# Patient Record
Sex: Male | Born: 1945 | Race: White | Hispanic: No | Marital: Married | Smoking: Former smoker
Health system: Southern US, Community
[De-identification: ages and names within clinical notes are randomized; demographics above are authoritative.]

## PROBLEM LIST (undated history)

## (undated) ENCOUNTER — Emergency Department (HOSPITAL_COMMUNITY): Discharge status: Against Medical Advice | Payer: Self-pay

## (undated) DIAGNOSIS — I2510 Atherosclerotic heart disease of native coronary artery without angina pectoris: Secondary | ICD-10-CM

## (undated) DIAGNOSIS — Z8719 Personal history of other diseases of the digestive system: Secondary | ICD-10-CM

## (undated) DIAGNOSIS — F0390 Unspecified dementia without behavioral disturbance: Secondary | ICD-10-CM

## (undated) DIAGNOSIS — D649 Anemia, unspecified: Secondary | ICD-10-CM

## (undated) DIAGNOSIS — Z8711 Personal history of peptic ulcer disease: Secondary | ICD-10-CM

## (undated) DIAGNOSIS — I255 Ischemic cardiomyopathy: Secondary | ICD-10-CM

## (undated) DIAGNOSIS — K219 Gastro-esophageal reflux disease without esophagitis: Secondary | ICD-10-CM

## (undated) HISTORY — PX: FRACTURE SURGERY: SHX138

## (undated) SURGERY — Surgical Case
Anesthesia: *Unknown

---

## 1983-03-21 HISTORY — PX: FOREARM FRACTURE SURGERY: SHX649

## 2006-07-03 ENCOUNTER — Ambulatory Visit: Payer: Self-pay | Admitting: Gastroenterology

## 2008-02-12 ENCOUNTER — Ambulatory Visit: Payer: Self-pay | Admitting: Internal Medicine

## 2008-02-15 ENCOUNTER — Ambulatory Visit: Payer: Self-pay | Admitting: Internal Medicine

## 2010-03-16 ENCOUNTER — Ambulatory Visit: Payer: Self-pay | Admitting: Internal Medicine

## 2010-09-13 ENCOUNTER — Ambulatory Visit: Payer: Self-pay | Admitting: Internal Medicine

## 2014-09-24 ENCOUNTER — Other Ambulatory Visit: Payer: Self-pay | Admitting: Internal Medicine

## 2014-09-24 DIAGNOSIS — R413 Other amnesia: Secondary | ICD-10-CM

## 2014-10-01 ENCOUNTER — Ambulatory Visit
Admission: RE | Admit: 2014-10-01 | Discharge: 2014-10-01 | Disposition: A | Discharge status: Home / Self Care | Payer: Commercial Managed Care - HMO | Source: Ambulatory Visit | Attending: Internal Medicine | Admitting: Internal Medicine

## 2014-10-01 DIAGNOSIS — G939 Disorder of brain, unspecified: Secondary | ICD-10-CM | POA: Insufficient documentation

## 2014-10-01 DIAGNOSIS — R413 Other amnesia: Secondary | ICD-10-CM | POA: Diagnosis present

## 2014-10-01 DIAGNOSIS — I6501 Occlusion and stenosis of right vertebral artery: Secondary | ICD-10-CM | POA: Insufficient documentation

## 2014-10-01 DIAGNOSIS — I739 Peripheral vascular disease, unspecified: Secondary | ICD-10-CM | POA: Diagnosis not present

## 2014-10-01 MED ORDER — GADOBENATE DIMEGLUMINE 529 MG/ML IV SOLN
20.0000 mL | Freq: Once | INTRAVENOUS | Status: AC | PRN
  Administered 2014-10-01: 18 mL via INTRAVENOUS

## 2014-10-16 ENCOUNTER — Ambulatory Visit (INDEPENDENT_AMBULATORY_CARE_PROVIDER_SITE_OTHER): Payer: Commercial Managed Care - HMO | Admitting: Urology

## 2014-10-16 VITALS — BP 160/89 | HR 77 | Ht 71.0 in | Wt 200.1 lb

## 2014-10-16 DIAGNOSIS — R312 Other microscopic hematuria: Secondary | ICD-10-CM

## 2014-10-16 DIAGNOSIS — R3129 Other microscopic hematuria: Secondary | ICD-10-CM

## 2014-10-16 DIAGNOSIS — R8290 Unspecified abnormal findings in urine: Secondary | ICD-10-CM

## 2014-10-16 NOTE — Progress Notes (Addendum)
H&P  Chief Complaint: Blood in urine   History of Present Illness: 69 year old referred for possible microscopic hematuria. He underwent a urinalysis with his primary care physician which showed positive blood on the tip but 0-3 red cells per high-powered field confirming no microscopic hematuria. He had a few bacteria in the urine but was not having any tissue area. He's never had gross hematuria. Exposure risk of remote smoking. Patient has no l bothersome ower urinary tract, weight loss or fatigue.  He denies prior urologic history. No history of kidney stones. No flank pain.  Prostate cancer screening-patient's father had prostate cancer. Patient recent PSA was 0.6.  History reviewed. No pertinent past medical history. Past Surgical History  Procedure Laterality Date  . Arm injury repair Right 1985    Home Medications:   (Not in a hospital admission) Allergies: No Known Allergies  Family History  Problem Relation Age of Onset  . Prostate cancer Father   . Bladder Cancer Neg Hx   . Kidney cancer Neg Hx    Social History:  reports that he has quit smoking. He does not have any smokeless tobacco history on file. He reports that he does not drink alcohol or use illicit drugs.  ROS: A complete review of systems was performed.  All systems are negative except for pertinent findings as noted. ROS  Positive for easy bruising, occasional back pain   Physical Exam:  Vital signs in last 24 hours: @ General:  Alert and oriented, No acute distress HEENT: Normocephalic, atraumatic Neck: No JVD or lymphadenopathy Cardiovascular: Regular rate and rhythm Lungs: Regular rate and effort Abdomen: Soft, nontender, nondistended, no abdominal masses Back: No CVA tenderness Extremities: No edema Neurologic: Grossly intact GU: Penis was normal without mass or lesion, the testicles were palpably normal. On digital rectal exam the prostate was smooth without hard area or nodules. He  had mild BPH.  Laboratory Data:  No results found for this or any previous visit (from the past 24 hour(s)). No results found for this or any previous visit (from the past 240 hour(s)). Creatinine: No results for input(s): CREATININE in the last 168 hours.  Impression/Assessment:  Abnormal urinalysis-false positive on dipstick. No microscopic hematuria. Repeat urinalysis today also showed 0-2 red blood cells per high-powered field which is normal.  Prostate cancer screening-normal PSA. DRE normal.  Plan:  -Patient may follow-up as needed.  Garett Tetzloff 10/16/2014, 3:39 PM

## 2014-10-17 LAB — URINALYSIS, COMPLETE
Bilirubin, UA: NEGATIVE
Glucose, UA: NEGATIVE
Ketones, UA: NEGATIVE
Leukocytes, UA: NEGATIVE
Nitrite, UA: NEGATIVE
Protein, UA: NEGATIVE
SPEC GRAV UA: 1.01 (ref 1.005–1.030)
UUROB: 0.2 mg/dL (ref 0.2–1.0)
pH, UA: 7 (ref 5.0–7.5)

## 2014-10-17 LAB — MICROSCOPIC EXAMINATION
Bacteria, UA: NONE SEEN
EPITHELIAL CELLS (NON RENAL): NONE SEEN /HPF (ref 0–10)
Renal Epithel, UA: NONE SEEN /hpf

## 2015-03-30 DIAGNOSIS — E538 Deficiency of other specified B group vitamins: Secondary | ICD-10-CM | POA: Diagnosis not present

## 2015-03-30 DIAGNOSIS — K625 Hemorrhage of anus and rectum: Secondary | ICD-10-CM | POA: Diagnosis not present

## 2015-03-30 DIAGNOSIS — R413 Other amnesia: Secondary | ICD-10-CM | POA: Diagnosis not present

## 2015-04-07 DIAGNOSIS — K625 Hemorrhage of anus and rectum: Secondary | ICD-10-CM | POA: Diagnosis not present

## 2015-04-07 DIAGNOSIS — K219 Gastro-esophageal reflux disease without esophagitis: Secondary | ICD-10-CM | POA: Diagnosis not present

## 2015-04-07 DIAGNOSIS — R1310 Dysphagia, unspecified: Secondary | ICD-10-CM | POA: Diagnosis not present

## 2015-05-05 ENCOUNTER — Encounter: Payer: Self-pay | Admitting: *Deleted

## 2015-05-06 ENCOUNTER — Ambulatory Visit: Payer: PPO | Admitting: Anesthesiology

## 2015-05-06 ENCOUNTER — Encounter
Admission: RE | Disposition: A | Discharge status: Home / Self Care | Payer: Self-pay | Source: Ambulatory Visit | Attending: Gastroenterology

## 2015-05-06 ENCOUNTER — Encounter: Payer: Self-pay | Admitting: Anesthesiology

## 2015-05-06 ENCOUNTER — Ambulatory Visit
Admission: RE | Admit: 2015-05-06 | Discharge: 2015-05-06 | Disposition: A | Discharge status: Home / Self Care | Payer: PPO | Source: Ambulatory Visit | Attending: Gastroenterology | Admitting: Gastroenterology

## 2015-05-06 DIAGNOSIS — Z87891 Personal history of nicotine dependence: Secondary | ICD-10-CM | POA: Insufficient documentation

## 2015-05-06 DIAGNOSIS — K625 Hemorrhage of anus and rectum: Secondary | ICD-10-CM | POA: Diagnosis not present

## 2015-05-06 DIAGNOSIS — M1990 Unspecified osteoarthritis, unspecified site: Secondary | ICD-10-CM | POA: Insufficient documentation

## 2015-05-06 DIAGNOSIS — R1310 Dysphagia, unspecified: Secondary | ICD-10-CM | POA: Diagnosis not present

## 2015-05-06 DIAGNOSIS — K210 Gastro-esophageal reflux disease with esophagitis: Secondary | ICD-10-CM | POA: Diagnosis not present

## 2015-05-06 DIAGNOSIS — K529 Noninfective gastroenteritis and colitis, unspecified: Secondary | ICD-10-CM | POA: Diagnosis not present

## 2015-05-06 DIAGNOSIS — K222 Esophageal obstruction: Secondary | ICD-10-CM | POA: Diagnosis not present

## 2015-05-06 DIAGNOSIS — K6389 Other specified diseases of intestine: Secondary | ICD-10-CM | POA: Insufficient documentation

## 2015-05-06 DIAGNOSIS — K5190 Ulcerative colitis, unspecified, without complications: Secondary | ICD-10-CM | POA: Diagnosis not present

## 2015-05-06 DIAGNOSIS — J45909 Unspecified asthma, uncomplicated: Secondary | ICD-10-CM | POA: Insufficient documentation

## 2015-05-06 DIAGNOSIS — K5289 Other specified noninfective gastroenteritis and colitis: Secondary | ICD-10-CM | POA: Diagnosis not present

## 2015-05-06 DIAGNOSIS — Z79899 Other long term (current) drug therapy: Secondary | ICD-10-CM | POA: Diagnosis not present

## 2015-05-06 DIAGNOSIS — K209 Esophagitis, unspecified: Secondary | ICD-10-CM | POA: Diagnosis not present

## 2015-05-06 DIAGNOSIS — K208 Other esophagitis: Secondary | ICD-10-CM | POA: Diagnosis not present

## 2015-05-06 HISTORY — PX: COLONOSCOPY WITH PROPOFOL: SHX5780

## 2015-05-06 HISTORY — DX: Gastro-esophageal reflux disease without esophagitis: K21.9

## 2015-05-06 HISTORY — PX: ESOPHAGOGASTRODUODENOSCOPY (EGD) WITH PROPOFOL: SHX5813

## 2015-05-06 SURGERY — COLONOSCOPY WITH PROPOFOL
Anesthesia: General

## 2015-05-06 MED ORDER — LIDOCAINE HCL (CARDIAC) 20 MG/ML IV SOLN
INTRAVENOUS | Status: DC | PRN
Start: 2015-05-06 — End: 2015-05-06
  Administered 2015-05-06: 30 mg via INTRAVENOUS

## 2015-05-06 MED ORDER — GLYCOPYRROLATE 0.2 MG/ML IJ SOLN
INTRAMUSCULAR | Status: DC | PRN
  Administered 2015-05-06: 0.1 mg via INTRAVENOUS

## 2015-05-06 MED ORDER — SODIUM CHLORIDE 0.9 % IV SOLN
INTRAVENOUS | Status: DC
  Administered 2015-05-06: 1000 mL via INTRAVENOUS

## 2015-05-06 MED ORDER — LIDOCAINE HCL (PF) 1 % IJ SOLN
2.0000 mL | Freq: Once | INTRAMUSCULAR | Status: AC
  Administered 2015-05-06: 0.03 mL via INTRADERMAL

## 2015-05-06 MED ORDER — SODIUM CHLORIDE 0.9 % IV SOLN
INTRAVENOUS | Status: DC
  Administered 2015-05-06: 08:00:00 via INTRAVENOUS

## 2015-05-06 MED ORDER — PROPOFOL 500 MG/50ML IV EMUL
INTRAVENOUS | Status: DC | PRN
  Administered 2015-05-06: 140 ug/kg/min via INTRAVENOUS

## 2015-05-06 MED ORDER — FENTANYL CITRATE (PF) 100 MCG/2ML IJ SOLN
INTRAMUSCULAR | Status: DC | PRN
  Administered 2015-05-06: 50 ug via INTRAVENOUS

## 2015-05-06 MED ORDER — LIDOCAINE HCL (PF) 1 % IJ SOLN
INTRAMUSCULAR | Status: AC
  Administered 2015-05-06: 0.03 mL via INTRADERMAL
  Filled 2015-05-06: qty 2

## 2015-05-06 MED ORDER — MIDAZOLAM HCL 2 MG/2ML IJ SOLN
INTRAMUSCULAR | Status: DC | PRN
  Administered 2015-05-06: 1 mg via INTRAVENOUS

## 2015-05-06 NOTE — Op Note (Signed)
[facility] Gastroenterology Patient Name: Jeff Key Procedure Date: 05/06/2015 9:30 AM MRN: [id number] Account #: [id number] Date of Birth: November 25, 1945 Admit Type: Outpatient Age: 70 Room: [facility] ENDO ROOM 4 Gender: Male Note Status: Finalized Procedure:            Colonoscopy Indications:          Rectal bleeding Providers:            Ezzard Standing. Bluford Kaufmann, MD Referring MD:         [name], MD (Referring MD) Medicines:            Monitored Anesthesia Care Complications:        No immediate complications. Procedure:            Pre-Anesthesia Assessment:                       - Prior to the procedure, a History and Physical was                        performed, and patient medications, allergies and                        sensitivities were reviewed. The patient's tolerance of                        previous anesthesia was reviewed.                       - The risks and benefits of the procedure and the                        sedation options and risks were discussed with the                        patient. All questions were answered and informed                        consent was obtained.                       - After reviewing the risks and benefits, the patient                        was deemed in satisfactory condition to undergo the                        procedure.                       After obtaining informed consent, the colonoscope was                        passed under direct vision. Throughout the procedure,                        the patient's blood pressure, pulse, and oxygen                        saturations were monitored continuously. The Olympus                        CF-H180AL colonoscope (  S#: [id number] ) was introduced                        through the anus and advanced to the the cecum,                        identified by appendiceal orifice and ileocecal valve.                        The colonoscopy was performed with difficulty due to                         significant looping. Successful completion of the                        procedure was aided by changing the patient to a supine                        position. The patient tolerated the procedure well. The                        quality of the bowel preparation was good. Findings:      A diffuse area of moderately congested, erythematous, friable (with       contact bleeding), inflamed and vascular-pattern-decreased mucosa was       found from rectum to sigmoid colon. Inflammation extends to 40cm. More       noticeable in sigmoid area compared to rectum.      The exam was otherwise without abnormality.      Multiple biopsies taken from inflammed area. Impression:           - Congested, erythematous, friable (with contact                        bleeding), inflamed and vascular-pattern-decreased                        mucosa from rectum to sigmoid colon.                       - The examination was otherwise normal.                       - No specimens collected. Recommendation:       - Discharge patient to home.                       - Await pathology results.                       - Continue present medications.                       - The findings and recommendations were discussed with                        the patient.                       - Start asacol 3 bid. F/U in GI office Procedure Code(s):    --- Professional ---  91478, Colonoscopy, flexible; diagnostic, including                        collection of specimen(s) by brushing or washing, when                        performed (separate procedure) Diagnosis Code(s):    --- Professional ---                       K92.2, Gastrointestinal hemorrhage, unspecified                       K52.9, Noninfective gastroenteritis and colitis,                        unspecified                       K63.89, Other specified diseases of intestine                       K62.5, Hemorrhage of anus and  rectum CPT copyright 2016 American Medical Association. All rights reserved. The codes documented in this report are preliminary and upon coder review may  be revised to meet current compliance requirements. Wallace Cullens, MD 05/06/2015 9:55:16 AM This report has been signed electronically. Number of Addenda: 0 Note Initiated On: 05/06/2015 9:30 AM Scope Withdrawal Time: 0 hours 2 minutes 56 seconds  Total Procedure Duration: 0 hours 9 minutes 47 seconds       [facility]

## 2015-05-06 NOTE — H&P (Signed)
    Primary Care Physician:  [name], MD Primary Gastroenterologist:  Dr. Bluford Kaufmann  Pre-Procedure History & Physical: HPI:  Jeff Key is a 70 y.o. male is here for an EGD/colonoscopy.  Past Medical History  Diagnosis Date  . Asthma   . GERD (gastroesophageal reflux disease)   . Arthritis     Past Surgical History  Procedure Laterality Date  . Arm injury repair Right 1985  . Fracture surgery      Prior to Admission medications   Medication Sig Start Date End Date Taking? Authorizing Provider  donepezil (ARICEPT) 5 MG tablet Take 5 mg by mouth at bedtime.   Yes Historical Provider, MD  hydrocortisone (ANUSOL-HC) 25 MG suppository Place 25 mg rectally 2 (two) times daily.   Yes Historical Provider, MD  omeprazole (PRILOSEC) 20 MG capsule Take 20 mg by mouth daily.   Yes Historical Provider, MD  donepezil (ARICEPT) 5 MG tablet Take by mouth. 09/24/14 09/24/15  Historical Provider, MD    Allergies as of 04/28/2015  . (No Known Allergies)    Family History  Problem Relation Age of Onset  . Prostate cancer Father   . Bladder Cancer Neg Hx   . Kidney cancer Neg Hx     Social History   Social History  . Marital Status: Married    Spouse Name: N/A  . Number of Children: N/A  . Years of Education: N/A   Occupational History  . Not on file.   Social History Main Topics  . Smoking status: Former Games developer  . Smokeless tobacco: Not on file  . Alcohol Use: No  . Drug Use: No  . Sexual Activity: Not on file   Other Topics Concern  . Not on file   Social History Narrative    Review of Systems: See HPI, otherwise negative ROS  Physical Exam: There were no vitals taken for this visit. General:   Alert,  pleasant and cooperative in NAD Head:  Normocephalic and atraumatic. Neck:  Supple; no masses or thyromegaly. Lungs:  Clear throughout to auscultation.    Heart:  Regular rate and rhythm. Abdomen:  Soft, nontender and nondistended. Normal bowel sounds, without  guarding, and without rebound.   Neurologic:  Alert and  oriented x4;  grossly normal neurologically.  Impression/Plan: MAOR MECKEL is here for an EGD/colonoscopy to be performed for rectal bleeding, GERD, and dysphagia.  Risks, benefits, limitations, and alternatives regarding  EGD/colonosocpy have been reviewed with the patient.  Questions have been answered.  All parties agreeable.   Rayanna Matusik, Ezzard Standing, MD  05/06/2015, 8:47 AM

## 2015-05-06 NOTE — Transfer of Care (Signed)
Immediate Anesthesia Transfer of Care Note  Patient: Jeff Key  Procedure(s) Performed: Procedure(s): COLONOSCOPY WITH PROPOFOL (N/A) ESOPHAGOGASTRODUODENOSCOPY (EGD) WITH PROPOFOL (N/A)  Patient Location: PACU  Anesthesia Type:General  Level of Consciousness: awake and sedated  Airway & Oxygen Therapy: Patient Spontanous Breathing and Patient connected to nasal cannula oxygen  Post-op Assessment: Report given to RN and Post -op Vital signs reviewed and stable  Post vital signs: Reviewed and stable  Last Vitals:  Filed Vitals:   05/06/15 0851  BP: 154/88  Pulse: 95  Temp: 36.6 C  Resp: 16    Complications: No apparent anesthesia complications

## 2015-05-06 NOTE — Anesthesia Postprocedure Evaluation (Signed)
Anesthesia Post Note  Patient: RAMI BUDHU  Procedure(s) Performed: Procedure(s) (LRB): COLONOSCOPY WITH PROPOFOL (N/A) ESOPHAGOGASTRODUODENOSCOPY (EGD) WITH PROPOFOL (N/A)  Patient location during evaluation: Endoscopy Anesthesia Type: General Level of consciousness: awake and alert Pain management: pain level controlled Vital Signs Assessment: post-procedure vital signs reviewed and stable Respiratory status: spontaneous breathing, nonlabored ventilation, respiratory function stable and patient connected to nasal cannula oxygen Cardiovascular status: blood pressure returned to baseline and stable Postop Assessment: no signs of nausea or vomiting Anesthetic complications: no    Last Vitals:  Filed Vitals:   05/06/15 1020 05/06/15 1030  BP: 135/93 135/93  Pulse: 69 73  Temp:    Resp: 14 17    Last Pain: There were no vitals filed for this visit.               Janes Colegrove S

## 2015-05-06 NOTE — Op Note (Signed)
[facility] Gastroenterology Patient Name: Jeff Key Procedure Date: 05/06/2015 9:31 AM MRN: [id number] Account #: [id number] Date of Birth: [birth year] Admit Type: Outpatient Age: 70 Room: [facility] ENDO ROOM 4 Gender: Male Note Status: Finalized Procedure:            Upper GI endoscopy Indications:          Dysphagia, Suspected esophageal reflux Providers:            Ezzard Standing. Bluford Kaufmann, MD Referring MD:         [name], MD (Referring MD) Medicines:            Monitored Anesthesia Care Complications:        No immediate complications. Procedure:            Pre-Anesthesia Assessment:                       - Prior to the procedure, a History and Physical was                        performed, and patient medications, allergies and                        sensitivities were reviewed. The patient's tolerance of                        previous anesthesia was reviewed.                       - The risks and benefits of the procedure and the                        sedation options and risks were discussed with the                        patient. All questions were answered and informed                        consent was obtained.                       - After reviewing the risks and benefits, the patient                        was deemed in satisfactory condition to undergo the                        procedure.                       After obtaining informed consent, the endoscope was                        passed under direct vision. Throughout the procedure,                        the patient's blood pressure, pulse, and oxygen                        saturations were monitored continuously. The Olympus  GIF-160 endoscope (S#. [id number]) was introduced through                        the mouth, and advanced to the second part of duodenum.                        The upper GI endoscopy was accomplished without                        difficulty. The  patient tolerated the procedure well. Findings:      LA Grade A (one or more mucosal breaks less than 5 mm, not extending       between tops of 2 mucosal folds) esophagitis was found at the       gastroesophageal junction. Biopsies were taken with a cold forceps for       histology.      One mild benign-appearing, intrinsic stenosis was found at the       gastroesophageal junction. And was traversed. The scope was withdrawn.       Dilation was performed with a Maloney dilator with mild resistance at 54       Fr.      The exam was otherwise without abnormality.      The entire examined stomach was normal.      The examined duodenum was normal. Impression:           - LA Grade A reflux esophagitis. Biopsied.                       - Benign-appearing esophageal stenosis. Dilated.                       - The examination was otherwise normal.                       - Normal stomach.                       - Normal examined duodenum. Recommendation:       - Discharge patient to home.                       - Observe patient's clinical course.                       - Continue present medications.                       - Await pathology results.                       - The findings and recommendations were discussed with                        the patient. Procedure Code(s):    --- Professional ---                       [phone], Esophagogastroduodenoscopy, flexible, transoral;                        with biopsy, single or multiple  43450, Dilation of esophagus, by unguided sound or                        bougie, single or multiple passes Diagnosis Code(s):    --- Professional ---                       K21.0, Gastro-esophageal reflux disease with esophagitis                       K22.2, Esophageal obstruction                       R13.10, Dysphagia, unspecified CPT copyright 2016 American Medical Association. All rights reserved. The codes documented in this report are  preliminary and upon coder review may  be revised to meet current compliance requirements. Wallace Cullens, MD 05/06/2015 9:40:08 AM This report has been signed electronically. Number of Addenda: 0 Note Initiated On: 05/06/2015 9:31 AM      [facility]

## 2015-05-06 NOTE — Anesthesia Procedure Notes (Signed)
Performed by: Ginger Carne Pre-anesthesia Checklist: Patient identified, Emergency Drugs available, Suction available, Patient being monitored and Timeout performed Patient Re-evaluated:Patient Re-evaluated prior to inductionOxygen Delivery Method: Nasal cannula Preoxygenation: Pre-oxygenation with 100% oxygen Intubation Type: IV induction Airway Equipment and Method: Bite block Placement Confirmation: positive ETCO2 and CO2 detector

## 2015-05-06 NOTE — Anesthesia Preprocedure Evaluation (Signed)
Anesthesia Evaluation  Patient identified by MRN, date of birth, ID band Patient awake    Reviewed: Allergy & Precautions, NPO status , Patient's Chart, lab work & pertinent test results, reviewed documented beta blocker date and time   Airway Mallampati: II  TM Distance: >3 FB     Dental  (+) Chipped   Pulmonary asthma , former smoker,           Cardiovascular      Neuro/Psych    GI/Hepatic GERD  ,  Endo/Other    Renal/GU      Musculoskeletal  (+) Arthritis ,   Abdominal   Peds  Hematology   Anesthesia Other Findings   Reproductive/Obstetrics                             Anesthesia Physical Anesthesia Plan  ASA: III  Anesthesia Plan: General   Post-op Pain Management:    Induction: Intravenous  Airway Management Planned: Nasal Cannula  Additional Equipment:   Intra-op Plan:   Post-operative Plan:   Informed Consent: I have reviewed the patients History and Physical, chart, labs and discussed the procedure including the risks, benefits and alternatives for the proposed anesthesia with the patient or authorized representative who has indicated his/her understanding and acceptance.     Plan Discussed with: CRNA  Anesthesia Plan Comments:         Anesthesia Quick Evaluation

## 2015-05-07 LAB — SURGICAL PATHOLOGY

## 2015-05-10 ENCOUNTER — Encounter: Payer: Self-pay | Admitting: Gastroenterology

## 2015-05-25 DIAGNOSIS — K51911 Ulcerative colitis, unspecified with rectal bleeding: Secondary | ICD-10-CM | POA: Diagnosis not present

## 2015-07-27 DIAGNOSIS — E538 Deficiency of other specified B group vitamins: Secondary | ICD-10-CM | POA: Diagnosis not present

## 2015-07-27 DIAGNOSIS — R413 Other amnesia: Secondary | ICD-10-CM | POA: Diagnosis not present

## 2015-07-27 DIAGNOSIS — J309 Allergic rhinitis, unspecified: Secondary | ICD-10-CM | POA: Diagnosis not present

## 2015-07-27 DIAGNOSIS — K51311 Ulcerative (chronic) rectosigmoiditis with rectal bleeding: Secondary | ICD-10-CM | POA: Diagnosis not present

## 2015-11-25 DIAGNOSIS — K219 Gastro-esophageal reflux disease without esophagitis: Secondary | ICD-10-CM | POA: Diagnosis not present

## 2015-11-25 DIAGNOSIS — K51911 Ulcerative colitis, unspecified with rectal bleeding: Secondary | ICD-10-CM | POA: Diagnosis not present

## 2015-11-25 DIAGNOSIS — K625 Hemorrhage of anus and rectum: Secondary | ICD-10-CM | POA: Diagnosis not present

## 2015-11-28 ENCOUNTER — Emergency Department: Payer: PPO

## 2015-11-28 ENCOUNTER — Encounter: Payer: Self-pay | Admitting: *Deleted

## 2015-11-28 ENCOUNTER — Inpatient Hospital Stay
Admission: EM | Admit: 2015-11-28 | Discharge: 2015-11-30 | DRG: 871 | Disposition: A | Discharge status: Home / Self Care | Payer: PPO | Attending: Internal Medicine | Admitting: Internal Medicine

## 2015-11-28 DIAGNOSIS — K219 Gastro-esophageal reflux disease without esophagitis: Secondary | ICD-10-CM | POA: Diagnosis present

## 2015-11-28 DIAGNOSIS — J189 Pneumonia, unspecified organism: Secondary | ICD-10-CM | POA: Diagnosis present

## 2015-11-28 DIAGNOSIS — Z79899 Other long term (current) drug therapy: Secondary | ICD-10-CM

## 2015-11-28 DIAGNOSIS — Z7982 Long term (current) use of aspirin: Secondary | ICD-10-CM

## 2015-11-28 DIAGNOSIS — G9340 Encephalopathy, unspecified: Secondary | ICD-10-CM | POA: Diagnosis not present

## 2015-11-28 DIAGNOSIS — Z8042 Family history of malignant neoplasm of prostate: Secondary | ICD-10-CM

## 2015-11-28 DIAGNOSIS — E871 Hypo-osmolality and hyponatremia: Secondary | ICD-10-CM | POA: Diagnosis not present

## 2015-11-28 DIAGNOSIS — R410 Disorientation, unspecified: Secondary | ICD-10-CM

## 2015-11-28 DIAGNOSIS — F0390 Unspecified dementia without behavioral disturbance: Secondary | ICD-10-CM | POA: Diagnosis present

## 2015-11-28 DIAGNOSIS — R0682 Tachypnea, not elsewhere classified: Secondary | ICD-10-CM | POA: Diagnosis not present

## 2015-11-28 DIAGNOSIS — R509 Fever, unspecified: Secondary | ICD-10-CM | POA: Diagnosis not present

## 2015-11-28 DIAGNOSIS — E860 Dehydration: Secondary | ICD-10-CM | POA: Diagnosis present

## 2015-11-28 DIAGNOSIS — R000 Tachycardia, unspecified: Secondary | ICD-10-CM | POA: Diagnosis present

## 2015-11-28 DIAGNOSIS — A419 Sepsis, unspecified organism: Principal | ICD-10-CM | POA: Diagnosis present

## 2015-11-28 DIAGNOSIS — J45909 Unspecified asthma, uncomplicated: Secondary | ICD-10-CM | POA: Diagnosis not present

## 2015-11-28 DIAGNOSIS — Z87891 Personal history of nicotine dependence: Secondary | ICD-10-CM

## 2015-11-28 DIAGNOSIS — M1990 Unspecified osteoarthritis, unspecified site: Secondary | ICD-10-CM | POA: Diagnosis not present

## 2015-11-28 LAB — CBC WITH DIFFERENTIAL/PLATELET
BASOS PCT: 1 %
Basophils Absolute: 0.1 10*3/uL (ref 0–0.1)
EOS PCT: 9 %
Eosinophils Absolute: 1.1 10*3/uL — ABNORMAL HIGH (ref 0–0.7)
HEMATOCRIT: 36.5 % — AB (ref 40.0–52.0)
Hemoglobin: 12.5 g/dL — ABNORMAL LOW (ref 13.0–18.0)
Lymphocytes Relative: 12 %
Lymphs Abs: 1.4 10*3/uL (ref 1.0–3.6)
MCH: 27.7 pg (ref 26.0–34.0)
MCHC: 34.1 g/dL (ref 32.0–36.0)
MCV: 81.3 fL (ref 80.0–100.0)
MONO ABS: 1.9 10*3/uL — AB (ref 0.2–1.0)
MONOS PCT: 16 %
NEUTROS ABS: 7.2 10*3/uL — AB (ref 1.4–6.5)
Neutrophils Relative %: 62 %
PLATELETS: 172 10*3/uL (ref 150–440)
RBC: 4.49 MIL/uL (ref 4.40–5.90)
RDW: 15.2 % — AB (ref 11.5–14.5)
WBC: 11.8 10*3/uL — ABNORMAL HIGH (ref 3.8–10.6)

## 2015-11-28 LAB — COMPREHENSIVE METABOLIC PANEL
ALBUMIN: 3.7 g/dL (ref 3.5–5.0)
ALK PHOS: 79 U/L (ref 38–126)
ALT: 14 U/L — AB (ref 17–63)
ANION GAP: 8 (ref 5–15)
AST: 16 U/L (ref 15–41)
BILIRUBIN TOTAL: 0.5 mg/dL (ref 0.3–1.2)
BUN: 12 mg/dL (ref 6–20)
CALCIUM: 8.6 mg/dL — AB (ref 8.9–10.3)
CO2: 26 mmol/L (ref 22–32)
Chloride: 92 mmol/L — ABNORMAL LOW (ref 101–111)
Creatinine, Ser: 0.97 mg/dL (ref 0.61–1.24)
GFR calc Af Amer: >60 mL/min (ref >60)
GFR calc non Af Amer: >60 mL/min (ref >60)
GLUCOSE: 107 mg/dL — AB (ref 65–99)
Potassium: 3.6 mmol/L (ref 3.5–5.1)
SODIUM: 126 mmol/L — AB (ref 135–145)
Total Protein: 8.1 g/dL (ref 6.5–8.1)

## 2015-11-28 LAB — LACTIC ACID, PLASMA: Lactic Acid, Venous: 0.9 mmol/L (ref 0.5–1.9)

## 2015-11-28 LAB — TROPONIN I

## 2015-11-28 MED ORDER — ACETAMINOPHEN 325 MG PO TABS
650.0000 mg | ORAL_TABLET | Freq: Once | ORAL | Status: AC
  Administered 2015-11-28: 650 mg via ORAL
  Filled 2015-11-28: qty 2

## 2015-11-28 MED ORDER — SODIUM CHLORIDE 0.9 % IV BOLUS (SEPSIS)
1000.0000 mL | Freq: Once | INTRAVENOUS | Status: AC
  Administered 2015-11-28: 1000 mL via INTRAVENOUS

## 2015-11-28 NOTE — ED Triage Notes (Signed)
Wife states pt is more confused for 2-3 days.  Pt also has unsteady gait for 2-3 days.  Pt denies h/a.  Nausea yesterday.

## 2015-11-28 NOTE — ED Provider Notes (Signed)
[facility] Emergency Department Provider Note   ____________________________________________   First MD Initiated Contact with Patient 11/28/15 2308     (approximate)  I have reviewed the triage vital signs and the nursing notes.   HISTORY  Chief Complaint Altered Mental Status and Code Sepsis    HPI Jeff Key is a 70 y.o. male who comes into the hospital today with a fever, mild confusion and being off balance. The patient's wife reports that he came home from church around 7:30 because he was acting a bit strange and he looked flushed. She reports that they took his temperature and it was 102 and then 101. He did not receive any medicine at home but came right to the hospital. His wife reports that he had some mild confusion that started on Friday. She thought it was due to his medication so she took him off of it. He started on some sulfasalazine for colitis which she had 6 months ago. The patient is also on Aricept for some forgetfulness but has no specific diagnosis of dementia. He's had a mild cough with no runny nose and some nausea yesterday morning. The patient has had no vomiting no diarrhea and no abdominal pain. He's been urinating well with no headache. The patient's wife reports that he has been slow to respond which is not normal for him. She was concerned so they brought him into the hospital for evaluation.   Past Medical History:  Diagnosis Date  . Arthritis   . Asthma   . GERD (gastroesophageal reflux disease)     Patient Active Problem List   Diagnosis Date Noted  . Abnormal urinalysis 10/16/2014    Past Surgical History:  Procedure Laterality Date  . ARM Injury Repair Right 1985  . COLONOSCOPY WITH PROPOFOL N/A 05/06/2015   Procedure: COLONOSCOPY WITH PROPOFOL;  Surgeon: Wallace Cullens, MD;  Location: [facility] ENDOSCOPY;  Service: Gastroenterology;  Laterality: N/A;  . ESOPHAGOGASTRODUODENOSCOPY (EGD) WITH PROPOFOL N/A 05/06/2015   Procedure: ESOPHAGOGASTRODUODENOSCOPY (EGD) WITH PROPOFOL;  Surgeon: Wallace Cullens, MD;  Location: [facility] ENDOSCOPY;  Service: Gastroenterology;  Laterality: N/A;  . FRACTURE SURGERY      Prior to Admission medications   Medication Sig Start Date End Date Taking? Authorizing Provider  aspirin EC 81 MG tablet Take 81 mg by mouth daily.   Yes Historical Provider, MD  donepezil (ARICEPT) 5 MG tablet Take 5 mg by mouth at bedtime.   Yes Historical Provider, MD  omeprazole (PRILOSEC) 20 MG capsule Take 20 mg by mouth daily.   Yes Historical Provider, MD  sulfaSALAzine (AZULFIDINE) 500 MG EC tablet Take 1,000 mg by mouth 2 (two) times daily.   Yes Historical Provider, MD  vitamin B-12 (CYANOCOBALAMIN) 1000 MCG tablet Take 1,000 mcg by mouth daily.   Yes Historical Provider, MD    Allergies Review of patient's allergies indicates no known allergies.  Family History  Problem Relation Age of Onset  . Prostate cancer Father   . Bladder Cancer Neg Hx   . Kidney cancer Neg Hx     Social History Social History  Substance Use Topics  . Smoking status: Former Games developer  . Smokeless tobacco: Never Used  . Alcohol use No    Review of Systems Constitutional:  fever/chills Eyes: No visual changes. ENT: No sore throat. Cardiovascular: Denies chest pain. Respiratory: mild cough Gastrointestinal: No abdominal pain.  No nausea, no vomiting.  No diarrhea.  No constipation. Genitourinary: Negative for dysuria. Musculoskeletal: Negative for  back pain. Skin: Negative for rash. Neurological: Negative for headaches, focal weakness or numbness.  10-point ROS otherwise negative.  ____________________________________________   PHYSICAL EXAM:  VITAL SIGNS: ED Triage Vitals  Enc Vitals Group     BP 11/28/15 2247 (!) 154/81     Pulse Rate 11/28/15 2247 83     Resp 11/28/15 2247 (!) 22     Temp 11/28/15 2247 (!) 102.2 F (39 C)     Temp Source 11/28/15 2247 Oral     SpO2 11/28/15 2247 97 %     Weight  11/28/15 2249 198 lb (89.8 kg)     Height 11/28/15 2249 5\' 11"  (1.803 m)     Head Circumference --      Peak Flow --      Pain Score --      Pain Loc --      Pain Edu? --      Excl. in GC? --     Constitutional: Alert and oriented. Well appearing and in mild distress. Eyes: Conjunctivae are normal. PERRL. EOMI. Head: Atraumatic. Nose: No congestion/rhinnorhea. Mouth/Throat: Mucous membranes are moist.  Oropharynx non-erythematous. Cardiovascular: Normal rate, regular rhythm. Grossly normal heart sounds.  Good peripheral circulation. Respiratory: Normal respiratory effort.  No retractions. crackles in upper lobes bilaterally Gastrointestinal: Soft and nontender. No distention. Positive bowel sounds Musculoskeletal: No lower extremity tenderness nor edema.   Neurologic:  Normal speech and language. Cranial nerves II through XII grossly intact with no focal motor or neuro deficits Skin:  Skin is warm, dry and intact.  Psychiatric: Mood and affect are normal.   ____________________________________________   LABS (all labs ordered are listed, but only abnormal results are displayed)  Labs Reviewed  COMPREHENSIVE METABOLIC PANEL - Abnormal; Notable for the following:       Result Value   Sodium 126 (*)    Chloride 92 (*)    Glucose, Bld 107 (*)    Calcium 8.6 (*)    ALT 14 (*)    All other components within normal limits  CBC WITH DIFFERENTIAL/PLATELET - Abnormal; Notable for the following:    WBC 11.8 (*)    Hemoglobin 12.5 (*)    HCT 36.5 (*)    RDW 15.2 (*)    Neutro Abs 7.2 (*)    Monocytes Absolute 1.9 (*)    Eosinophils Absolute 1.1 (*)    All other components within normal limits  CULTURE, BLOOD (ROUTINE X 2)  CULTURE, BLOOD (ROUTINE X 2)  URINE CULTURE  LACTIC ACID, PLASMA  TROPONIN I  LACTIC ACID, PLASMA  URINALYSIS COMPLETEWITH MICROSCOPIC ([facility] ONLY)   ____________________________________________  EKG  ED ECG REPORT I, Rebecka ApleyWebster,  Mylia Pondexter P, the  attending physician, personally viewed and interpreted this ECG.   Date: 11/28/2015  EKG Time: 2302  Rate: 83  Rhythm: normal sinus rhythm  Axis: normal  Intervals:none  ST&T Change: none  ____________________________________________  RADIOLOGY  CXR CT head ____________________________________________   PROCEDURES  Procedure(s) performed: None  Procedures  Critical Care performed: No  ____________________________________________   INITIAL IMPRESSION / ASSESSMENT AND PLAN / ED COURSE  Pertinent labs & imaging results that were available during my care of the patient were reviewed by me and considered in my medical decision making (see chart for details).  This is a 70 year old male who comes into the hospital today with a fever and some altered mental status. The patient was seen and had a code sepsis called on him on his arrival. The patient is  not tachycardic and his blood pressure is unremarkable so he is not in septic shock. We will check some blood work and give the patient a liter of normal saline and 650 of Tylenol. The patient has no focality at this point as to the cause of his fever I will continue to assess the patient.  Clinical Course  Value Comment By Time  DG Chest Port 1 View Questionable ill-defined vague upper lobe opacity. This may be pneumonia versus artifact. PA and lateral views recommended when patient is able.   Rebecka Apley, MD 09/11 [phone]  CT Head Wo Contrast No acute intracranial findings. There is moderate generalized atrophy and chronic appearing white matter hypodensities which likely represent small vessel ischemic disease.   Rebecka Apley, MD 09/11 [phone]   ED Sepsis - Repeat Assessment   Performed at:    11/29/15 0110  Last Vitals:    Blood pressure 135/68, pulse 80, temperature 98.6 F (37 C), temperature source Oral, resp. rate 12, height 5\' 11"  (1.803 m), weight 198 lb (89.8 kg), SpO2 94 %.  Heart:                 RRR no  m/r/g  Lungs:     Crackles in bilateral upper lobes  Capillary Refill:              Less than 2 seconds  Peripheral Pulse (include location): Radial 2+   Skin (include color):   Pink, warm  This is a 70 year old male who comes into the hospital today with some confusion and decreased balance. The patient also has some fever. It looks that the patient may have some pneumonia based on his chest x-ray. I will give him some ceftriaxone and azithromycin. I also gave him a liter of normal saline and I will admit him to the hospitalist service. ____________________________________________   FINAL CLINICAL IMPRESSION(S) / ED DIAGNOSES  Final diagnoses:  Disorientation  Fever, unspecified fever cause  Hyponatremia  Sepsis, due to unspecified organism [facility])      NEW MEDICATIONS STARTED DURING THIS VISIT:  New Prescriptions   No medications on file     Note:  This document was prepared using Dragon voice recognition software and may include unintentional dictation errors.    Rebecka Apley, MD 11/29/15 [phone]

## 2015-11-28 NOTE — ED Notes (Signed)
Patient transported to CT 

## 2015-11-29 DIAGNOSIS — E860 Dehydration: Secondary | ICD-10-CM | POA: Diagnosis not present

## 2015-11-29 DIAGNOSIS — Z8042 Family history of malignant neoplasm of prostate: Secondary | ICD-10-CM | POA: Diagnosis not present

## 2015-11-29 DIAGNOSIS — R410 Disorientation, unspecified: Secondary | ICD-10-CM | POA: Diagnosis not present

## 2015-11-29 DIAGNOSIS — Z7982 Long term (current) use of aspirin: Secondary | ICD-10-CM | POA: Diagnosis not present

## 2015-11-29 DIAGNOSIS — K219 Gastro-esophageal reflux disease without esophagitis: Secondary | ICD-10-CM | POA: Diagnosis not present

## 2015-11-29 DIAGNOSIS — R000 Tachycardia, unspecified: Secondary | ICD-10-CM | POA: Diagnosis not present

## 2015-11-29 DIAGNOSIS — M1990 Unspecified osteoarthritis, unspecified site: Secondary | ICD-10-CM | POA: Diagnosis not present

## 2015-11-29 DIAGNOSIS — Z87891 Personal history of nicotine dependence: Secondary | ICD-10-CM | POA: Diagnosis not present

## 2015-11-29 DIAGNOSIS — A419 Sepsis, unspecified organism: Secondary | ICD-10-CM | POA: Diagnosis present

## 2015-11-29 DIAGNOSIS — F0390 Unspecified dementia without behavioral disturbance: Secondary | ICD-10-CM | POA: Diagnosis not present

## 2015-11-29 DIAGNOSIS — R0682 Tachypnea, not elsewhere classified: Secondary | ICD-10-CM | POA: Diagnosis not present

## 2015-11-29 DIAGNOSIS — Z79899 Other long term (current) drug therapy: Secondary | ICD-10-CM | POA: Diagnosis not present

## 2015-11-29 DIAGNOSIS — E871 Hypo-osmolality and hyponatremia: Secondary | ICD-10-CM | POA: Diagnosis not present

## 2015-11-29 DIAGNOSIS — J189 Pneumonia, unspecified organism: Secondary | ICD-10-CM | POA: Diagnosis not present

## 2015-11-29 DIAGNOSIS — R509 Fever, unspecified: Secondary | ICD-10-CM | POA: Diagnosis not present

## 2015-11-29 DIAGNOSIS — G9340 Encephalopathy, unspecified: Secondary | ICD-10-CM | POA: Diagnosis not present

## 2015-11-29 DIAGNOSIS — J45909 Unspecified asthma, uncomplicated: Secondary | ICD-10-CM | POA: Diagnosis not present

## 2015-11-29 LAB — LACTIC ACID, PLASMA: Lactic Acid, Venous: 0.7 mmol/L (ref 0.5–1.9)

## 2015-11-29 LAB — URINALYSIS COMPLETE WITH MICROSCOPIC (ARMC ONLY)
BACTERIA UA: NONE SEEN
Bilirubin Urine: NEGATIVE
GLUCOSE, UA: NEGATIVE mg/dL
Ketones, ur: NEGATIVE mg/dL
Leukocytes, UA: NEGATIVE
Nitrite: NEGATIVE
PROTEIN: NEGATIVE mg/dL
SPECIFIC GRAVITY, URINE: 1.004 — AB (ref 1.005–1.030)
Squamous Epithelial / LPF: NONE SEEN
pH: 6 (ref 5.0–8.0)

## 2015-11-29 LAB — TSH: TSH: 1.361 u[IU]/mL (ref 0.350–4.500)

## 2015-11-29 LAB — HEMOGLOBIN A1C: Hgb A1c MFr Bld: 6.3 % — ABNORMAL HIGH (ref 4.0–6.0)

## 2015-11-29 MED ORDER — SULFASALAZINE 500 MG PO TBEC
1000.0000 mg | DELAYED_RELEASE_TABLET | Freq: Two times a day (BID) | ORAL | Status: DC
  Filled 2015-11-29 (×2): qty 2

## 2015-11-29 MED ORDER — DONEPEZIL HCL 5 MG PO TABS
5.0000 mg | ORAL_TABLET | Freq: Every day | ORAL | Status: DC
  Administered 2015-11-29: 5 mg via ORAL
  Filled 2015-11-29: qty 1

## 2015-11-29 MED ORDER — DOCUSATE SODIUM 100 MG PO CAPS
100.0000 mg | ORAL_CAPSULE | Freq: Two times a day (BID) | ORAL | Status: DC
  Administered 2015-11-29 – 2015-11-30 (×4): 100 mg via ORAL
  Filled 2015-11-29 (×4): qty 1

## 2015-11-29 MED ORDER — SODIUM CHLORIDE 0.9% FLUSH
3.0000 mL | Freq: Two times a day (BID) | INTRAVENOUS | Status: DC
  Administered 2015-11-29 (×2): 3 mL via INTRAVENOUS

## 2015-11-29 MED ORDER — CEFTRIAXONE SODIUM 1 G IJ SOLR
1.0000 g | INTRAMUSCULAR | Status: DC
  Administered 2015-11-29: 1 g via INTRAVENOUS
  Filled 2015-11-29 (×2): qty 10

## 2015-11-29 MED ORDER — DEXTROSE 5 % IV SOLN
1.0000 g | INTRAVENOUS | Status: DC
  Filled 2015-11-29: qty 10

## 2015-11-29 MED ORDER — ONDANSETRON HCL 4 MG/2ML IJ SOLN: 4.0000 mg | Freq: Four times a day (QID) | INTRAMUSCULAR | Status: DC | PRN

## 2015-11-29 MED ORDER — PANTOPRAZOLE SODIUM 40 MG PO TBEC
40.0000 mg | DELAYED_RELEASE_TABLET | Freq: Every day | ORAL | Status: DC
  Administered 2015-11-29 – 2015-11-30 (×2): 40 mg via ORAL
  Filled 2015-11-29 (×2): qty 1

## 2015-11-29 MED ORDER — DEXTROSE 5 % IV SOLN
1.0000 g | Freq: Once | INTRAVENOUS | Status: AC
  Administered 2015-11-29: 1 g via INTRAVENOUS
  Filled 2015-11-29: qty 10

## 2015-11-29 MED ORDER — ENOXAPARIN SODIUM 40 MG/0.4ML ~~LOC~~ SOLN
40.0000 mg | SUBCUTANEOUS | Status: DC
  Administered 2015-11-29: 40 mg via SUBCUTANEOUS
  Filled 2015-11-29: qty 0.4

## 2015-11-29 MED ORDER — ACETAMINOPHEN 325 MG PO TABS: 650.0000 mg | ORAL_TABLET | Freq: Four times a day (QID) | ORAL | Status: DC | PRN

## 2015-11-29 MED ORDER — OXYCODONE HCL 5 MG PO TABS: 5.0000 mg | ORAL_TABLET | ORAL | Status: DC | PRN

## 2015-11-29 MED ORDER — AZITHROMYCIN 250 MG PO TABS
250.0000 mg | ORAL_TABLET | Freq: Every day | ORAL | Status: DC
  Administered 2015-11-29 – 2015-11-30 (×2): 250 mg via ORAL
  Filled 2015-11-29 (×2): qty 1

## 2015-11-29 MED ORDER — ONDANSETRON HCL 4 MG PO TABS: 4.0000 mg | ORAL_TABLET | Freq: Four times a day (QID) | ORAL | Status: DC | PRN

## 2015-11-29 MED ORDER — SODIUM CHLORIDE 0.9 % IV SOLN
INTRAVENOUS | Status: DC
  Administered 2015-11-29 (×2): via INTRAVENOUS

## 2015-11-29 MED ORDER — VITAMIN B-12 1000 MCG PO TABS
1000.0000 ug | ORAL_TABLET | Freq: Every day | ORAL | Status: DC
  Administered 2015-11-29 – 2015-11-30 (×2): 1000 ug via ORAL
  Filled 2015-11-29 (×2): qty 1

## 2015-11-29 MED ORDER — AZITHROMYCIN 500 MG IV SOLR
500.0000 mg | Freq: Once | INTRAVENOUS | Status: AC
  Administered 2015-11-29: 500 mg via INTRAVENOUS
  Filled 2015-11-29: qty 500

## 2015-11-29 MED ORDER — ASPIRIN EC 81 MG PO TBEC
81.0000 mg | DELAYED_RELEASE_TABLET | Freq: Every day | ORAL | Status: DC
  Administered 2015-11-29 – 2015-11-30 (×2): 81 mg via ORAL
  Filled 2015-11-29 (×2): qty 1

## 2015-11-29 MED ORDER — ACETAMINOPHEN 650 MG RE SUPP: 650.0000 mg | Freq: Four times a day (QID) | RECTAL | Status: DC | PRN

## 2015-11-29 NOTE — Progress Notes (Signed)
[facility] [place] [facility]                                                                                                                                                                                            Patient Demographics   Jeff Key, is a 70 y.o. male, DOB - February 01, 1946, WUJ:[id number]  Admit date - 11/28/2015   Admitting Physician Arnaldo Natal, MD  Outpatient Primary MD for the patient is [name], MD   LOS - 0  Subjective:Called by nurse due to family requesting to speak to a physician Patient admitted earlier with fever and confusion His mental status is improved fevers also subsided     Review of Systems:   CONSTITUTIONAL: No documented fever. No fatigue, weakness. No weight gain, no weight loss.  EYES: No blurry or double vision.  ENT: No tinnitus. No postnasal drip. No redness of the oropharynx.  RESPIRATORY: No cough, no wheeze, no hemoptysis. No dyspnea.  CARDIOVASCULAR: No chest pain. No orthopnea. No palpitations. No syncope.  GASTROINTESTINAL: No nausea, no vomiting or diarrhea. No abdominal pain. No melena or hematochezia.  GENITOURINARY: No dysuria or hematuria.  ENDOCRINE: No polyuria or nocturia. No heat or cold intolerance.  HEMATOLOGY: No anemia. No bruising. No bleeding.  INTEGUMENTARY: No rashes. No lesions.  MUSCULOSKELETAL: No arthritis. No swelling. No gout.  NEUROLOGIC: No numbness, tingling, or ataxia. No seizure-type activity.  PSYCHIATRIC: No anxiety. No insomnia. No ADD.    Vitals:   Vitals:   11/29/15 0148 11/29/15 0254 11/29/15 0342 11/29/15 1321  BP: (!) 152/88 (!) 149/89 (!) 146/71 (!) 141/73  Pulse: 81 79 75 79  Resp: (!) 22 20 18 18   Temp:   98.1 F (36.7 C) 98.3 F (36.8 C)  TempSrc:   Oral Oral  SpO2: 97% 97% 98% 99%  Weight:   195 lb 12.8 oz (88.8 kg)   Height:   5\' 11"  (1.803 m)     Wt Readings from Last 3 Encounters:  11/29/15 195 lb 12.8 oz (88.8 kg)  05/06/15  180 lb (81.6 kg)  10/16/14 200 lb 1.6 oz (90.8 kg)     Intake/Output Summary (Last 24 hours) at 11/29/15 1408 Last data filed at 11/29/15 1200  Gross per 24 hour  Intake          1372.19 ml  Output             2800 ml  Net         -1427.81 ml    Physical Exam:   GENERAL: Pleasant-appearing in no apparent distress.  HEAD, EYES, EARS, NOSE AND  THROAT: Atraumatic, normocephalic. Extraocular muscles are intact. Pupils equal and reactive to light. Sclerae anicteric. No conjunctival injection. No oro-pharyngeal erythema.  NECK: Supple. There is no jugular venous distention. No bruits, no lymphadenopathy, no thyromegaly.  HEART: Regular rate and rhythm,. No murmurs, no rubs, no clicks.  LUNGS: Clear to auscultation bilaterally. No rales or rhonchi. No wheezes.  ABDOMEN: Soft, flat, nontender, nondistended. Has good bowel sounds. No hepatosplenomegaly appreciated.  EXTREMITIES: No evidence of any cyanosis, clubbing, or peripheral edema.  +2 pedal and radial pulses bilaterally.  NEUROLOGIC: The patient is alert, awake, and oriented x3 with no focal motor or sensory deficits appreciated bilaterally.  SKIN: Moist and warm with no rashes appreciated.  Psych: Not anxious, depressed LN: No inguinal LN enlargement    Antibiotics   Anti-infectives    Start     Dose/Rate Route Frequency Ordered Stop   11/29/15 2200  cefTRIAXone (ROCEPHIN) 1 g in dextrose 5 % 50 mL IVPB     1 g 100 mL/hr over 30 Minutes Intravenous Every 24 hours 11/29/15 1009     11/29/15 1200  cefTRIAXone (ROCEPHIN) 1 g in dextrose 5 % 50 mL IVPB  Status:  Discontinued     1 g 100 mL/hr over 30 Minutes Intravenous Every 24 hours 11/29/15 0337 11/29/15 1009   11/29/15 1000  azithromycin (ZITHROMAX) tablet 250 mg     250 mg Oral Daily 11/29/15 0337     11/29/15 0045  cefTRIAXone (ROCEPHIN) 1 g in dextrose 5 % 50 mL IVPB     1 g 100 mL/hr over 30 Minutes Intravenous  Once 11/29/15 0036 11/29/15 0150   11/29/15 0045   azithromycin (ZITHROMAX) 500 mg in dextrose 5 % 250 mL IVPB     500 mg 250 mL/hr over 60 Minutes Intravenous  Once 11/29/15 0036 11/29/15 0232      Medications   Scheduled Meds: . aspirin EC  81 mg Oral Daily  . azithromycin  250 mg Oral Daily  . cefTRIAXone (ROCEPHIN)  IV  1 g Intravenous Q24H  . docusate sodium  100 mg Oral BID  . donepezil  5 mg Oral QHS  . enoxaparin (LOVENOX) injection  40 mg Subcutaneous Q24H  . pantoprazole  40 mg Oral Daily  . sodium chloride flush  3 mL Intravenous Q12H  . vitamin B-12  1,000 mcg Oral Daily   Continuous Infusions: . sodium chloride 75 mL/hr at 11/29/15 1135   PRN Meds:.acetaminophen **OR** acetaminophen, ondansetron **OR** ondansetron (ZOFRAN) IV, oxyCODONE   Data Review:   Micro Results Recent Results (from the past 240 hour(s))  Blood Culture (routine x 2)     Status: None (Preliminary result)   Collection Time: 11/28/15 11:08 PM  Result Value Ref Range Status   Specimen Description BLOOD RIGHT ASSIST CONTROL  Final   Special Requests   Final    BOTTLES DRAWN AEROBIC AND ANAEROBIC 12CCAERO,8CCANA   Culture NO GROWTH < 12 HOURS  Final   Report Status PENDING  Incomplete  Blood Culture (routine x 2)     Status: None (Preliminary result)   Collection Time: 11/29/15 12:40 AM  Result Value Ref Range Status   Specimen Description BLOOD RIGHT WRIST  Final   Special Requests   Final    BOTTLES DRAWN AEROBIC AND ANAEROBIC 15CCAERO,12CCANA   Culture NO GROWTH < 12 HOURS  Final   Report Status PENDING  Incomplete    Radiology Reports Ct Head Wo Contrast  Result Date: 11/29/2015 CLINICAL DATA:  Unsteady  gait.  Confusion and fever for 3 days. EXAM: CT HEAD WITHOUT CONTRAST TECHNIQUE: Contiguous axial images were obtained from the base of the skull through the vertex without intravenous contrast. COMPARISON:  10/01/2014 FINDINGS: There is no intracranial hemorrhage, mass or evidence of acute infarction. There is moderate generalized  atrophy. There is moderate chronic microvascular ischemic change. There is no significant extra-axial fluid collection. No acute intracranial findings are evident. The calvarium and skullbase are intact. The visible paranasal sinuses and orbits are unremarkable. IMPRESSION: No acute intracranial findings. There is moderate generalized atrophy and chronic appearing white matter hypodensities which likely represent small vessel ischemic disease. Electronically Signed   By: Ellery Plunk M.D.   On: 11/29/2015 00:37   Dg Chest Port 1 View  Result Date: 11/28/2015 CLINICAL DATA:  Sepsis.  Confusion for 2 days. EXAM: PORTABLE CHEST 1 VIEW COMPARISON:  None. FINDINGS: Questionable ill-defined patchy left upper lobe/ apical opacity. Linear subsegmental atelectasis at the left lung base. Right lung is clear. Heart at the upper limits normal in size. There is mild tortuosity of the thoracic aorta. Mediastinal contours are otherwise normal. No evidence of pleural effusion or pneumothorax. Osseous structures are grossly intact. IMPRESSION: Questionable ill-defined vague upper lobe opacity. This may be pneumonia versus artifact. PA and lateral views recommended when patient is able. Electronically Signed   By: Rubye Oaks M.D.   On: 11/28/2015 23:52     CBC  Recent Labs Lab 11/28/15 2307  WBC 11.8*  HGB 12.5*  HCT 36.5*  PLT 172  MCV 81.3  MCH 27.7  MCHC 34.1  RDW 15.2*  LYMPHSABS 1.4  MONOABS 1.9*  EOSABS 1.1*  BASOSABS 0.1    Chemistries   Recent Labs Lab 11/28/15 2307  NA 126*  K 3.6  CL 92*  CO2 26  GLUCOSE 107*  BUN 12  CREATININE 0.97  CALCIUM 8.6*  AST 16  ALT 14*  ALKPHOS 79  BILITOT 0.5   ------------------------------------------------------------------------------------------------------------------ estimated creatinine clearance is 74.4 mL/min (by C-G formula based on SCr of 0.97  mg/dL). ------------------------------------------------------------------------------------------------------------------ No results for input(s): HGBA1C in the last 72 hours. ------------------------------------------------------------------------------------------------------------------ No results for input(s): CHOL, HDL, LDLCALC, TRIG, CHOLHDL, LDLDIRECT in the last 72 hours. ------------------------------------------------------------------------------------------------------------------  Recent Labs  11/28/15 2307  TSH 1.361   ------------------------------------------------------------------------------------------------------------------ No results for input(s): VITAMINB12, FOLATE, FERRITIN, TIBC, IRON, RETICCTPCT in the last 72 hours.  Coagulation profile No results for input(s): INR, PROTIME in the last 168 hours.  No results for input(s): DDIMER in the last 72 hours.  Cardiac Enzymes  Recent Labs Lab 11/28/15 2307  TROPONINI <0.03   ------------------------------------------------------------------------------------------------------------------ Invalid input(s): POCBNP    Assessment & Plan   Assessment/Plan This is a 70 year old male admitted for sepsis secondary to pneumonia. 1. Hyponatremia suspect due to dehydration we'll give him IV fluids monitor sodium 2. Pneumonia: Community-acquired. Continue antibiotics as above 3. Acute encephalopathy due to combination of fever and hyponatremia not improved  4. GERD: Continue pantoprazole per home regimen. The patient is also on sulfasalazine. For colitis hold for now 5. DVT prophylaxis: Lovenox 6. GI prophylaxis: As above     Code Status Orders        Start     Ordered   11/29/15 0338  Full code  Continuous     11/29/15 0337    Code Status History    Date Active Date Inactive Code Status Order ID Comments User Context   This patient has a current code status but no historical code  status.            Consults  none   DVT Prophylaxis  Lovenox    Lab Results  Component Value Date   PLT 172 11/28/2015     Time Spent in minutes    Greater than 50% of time spent in care coordination and counseling patient regarding the condition and plan of care.   Auburn Bilberry M.D on 11/29/2015 at 2:08 PM  Between 7am to 6pm - Pager - [phone]  After 6pm go to [email or web address] - password [email or web address] [facility]  [facility] [place] [facility]   Office  [phone]

## 2015-11-29 NOTE — Care Management Important Message (Signed)
Important Message  Patient Details  Name: Jeff Key MRN: [id number] Date of Birth: 03/25/1945   Medicare Important Message Given:  Yes    Daltyn Degroat A, RN 11/29/2015, 6:55 AM

## 2015-11-29 NOTE — Progress Notes (Signed)
Pharmacy Antibiotic Note  Jeff Key is a 70 y.o. male admitted on 11/28/2015 with pneumonia.  Pharmacy has been consulted for ceftriaxone and azithromycin dosing.  Plan: Ceftriaxone 1 gram daily and azithromycin 500 mg x1 and then 250 mg daily ordered by MD. Will continue current orders.  Height: 5\' 11"  (180.3 cm) Weight: 198 lb (89.8 kg) IBW/kg (Calculated) : 75.3  Temp (24hrs), Avg:100.4 F (38 C), Min:98.6 F (37 C), Max:102.2 F (39 C)   Recent Labs Lab 11/28/15 2307 11/28/15 2308  WBC 11.8*  --   CREATININE 0.97  --   LATICACIDVEN  --  0.9    Estimated Creatinine Clearance: 74.4 mL/min (by C-G formula based on SCr of 0.97 mg/dL).    No Known Allergies  Antimicrobials this admission: azithromycin  >>  ceftriaxone  >>   Dose adjustments this admission:   Microbiology results: 9/11 BCx: pending 9/11 UCx: pending    9/11 UA: (-)  Thank you for allowing pharmacy to be a part of this patient's care.  Natalyah Cummiskey S 11/29/2015 3:41 AM

## 2015-11-29 NOTE — H&P (Signed)
Jeff Key is an 70 y.o. male.   Chief Complaint: Confusion HPI: The patient with past medical history of asthma presents emergency department complaining of altered mental status. The patient became confused after church this morning. He was found to have a fever MAXIMUM TEMPERATURE 102F. In the emergency department the patient was found to be tachycardic and tachypneic. Chest x-ray showed a vague opacity of the right upper lobe and leukocytosis was present on CBC. The patient received ceftriaxone and Tylenol which dramatically improved his mental status prior to the emergency department staff calling the hospitalist group for admission.  Past Medical History:  Diagnosis Date  . Arthritis   . Asthma   . GERD (gastroesophageal reflux disease)     Past Surgical History:  Procedure Laterality Date  . ARM Injury Repair Right 1985  . COLONOSCOPY WITH PROPOFOL N/A 05/06/2015   Procedure: COLONOSCOPY WITH PROPOFOL;  Surgeon: Hulen Luster, MD;  Location: [facility] ENDOSCOPY;  Service: Gastroenterology;  Laterality: N/A;  . ESOPHAGOGASTRODUODENOSCOPY (EGD) WITH PROPOFOL N/A 05/06/2015   Procedure: ESOPHAGOGASTRODUODENOSCOPY (EGD) WITH PROPOFOL;  Surgeon: Hulen Luster, MD;  Location: [facility] ENDOSCOPY;  Service: Gastroenterology;  Laterality: N/A;  . FRACTURE SURGERY      Family History  Problem Relation Age of Onset  . Prostate cancer Father   . Bladder Cancer Neg Hx   . Kidney cancer Neg Hx    Social History:  reports that he has quit smoking. He has never used smokeless tobacco. He reports that he does not drink alcohol or use drugs.  Allergies: No Known Allergies  Medications Prior to Admission  Medication Sig Dispense Refill  . aspirin EC 81 MG tablet Take 81 mg by mouth daily.    Marland Kitchen donepezil (ARICEPT) 5 MG tablet Take 5 mg by mouth at bedtime.    Marland Kitchen omeprazole (PRILOSEC) 20 MG capsule Take 20 mg by mouth daily.    Marland Kitchen sulfaSALAzine (AZULFIDINE) 500 MG EC tablet Take 1,000 mg by mouth 2 (two)  times daily.    . vitamin B-12 (CYANOCOBALAMIN) 1000 MCG tablet Take 1,000 mcg by mouth daily.      Results for orders placed or performed during the hospital encounter of 11/28/15 (from the past 48 hour(s))  Comprehensive metabolic panel     Status: Abnormal   Collection Time: 11/28/15 11:07 PM  Result Value Ref Range   Sodium 126 (L) 135 - 145 mmol/L   Potassium 3.6 3.5 - 5.1 mmol/L   Chloride 92 (L) 101 - 111 mmol/L   CO2 26 22 - 32 mmol/L   Glucose, Bld 107 (H) 65 - 99 mg/dL   BUN 12 6 - 20 mg/dL   Creatinine, Ser 0.97 0.61 - 1.24 mg/dL   Calcium 8.6 (L) 8.9 - 10.3 mg/dL   Total Protein 8.1 6.5 - 8.1 g/dL   Albumin 3.7 3.5 - 5.0 g/dL   AST 16 15 - 41 U/L   ALT 14 (L) 17 - 63 U/L   Alkaline Phosphatase 79 38 - 126 U/L   Total Bilirubin 0.5 0.3 - 1.2 mg/dL   GFR calc non Af Amer >60 >60 mL/min   GFR calc Af Amer >60 >60 mL/min    Comment: (NOTE) The eGFR has been calculated using the CKD EPI equation. This calculation has not been validated in all clinical situations. eGFR's persistently <60 mL/min signify possible Chronic Kidney Disease.    Anion gap 8 5 - 15  CBC WITH DIFFERENTIAL     Status: Abnormal  Collection Time: 11/28/15 11:07 PM  Result Value Ref Range   WBC 11.8 (H) 3.8 - 10.6 K/uL   RBC 4.49 4.40 - 5.90 MIL/uL   Hemoglobin 12.5 (L) 13.0 - 18.0 g/dL   HCT 36.5 (L) 40.0 - 52.0 %   MCV 81.3 80.0 - 100.0 fL   MCH 27.7 26.0 - 34.0 pg   MCHC 34.1 32.0 - 36.0 g/dL   RDW 15.2 (H) 11.5 - 14.5 %   Platelets 172 150 - 440 K/uL   Neutrophils Relative % 62 %   Neutro Abs 7.2 (H) 1.4 - 6.5 K/uL   Lymphocytes Relative 12 %   Lymphs Abs 1.4 1.0 - 3.6 K/uL   Monocytes Relative 16 %   Monocytes Absolute 1.9 (H) 0.2 - 1.0 K/uL   Eosinophils Relative 9 %   Eosinophils Absolute 1.1 (H) 0 - 0.7 K/uL   Basophils Relative 1 %   Basophils Absolute 0.1 0 - 0.1 K/uL  Troponin I     Status: None   Collection Time: 11/28/15 11:07 PM  Result Value Ref Range   Troponin I <0.03  <0.03 ng/mL  TSH     Status: None   Collection Time: 11/28/15 11:07 PM  Result Value Ref Range   TSH 1.361 0.350 - 4.500 uIU/mL  Lactic acid, plasma     Status: None   Collection Time: 11/28/15 11:08 PM  Result Value Ref Range   Lactic Acid, Venous 0.9 0.5 - 1.9 mmol/L  Urinalysis complete, with microscopic ([facility] only)     Status: Abnormal   Collection Time: 11/29/15  1:47 AM  Result Value Ref Range   Color, Urine YELLOW (A) YELLOW   APPearance CLEAR (A) CLEAR   Glucose, UA NEGATIVE NEGATIVE mg/dL   Bilirubin Urine NEGATIVE NEGATIVE   Ketones, ur NEGATIVE NEGATIVE mg/dL   Specific Gravity, Urine 1.004 (L) 1.005 - 1.030   Hgb urine dipstick 2+ (A) NEGATIVE   pH 6.0 5.0 - 8.0   Protein, ur NEGATIVE NEGATIVE mg/dL   Nitrite NEGATIVE NEGATIVE   Leukocytes, UA NEGATIVE NEGATIVE   RBC / HPF 0-5 0 - 5 RBC/hpf   WBC, UA 0-5 0 - 5 WBC/hpf   Bacteria, UA NONE SEEN NONE SEEN   Squamous Epithelial / LPF NONE SEEN NONE SEEN  Lactic acid, plasma     Status: None   Collection Time: 11/29/15  4:18 AM  Result Value Ref Range   Lactic Acid, Venous 0.7 0.5 - 1.9 mmol/L   Ct Head Wo Contrast  Result Date: 11/29/2015 CLINICAL DATA:  Unsteady gait.  Confusion and fever for 3 days. EXAM: CT HEAD WITHOUT CONTRAST TECHNIQUE: Contiguous axial images were obtained from the base of the skull through the vertex without intravenous contrast. COMPARISON:  10/01/2014 FINDINGS: There is no intracranial hemorrhage, mass or evidence of acute infarction. There is moderate generalized atrophy. There is moderate chronic microvascular ischemic change. There is no significant extra-axial fluid collection. No acute intracranial findings are evident. The calvarium and skullbase are intact. The visible paranasal sinuses and orbits are unremarkable. IMPRESSION: No acute intracranial findings. There is moderate generalized atrophy and chronic appearing white matter hypodensities which likely represent small vessel ischemic  disease. Electronically Signed   By: Andreas Newport M.D.   On: 11/29/2015 00:37   Dg Chest Port 1 View  Result Date: 11/28/2015 CLINICAL DATA:  Sepsis.  Confusion for 2 days. EXAM: PORTABLE CHEST 1 VIEW COMPARISON:  None. FINDINGS: Questionable ill-defined patchy left upper lobe/ apical opacity.  Linear subsegmental atelectasis at the left lung base. Right lung is clear. Heart at the upper limits normal in size. There is mild tortuosity of the thoracic aorta. Mediastinal contours are otherwise normal. No evidence of pleural effusion or pneumothorax. Osseous structures are grossly intact. IMPRESSION: Questionable ill-defined vague upper lobe opacity. This may be pneumonia versus artifact. PA and lateral views recommended when patient is able. Electronically Signed   By: Jeb Levering M.D.   On: 11/28/2015 23:52    Review of Systems  Constitutional: Positive for fever. Negative for chills.  HENT: Negative for sore throat and tinnitus.   Eyes: Negative for blurred vision and redness.  Respiratory: Positive for cough. Negative for sputum production and shortness of breath.   Cardiovascular: Negative for chest pain, palpitations, orthopnea and PND.  Gastrointestinal: Negative for abdominal pain, diarrhea, nausea and vomiting.  Genitourinary: Negative for dysuria, frequency and urgency.  Musculoskeletal: Negative for joint pain and myalgias.  Skin: Negative for rash.       No lesions  Neurological: Negative for speech change, focal weakness and weakness.  Endo/Heme/Allergies: Does not bruise/bleed easily.       No temperature intolerance  Psychiatric/Behavioral: Negative for depression and suicidal ideas.    Blood pressure (!) 146/71, pulse 75, temperature 98.1 F (36.7 C), temperature source Oral, resp. rate 18, height 5' 11"  (1.803 m), weight 88.8 kg (195 lb 12.8 oz), SpO2 98 %. Physical Exam  Constitutional: He is oriented to person, place, and time. He appears well-developed and  well-nourished. No distress.  HENT:  Head: Normocephalic and atraumatic.  Mouth/Throat: Oropharynx is clear and moist.  Eyes: Conjunctivae are normal. Pupils are equal, round, and reactive to light. No scleral icterus.  Neck: Normal range of motion. Neck supple. No JVD present. No tracheal deviation present. No thyromegaly present.  Cardiovascular: Normal rate, regular rhythm and normal heart sounds.  Exam reveals no gallop and no friction rub.   No murmur heard. Respiratory: Effort normal and breath sounds normal. No respiratory distress.  GI: Soft. Bowel sounds are normal. He exhibits no distension. There is no tenderness.  Genitourinary:  Genitourinary Comments: Deferred  Musculoskeletal: Normal range of motion. He exhibits no edema.  Lymphadenopathy:    He has no cervical adenopathy.  Neurological: He is alert and oriented to person, place, and time. No cranial nerve deficit.  Skin: Skin is warm and dry. No erythema.  Psychiatric: He has a normal mood and affect. His behavior is normal. Judgment and thought content normal.     Assessment/Plan This is a 70 year old male admitted for sepsis secondary to pneumonia. 1. Sepsis: The patient criteria via tachycardia, tachypnea and leukocytosis. He is hemodynamically stable. Continue ceftriaxone and azithromycin for community-acquired pneumonia. Follow blood cultures for growth and sensitivities. 2. Pneumonia: Community-acquired. Continue antibiotics as above. The patient has normal oxygen saturations on room air. 3. Confusion: Improving. Underlying dementia or mild cognitive impairment. Continue Aricept. 4. GERD: Continue pantoprazole per home regimen. The patient is also on sulfasalazine. 5. DVT prophylaxis: Lovenox 6. GI prophylaxis: As above The patient is a full code. Time spent on admission was inpatient care possibly 45 minutes  Harrie Foreman, MD 11/29/2015, 6:09 AM

## 2015-11-29 NOTE — Progress Notes (Addendum)
Pharmacy Antibiotic Note  Jeff Key is a 70 y.o. male admitted on 11/28/2015 with pneumonia.  Pharmacy has been consulted for ceftriaxone and azithromycin dosing.  Plan: Ceftriaxone 1g IV x1 and azithromycin 500mg  IV x1 given in the ED. Continue Ceftriaxone 1 gram daily and azithromycin 250 mg daily.  Follow cultures.   Height: 5\' 11"  (180.3 cm) Weight: 195 lb 12.8 oz (88.8 kg) IBW/kg (Calculated) : 75.3  Temp (24hrs), Avg:99.6 F (37.6 C), Min:98.1 F (36.7 C), Max:102.2 F (39 C)   Recent Labs Lab 11/28/15 2307 11/28/15 2308 11/29/15 0418  WBC 11.8*  --   --   CREATININE 0.97  --   --   LATICACIDVEN  --  0.9 0.7    Estimated Creatinine Clearance: 74.4 mL/min (by C-G formula based on SCr of 0.97 mg/dL).    No Known Allergies  Antimicrobials this admission: azithromycin  9/10>>  ceftriaxone 9/10 >>   Dose adjustments this admission:   Microbiology results: 9/11 BCx: pending 9/11 UCx: pending    9/11 UA: (-)  Thank you for allowing pharmacy to be a part of this patient's care.  [name], PharmD Clinical Pharmacist 11/29/2015 10:12 AM

## 2015-11-30 LAB — BASIC METABOLIC PANEL
ANION GAP: 8 (ref 5–15)
BUN: 8 mg/dL (ref 6–20)
CHLORIDE: 99 mmol/L — AB (ref 101–111)
CO2: 23 mmol/L (ref 22–32)
Calcium: 8.3 mg/dL — ABNORMAL LOW (ref 8.9–10.3)
Creatinine, Ser: 0.83 mg/dL (ref 0.61–1.24)
GFR calc Af Amer: >60 mL/min (ref >60)
Glucose, Bld: 126 mg/dL — ABNORMAL HIGH (ref 65–99)
POTASSIUM: 3.7 mmol/L (ref 3.5–5.1)
SODIUM: 130 mmol/L — AB (ref 135–145)

## 2015-11-30 LAB — URINE CULTURE: CULTURE: NO GROWTH

## 2015-11-30 MED ORDER — LEVOFLOXACIN 500 MG PO TABS: 500.0000 mg | ORAL_TABLET | Freq: Every day | ORAL | 0 refills | Status: AC

## 2015-11-30 NOTE — Progress Notes (Signed)
IV's were removed. Discharge instructions, follow-up appointments, and prescriptions were provided to the pt and wife at bedside. All questions were answered. The pt was taken downstairs via wheelchair.

## 2015-11-30 NOTE — Progress Notes (Signed)
Pharmacy Antibiotic Note  Jeff Key is a 70 y.o. male admitted on 11/28/2015 with pneumonia.  Pharmacy has been consulted for ceftriaxone and azithromycin dosing.  Plan: Ceftriaxone 1g IV x1 and azithromycin 500mg  IV x1 given in the ED. Continue Ceftriaxone 1 gram daily and azithromycin 250 mg daily.  Follow cultures.   Height: 5\' 11"  (180.3 cm) Weight: 194 lb (88 kg) IBW/kg (Calculated) : 75.3  Temp (24hrs), Avg:98.2 F (36.8 C), Min:98 F (36.7 C), Max:98.3 F (36.8 C)   Recent Labs Lab 11/28/15 2307 11/28/15 2308 11/29/15 0418  WBC 11.8*  --   --   CREATININE 0.97  --   --   LATICACIDVEN  --  0.9 0.7    Estimated Creatinine Clearance: 74.4 mL/min (by C-G formula based on SCr of 0.97 mg/dL).    No Known Allergies  Antimicrobials this admission: azithromycin  9/10>>  ceftriaxone 9/10 >>   Dose adjustments this admission:   Microbiology results: 9/11 BCx: NGTD 9/11 UCx: pending    9/11 UA: (-)  Thank you for allowing pharmacy to be a part of this patient's care.  [name], PharmD Clinical Pharmacist 11/30/2015 10:20 AM

## 2015-11-30 NOTE — Discharge Summary (Signed)
Jeff Key, 70 y.o., DOB [birth year], MRN [id number]. Admission date: 11/28/2015 Discharge Date 11/30/2015 Primary MD [name], MD Admitting Physician Arnaldo Natal, MD  Admission Diagnosis  Hyponatremia [E87.1] Disorientation [R41.0] Sepsis, due to unspecified organism [facility]) [A41.9] Fever, unspecified fever cause [R50.9]  Discharge Diagnosis   Active Problems:  Acute encephalopathy due to pneumonia Sepsis present on admission due to pneumonia Hyponatremia due to dehydration Likely underlying dementia and needs outpatient neurology evaluation Osteoarthritis History asthma GERD       Hospital Course  The patient with past medical history of asthma presents emergency department complaining of altered mental status. The patient became confused after church this morning. He was found to have a fever MAXIMUM TEMPERATURE 102F. In the emergency department the patient was found to be tachycardic and tachypneic. Chest x-ray showed a vague opacity of the right upper lobe and leukocytosis was present on CBC. He was admitted for pneumonia. Patient also was noticed to have hyponatremia. Patient's mental status normalized after his temperature normalized. He is doing much better and switch to oral antibiotics. Patient also was noted to be dehydrated and given IV fluids. His sodium is much improved now he'll need to continue to drink plenty of fluids follow up BMP as an outpatient            Consults  None  Significant Tests:  See full reports for all details     Ct Head Wo Contrast  Result Date: 11/29/2015 CLINICAL DATA:  Unsteady gait.  Confusion and fever for 3 days. EXAM: CT HEAD WITHOUT CONTRAST TECHNIQUE: Contiguous axial images were obtained from the base of the skull through the vertex without intravenous contrast. COMPARISON:  10/01/2014 FINDINGS: There is no intracranial hemorrhage, mass or evidence of acute infarction. There is moderate generalized atrophy. There  is moderate chronic microvascular ischemic change. There is no significant extra-axial fluid collection. No acute intracranial findings are evident. The calvarium and skullbase are intact. The visible paranasal sinuses and orbits are unremarkable. IMPRESSION: No acute intracranial findings. There is moderate generalized atrophy and chronic appearing white matter hypodensities which likely represent small vessel ischemic disease. Electronically Signed   By: Ellery Plunk M.D.   On: 11/29/2015 00:37   Dg Chest Port 1 View  Result Date: 11/28/2015 CLINICAL DATA:  Sepsis.  Confusion for 2 days. EXAM: PORTABLE CHEST 1 VIEW COMPARISON:  None. FINDINGS: Questionable ill-defined patchy left upper lobe/ apical opacity. Linear subsegmental atelectasis at the left lung base. Right lung is clear. Heart at the upper limits normal in size. There is mild tortuosity of the thoracic aorta. Mediastinal contours are otherwise normal. No evidence of pleural effusion or pneumothorax. Osseous structures are grossly intact. IMPRESSION: Questionable ill-defined vague upper lobe opacity. This may be pneumonia versus artifact. PA and lateral views recommended when patient is able. Electronically Signed   By: Rubye Oaks M.D.   On: 11/28/2015 23:52       Today   Subjective:   Jeff Key Feeling well denies any complaints    Objective:   Blood pressure 139/80, pulse 74, temperature 98 F (36.7 C), temperature source Oral, resp. rate 20, height 5\' 11"  (1.803 m), weight 194 lb (88 kg), SpO2 96 %.  .  Intake/Output Summary (Last 24 hours) at 11/30/15 1245 Last data filed at 11/30/15 0945  Gross per 24 hour  Intake          2004.11 ml  Output  0 ml  Net          2004.11 ml    Exam VITAL SIGNS: Blood pressure 139/80, pulse 74, temperature 98 F (36.7 C), temperature source Oral, resp. rate 20, height 5\' 11"  (1.803 m), weight 194 lb (88 kg), SpO2 96 %.  GENERAL:  70 y.o.-year-old patient  lying in the bed with no acute distress.  EYES: Pupils equal, round, reactive to light and accommodation. No scleral icterus. Extraocular muscles intact.  HEENT: Head atraumatic, normocephalic. Oropharynx and nasopharynx clear.  NECK:  Supple, no jugular venous distention. No thyroid enlargement, no tenderness.  LUNGS: Normal breath sounds bilaterally, no wheezing, rales,rhonchi or crepitation. No use of accessory muscles of respiration.  CARDIOVASCULAR: S1, S2 normal. No murmurs, rubs, or gallops.  ABDOMEN: Soft, nontender, nondistended. Bowel sounds present. No organomegaly or mass.  EXTREMITIES: No pedal edema, cyanosis, or clubbing.  NEUROLOGIC: Cranial nerves II through XII are intact. Muscle strength 5/5 in all extremities. Sensation intact. Gait not checked.  PSYCHIATRIC: The patient is alert and oriented x 3.  SKIN: No obvious rash, lesion, or ulcer.   Data Review     CBC w Diff: Lab Results  Component Value Date   WBC 11.8 (H) 11/28/2015   HGB 12.5 (L) 11/28/2015   HCT 36.5 (L) 11/28/2015   PLT 172 11/28/2015   LYMPHOPCT 12 11/28/2015   MONOPCT 16 11/28/2015   EOSPCT 9 11/28/2015   BASOPCT 1 11/28/2015   CMP: Lab Results  Component Value Date   NA 130 (L) 11/30/2015   K 3.7 11/30/2015   CL 99 (L) 11/30/2015   CO2 23 11/30/2015   BUN 8 11/30/2015   CREATININE 0.83 11/30/2015   PROT 8.1 11/28/2015   ALBUMIN 3.7 11/28/2015   BILITOT 0.5 11/28/2015   ALKPHOS 79 11/28/2015   AST 16 11/28/2015   ALT 14 (L) 11/28/2015  .  Micro Results Recent Results (from the past 240 hour(s))  Blood Culture (routine x 2)     Status: None (Preliminary result)   Collection Time: 11/28/15 11:08 PM  Result Value Ref Range Status   Specimen Description BLOOD RIGHT ASSIST CONTROL  Final   Special Requests   Final    BOTTLES DRAWN AEROBIC AND ANAEROBIC 12CCAERO,8CCANA   Culture NO GROWTH 2 DAYS  Final   Report Status PENDING  Incomplete  Blood Culture (routine x 2)     Status: None  (Preliminary result)   Collection Time: 11/29/15 12:40 AM  Result Value Ref Range Status   Specimen Description BLOOD RIGHT WRIST  Final   Special Requests   Final    BOTTLES DRAWN AEROBIC AND ANAEROBIC 15CCAERO,12CCANA   Culture NO GROWTH 1 DAY  Final   Report Status PENDING  Incomplete  Urine culture     Status: None   Collection Time: 11/29/15  1:47 AM  Result Value Ref Range Status   Specimen Description URINE, RANDOM  Final   Special Requests NONE  Final   Culture NO GROWTH Performed at [facility]   Final   Report Status 11/30/2015 FINAL  Final        Code Status Orders        Start     Ordered   11/29/15 0338  Full code  Continuous     11/29/15 0337    Code Status History    Date Active Date Inactive Code Status Order ID Comments User Context   This patient has a current code status but no historical  code status.          Follow-up Information    Ignacia Bayleyobert Tumey, PA-C. Go on 12/07/2015.   Specialty:  Physician Assistant Why:  @9 :45am Contact information: [address] [facility] [place] [id number] [phone]           Discharge Medications     Medication List    TAKE these medications   aspirin EC 81 MG tablet Take 81 mg by mouth daily.   donepezil 5 MG tablet Commonly known as:  ARICEPT Take 5 mg by mouth at bedtime.   levofloxacin 500 MG tablet Commonly known as:  LEVAQUIN Take 1 tablet (500 mg total) by mouth daily.   omeprazole 20 MG capsule Commonly known as:  PRILOSEC Take 20 mg by mouth daily.   sulfaSALAzine 500 MG EC tablet Commonly known as:  AZULFIDINE Take 1,000 mg by mouth 2 (two) times daily.   vitamin B-12 1000 MCG tablet Commonly known as:  CYANOCOBALAMIN Take 1,000 mcg by mouth daily.          Total Time in preparing paper work, data evaluation and todays exam - 35 minutes  Auburn BilberryPATEL, Maudry Zeidan M.D on 11/30/2015 at 12:45 PM  [facility]   Office   [phone]

## 2015-12-03 LAB — CULTURE, BLOOD (ROUTINE X 2): CULTURE: NO GROWTH

## 2015-12-04 LAB — CULTURE, BLOOD (ROUTINE X 2): CULTURE: NO GROWTH

## 2015-12-07 DIAGNOSIS — E538 Deficiency of other specified B group vitamins: Secondary | ICD-10-CM | POA: Diagnosis not present

## 2015-12-07 DIAGNOSIS — Z125 Encounter for screening for malignant neoplasm of prostate: Secondary | ICD-10-CM | POA: Diagnosis not present

## 2015-12-07 DIAGNOSIS — E871 Hypo-osmolality and hyponatremia: Secondary | ICD-10-CM | POA: Diagnosis not present

## 2015-12-07 DIAGNOSIS — D72829 Elevated white blood cell count, unspecified: Secondary | ICD-10-CM | POA: Diagnosis not present

## 2015-12-07 DIAGNOSIS — J189 Pneumonia, unspecified organism: Secondary | ICD-10-CM | POA: Diagnosis not present

## 2015-12-07 DIAGNOSIS — R413 Other amnesia: Secondary | ICD-10-CM | POA: Diagnosis not present

## 2015-12-17 DIAGNOSIS — L570 Actinic keratosis: Secondary | ICD-10-CM | POA: Diagnosis not present

## 2015-12-17 DIAGNOSIS — E8809 Other disorders of plasma-protein metabolism, not elsewhere classified: Secondary | ICD-10-CM | POA: Diagnosis not present

## 2015-12-17 DIAGNOSIS — Z23 Encounter for immunization: Secondary | ICD-10-CM | POA: Diagnosis not present

## 2015-12-17 DIAGNOSIS — D649 Anemia, unspecified: Secondary | ICD-10-CM | POA: Diagnosis not present

## 2015-12-17 DIAGNOSIS — K51311 Ulcerative (chronic) rectosigmoiditis with rectal bleeding: Secondary | ICD-10-CM | POA: Diagnosis not present

## 2015-12-17 DIAGNOSIS — E538 Deficiency of other specified B group vitamins: Secondary | ICD-10-CM | POA: Diagnosis not present

## 2015-12-17 DIAGNOSIS — R413 Other amnesia: Secondary | ICD-10-CM | POA: Diagnosis not present

## 2015-12-17 DIAGNOSIS — D6489 Other specified anemias: Secondary | ICD-10-CM | POA: Diagnosis not present

## 2015-12-17 DIAGNOSIS — Z0000 Encounter for general adult medical examination without abnormal findings: Secondary | ICD-10-CM | POA: Diagnosis not present

## 2015-12-17 DIAGNOSIS — F0150 Vascular dementia without behavioral disturbance: Secondary | ICD-10-CM | POA: Diagnosis not present

## 2015-12-17 DIAGNOSIS — R634 Abnormal weight loss: Secondary | ICD-10-CM | POA: Diagnosis not present

## 2015-12-22 ENCOUNTER — Other Ambulatory Visit: Payer: Self-pay | Admitting: Internal Medicine

## 2015-12-22 DIAGNOSIS — K51311 Ulcerative (chronic) rectosigmoiditis with rectal bleeding: Secondary | ICD-10-CM

## 2015-12-22 DIAGNOSIS — R634 Abnormal weight loss: Secondary | ICD-10-CM

## 2015-12-22 DIAGNOSIS — D649 Anemia, unspecified: Secondary | ICD-10-CM

## 2015-12-24 DIAGNOSIS — F0150 Vascular dementia without behavioral disturbance: Secondary | ICD-10-CM | POA: Diagnosis not present

## 2015-12-24 DIAGNOSIS — R413 Other amnesia: Secondary | ICD-10-CM | POA: Diagnosis not present

## 2015-12-24 DIAGNOSIS — G309 Alzheimer's disease, unspecified: Secondary | ICD-10-CM | POA: Diagnosis not present

## 2015-12-24 DIAGNOSIS — F0280 Dementia in other diseases classified elsewhere without behavioral disturbance: Secondary | ICD-10-CM | POA: Diagnosis not present

## 2016-01-04 ENCOUNTER — Ambulatory Visit
Admission: RE | Admit: 2016-01-04 | Discharge: 2016-01-04 | Disposition: A | Discharge status: Home / Self Care | Payer: PPO | Source: Ambulatory Visit | Attending: Internal Medicine | Admitting: Internal Medicine

## 2016-01-04 DIAGNOSIS — K51311 Ulcerative (chronic) rectosigmoiditis with rectal bleeding: Secondary | ICD-10-CM | POA: Insufficient documentation

## 2016-01-04 DIAGNOSIS — D649 Anemia, unspecified: Secondary | ICD-10-CM | POA: Diagnosis not present

## 2016-01-04 DIAGNOSIS — R634 Abnormal weight loss: Secondary | ICD-10-CM | POA: Insufficient documentation

## 2016-01-04 MED ORDER — IOPAMIDOL (ISOVUE-300) INJECTION 61%
100.0000 mL | Freq: Once | INTRAVENOUS | Status: AC | PRN
  Administered 2016-01-04: 100 mL via INTRAVENOUS

## 2016-01-11 ENCOUNTER — Other Ambulatory Visit: Payer: Self-pay | Admitting: Neurology

## 2016-01-11 DIAGNOSIS — R413 Other amnesia: Secondary | ICD-10-CM

## 2016-01-21 ENCOUNTER — Ambulatory Visit
Admission: RE | Admit: 2016-01-21 | Discharge: 2016-01-21 | Disposition: A | Discharge status: Home / Self Care | Payer: PPO | Source: Ambulatory Visit | Attending: Neurology | Admitting: Neurology

## 2016-01-21 DIAGNOSIS — I6501 Occlusion and stenosis of right vertebral artery: Secondary | ICD-10-CM | POA: Diagnosis not present

## 2016-01-21 DIAGNOSIS — R413 Other amnesia: Secondary | ICD-10-CM | POA: Insufficient documentation

## 2016-01-21 DIAGNOSIS — R410 Disorientation, unspecified: Secondary | ICD-10-CM | POA: Diagnosis not present

## 2016-01-31 DIAGNOSIS — E8809 Other disorders of plasma-protein metabolism, not elsewhere classified: Secondary | ICD-10-CM | POA: Diagnosis not present

## 2016-01-31 DIAGNOSIS — F0280 Dementia in other diseases classified elsewhere without behavioral disturbance: Secondary | ICD-10-CM | POA: Diagnosis not present

## 2016-01-31 DIAGNOSIS — R634 Abnormal weight loss: Secondary | ICD-10-CM | POA: Diagnosis not present

## 2016-01-31 DIAGNOSIS — D6489 Other specified anemias: Secondary | ICD-10-CM | POA: Diagnosis not present

## 2016-01-31 DIAGNOSIS — F0150 Vascular dementia without behavioral disturbance: Secondary | ICD-10-CM | POA: Diagnosis not present

## 2016-01-31 DIAGNOSIS — G309 Alzheimer's disease, unspecified: Secondary | ICD-10-CM | POA: Diagnosis not present

## 2016-02-10 ENCOUNTER — Inpatient Hospital Stay (HOSPITAL_COMMUNITY)
Admission: EM | Admit: 2016-02-10 | Discharge: 2016-02-15 | DRG: 247 | Disposition: A | Discharge status: Home Health | Payer: PPO | Source: Other Acute Inpatient Hospital | Attending: Cardiovascular Disease | Admitting: Cardiovascular Disease

## 2016-02-10 ENCOUNTER — Emergency Department
Admission: EM | Admit: 2016-02-10 | Discharge: 2016-02-10 | Payer: PPO | Attending: Emergency Medicine | Admitting: Emergency Medicine

## 2016-02-10 ENCOUNTER — Encounter: Payer: Self-pay | Admitting: Intensive Care

## 2016-02-10 ENCOUNTER — Encounter (HOSPITAL_COMMUNITY)
Admission: EM | Disposition: A | Discharge status: Home Health | Payer: Self-pay | Source: Other Acute Inpatient Hospital | Attending: Cardiovascular Disease

## 2016-02-10 DIAGNOSIS — I255 Ischemic cardiomyopathy: Secondary | ICD-10-CM | POA: Diagnosis not present

## 2016-02-10 DIAGNOSIS — I493 Ventricular premature depolarization: Secondary | ICD-10-CM | POA: Diagnosis not present

## 2016-02-10 DIAGNOSIS — Z79899 Other long term (current) drug therapy: Secondary | ICD-10-CM | POA: Insufficient documentation

## 2016-02-10 DIAGNOSIS — I2510 Atherosclerotic heart disease of native coronary artery without angina pectoris: Secondary | ICD-10-CM | POA: Diagnosis not present

## 2016-02-10 DIAGNOSIS — I2111 ST elevation (STEMI) myocardial infarction involving right coronary artery: Secondary | ICD-10-CM

## 2016-02-10 DIAGNOSIS — K219 Gastro-esophageal reflux disease without esophagitis: Secondary | ICD-10-CM | POA: Diagnosis present

## 2016-02-10 DIAGNOSIS — J45909 Unspecified asthma, uncomplicated: Secondary | ICD-10-CM | POA: Diagnosis not present

## 2016-02-10 DIAGNOSIS — M1990 Unspecified osteoarthritis, unspecified site: Secondary | ICD-10-CM | POA: Diagnosis present

## 2016-02-10 DIAGNOSIS — Z7982 Long term (current) use of aspirin: Secondary | ICD-10-CM | POA: Diagnosis not present

## 2016-02-10 DIAGNOSIS — G309 Alzheimer's disease, unspecified: Secondary | ICD-10-CM | POA: Diagnosis not present

## 2016-02-10 DIAGNOSIS — I472 Ventricular tachycardia: Secondary | ICD-10-CM | POA: Diagnosis present

## 2016-02-10 DIAGNOSIS — Z955 Presence of coronary angioplasty implant and graft: Secondary | ICD-10-CM

## 2016-02-10 DIAGNOSIS — I213 ST elevation (STEMI) myocardial infarction of unspecified site: Secondary | ICD-10-CM

## 2016-02-10 DIAGNOSIS — F0280 Dementia in other diseases classified elsewhere without behavioral disturbance: Secondary | ICD-10-CM | POA: Diagnosis not present

## 2016-02-10 DIAGNOSIS — R079 Chest pain, unspecified: Secondary | ICD-10-CM | POA: Diagnosis not present

## 2016-02-10 DIAGNOSIS — I2119 ST elevation (STEMI) myocardial infarction involving other coronary artery of inferior wall: Secondary | ICD-10-CM | POA: Diagnosis not present

## 2016-02-10 DIAGNOSIS — R0602 Shortness of breath: Secondary | ICD-10-CM | POA: Diagnosis not present

## 2016-02-10 DIAGNOSIS — I4729 Other ventricular tachycardia: Secondary | ICD-10-CM

## 2016-02-10 DIAGNOSIS — Z87891 Personal history of nicotine dependence: Secondary | ICD-10-CM

## 2016-02-10 DIAGNOSIS — Z8042 Family history of malignant neoplasm of prostate: Secondary | ICD-10-CM | POA: Diagnosis not present

## 2016-02-10 HISTORY — DX: Personal history of peptic ulcer disease: Z87.11

## 2016-02-10 HISTORY — DX: Unspecified dementia, unspecified severity, without behavioral disturbance, psychotic disturbance, mood disturbance, and anxiety: F03.90

## 2016-02-10 HISTORY — DX: Personal history of other diseases of the digestive system: Z87.19

## 2016-02-10 HISTORY — PX: CARDIAC CATHETERIZATION: SHX172

## 2016-02-10 HISTORY — DX: Ischemic cardiomyopathy: I25.5

## 2016-02-10 HISTORY — DX: Anemia, unspecified: D64.9

## 2016-02-10 HISTORY — DX: Atherosclerotic heart disease of native coronary artery without angina pectoris: I25.10

## 2016-02-10 LAB — BASIC METABOLIC PANEL
ANION GAP: 19 — AB (ref 5–15)
Anion gap: 12 (ref 5–15)
BUN: 13 mg/dL (ref 6–20)
BUN: 7 mg/dL (ref 6–20)
CALCIUM: 9.5 mg/dL (ref 8.9–10.3)
CALCIUM: 5.7 mg/dL — AB (ref 8.9–10.3)
CO2: 20 mmol/L — ABNORMAL LOW (ref 22–32)
CO2: 12 mmol/L — ABNORMAL LOW (ref 22–32)
Chloride: 100 mmol/L — ABNORMAL LOW (ref 101–111)
Chloride: 117 mmol/L — ABNORMAL HIGH (ref 101–111)
Creatinine, Ser: 0.97 mg/dL (ref 0.61–1.24)
Creatinine, Ser: 0.46 mg/dL — ABNORMAL LOW (ref 0.61–1.24)
GLUCOSE: 94 mg/dL (ref 65–99)
Glucose, Bld: 161 mg/dL — ABNORMAL HIGH (ref 65–99)
POTASSIUM: 2.3 mmol/L — AB (ref 3.5–5.1)
Potassium: 4.2 mmol/L (ref 3.5–5.1)
SODIUM: 132 mmol/L — AB (ref 135–145)
SODIUM: 148 mmol/L — AB (ref 135–145)

## 2016-02-10 LAB — CBC
HCT: 42.4 % (ref 40.0–52.0)
HEMOGLOBIN: 14.5 g/dL (ref 13.0–18.0)
MCH: 28.8 pg (ref 26.0–34.0)
MCHC: 34.1 g/dL (ref 32.0–36.0)
MCV: 84.4 fL (ref 80.0–100.0)
Platelets: 277 10*3/uL (ref 150–440)
RBC: 5.02 MIL/uL (ref 4.40–5.90)
RDW: 16.6 % — AB (ref 11.5–14.5)
WBC: 9.4 10*3/uL (ref 3.8–10.6)

## 2016-02-10 LAB — POCT ACTIVATED CLOTTING TIME
ACTIVATED CLOTTING TIME: 131 s
Activated Clotting Time: 158 seconds

## 2016-02-10 LAB — TROPONIN I
TROPONIN I: 0.21 ng/mL — AB (ref <0.03)
TROPONIN I: 24.68 ng/mL — AB (ref <0.03)
Troponin I: 22.16 ng/mL (ref <0.03)

## 2016-02-10 LAB — MRSA PCR SCREENING: MRSA BY PCR: NEGATIVE

## 2016-02-10 LAB — MAGNESIUM: MAGNESIUM: 1.2 mg/dL — AB (ref 1.7–2.4)

## 2016-02-10 LAB — PROTIME-INR
INR: 1.07
PROTHROMBIN TIME: 13.9 s (ref 11.4–15.2)

## 2016-02-10 LAB — APTT: aPTT: 40 seconds — ABNORMAL HIGH (ref 24–36)

## 2016-02-10 SURGERY — LEFT HEART CATH AND CORONARY ANGIOGRAPHY
Anesthesia: LOCAL

## 2016-02-10 MED ORDER — MORPHINE SULFATE (PF) 2 MG/ML IV SOLN
2.0000 mg | Freq: Once | INTRAVENOUS | Status: AC
  Administered 2016-02-10: 2 mg via INTRAVENOUS
  Filled 2016-02-10: qty 1

## 2016-02-10 MED ORDER — SODIUM CHLORIDE 0.9 % IV SOLN: 1.7500 mg/kg/h | INTRAVENOUS | Status: AC

## 2016-02-10 MED ORDER — LIDOCAINE HCL (PF) 1 % IJ SOLN
INTRAMUSCULAR | Status: AC
  Filled 2016-02-10: qty 30

## 2016-02-10 MED ORDER — SODIUM CHLORIDE 0.9 % IV SOLN
30.0000 meq | Freq: Once | INTRAVENOUS | Status: AC
  Administered 2016-02-10 – 2016-02-11 (×2): 30 meq via INTRAVENOUS
  Filled 2016-02-10: qty 15

## 2016-02-10 MED ORDER — SODIUM CHLORIDE 0.9% FLUSH: 3.0000 mL | INTRAVENOUS | Status: DC | PRN

## 2016-02-10 MED ORDER — HEPARIN (PORCINE) IN NACL 2-0.9 UNIT/ML-% IJ SOLN
INTRAMUSCULAR | Status: AC
  Filled 2016-02-10: qty 1000

## 2016-02-10 MED ORDER — HEPARIN SODIUM (PORCINE) 5000 UNIT/ML IJ SOLN: 4000.0000 [IU] | Freq: Once | INTRAMUSCULAR | Status: DC

## 2016-02-10 MED ORDER — BIVALIRUDIN 250 MG IV SOLR
INTRAVENOUS | Status: AC
  Filled 2016-02-10: qty 250

## 2016-02-10 MED ORDER — DONEPEZIL HCL 5 MG PO TABS
5.0000 mg | ORAL_TABLET | Freq: Every day | ORAL | Status: DC
  Administered 2016-02-10 – 2016-02-14 (×5): 5 mg via ORAL
  Filled 2016-02-10 (×7): qty 1

## 2016-02-10 MED ORDER — LABETALOL HCL 5 MG/ML IV SOLN
10.0000 mg | INTRAVENOUS | Status: AC | PRN
  Administered 2016-02-10 (×4): 10 mg via INTRAVENOUS
  Filled 2016-02-10 (×2): qty 4

## 2016-02-10 MED ORDER — ATROPINE SULFATE 1 MG/10ML IJ SOSY
PREFILLED_SYRINGE | INTRAMUSCULAR | Status: AC
  Filled 2016-02-10: qty 10

## 2016-02-10 MED ORDER — HYDRALAZINE HCL 20 MG/ML IJ SOLN
5.0000 mg | INTRAMUSCULAR | Status: DC | PRN
  Administered 2016-02-10: 5 mg via INTRAVENOUS
  Filled 2016-02-10: qty 1

## 2016-02-10 MED ORDER — MAGNESIUM SULFATE 4 GM/100ML IV SOLN
4.0000 g | Freq: Once | INTRAVENOUS | Status: AC
  Administered 2016-02-10: 4 g via INTRAVENOUS
  Filled 2016-02-10: qty 100

## 2016-02-10 MED ORDER — NITROGLYCERIN 1 MG/10 ML FOR IR/CATH LAB
INTRA_ARTERIAL | Status: AC
  Filled 2016-02-10: qty 10

## 2016-02-10 MED ORDER — ACETAMINOPHEN 325 MG PO TABS: 650.0000 mg | ORAL_TABLET | ORAL | Status: DC | PRN

## 2016-02-10 MED ORDER — HEPARIN SODIUM (PORCINE) 5000 UNIT/ML IJ SOLN
4000.0000 [IU] | Freq: Once | INTRAMUSCULAR | Status: AC
  Administered 2016-02-10: 4000 [IU] via INTRAVENOUS

## 2016-02-10 MED ORDER — AMIODARONE LOAD VIA INFUSION
150.0000 mg | Freq: Once | INTRAVENOUS | Status: AC
  Administered 2016-02-10: 150 mg via INTRAVENOUS
  Filled 2016-02-10: qty 83.34

## 2016-02-10 MED ORDER — TICAGRELOR 90 MG PO TABS
ORAL_TABLET | ORAL | Status: AC
  Filled 2016-02-10: qty 2

## 2016-02-10 MED ORDER — IOPAMIDOL (ISOVUE-370) INJECTION 76%
INTRAVENOUS | Status: AC
  Filled 2016-02-10: qty 125

## 2016-02-10 MED ORDER — AMIODARONE HCL IN DEXTROSE 360-4.14 MG/200ML-% IV SOLN
60.0000 mg/h | INTRAVENOUS | Status: AC
  Administered 2016-02-10 (×2): 60 mg/h via INTRAVENOUS

## 2016-02-10 MED ORDER — SODIUM CHLORIDE 0.9 % IV SOLN
INTRAVENOUS | Status: DC | PRN
  Administered 2016-02-10 (×2): 1.75 mg/kg/h via INTRAVENOUS

## 2016-02-10 MED ORDER — ONDANSETRON HCL 4 MG/2ML IJ SOLN: 4.0000 mg | Freq: Four times a day (QID) | INTRAMUSCULAR | Status: DC | PRN

## 2016-02-10 MED ORDER — ALPRAZOLAM 0.5 MG PO TABS
1.0000 mg | ORAL_TABLET | Freq: Three times a day (TID) | ORAL | Status: DC | PRN
  Administered 2016-02-11: 1 mg via ORAL
  Filled 2016-02-10: qty 2

## 2016-02-10 MED ORDER — HEPARIN (PORCINE) IN NACL 100-0.45 UNIT/ML-% IJ SOLN
14.0000 [IU]/kg/h | INTRAMUSCULAR | Status: DC
  Administered 2016-02-10: 14 [IU]/kg/h via INTRAVENOUS
  Filled 2016-02-10: qty 250

## 2016-02-10 MED ORDER — TICAGRELOR 90 MG PO TABS
ORAL_TABLET | ORAL | Status: DC | PRN
  Administered 2016-02-10: 180 mg via ORAL

## 2016-02-10 MED ORDER — PANTOPRAZOLE SODIUM 40 MG PO TBEC
40.0000 mg | DELAYED_RELEASE_TABLET | Freq: Every day | ORAL | Status: DC
  Administered 2016-02-11 – 2016-02-15 (×5): 40 mg via ORAL
  Filled 2016-02-10 (×5): qty 1

## 2016-02-10 MED ORDER — HALOPERIDOL LACTATE 5 MG/ML IJ SOLN
1.0000 mg | Freq: Once | INTRAMUSCULAR | Status: AC
  Administered 2016-02-10: 1 mg via INTRAVENOUS
  Filled 2016-02-10: qty 1

## 2016-02-10 MED ORDER — AMIODARONE HCL IN DEXTROSE 360-4.14 MG/200ML-% IV SOLN
INTRAVENOUS | Status: AC
  Administered 2016-02-10: 22:00:00
  Filled 2016-02-10: qty 400

## 2016-02-10 MED ORDER — SODIUM CHLORIDE 0.9 % WEIGHT BASED INFUSION: 1.0000 mL/kg/h | INTRAVENOUS | Status: AC

## 2016-02-10 MED ORDER — SODIUM CHLORIDE 0.9 % IV SOLN
30.0000 meq | Freq: Once | INTRAVENOUS | Status: AC
  Administered 2016-02-10: 30 meq via INTRAVENOUS
  Filled 2016-02-10: qty 15

## 2016-02-10 MED ORDER — BIVALIRUDIN BOLUS VIA INFUSION - CUPID
INTRAVENOUS | Status: DC | PRN
  Administered 2016-02-10: 64.65 mg via INTRAVENOUS

## 2016-02-10 MED ORDER — FENTANYL CITRATE (PF) 100 MCG/2ML IJ SOLN
INTRAMUSCULAR | Status: AC
  Filled 2016-02-10: qty 2

## 2016-02-10 MED ORDER — ASPIRIN EC 81 MG PO TBEC
81.0000 mg | DELAYED_RELEASE_TABLET | Freq: Every day | ORAL | Status: DC
Start: 2016-02-10 — End: 2016-02-10

## 2016-02-10 MED ORDER — ALPRAZOLAM 0.25 MG PO TABS
0.2500 mg | ORAL_TABLET | Freq: Two times a day (BID) | ORAL | Status: DC | PRN
  Administered 2016-02-10: 0.25 mg via ORAL
  Filled 2016-02-10: qty 1

## 2016-02-10 MED ORDER — TICAGRELOR 90 MG PO TABS
90.0000 mg | ORAL_TABLET | Freq: Two times a day (BID) | ORAL | Status: DC
  Administered 2016-02-10 – 2016-02-14 (×8): 90 mg via ORAL
  Filled 2016-02-10 (×8): qty 1

## 2016-02-10 MED ORDER — POTASSIUM CHLORIDE CRYS ER 20 MEQ PO TBCR
80.0000 meq | EXTENDED_RELEASE_TABLET | ORAL | Status: AC
  Administered 2016-02-10: 80 meq via ORAL
  Filled 2016-02-10: qty 4

## 2016-02-10 MED ORDER — SODIUM CHLORIDE 0.9 % IV SOLN
2.0000 g | Freq: Once | INTRAVENOUS | Status: AC
  Administered 2016-02-10: 2 g via INTRAVENOUS
  Filled 2016-02-10: qty 20

## 2016-02-10 MED ORDER — HEPARIN (PORCINE) IN NACL 100-0.45 UNIT/ML-% IJ SOLN: 18.0000 [IU]/kg/h | Freq: Once | INTRAMUSCULAR | Status: DC

## 2016-02-10 MED ORDER — MAGNESIUM SULFATE 2 GM/50ML IV SOLN: 2.0000 g | Freq: Once | INTRAVENOUS | Status: DC

## 2016-02-10 MED ORDER — HEPARIN (PORCINE) IN NACL 2-0.9 UNIT/ML-% IJ SOLN
INTRAMUSCULAR | Status: DC | PRN
  Administered 2016-02-10: 1500 mL

## 2016-02-10 MED ORDER — AMIODARONE HCL IN DEXTROSE 360-4.14 MG/200ML-% IV SOLN
30.0000 mg/h | INTRAVENOUS | Status: DC
  Administered 2016-02-11 – 2016-02-12 (×3): 30 mg/h via INTRAVENOUS
  Filled 2016-02-10 (×3): qty 200

## 2016-02-10 MED ORDER — ASPIRIN 81 MG PO CHEW
324.0000 mg | CHEWABLE_TABLET | Freq: Once | ORAL | Status: AC
  Administered 2016-02-10: 324 mg via ORAL

## 2016-02-10 MED ORDER — SODIUM CHLORIDE 0.9 % IV SOLN
INTRAVENOUS | Status: DC | PRN
  Administered 2016-02-10: 999 mL via INTRAVENOUS

## 2016-02-10 MED ORDER — FENTANYL CITRATE (PF) 100 MCG/2ML IJ SOLN
INTRAMUSCULAR | Status: DC | PRN
  Administered 2016-02-10: 25 ug via INTRAVENOUS

## 2016-02-10 MED ORDER — NITROGLYCERIN 1 MG/10 ML FOR IR/CATH LAB
INTRA_ARTERIAL | Status: DC | PRN
  Administered 2016-02-10: 200 ug via INTRACORONARY

## 2016-02-10 MED ORDER — HYDRALAZINE HCL 20 MG/ML IJ SOLN: 10.0000 mg | INTRAMUSCULAR | Status: DC | PRN

## 2016-02-10 MED ORDER — IOPAMIDOL (ISOVUE-370) INJECTION 76%
INTRAVENOUS | Status: DC | PRN
  Administered 2016-02-10: 185 mL via INTRA_ARTERIAL

## 2016-02-10 MED ORDER — SODIUM CHLORIDE 0.9 % IV SOLN: 250.0000 mL | INTRAVENOUS | Status: DC | PRN

## 2016-02-10 MED ORDER — LIDOCAINE HCL (PF) 1 % IJ SOLN
INTRAMUSCULAR | Status: DC | PRN
  Administered 2016-02-10: 15 mL via INTRADERMAL

## 2016-02-10 MED ORDER — ASPIRIN 81 MG PO CHEW
81.0000 mg | CHEWABLE_TABLET | Freq: Every day | ORAL | Status: DC
  Administered 2016-02-11 – 2016-02-15 (×4): 81 mg via ORAL
  Filled 2016-02-10 (×4): qty 1

## 2016-02-10 MED ORDER — SODIUM CHLORIDE 0.9% FLUSH
3.0000 mL | Freq: Two times a day (BID) | INTRAVENOUS | Status: DC
  Administered 2016-02-11 – 2016-02-13 (×6): 3 mL via INTRAVENOUS

## 2016-02-10 SURGICAL SUPPLY — 18 items
BALLN EUPHORA RX 3.0X15 (BALLOONS) ×2
BALLN MOZEC 2.0X12 (BALLOONS) ×2
BALLOON EUPHORA RX 3.0X15 (BALLOONS) ×1 IMPLANT
BALLOON MOZEC 2.0X12 (BALLOONS) ×1 IMPLANT
CATH INFINITI 5FR MULTPACK ANG (CATHETERS) ×2 IMPLANT
CATH VISTA GUIDE 6FR JR4 (CATHETERS) ×2 IMPLANT
ELECT DEFIB PAD ADLT CADENCE (PAD) ×2 IMPLANT
KIT ENCORE 26 ADVANTAGE (KITS) ×2 IMPLANT
KIT HEART LEFT (KITS) ×2 IMPLANT
PACK CARDIAC CATHETERIZATION (CUSTOM PROCEDURE TRAY) ×2 IMPLANT
SHEATH PINNACLE 6F 10CM (SHEATH) ×2 IMPLANT
STENT SYNERGY DES 3.5X16 (Permanent Stent) ×2 IMPLANT
SYR MEDRAD MARK V 150ML (SYRINGE) ×2 IMPLANT
TRANSDUCER W/STOPCOCK (MISCELLANEOUS) ×2 IMPLANT
TUBING CIL FLEX 10 FLL-RA (TUBING) ×2 IMPLANT
WIRE ASAHI PROWATER 180CM (WIRE) ×2 IMPLANT
WIRE EMERALD 3MM-J .035X150CM (WIRE) ×2 IMPLANT
WIRE HI TORQ VERSACORE-J 145CM (WIRE) ×2 IMPLANT

## 2016-02-10 NOTE — ED Notes (Signed)
Pharmacy called and notified of need of heparin drip.

## 2016-02-10 NOTE — Progress Notes (Signed)
     Called by Herbert Setaheather, RN for hypertension and agitation. He was trying to pull out IVs and take off gown. Disoriented. Ativan 0.25 did not help but IV morphine did help calm him down a little and helped pressure. Will start IV haldol 1 mg once to see if this helps agitation and increase ativan (although using benzos with caution in elderly man with dementia.) Will also give IV hydralazine as needed for BPs.   Cline CrockKathryn Nhu Glasby PA-C  [facility]

## 2016-02-10 NOTE — ED Provider Notes (Signed)
[facility] Emergency Department Provider Note  ____________________________________________  Time seen: Approximately 12:26 PM  I have reviewed the triage vital signs and the nursing notes.   HISTORY  Chief Complaint Extremity Weakness  Level 5 caveat:  Portions of the history and physical were unable to be obtained due to dementia   HPI Jeff Key is a 70 y.o. male with a history of Alzheimer's who presents for evaluation of chest pain. Patient started having chest pain earlier this morning that he describes as a coldness sensation on his chest associated with shortness of breath. His wife then noticed that he became very weak and was unable to walk and that's when patient was brought to the emergency room. Patient continues to report mild chest pressure, located substernally, non radiating, associated with SOB. Patient has no personal or family history of ischemic heart disease, no history of smoking.  Past Medical History:  Diagnosis Date  . Arthritis   . Asthma   . GERD (gastroesophageal reflux disease)     Patient Active Problem List   Diagnosis Date Noted  . Sepsis (HCC) 11/29/2015  . Abnormal urinalysis 10/16/2014    Past Surgical History:  Procedure Laterality Date  . ARM Injury Repair Right 1985  . COLONOSCOPY WITH PROPOFOL N/A 05/06/2015   Procedure: COLONOSCOPY WITH PROPOFOL;  Surgeon: Wallace Cullens, MD;  Location: [facility] ENDOSCOPY;  Service: Gastroenterology;  Laterality: N/A;  . ESOPHAGOGASTRODUODENOSCOPY (EGD) WITH PROPOFOL N/A 05/06/2015   Procedure: ESOPHAGOGASTRODUODENOSCOPY (EGD) WITH PROPOFOL;  Surgeon: Wallace Cullens, MD;  Location: [facility] ENDOSCOPY;  Service: Gastroenterology;  Laterality: N/A;  . FRACTURE SURGERY      Prior to Admission medications   Medication Sig Start Date End Date Taking? Authorizing Provider  aspirin EC 81 MG tablet Take 81 mg by mouth daily.    Historical Provider, MD  donepezil (ARICEPT) 5 MG tablet Take 5  mg by mouth at bedtime.    Historical Provider, MD  omeprazole (PRILOSEC) 20 MG capsule Take 20 mg by mouth daily.    Historical Provider, MD  sulfaSALAzine (AZULFIDINE) 500 MG EC tablet Take 1,000 mg by mouth 2 (two) times daily.    Historical Provider, MD  vitamin B-12 (CYANOCOBALAMIN) 1000 MCG tablet Take 1,000 mcg by mouth daily.    Historical Provider, MD    Allergies Patient has no known allergies.  Family History  Problem Relation Age of Onset  . Prostate cancer Father   . Bladder Cancer Neg Hx   . Kidney cancer Neg Hx     Social History Social History  Substance Use Topics  . Smoking status: Former Games developer  . Smokeless tobacco: Never Used  . Alcohol use No    Review of Systems Constitutional: Negative for fever. Eyes: Negative for visual changes. ENT: Negative for sore throat. Neck: No neck pain  Cardiovascular: + chest pain. Respiratory: + shortness of breath. Gastrointestinal: Negative for abdominal pain, vomiting or diarrhea. Genitourinary: Negative for dysuria. Musculoskeletal: Negative for back pain. Skin: Negative for rash. Neurological: Negative for headaches, weakness or numbness. Psych: No SI or HI  ____________________________________________   PHYSICAL EXAM:  VITAL SIGNS: ED Triage Vitals [02/10/16 1207]  Enc Vitals Group     BP (!) 162/105     Pulse Rate 77     Resp (!) 27     Temp 97.7 F (36.5 C)     Temp Source Oral     SpO2 100 %     Weight 190 lb (86.2  kg)     Height 6\' 1"  (1.854 m)     Head Circumference      Peak Flow      Pain Score      Pain Loc      Pain Edu?      Excl. in GC?     Constitutional: Alert and oriented x 1. Well appearing and in no apparent distress. HEENT:      Head: Normocephalic and atraumatic.         Eyes: Conjunctivae are normal. Sclera is non-icteric. EOMI. PERRL      Mouth/Throat: Mucous membranes are moist.       Neck: Supple with no signs of meningismus. Cardiovascular: Regular rate and rhythm. No  murmurs, gallops, or rubs. 2+ symmetrical distal pulses are present in all extremities. No JVD. Respiratory: Normal respiratory effort. Lungs are clear to auscultation bilaterally. No wheezes, crackles, or rhonchi.  Gastrointestinal: Soft, non tender, and non distended with positive bowel sounds. No rebound or guarding. Musculoskeletal: Nontender with normal range of motion in all extremities. No edema, cyanosis, or erythema of extremities. Neurologic: Normal speech and language. Face is symmetric. Moving all extremities. No gross focal neurologic deficits are appreciated. Skin: Skin is warm, dry and intact. No rash noted. Psychiatric: Mood and affect are normal. Speech and behavior are normal.  ____________________________________________   LABS (all labs ordered are listed, but only abnormal results are displayed)  Labs Reviewed  CBC - Abnormal; Notable for the following:       Result Value   RDW 16.6 (*)    All other components within normal limits  TROPONIN I  BASIC METABOLIC PANEL  PROTIME-INR   ____________________________________________  EKG  ED ECG REPORT I, Nita Sicklearolina Krisandra Bueno, the attending physician, personally viewed and interpreted this ECG.  Normal sinus rhythm, rate of 75, normal intervals, normal axis, ST elevations in the inferior leads with reciprocal changes concerning for inferior STEMI. Changes are new when compared to prior EKG.  ____________________________________________  RADIOLOGY  none  ____________________________________________   PROCEDURES  Procedure(s) performed: None Procedures Critical Care performed: yes  CRITICAL CARE Performed by: Nita Sicklearolina Aseel Uhde  ?  Total critical care time: 30 min  Critical care time was exclusive of separately billable procedures and treating other patients.  Critical care was necessary to treat or prevent imminent or life-threatening deterioration.  Critical care was time spent personally by me on the  following activities: development of treatment plan with patient and/or surrogate as well as nursing, discussions with consultants, evaluation of patient's response to treatment, examination of patient, obtaining history from patient or surrogate, ordering and performing treatments and interventions, ordering and review of laboratory studies, ordering and review of radiographic studies, pulse oximetry and re-evaluation of patient's condition.  ____________________________________________   INITIAL IMPRESSION / ASSESSMENT AND PLAN / ED COURSE   70 y.o. male with a history of Alzheimer's who presents for evaluation of chest pain. Patient found to have a STEMI on EKG upon arrival. Code STEMI was activated. Patient was given a full dose of aspirin and was started on heparin bolus and drip. No nitroglycerin was given due to concern of possible RV involvement on an inferior STEMI. I spoke with Dr[facility] Allyson SabalBerry cardiology [facility] lab. EMS was called immediately for transportation. Patient stable with normal VS.  Clinical Course    _________________________ 12:46 PM on 02/10/2016 -----------------------------------------  [facility] condition.  Pertinent labs & imaging results that were available during my care of the patient were reviewed by me and considered in my medical decision making (see chart for details).    ____________________________________________   FINAL CLINICAL IMPRESSION(S) / ED DIAGNOSES  Final diagnoses:  ST elevation myocardial infarction (STEMI), unspecified artery (HCC)      NEW MEDICATIONS STARTED DURING THIS VISIT:  New Prescriptions   No medications on file     Note:  This document was prepared using Dragon voice recognition software and may include unintentional dictation errors.    Nita Sicklearolina Zuleima Haser, MD 02/10/16 1247

## 2016-02-10 NOTE — ED Notes (Signed)
This RN will transfer with patient due to Heparin drip.

## 2016-02-10 NOTE — H&P (Signed)
History & Physical    Patient ID: Jeff Key MRN: [id number], DOB/AGE: 1946-02-11   Admit date: (Not on file)   Primary Physician: [name], MD Primary Cardiologist: New - Dr. Allyson Sabal   History of Present Illness    Jeff Key is a 69 y.o. male with past medical history of asthma, GERD, and recent diagnosis of Alzheimer's disease who presented to [facility] on 02/10/2016 for evaluation of generalized weakness and chest discomfort.   He was recently admitted from 9/10 - 11/30/2015 for PNA and hyponatremia. At the time of hospital discharge, labs had improved with a NA+ of 130, K+ 3.7, and creatinine 0.83.  Appears he had been doing well until earlier this morning when he awoke with chest discomfort. Reported associated dyspnea and diaphoresis. He was very weak and unable to walk initially.   Upon arrival to [facility] ED, he reported having continual chest discomfort. His initial EKG  Showed ST elevation in there inferior leads. CODE STEMI was activated and he was transferred to [facility] for emergent cardiac catheterization.   The patient reports he is still having chest discomfort at this time. He cannot recall when the pain started. Unable to contribute to history secondary to dementia.   Past Medical History    Past Medical History:  Diagnosis Date  . Arthritis   . Asthma   . GERD (gastroesophageal reflux disease)     Past Surgical History:  Procedure Laterality Date  . ARM Injury Repair Right 1985  . COLONOSCOPY WITH PROPOFOL N/A 05/06/2015   Procedure: COLONOSCOPY WITH PROPOFOL;  Surgeon: Wallace Cullens, MD;  Location: [facility] ENDOSCOPY;  Service: Gastroenterology;  Laterality: N/A;  . ESOPHAGOGASTRODUODENOSCOPY (EGD) WITH PROPOFOL N/A 05/06/2015   Procedure: ESOPHAGOGASTRODUODENOSCOPY (EGD) WITH PROPOFOL;  Surgeon: Wallace Cullens, MD;  Location: [facility] ENDOSCOPY;  Service: Gastroenterology;  Laterality: N/A;  . FRACTURE SURGERY       Allergies  No Known Allergies   Home  Medications    Prior to Admission medications   Medication Sig Start Date End Date Taking? Authorizing Provider  aspirin EC 81 MG tablet Take 81 mg by mouth daily.    Historical Provider, MD  donepezil (ARICEPT) 5 MG tablet Take 5 mg by mouth at bedtime.    Historical Provider, MD  omeprazole (PRILOSEC) 20 MG capsule Take 20 mg by mouth daily.    Historical Provider, MD  sulfaSALAzine (AZULFIDINE) 500 MG EC tablet Take 1,000 mg by mouth 2 (two) times daily.    Historical Provider, MD  vitamin B-12 (CYANOCOBALAMIN) 1000 MCG tablet Take 1,000 mcg by mouth daily.    Historical Provider, MD    Family History    Family History  Problem Relation Age of Onset  . Prostate cancer Father   . Bladder Cancer Neg Hx   . Kidney cancer Neg Hx     Social History    Social History   Social History  . Marital status: Married    Spouse name: N/A  . Number of children: N/A  . Years of education: N/A   Occupational History  . Not on file.   Social History Main Topics  . Smoking status: Former Games developer  . Smokeless tobacco: Never Used  . Alcohol use No  . Drug use: No  . Sexual activity: Not on file   Other Topics Concern  . Not on file   Social History Narrative  . No narrative on file     Review of Systems  General:  No chills, fever, night sweats or weight changes.  Cardiovascular:  No edema, orthopnea, palpitations, paroxysmal nocturnal dyspnea. Positive for chest pain and dyspnea on exertion.  Dermatological: No rash, lesions/masses Respiratory: No cough, dyspnea Urologic: No hematuria, dysuria Abdominal:   No nausea, vomiting, diarrhea, bright red blood per rectum, melena, or hematemesis Neurologic:  No visual changes, wkns, changes in mental status. All other systems reviewed and are otherwise negative except as noted above.  Physical Exam    There were no vitals taken for this visit.  General: Well developed, well nourished,male appearing in no acute distress. Head:  Normocephalic, atraumatic, sclera non-icteric, no xanthomas, nares are without discharge. Dentition:  Neck: No carotid bruits. JVD not elevated.  Lungs: Respirations regular and unlabored, without wheezes or rales.  Heart: Regular rate and rhythm. No S3 or S4.  No murmur, no rubs, or gallops appreciated. Abdomen: Soft, non-tender, non-distended with normoactive bowel sounds. No hepatomegaly. No rebound/guarding. No obvious abdominal masses. Msk:  Strength and tone appear normal for age. No joint deformities or effusions. Extremities: No clubbing or cyanosis. No edema.  Distal pedal pulses are 2+ bilaterally. Neuro: Alert and oriented X 3. Moves all extremities spontaneously. No focal deficits noted. Psych:  Responds to questions appropriately with a normal affect. Skin: No rashes or lesions noted  Labs    Troponin (Point of Care Test) No results for input(s): TROPIPOC in the last 72 hours. No results for input(s): CKTOTAL, CKMB, TROPONINI in the last 72 hours. Lab Results  Component Value Date   WBC 9.4 02/10/2016   HGB 14.5 02/10/2016   HCT 42.4 02/10/2016   MCV 84.4 02/10/2016   PLT 277 02/10/2016   No results for input(s): NA, K, CL, CO2, BUN, CREATININE, CALCIUM, PROT, BILITOT, ALKPHOS, ALT, AST, GLUCOSE in the last 168 hours.  Invalid input(s): LABALBU No results found for: CHOL, HDL, LDLCALC, TRIG No results found for: DDIMER  No results found for: BNP No results found for: PROBNP No results for input(s): INR in the last 72 hours.    Radiology Studies    Mr Brain 36Wo Contrast  Result Date: 01/21/2016 CLINICAL DATA:  70 year old male with memory impairment and confusion. Progressive symptoms for 1 year, worst in the last 2-3 weeks. Initial encounter. EXAM: MRI HEAD WITHOUT CONTRAST TECHNIQUE: Multiplanar, multiecho pulse sequences of the brain and surrounding structures were obtained without intravenous contrast. COMPARISON:  Head CT without contrast 11/28/2015. Brain MRI  10/01/2014. FINDINGS: Brain: No restricted diffusion to suggest acute infarction. No midline shift, mass effect, evidence of mass lesion, ventriculomegaly, extra-axial collection or acute intracranial hemorrhage. Cervicomedullary junction and pituitary are within normal limits. There has been mild additional generalized cerebral volume loss since July 2016. Stable gray and white matter signal throughout the brain. Scattered and patchy bilateral cerebral white matter T2 and FLAIR hyperintensity, which in some areas most resembles chronic white matter lacunar infarcts. Involvement of the deep white matter capsules. No cortical encephalomalacia or chronic cerebral blood products identified. Mild for age T2 heterogeneity in the deep gray matter nuclei which might in part reflect perivascular spaces. Negative brainstem and cerebellum. Vascular: Major intracranial vascular flow voids are stable with loss of the distal right vertebral artery flow void as noted in 2016. Somewhat dominant appearing distal left vertebral artery. Skull and upper cervical spine: Negative. Normal bone marrow signal. Sinuses/Orbits: Normal orbit soft tissues. Mild left maxillary sinus increased mucosal thickening. Other Visualized paranasal sinuses and mastoids are stable and well pneumatized. Other: Visible  internal auditory structures appear normal. Negative scalp soft tissues. IMPRESSION: 1. No acute intracranial abnormality. Mild additional generalized cerebral volume loss since 2016. 2. Otherwise stable noncontrast MRI appearance of the brain including moderate for age signal changes in the cerebral white matter and deep gray matter most commonly due to chronic small vessel disease. 3. 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 Electronically Signed   By: Odessa FlemingH  Hall M.D.   On: 01/21/2016 10:14    EKG & Cardiac Imaging    EKG: NSR with ST elevation in the inferior leads. No reciprocal ST depression noted.    ECHOCARDIOGRAM: None on File  Assessment & Plan    1. Acute Inferior STEMI - patient denies any prior cardiac history. Awoke with chest discomfort this AM and associated dyspnea and diaphoresis.  - [facility] in the inferior leads. CODE STEMI was activated and he was [facility] emergent cardiac catheter[facility].  - further recommendations pending cath results. Check FLP for risk stratification. Will need to initiate high-intensity statin therapy and BB.   2. Dementia - A&Ox3 on exam but unable to contribute to medical history. - continue PTA Aricept.   Signed, Ellsworth LennoxBrittany M Mikinzie Maciejewski, PA-C 02/10/2016, 12:50 PM Pager: [phone]

## 2016-02-10 NOTE — ED Triage Notes (Signed)
Pt presents to ER with c/o lower extremity weakness and chest mucous. Pt recently diagnosed with dementia. Wife states pt is at baseline. Pt had trouble walking this AM and had firefighters help him to get into truck to come to ER. Patient is A&O to person, place. Disoriented to time.

## 2016-02-10 NOTE — Progress Notes (Signed)
Orthopedic Tech Progress Note Patient Details:  Jeff DallasRoger H Key [birth year] [id number]  Ortho Devices Type of Ortho Device: Knee Immobilizer Ortho Device/Splint Location: RLE Ortho Device/Splint Interventions: Ordered, Application   Jennye MoccasinHughes, Aidel Davisson Craig 02/10/2016, 4:12 PM

## 2016-02-10 NOTE — ED Notes (Signed)
Zoll pads placed on patient.  

## 2016-02-10 NOTE — ED Notes (Signed)
Supplemental O2 applied to patient. 2L Jeff Key.

## 2016-02-10 NOTE — Progress Notes (Signed)
Visited with large circle of extended family, providing emotional/spiritual support and praying with them for pt's health and healing. After a visit to cath lab, pt transported to 2S, where he was not yet able to talk when stopped by later.  Chaplain available for follow-up.    02/10/16 1600  Clinical Encounter Type  Visited With Family  Visit Type Initial;Psychological support;Spiritual support;Social support  Referral From Nurse  Spiritual Encounters  Spiritual Needs Prayer;Emotional  Stress Factors  Patient Stress Factors Health changes  Family Stress Factors Family relationships;Health changes  .Ephraim Hamburgerynthia A Fariha Goto, Chaplain

## 2016-02-10 NOTE — ED Notes (Signed)
EMS here to transport patient

## 2016-02-10 NOTE — Progress Notes (Signed)
Right femoral sheath removed per protocol.  Pressure held for 25 minutes beginning at 2245. Site is a level 0 post sheath pull. A pressure dressing was applied. Patient tolerated the procedure well.  Post sheath pull instructions given to the patient and family.  All voice understanding to teaching.     Maryfrances BunnellSantanella, Jaiquan Temme J, RN

## 2016-02-10 NOTE — Progress Notes (Signed)
ANTICOAGULATION CONSULT NOTE - Initial Consult  Pharmacy Consult for Heparin Indication: chest pain/ACS  No Known Allergies  Patient Measurements: Height: 6\' 1"  (185.4 cm) Weight: 190 lb (86.2 kg) IBW/kg (Calculated) : 79.9 Heparin Dosing Weight: 86.2 kg  Vital Signs: Temp: 97.7 F (36.5 C) (11/23 1207) Temp Source: Oral (11/23 1207) BP: 162/105 (11/23 1207) Pulse Rate: 77 (11/23 1207)  Labs:  Recent Labs  02/10/16 1210  HGB 14.5  HCT 42.4  PLT 277  LABPROT 13.9  INR 1.07  CREATININE 0.97  TROPONINI 0.21*    Estimated Creatinine Clearance: 78.9 mL/min (by C-G formula based on SCr of 0.97 mg/dL).   Medical History: Past Medical History:  Diagnosis Date  . Arthritis   . Asthma   . GERD (gastroesophageal reflux disease)     Assessment: 70 y/o M with a h/o Alzheimer's admitted with CP ordered heparin drip for STEMI.   Goal of Therapy:  Heparin level 0.3-0.7 units/ml Monitor platelets by anticoagulation protocol: Yes   Plan:  Give 4000 units bolus x 1 Start heparin infusion at 1200 units/hr Check anti-Xa level in 8 hours and daily while on heparin Continue to monitor H&H and platelets  Luisa HartChristy, Jeff Key D 02/10/2016,1:23 PM

## 2016-02-10 NOTE — ED Notes (Signed)
Called [facility] for Stemi spoke to Dole FoodPhil [phone]

## 2016-02-11 ENCOUNTER — Other Ambulatory Visit (HOSPITAL_COMMUNITY): Payer: PPO

## 2016-02-11 ENCOUNTER — Encounter (HOSPITAL_COMMUNITY): Payer: Self-pay | Admitting: Cardiovascular Disease

## 2016-02-11 DIAGNOSIS — I472 Ventricular tachycardia: Secondary | ICD-10-CM

## 2016-02-11 LAB — CBC
HCT: 37.9 % — ABNORMAL LOW (ref 39.0–52.0)
HEMOGLOBIN: 12.4 g/dL — AB (ref 13.0–17.0)
MCH: 27.7 pg (ref 26.0–34.0)
MCHC: 32.7 g/dL (ref 30.0–36.0)
MCV: 84.6 fL (ref 78.0–100.0)
Platelets: 281 10*3/uL (ref 150–400)
RBC: 4.48 MIL/uL (ref 4.22–5.81)
RDW: 16.1 % — ABNORMAL HIGH (ref 11.5–15.5)
WBC: 13.9 10*3/uL — ABNORMAL HIGH (ref 4.0–10.5)

## 2016-02-11 LAB — TROPONIN I: Troponin I: 40.32 ng/mL (ref <0.03)

## 2016-02-11 LAB — HEPARIN LEVEL (UNFRACTIONATED): HEPARIN UNFRACTIONATED: 0.39 [IU]/mL (ref 0.30–0.70)

## 2016-02-11 LAB — BASIC METABOLIC PANEL
ANION GAP: 8 (ref 5–15)
BUN: 8 mg/dL (ref 6–20)
CALCIUM: 9 mg/dL (ref 8.9–10.3)
CO2: 23 mmol/L (ref 22–32)
Chloride: 103 mmol/L (ref 101–111)
Creatinine, Ser: 0.8 mg/dL (ref 0.61–1.24)
GLUCOSE: 128 mg/dL — AB (ref 65–99)
Potassium: 4.5 mmol/L (ref 3.5–5.1)
Sodium: 134 mmol/L — ABNORMAL LOW (ref 135–145)

## 2016-02-11 LAB — POCT ACTIVATED CLOTTING TIME: ACTIVATED CLOTTING TIME: 362 s

## 2016-02-11 LAB — MAGNESIUM: Magnesium: 2.8 mg/dL — ABNORMAL HIGH (ref 1.7–2.4)

## 2016-02-11 MED ORDER — ATORVASTATIN CALCIUM 80 MG PO TABS
80.0000 mg | ORAL_TABLET | Freq: Every day | ORAL | Status: DC
  Administered 2016-02-11 – 2016-02-13 (×3): 80 mg via ORAL
  Filled 2016-02-11 (×3): qty 1

## 2016-02-11 MED ORDER — HEPARIN (PORCINE) IN NACL 100-0.45 UNIT/ML-% IJ SOLN
1200.0000 [IU]/h | INTRAMUSCULAR | Status: DC
  Administered 2016-02-11 – 2016-02-13 (×4): 1200 [IU]/h via INTRAVENOUS
  Filled 2016-02-11 (×4): qty 250

## 2016-02-11 MED ORDER — METOPROLOL TARTRATE 12.5 MG HALF TABLET
12.5000 mg | ORAL_TABLET | Freq: Two times a day (BID) | ORAL | Status: DC
  Administered 2016-02-11 – 2016-02-12 (×3): 12.5 mg via ORAL
  Filled 2016-02-11 (×3): qty 1

## 2016-02-11 NOTE — Progress Notes (Signed)
ANTICOAGULATION CONSULT NOTE  Pharmacy Consult for Heparin Indication: chest pain/ACS  No Known Allergies  Patient Measurements:   Heparin Dosing Weight: 86.2 kg  Vital Signs: Temp: 97.6 F (36.4 C) (11/24 0400) Temp Source: Oral (11/24 0400) BP: 122/87 (11/24 0700) Pulse Rate: 63 (11/24 0700)  Labs:  Recent Labs  02/10/16 1210 02/10/16 1744 02/10/16 2014 02/11/16 0324  HGB 14.5  --   --  12.4*  HCT 42.4  --   --  37.9*  PLT 277  --   --  281  APTT 40*  --   --   --   LABPROT 13.9  --   --   --   INR 1.07  --   --   --   CREATININE 0.97  --  0.46* 0.80  TROPONINI 0.21* 24.68* 22.16* 40.32*    Estimated Creatinine Clearance: 95.7 mL/min (by C-G formula based on SCr of 0.8 mg/dL).   Medical History: Past Medical History:  Diagnosis Date  . Arthritis   . Asthma   . GERD (gastroesophageal reflux disease)     Assessment: 70 y/o M with a h/o Alzheimer's admitted with CP with STEMI and s/p cath (12/23) with DES to RCA for staged PCI to LAD/LCx on Monday.  -her was on heparin pre-cath at 1200 units/hr (no levels available prior to cath)  Goal of Therapy:  Heparin level 0.3-0.7 units/ml Monitor platelets by anticoagulation protocol: Yes   Plan:  -Begin heparin at 1200 units/hr -Heparin level in 8 hours and daily wth CBC daily  Harland GermanAndrew Nanami Whitelaw, Pharm D 02/11/2016 8:10 AM

## 2016-02-11 NOTE — Progress Notes (Signed)
Subjective:  POD # 1 Inferior STEMI Rx with DES prox Dom RCA. Residual LAD/LCX disease. No further CP/SOB. For staged PCI.   Objective:  Temp:  [96.1 F (35.6 C)-98.7 F (37.1 C)] 97.6 F (36.4 C) (11/24 0400) Pulse Rate:  [0-112] 63 (11/24 0700) Resp:  [0-30] 10 (11/24 0700) BP: (90-162)/(54-117) 122/87 (11/24 0700) SpO2:  [0 %-100 %] 100 % (11/24 0700) Arterial Line BP: (77-180)/(46-95) 77/51 (11/23 2200) Weight:  [190 lb (86.2 kg)] 190 lb (86.2 kg) (11/23 1207) Weight change:   Intake/Output from previous day: 11/23 0701 - 11/24 0700 In: 1966.9 [P.O.:100; I.V.:1116.9; IV Piggyback:750] Out: 2841 [Urine:1725]  Intake/Output from this shift: No intake/output data recorded.  Physical Exam: General appearance: alert and no distress Neck: no adenopathy, no carotid bruit, no JVD, supple, symmetrical, trachea midline and thyroid not enlarged, symmetric, no tenderness/mass/nodules Lungs: clear to auscultation bilaterally Heart: regular rate and rhythm, S1, S2 normal, no murmur, click, rub or gallop Extremities: Right groin OK  Lab Results: Results for orders placed or performed during the hospital encounter of 02/10/16 (from the past 48 hour(s))  Troponin I (q 6hr x 3)     Status: Abnormal   Collection Time: 02/10/16  5:44 PM  Result Value Ref Range   Troponin I 24.68 (HH) <0.03 ng/mL    Comment: CRITICAL RESULT CALLED TO, READ BACK BY AND VERIFIED WITH: H.JOB,RN 02/10/16 @1916  BY V.WILKINS   POCT Activated clotting time     Status: None   Collection Time: 02/10/16  5:56 PM  Result Value Ref Range   Activated Clotting Time 158 seconds  Troponin I (q 6hr x 3)     Status: Abnormal   Collection Time: 02/10/16  8:14 PM  Result Value Ref Range   Troponin I 22.16 (HH) <0.03 ng/mL    Comment: CRITICAL VALUE NOTED.  VALUE IS CONSISTENT WITH PREVIOUSLY REPORTED AND CALLED VALUE.  Basic metabolic panel     Status: Abnormal   Collection Time: 02/10/16  8:14 PM  Result  Value Ref Range   Sodium 148 (H) 135 - 145 mmol/L   Potassium 2.3 (LL) 3.5 - 5.1 mmol/L    Comment: REPEATED TO VERIFY CRITICAL RESULT CALLED TO, READ BACK BY AND VERIFIED WITH: BROWN M,RN 02/10/16 2117 WAYK    Chloride 117 (H) 101 - 111 mmol/L   CO2 12 (L) 22 - 32 mmol/L   Glucose, Bld 94 65 - 99 mg/dL   BUN 7 6 - 20 mg/dL   Creatinine, Ser 0.46 (L) 0.61 - 1.24 mg/dL   Calcium 5.7 (LL) 8.9 - 10.3 mg/dL    Comment: REPEATED TO VERIFY CRITICAL RESULT CALLED TO, READ BACK BY AND VERIFIED WITH: BROWN M,RN 02/10/16 2117 WAYK    GFR calc non Af Amer >60 >60 mL/min   GFR calc Af Amer >60 >60 mL/min    Comment: (NOTE) The eGFR has been calculated using the CKD EPI equation. This calculation has not been validated in all clinical situations. eGFR's persistently <60 mL/min signify possible Chronic Kidney Disease.    Anion gap 19 (H) 5 - 15  Magnesium     Status: Abnormal   Collection Time: 02/10/16  8:14 PM  Result Value Ref Range   Magnesium 1.2 (L) 1.7 - 2.4 mg/dL  MRSA PCR Screening     Status: None   Collection Time: 02/10/16  8:47 PM  Result Value Ref Range   MRSA by PCR NEGATIVE NEGATIVE    Comment:  The GeneXpert MRSA Assay (FDA approved for NASAL specimens only), is one component of a comprehensive MRSA colonization surveillance program. It is not intended to diagnose MRSA infection nor to guide or monitor treatment for MRSA infections.   POCT Activated clotting time     Status: None   Collection Time: 02/10/16  8:53 PM  Result Value Ref Range   Activated Clotting Time 131 seconds  Troponin I (q 6hr x 3)     Status: Abnormal   Collection Time: 02/11/16  3:24 AM  Result Value Ref Range   Troponin I 40.32 (HH) <0.03 ng/mL    Comment: CRITICAL VALUE NOTED.  VALUE IS CONSISTENT WITH PREVIOUSLY REPORTED AND CALLED VALUE.  CBC     Status: Abnormal   Collection Time: 02/11/16  3:24 AM  Result Value Ref Range   WBC 13.9 (H) 4.0 - 10.5 K/uL   RBC 4.48 4.22 - 5.81  MIL/uL   Hemoglobin 12.4 (L) 13.0 - 17.0 g/dL   HCT 37.9 (L) 39.0 - 52.0 %   MCV 84.6 78.0 - 100.0 fL   MCH 27.7 26.0 - 34.0 pg   MCHC 32.7 30.0 - 36.0 g/dL   RDW 16.1 (H) 11.5 - 15.5 %   Platelets 281 150 - 400 K/uL  Basic metabolic panel     Status: Abnormal   Collection Time: 02/11/16  3:24 AM  Result Value Ref Range   Sodium 134 (L) 135 - 145 mmol/L    Comment: DELTA CHECK NOTED   Potassium 4.5 3.5 - 5.1 mmol/L    Comment: DELTA CHECK NOTED   Chloride 103 101 - 111 mmol/L   CO2 23 22 - 32 mmol/L   Glucose, Bld 128 (H) 65 - 99 mg/dL   BUN 8 6 - 20 mg/dL   Creatinine, Ser 0.80 0.61 - 1.24 mg/dL   Calcium 9.0 8.9 - 10.3 mg/dL    Comment: DELTA CHECK NOTED   GFR calc non Af Amer >60 >60 mL/min   GFR calc Af Amer >60 >60 mL/min    Comment: (NOTE) The eGFR has been calculated using the CKD EPI equation. This calculation has not been validated in all clinical situations. eGFR's persistently <60 mL/min signify possible Chronic Kidney Disease.    Anion gap 8 5 - 15  Magnesium     Status: Abnormal   Collection Time: 02/11/16  3:24 AM  Result Value Ref Range   Magnesium 2.8 (H) 1.7 - 2.4 mg/dL    Imaging: Imaging results have been reviewed  Tele- NSR with occasional PVCs   Assessment/Plan:   1. Active Problems: 2.   STEMI (ST elevation myocardial infarction) ([place]) 3.   STEMI involving oth coronary artery of inferior wall ([place]) 4. NSVT\ 5.   Time Spent Directly with Patient:  30 minutes  Length of Stay:  LOS: 1 day   POD # 1 Inf STEMI Rx with DES prox Dom RCA with residual LAD/LCX Dx. Trop rose to 40. Electrolytes repleted and Nl this AM (K & Mg). He was having runs of NSVT and PVCs no longer present. Remains on Amio IV. Pt denies CP/SOB. Exam benign. Groin OK. :Labs OK. Will start low dose BB and ACE-I BP permitting. Keep in ICU one additional day then Tx to Tele. Tentative plan for staged LAD/LCX intervention by myself on Monday. Will restart IV hep.  Quay Burow 02/11/2016, 7:53 AM

## 2016-02-11 NOTE — Progress Notes (Signed)
ANTICOAGULATION CONSULT NOTE - Follow Up Consult  Pharmacy Consult for Heparin Indication: LAD/LCx disease awaiting staged PCI  No Known Allergies  Patient Measurements:  Heparin Dosing Weight: 86.2kg  Vital Signs: Temp: 97.5 F (36.4 C) (11/24 1636) Temp Source: Oral (11/24 1636) BP: 136/74 (11/24 1600) Pulse Rate: 62 (11/24 1600)  Labs:  Recent Labs  02/10/16 1210 02/10/16 1744 02/10/16 2014 02/11/16 0324 02/11/16 1722  HGB 14.5  --   --  12.4*  --   HCT 42.4  --   --  37.9*  --   PLT 277  --   --  281  --   APTT 40*  --   --   --   --   LABPROT 13.9  --   --   --   --   INR 1.07  --   --   --   --   HEPARINUNFRC  --   --   --   --  0.39  CREATININE 0.97  --  0.46* 0.80  --   TROPONINI 0.21* 24.68* 22.16* 40.32*  --     Estimated Creatinine Clearance: 95.7 mL/min (by C-G formula based on SCr of 0.8 mg/dL).   Medications:  Heparin @ 1200 units/hr  Assessment: 70yom with inferior STEMI s/p DES to prox dom RCA was resumed on heparin for residual LAD/LCx disease. Staged PCI planned for Monday. Initial heparin level is therapeutic at 0.39. No bleeding reported.  Goal of Therapy:  Heparin level 0.3-0.7 units/ml Monitor platelets by anticoagulation protocol: Yes   Plan:  1) Continue heparin at 1200 units/hr 2) Daily heparin level and CBC  Fredrik RiggerMarkle, Kirstan Fentress Sue 02/11/2016,6:29 PM

## 2016-02-11 NOTE — Progress Notes (Signed)
CRITICAL VALUE ALERT  Critical value received:  K and Ca  Date of notification:  02/10/16  Time of notification: 2115  Critical value read back: yes  Nurse who received alert:  Arrie AranMacKayla Reanna Scoggin, RN  MD notified (1st page): MD Bensihmon  Time of first page: 2120  MD notified (2nd page):  Time of second page:  Responding MD:  MD Bensihmon  Time MD responded:  [phone]  Orders received and carried out. Will get repeat BMP and Mag after infusion has complete.

## 2016-02-12 ENCOUNTER — Encounter (HOSPITAL_COMMUNITY): Payer: Self-pay | Admitting: *Deleted

## 2016-02-12 ENCOUNTER — Inpatient Hospital Stay (HOSPITAL_COMMUNITY): Payer: PPO

## 2016-02-12 DIAGNOSIS — I2119 ST elevation (STEMI) myocardial infarction involving other coronary artery of inferior wall: Secondary | ICD-10-CM | POA: Diagnosis not present

## 2016-02-12 DIAGNOSIS — I2510 Atherosclerotic heart disease of native coronary artery without angina pectoris: Secondary | ICD-10-CM

## 2016-02-12 DIAGNOSIS — I2111 ST elevation (STEMI) myocardial infarction involving right coronary artery: Secondary | ICD-10-CM | POA: Diagnosis not present

## 2016-02-12 LAB — BASIC METABOLIC PANEL
Anion gap: 8 (ref 5–15)
BUN: 11 mg/dL (ref 6–20)
CALCIUM: 8.8 mg/dL — AB (ref 8.9–10.3)
CO2: 23 mmol/L (ref 22–32)
Chloride: 102 mmol/L (ref 101–111)
Creatinine, Ser: 0.93 mg/dL (ref 0.61–1.24)
GFR calc Af Amer: >60 mL/min (ref >60)
GLUCOSE: 103 mg/dL — AB (ref 65–99)
Potassium: 3.7 mmol/L (ref 3.5–5.1)
Sodium: 133 mmol/L — ABNORMAL LOW (ref 135–145)

## 2016-02-12 LAB — CBC
HCT: 36.8 % — ABNORMAL LOW (ref 39.0–52.0)
Hemoglobin: 11.8 g/dL — ABNORMAL LOW (ref 13.0–17.0)
MCH: 27.3 pg (ref 26.0–34.0)
MCHC: 32.1 g/dL (ref 30.0–36.0)
MCV: 85 fL (ref 78.0–100.0)
PLATELETS: 260 10*3/uL (ref 150–400)
RBC: 4.33 MIL/uL (ref 4.22–5.81)
RDW: 16.6 % — ABNORMAL HIGH (ref 11.5–15.5)
WBC: 10.5 10*3/uL (ref 4.0–10.5)

## 2016-02-12 LAB — ECHOCARDIOGRAM COMPLETE
HEIGHTINCHES: 73 in
WEIGHTICAEL: 3213.42 [oz_av]

## 2016-02-12 LAB — HEPARIN LEVEL (UNFRACTIONATED): Heparin Unfractionated: 0.57 IU/mL (ref 0.30–0.70)

## 2016-02-12 MED ORDER — PERFLUTREN LIPID MICROSPHERE
1.0000 mL | INTRAVENOUS | Status: AC | PRN
  Administered 2016-02-12: 2 mL via INTRAVENOUS
  Filled 2016-02-12: qty 10

## 2016-02-12 MED ORDER — LORAZEPAM 2 MG/ML IJ SOLN
0.5000 mg | INTRAMUSCULAR | Status: DC | PRN
  Administered 2016-02-12 – 2016-02-14 (×3): 0.5 mg via INTRAVENOUS
  Filled 2016-02-12 (×2): qty 1

## 2016-02-12 MED ORDER — LISINOPRIL 10 MG PO TABS
10.0000 mg | ORAL_TABLET | Freq: Every day | ORAL | Status: DC
  Administered 2016-02-12 – 2016-02-15 (×4): 10 mg via ORAL
  Filled 2016-02-12 (×4): qty 1

## 2016-02-12 MED ORDER — AMIODARONE HCL 200 MG PO TABS
400.0000 mg | ORAL_TABLET | Freq: Two times a day (BID) | ORAL | Status: DC
Start: 2016-02-12 — End: 2016-02-15
  Administered 2016-02-12 – 2016-02-15 (×7): 400 mg via ORAL
  Filled 2016-02-12 (×7): qty 2

## 2016-02-12 MED ORDER — AMIODARONE HCL IN DEXTROSE 360-4.14 MG/200ML-% IV SOLN: 30.0000 mg/h | INTRAVENOUS | Status: DC

## 2016-02-12 MED ORDER — METOPROLOL TARTRATE 25 MG PO TABS
25.0000 mg | ORAL_TABLET | Freq: Two times a day (BID) | ORAL | Status: DC
  Administered 2016-02-12 – 2016-02-15 (×5): 25 mg via ORAL
  Filled 2016-02-12 (×5): qty 1

## 2016-02-12 NOTE — Progress Notes (Signed)
Patient arrived from [address] [place], CCMD called and verified on monitor, vital signs obtained will monitor patient. Shandee Jergens, Randall AnKristin Jessup RN

## 2016-02-12 NOTE — Progress Notes (Signed)
Cardiac Rehab RN in to see patient. Wife, daughter and primary RN at bedside. Patient restless and disoriented to time, place, and reason for hospitalization. Will follow directions but only for a short time until he forgets. MI booklet given to wife and she was encouraged to review. Explained that cardiac rehab will see patient post staged PCI on Monday. It is hopeful that patients mental status will clear to baseline once he is moved to tele. Will follow up on Monday.

## 2016-02-12 NOTE — Progress Notes (Signed)
ANTICOAGULATION CONSULT NOTE  Pharmacy Consult for Heparin Indication: chest pain/ACS  No Known Allergies  Patient Measurements: Weight: 200 lb 13.4 oz (91.1 kg) Heparin Dosing Weight: 86.2 kg  Vital Signs: Temp: 97.7 F (36.5 C) (11/25 1100) Temp Source: Oral (11/25 1100) BP: 120/75 (11/25 1322) Pulse Rate: 66 (11/25 1300)  Labs:  Recent Labs  02/10/16 1210 02/10/16 1744 02/10/16 2014 02/11/16 0324 02/11/16 1722 02/12/16 0346  HGB 14.5  --   --  12.4*  --  11.8*  HCT 42.4  --   --  37.9*  --  36.8*  PLT 277  --   --  281  --  260  APTT 40*  --   --   --   --   --   LABPROT 13.9  --   --   --   --   --   INR 1.07  --   --   --   --   --   HEPARINUNFRC  --   --   --   --  0.39 0.57  CREATININE 0.97  --  0.46* 0.80  --  0.93  TROPONINI 0.21* 24.68* 22.16* 40.32*  --   --     Estimated Creatinine Clearance: 82.3 mL/min (by C-G formula based on SCr of 0.93 mg/dL).   Medical History: Past Medical History:  Diagnosis Date  . Arthritis   . Asthma   . GERD (gastroesophageal reflux disease)     Assessment: 70 y/o M with a h/o Alzheimer's admitted with CP with STEMI and s/p cath (12/23) with DES to RCA for staged PCI to LAD/LCx on Monday.  -heparin level is at goal   Goal of Therapy:  Heparin level 0.3-0.7 units/ml Monitor platelets by anticoagulation protocol: Yes   Plan:  -Begin heparin at 1200 units/hr -Heparin level daily wth CBC daily  Harland Germanndrew Tasnim Balentine, Pharm D 02/12/2016 1:59 PM

## 2016-02-12 NOTE — Progress Notes (Signed)
  Echocardiogram 2D Echocardiogram with Definity has been performed.  Nolon RodBrown, Tony 02/12/2016, 12:13 PM

## 2016-02-12 NOTE — Progress Notes (Signed)
Subjective:  POD # 1 Inferior STEMI Rx with DES prox Dom RCA. Residual LAD/LCX disease. No further CP/SOB. For staged PCI. Feeling well without major complaint.  Objective:  Temp:  [97.5 F (36.4 C)-98.5 F (36.9 C)] 97.7 F (36.5 C) (11/25 1100) Pulse Rate:  [60-76] 72 (11/25 1100) Resp:  [16-24] 22 (11/25 1100) BP: (97-138)/(56-100) 99/73 (11/25 1100) SpO2:  [90 %-100 %] 97 % (11/25 1100) Weight:  [200 lb 13.4 oz (91.1 kg)] 200 lb 13.4 oz (91.1 kg) (11/25 0518) Weight change:   Intake/Output from previous day: 11/24 0701 - 11/25 0700 In: 1388.4 [P.O.:480; I.V.:908.4] Out: 1725 [Urine:1725]  Intake/Output from this shift: Total I/O In: 673.5 [P.O.:480; I.V.:193.5] Out: 500 [Urine:500]  Physical Exam: General appearance: alert and no distress Neck: no adenopathy, no carotid bruit, no JVD, supple, symmetrical, trachea midline and thyroid not enlarged, symmetric, no tenderness/mass/nodules Lungs: clear to auscultation bilaterally Heart: regular rate and rhythm, S1, S2 normal, no murmur, click, rub or gallop Extremities: Right groin OK  Lab Results: Results for orders placed or performed during the hospital encounter of 02/10/16 (from the past 48 hour(s))  POCT Activated clotting time     Status: None   Collection Time: 02/10/16  1:36 PM  Result Value Ref Range   Activated Clotting Time 362 seconds  Troponin I (q 6hr x 3)     Status: Abnormal   Collection Time: 02/10/16  5:44 PM  Result Value Ref Range   Troponin I 24.68 (HH) <0.03 ng/mL    Comment: CRITICAL RESULT CALLED TO, READ BACK BY AND VERIFIED WITH: H.JOB,RN 02/10/16 @1916  BY V.WILKINS   POCT Activated clotting time     Status: None   Collection Time: 02/10/16  5:56 PM  Result Value Ref Range   Activated Clotting Time 158 seconds  Troponin I (q 6hr x 3)     Status: Abnormal   Collection Time: 02/10/16  8:14 PM  Result Value Ref Range   Troponin I 22.16 (HH) <0.03 ng/mL    Comment: CRITICAL VALUE  NOTED.  VALUE IS CONSISTENT WITH PREVIOUSLY REPORTED AND CALLED VALUE.  Basic metabolic panel     Status: Abnormal   Collection Time: 02/10/16  8:14 PM  Result Value Ref Range   Sodium 148 (H) 135 - 145 mmol/L   Potassium 2.3 (LL) 3.5 - 5.1 mmol/L    Comment: REPEATED TO VERIFY CRITICAL RESULT CALLED TO, READ BACK BY AND VERIFIED WITH: BROWN M,RN 02/10/16 2117 WAYK    Chloride 117 (H) 101 - 111 mmol/L   CO2 12 (L) 22 - 32 mmol/L   Glucose, Bld 94 65 - 99 mg/dL   BUN 7 6 - 20 mg/dL   Creatinine, Ser 0.46 (L) 0.61 - 1.24 mg/dL   Calcium 5.7 (LL) 8.9 - 10.3 mg/dL    Comment: REPEATED TO VERIFY CRITICAL RESULT CALLED TO, READ BACK BY AND VERIFIED WITH: BROWN M,RN 02/10/16 2117 WAYK    GFR calc non Af Amer >60 >60 mL/min   GFR calc Af Amer >60 >60 mL/min    Comment: (NOTE) The eGFR has been calculated using the CKD EPI equation. This calculation has not been validated in all clinical situations. eGFR's persistently <60 mL/min signify possible Chronic Kidney Disease.    Anion gap 19 (H) 5 - 15  Magnesium     Status: Abnormal   Collection Time: 02/10/16  8:14 PM  Result Value Ref Range   Magnesium 1.2 (L) 1.7 - 2.4 mg/dL  MRSA  PCR Screening     Status: None   Collection Time: 02/10/16  8:47 PM  Result Value Ref Range   MRSA by PCR NEGATIVE NEGATIVE    Comment:        The GeneXpert MRSA Assay (FDA approved for NASAL specimens only), is one component of a comprehensive MRSA colonization surveillance program. It is not intended to diagnose MRSA infection nor to guide or monitor treatment for MRSA infections.   POCT Activated clotting time     Status: None   Collection Time: 02/10/16  8:53 PM  Result Value Ref Range   Activated Clotting Time 131 seconds  Troponin I (q 6hr x 3)     Status: Abnormal   Collection Time: 02/11/16  3:24 AM  Result Value Ref Range   Troponin I 40.32 (HH) <0.03 ng/mL    Comment: CRITICAL VALUE NOTED.  VALUE IS CONSISTENT WITH PREVIOUSLY REPORTED  AND CALLED VALUE.  CBC     Status: Abnormal   Collection Time: 02/11/16  3:24 AM  Result Value Ref Range   WBC 13.9 (H) 4.0 - 10.5 K/uL   RBC 4.48 4.22 - 5.81 MIL/uL   Hemoglobin 12.4 (L) 13.0 - 17.0 g/dL   HCT 37.9 (L) 39.0 - 52.0 %   MCV 84.6 78.0 - 100.0 fL   MCH 27.7 26.0 - 34.0 pg   MCHC 32.7 30.0 - 36.0 g/dL   RDW 16.1 (H) 11.5 - 15.5 %   Platelets 281 150 - 400 K/uL  Basic metabolic panel     Status: Abnormal   Collection Time: 02/11/16  3:24 AM  Result Value Ref Range   Sodium 134 (L) 135 - 145 mmol/L    Comment: DELTA CHECK NOTED   Potassium 4.5 3.5 - 5.1 mmol/L    Comment: DELTA CHECK NOTED   Chloride 103 101 - 111 mmol/L   CO2 23 22 - 32 mmol/L   Glucose, Bld 128 (H) 65 - 99 mg/dL   BUN 8 6 - 20 mg/dL   Creatinine, Ser 0.80 0.61 - 1.24 mg/dL   Calcium 9.0 8.9 - 10.3 mg/dL    Comment: DELTA CHECK NOTED   GFR calc non Af Amer >60 >60 mL/min   GFR calc Af Amer >60 >60 mL/min    Comment: (NOTE) The eGFR has been calculated using the CKD EPI equation. This calculation has not been validated in all clinical situations. eGFR's persistently <60 mL/min signify possible Chronic Kidney Disease.    Anion gap 8 5 - 15  Magnesium     Status: Abnormal   Collection Time: 02/11/16  3:24 AM  Result Value Ref Range   Magnesium 2.8 (H) 1.7 - 2.4 mg/dL  Heparin level (unfractionated)     Status: None   Collection Time: 02/11/16  5:22 PM  Result Value Ref Range   Heparin Unfractionated 0.39 0.30 - 0.70 IU/mL    Comment:        IF HEPARIN RESULTS ARE BELOW EXPECTED VALUES, AND PATIENT DOSAGE HAS BEEN CONFIRMED, SUGGEST FOLLOW UP TESTING OF ANTITHROMBIN III LEVELS.   Basic metabolic panel     Status: Abnormal   Collection Time: 02/12/16  3:46 AM  Result Value Ref Range   Sodium 133 (L) 135 - 145 mmol/L   Potassium 3.7 3.5 - 5.1 mmol/L    Comment: DELTA CHECK NOTED   Chloride 102 101 - 111 mmol/L   CO2 23 22 - 32 mmol/L   Glucose, Bld 103 (H) 65 - 99 mg/dL  BUN 11 6 -  20 mg/dL   Creatinine, Ser 0.93 0.61 - 1.24 mg/dL   Calcium 8.8 (L) 8.9 - 10.3 mg/dL   GFR calc non Af Amer >60 >60 mL/min   GFR calc Af Amer >60 >60 mL/min    Comment: (NOTE) The eGFR has been calculated using the CKD EPI equation. This calculation has not been validated in all clinical situations. eGFR's persistently <60 mL/min signify possible Chronic Kidney Disease.    Anion gap 8 5 - 15  Heparin level (unfractionated)     Status: None   Collection Time: 02/12/16  3:46 AM  Result Value Ref Range   Heparin Unfractionated 0.57 0.30 - 0.70 IU/mL    Comment:        IF HEPARIN RESULTS ARE BELOW EXPECTED VALUES, AND PATIENT DOSAGE HAS BEEN CONFIRMED, SUGGEST FOLLOW UP TESTING OF ANTITHROMBIN III LEVELS.   CBC     Status: Abnormal   Collection Time: 02/12/16  3:46 AM  Result Value Ref Range   WBC 10.5 4.0 - 10.5 K/uL   RBC 4.33 4.22 - 5.81 MIL/uL   Hemoglobin 11.8 (L) 13.0 - 17.0 g/dL   HCT 36.8 (L) 39.0 - 52.0 %   MCV 85.0 78.0 - 100.0 fL   MCH 27.3 26.0 - 34.0 pg   MCHC 32.1 30.0 - 36.0 g/dL   RDW 16.6 (H) 11.5 - 15.5 %   Platelets 260 150 - 400 K/uL    Imaging: Imaging results have been reviewed  Tele- NSR with occasional PVCs   Assessment/Plan:   Active Problems:   STEMI (ST elevation myocardial infarction) ([place])   STEMI involving oth coronary artery of inferior wall ([place]) 1. NSVT\ 2.   Time Spent Directly with Patient:  30 minutes  Length of Stay:  LOS: 2 days   POD # 2 Inf STEMI Rx with DES prox Dom RCA with residual LAD/LCX Dx. Trop rose to 40. Electrolytes repleted and Nl this AM (K). Runs of NSVT and PVCs significantly reduced.  Remains on Amio IV. Kaithlyn Teagle switch to PO amiodarone after current bag is done. Pt denies CP/SOB.  Lawsyn Heiler start low dose BB and ACE-I BP permitting. Transfer to telemetry today. Tentative plan for staged LAD/LCX intervention on Monday. Annette Liotta restart IV hep. Has been confused since being in the ICU.  Possible transfer out Bhakti Labella improve  mental status.  Pecola Haxton Meredith Leeds 02/12/2016, 12:55 PM

## 2016-02-13 DIAGNOSIS — I2119 ST elevation (STEMI) myocardial infarction involving other coronary artery of inferior wall: Secondary | ICD-10-CM | POA: Diagnosis not present

## 2016-02-13 DIAGNOSIS — I2111 ST elevation (STEMI) myocardial infarction involving right coronary artery: Secondary | ICD-10-CM | POA: Diagnosis not present

## 2016-02-13 LAB — CBC
HCT: 34.5 % — ABNORMAL LOW (ref 39.0–52.0)
HEMOGLOBIN: 11.3 g/dL — AB (ref 13.0–17.0)
MCH: 27.6 pg (ref 26.0–34.0)
MCHC: 32.8 g/dL (ref 30.0–36.0)
MCV: 84.4 fL (ref 78.0–100.0)
PLATELETS: 269 10*3/uL (ref 150–400)
RBC: 4.09 MIL/uL — ABNORMAL LOW (ref 4.22–5.81)
RDW: 16.1 % — AB (ref 11.5–15.5)
WBC: 10.4 10*3/uL (ref 4.0–10.5)

## 2016-02-13 LAB — BASIC METABOLIC PANEL
Anion gap: 9 (ref 5–15)
BUN: 12 mg/dL (ref 6–20)
CALCIUM: 8.8 mg/dL — AB (ref 8.9–10.3)
CO2: 23 mmol/L (ref 22–32)
Chloride: 102 mmol/L (ref 101–111)
Creatinine, Ser: 0.99 mg/dL (ref 0.61–1.24)
GFR calc Af Amer: >60 mL/min (ref >60)
Glucose, Bld: 106 mg/dL — ABNORMAL HIGH (ref 65–99)
POTASSIUM: 3.9 mmol/L (ref 3.5–5.1)
SODIUM: 134 mmol/L — AB (ref 135–145)

## 2016-02-13 LAB — HEPARIN LEVEL (UNFRACTIONATED): HEPARIN UNFRACTIONATED: 0.52 [IU]/mL (ref 0.30–0.70)

## 2016-02-13 NOTE — Progress Notes (Signed)
   Subjective:  POD # 1 Inferior STEMI Rx with DES prox Dom RCA. Residual LAD/LCX disease. No further CP/SOB. No questions today about staged PCI.  Objective:  Temp:  [97.7 F (36.5 C)-98.3 F (36.8 C)] 98 F (36.7 C) (11/26 0500) Pulse Rate:  [65-76] 68 (11/26 0500) Resp:  [14-24] 18 (11/26 0500) BP: (97-159)/(56-119) 122/71 (11/26 0500) SpO2:  [92 %-100 %] 97 % (11/26 0500) Weight change:   Intake/Output from previous day: 11/25 0701 - 11/26 0700 In: 1665 [P.O.:1320; I.V.:345] Out: 1325 [Urine:1325]  Intake/Output from this shift: No intake/output data recorded.  Physical Exam: General appearance: alert and no distress Neck: no adenopathy, no carotid bruit, no JVD, supple, symmetrical, trachea midline and thyroid not enlarged, symmetric, no tenderness/mass/nodules Lungs: clear to auscultation bilaterally Heart: regular rate and rhythm, S1, S2 normal, no murmur, click, rub or gallop Extremities: Right groin OK  Lab Results: Results for orders placed or performed during the hospital encounter of 02/10/16 (from the past 48 hour(s))  Heparin level (unfractionated)     Status: None   Collection Time: 02/11/16  5:22 PM  Result Value Ref Range   Heparin Unfractionated 0.39 0.30 - 0.70 IU/mL    Comment:        IF HEPARIN RESULTS ARE BELOW EXPECTED VALUES, AND PATIENT DOSAGE HAS BEEN CONFIRMED, SUGGEST FOLLOW UP TESTING OF ANTITHROMBIN III LEVELS.   Basic metabolic panel     Status: Abnormal   Collection Time: 02/12/16  3:46 AM  Result Value Ref Range   Sodium 133 (L) 135 - 145 mmol/L   Potassium 3.7 3.5 - 5.1 mmol/L    Comment: DELTA CHECK NOTED   Chloride 102 101 - 111 mmol/L   CO2 23 22 - 32 mmol/L   Glucose, Bld 103 (H) 65 - 99 mg/dL   BUN 11 6 - 20 mg/dL   Creatinine, Ser 0.93 0.61 - 1.24 mg/dL   Calcium 8.8 (L) 8.9 - 10.3 mg/dL   GFR calc non Af Amer >60 >60 mL/min   GFR calc Af Amer >60 >60 mL/min    Comment: (NOTE) The eGFR has been calculated using the  CKD EPI equation. This calculation has not been validated in all clinical situations. eGFR's persistently <60 mL/min signify possible Chronic Kidney Disease.    Anion gap 8 5 - 15  Heparin level (unfractionated)     Status: None   Collection Time: 02/12/16  3:46 AM  Result Value Ref Range   Heparin Unfractionated 0.57 0.30 - 0.70 IU/mL    Comment:        IF HEPARIN RESULTS ARE BELOW EXPECTED VALUES, AND PATIENT DOSAGE HAS BEEN CONFIRMED, SUGGEST FOLLOW UP TESTING OF ANTITHROMBIN III LEVELS.   CBC     Status: Abnormal   Collection Time: 02/12/16  3:46 AM  Result Value Ref Range   WBC 10.5 4.0 - 10.5 K/uL   RBC 4.33 4.22 - 5.81 MIL/uL   Hemoglobin 11.8 (L) 13.0 - 17.0 g/dL   HCT 36.8 (L) 39.0 - 52.0 %   MCV 85.0 78.0 - 100.0 fL   MCH 27.3 26.0 - 34.0 pg   MCHC 32.1 30.0 - 36.0 g/dL   RDW 16.6 (H) 11.5 - 15.5 %   Platelets 260 150 - 400 K/uL  Basic metabolic panel     Status: Abnormal   Collection Time: 02/13/16  3:31 AM  Result Value Ref Range   Sodium 134 (L) 135 - 145 mmol/L   Potassium 3.9 3.5 - 5.1   mmol/L   Chloride 102 101 - 111 mmol/L   CO2 23 22 - 32 mmol/L   Glucose, Bld 106 (H) 65 - 99 mg/dL   BUN 12 6 - 20 mg/dL   Creatinine, Ser 0.99 0.61 - 1.24 mg/dL   Calcium 8.8 (L) 8.9 - 10.3 mg/dL   GFR calc non Af Amer >60 >60 mL/min   GFR calc Af Amer >60 >60 mL/min    Comment: (NOTE) The eGFR has been calculated using the CKD EPI equation. This calculation has not been validated in all clinical situations. eGFR's persistently <60 mL/min signify possible Chronic Kidney Disease.    Anion gap 9 5 - 15  Heparin level (unfractionated)     Status: None   Collection Time: 02/13/16  3:31 AM  Result Value Ref Range   Heparin Unfractionated 0.52 0.30 - 0.70 IU/mL    Comment:        IF HEPARIN RESULTS ARE BELOW EXPECTED VALUES, AND PATIENT DOSAGE HAS BEEN CONFIRMED, SUGGEST FOLLOW UP TESTING OF ANTITHROMBIN III LEVELS.   CBC     Status: Abnormal   Collection Time:  02/13/16  3:31 AM  Result Value Ref Range   WBC 10.4 4.0 - 10.5 K/uL   RBC 4.09 (L) 4.22 - 5.81 MIL/uL   Hemoglobin 11.3 (L) 13.0 - 17.0 g/dL   HCT 34.5 (L) 39.0 - 52.0 %   MCV 84.4 78.0 - 100.0 fL   MCH 27.6 26.0 - 34.0 pg   MCHC 32.8 30.0 - 36.0 g/dL   RDW 16.1 (H) 11.5 - 15.5 %   Platelets 269 150 - 400 K/uL    Imaging: Imaging results have been reviewed  Tele- NSR with occasional PVCs   Assessment/Plan:   Active Problems:   STEMI (ST elevation myocardial infarction) ([facility])   STEMI involving oth coronary artery of inferior wall ([facility]) 1. NSVT   Length of Stay:  LOS: 3 days   POD # 2 Inf STEMI Rx with DES prox Dom RCA with residual LAD/LCX Dx. Trop rose to 40. Electrolytes repleted and Nl this AM (K). Runs of NSVT and PVCs significantly reduced.   Had one run of NSVT of 6 beats overnight.  Continue PO amiodarone. Ellora Varnum start low dose BB and ACE-I BP permitting.Tentative plan for staged LAD/LCX intervention on Monday. Destany Severns restart IV hep. Mental status improving after being out of the ICU.  Shanora Christensen Martin Raeshawn Tafolla 02/13/2016, 7:49 AM 

## 2016-02-13 NOTE — Progress Notes (Signed)
ANTICOAGULATION CONSULT NOTE  Pharmacy Consult for Heparin Indication: chest pain/ACS  No Known Allergies  Patient Measurements: Weight: 200 lb 13.4 oz (91.1 kg) Heparin Dosing Weight: 86.2 kg  Vital Signs: Temp: 98 F (36.7 C) (11/26 0500) Temp Source: Oral (11/26 0500) BP: 122/71 (11/26 0500) Pulse Rate: 68 (11/26 0500)  Labs:  Recent Labs  02/10/16 1210 02/10/16 1744 02/10/16 2014 02/11/16 0324 02/11/16 1722 02/12/16 0346 02/13/16 0331  HGB 14.5  --   --  12.4*  --  11.8* 11.3*  HCT 42.4  --   --  37.9*  --  36.8* 34.5*  PLT 277  --   --  281  --  260 269  APTT 40*  --   --   --   --   --   --   LABPROT 13.9  --   --   --   --   --   --   INR 1.07  --   --   --   --   --   --   HEPARINUNFRC  --   --   --   --  0.39 0.57 0.52  CREATININE 0.97  --  0.46* 0.80  --  0.93 0.99  TROPONINI 0.21* 24.68* 22.16* 40.32*  --   --   --     Estimated Creatinine Clearance: 77.3 mL/min (by C-G formula based on SCr of 0.99 mg/dL).   Medical History: Past Medical History:  Diagnosis Date  . Arthritis   . Asthma   . GERD (gastroesophageal reflux disease)     Assessment: 70 y/o M with a h/o Alzheimer's admitted with CP with STEMI and s/p cath (12/23) with DES to RCA for staged PCI to LAD/LCx on Monday.  -heparin level is at goal   Goal of Therapy:  Heparin level 0.3-0.7 units/ml Monitor platelets by anticoagulation protocol: Yes   Plan:  -Continue heparin at 1200 units/hr -Heparin level daily wth CBC daily  Harland GermanAndrew Kooper Chriswell, Pharm D 02/13/2016 11:03 AM

## 2016-02-14 ENCOUNTER — Encounter (HOSPITAL_COMMUNITY): Payer: Self-pay | Admitting: General Practice

## 2016-02-14 ENCOUNTER — Encounter (HOSPITAL_COMMUNITY)
Admission: EM | Disposition: A | Discharge status: Home Health | Payer: Self-pay | Source: Other Acute Inpatient Hospital | Attending: Cardiovascular Disease

## 2016-02-14 DIAGNOSIS — F0280 Dementia in other diseases classified elsewhere without behavioral disturbance: Secondary | ICD-10-CM | POA: Diagnosis not present

## 2016-02-14 DIAGNOSIS — M1990 Unspecified osteoarthritis, unspecified site: Secondary | ICD-10-CM | POA: Diagnosis not present

## 2016-02-14 DIAGNOSIS — Z8042 Family history of malignant neoplasm of prostate: Secondary | ICD-10-CM | POA: Diagnosis not present

## 2016-02-14 DIAGNOSIS — I493 Ventricular premature depolarization: Secondary | ICD-10-CM | POA: Diagnosis not present

## 2016-02-14 DIAGNOSIS — I472 Ventricular tachycardia: Secondary | ICD-10-CM | POA: Diagnosis not present

## 2016-02-14 DIAGNOSIS — Z7982 Long term (current) use of aspirin: Secondary | ICD-10-CM | POA: Diagnosis not present

## 2016-02-14 DIAGNOSIS — K219 Gastro-esophageal reflux disease without esophagitis: Secondary | ICD-10-CM | POA: Diagnosis not present

## 2016-02-14 DIAGNOSIS — I255 Ischemic cardiomyopathy: Secondary | ICD-10-CM | POA: Diagnosis not present

## 2016-02-14 DIAGNOSIS — Z87891 Personal history of nicotine dependence: Secondary | ICD-10-CM | POA: Diagnosis not present

## 2016-02-14 DIAGNOSIS — I2510 Atherosclerotic heart disease of native coronary artery without angina pectoris: Secondary | ICD-10-CM | POA: Diagnosis not present

## 2016-02-14 DIAGNOSIS — I2119 ST elevation (STEMI) myocardial infarction involving other coronary artery of inferior wall: Secondary | ICD-10-CM | POA: Diagnosis not present

## 2016-02-14 DIAGNOSIS — G309 Alzheimer's disease, unspecified: Secondary | ICD-10-CM | POA: Diagnosis not present

## 2016-02-14 HISTORY — PX: CARDIAC CATHETERIZATION: SHX172

## 2016-02-14 HISTORY — PX: CORONARY ANGIOPLASTY WITH STENT PLACEMENT: SHX49

## 2016-02-14 LAB — CBC
HEMATOCRIT: 34.3 % — AB (ref 39.0–52.0)
HEMOGLOBIN: 11.2 g/dL — AB (ref 13.0–17.0)
MCH: 27.4 pg (ref 26.0–34.0)
MCHC: 32.7 g/dL (ref 30.0–36.0)
MCV: 83.9 fL (ref 78.0–100.0)
Platelets: 271 10*3/uL (ref 150–400)
RBC: 4.09 MIL/uL — AB (ref 4.22–5.81)
RDW: 15.8 % — ABNORMAL HIGH (ref 11.5–15.5)
WBC: 11.7 10*3/uL — ABNORMAL HIGH (ref 4.0–10.5)

## 2016-02-14 LAB — POCT ACTIVATED CLOTTING TIME: Activated Clotting Time: 368 seconds

## 2016-02-14 LAB — BASIC METABOLIC PANEL
ANION GAP: 9 (ref 5–15)
BUN: 12 mg/dL (ref 6–20)
CO2: 23 mmol/L (ref 22–32)
Calcium: 8.7 mg/dL — ABNORMAL LOW (ref 8.9–10.3)
Chloride: 102 mmol/L (ref 101–111)
Creatinine, Ser: 0.96 mg/dL (ref 0.61–1.24)
GFR calc Af Amer: >60 mL/min (ref >60)
GFR calc non Af Amer: >60 mL/min (ref >60)
GLUCOSE: 106 mg/dL — AB (ref 65–99)
POTASSIUM: 3.6 mmol/L (ref 3.5–5.1)
Sodium: 134 mmol/L — ABNORMAL LOW (ref 135–145)

## 2016-02-14 LAB — HEPARIN LEVEL (UNFRACTIONATED): Heparin Unfractionated: 0.4 IU/mL (ref 0.30–0.70)

## 2016-02-14 SURGERY — CORONARY STENT INTERVENTION
Anesthesia: LOCAL

## 2016-02-14 MED ORDER — LABETALOL HCL 5 MG/ML IV SOLN: 10.0000 mg | INTRAVENOUS | Status: AC | PRN

## 2016-02-14 MED ORDER — SODIUM CHLORIDE 0.9 % WEIGHT BASED INFUSION
1.0000 mL/kg/h | INTRAVENOUS | Status: DC
  Administered 2016-02-14: 1 mL/kg/h via INTRAVENOUS

## 2016-02-14 MED ORDER — IOPAMIDOL (ISOVUE-370) INJECTION 76%
INTRAVENOUS | Status: AC
  Filled 2016-02-14: qty 50

## 2016-02-14 MED ORDER — SODIUM CHLORIDE 0.9 % WEIGHT BASED INFUSION: 1.0000 mL/kg/h | INTRAVENOUS | Status: AC

## 2016-02-14 MED ORDER — SODIUM CHLORIDE 0.9 % IV SOLN
250.0000 mL | INTRAVENOUS | Status: DC | PRN
Start: 2016-02-14 — End: 2016-02-15

## 2016-02-14 MED ORDER — SODIUM CHLORIDE 0.9 % IV SOLN: 250.0000 mL | INTRAVENOUS | Status: DC | PRN

## 2016-02-14 MED ORDER — ASPIRIN 81 MG PO CHEW
81.0000 mg | CHEWABLE_TABLET | ORAL | Status: AC
  Administered 2016-02-14: 81 mg via ORAL
  Filled 2016-02-14: qty 1

## 2016-02-14 MED ORDER — BIVALIRUDIN BOLUS VIA INFUSION - CUPID
INTRAVENOUS | Status: DC | PRN
  Administered 2016-02-14: 64.5 mg via INTRAVENOUS

## 2016-02-14 MED ORDER — LORAZEPAM 2 MG/ML IJ SOLN: 0.5000 mg | INTRAMUSCULAR | Status: DC | PRN

## 2016-02-14 MED ORDER — ANGIOPLASTY BOOK
Freq: Once | Status: AC
  Administered 2016-02-14
  Filled 2016-02-14: qty 1

## 2016-02-14 MED ORDER — BIVALIRUDIN 250 MG IV SOLR
INTRAVENOUS | Status: AC
  Filled 2016-02-14: qty 250

## 2016-02-14 MED ORDER — HEART ATTACK BOUNCING BOOK
Freq: Once | Status: AC
  Administered 2016-02-14
  Filled 2016-02-14: qty 1

## 2016-02-14 MED ORDER — SODIUM CHLORIDE 0.9% FLUSH: 3.0000 mL | Freq: Two times a day (BID) | INTRAVENOUS | Status: DC

## 2016-02-14 MED ORDER — SODIUM CHLORIDE 0.9 % WEIGHT BASED INFUSION
3.0000 mL/kg/h | INTRAVENOUS | Status: DC
  Administered 2016-02-14: 3 mL/kg/h via INTRAVENOUS

## 2016-02-14 MED ORDER — NITROGLYCERIN 1 MG/10 ML FOR IR/CATH LAB
INTRA_ARTERIAL | Status: AC
  Filled 2016-02-14: qty 10

## 2016-02-14 MED ORDER — IOPAMIDOL (ISOVUE-370) INJECTION 76%
INTRAVENOUS | Status: AC
  Filled 2016-02-14: qty 125

## 2016-02-14 MED ORDER — IOPAMIDOL (ISOVUE-370) INJECTION 76%
INTRAVENOUS | Status: DC | PRN
  Administered 2016-02-14: 115 mL via INTRA_ARTERIAL

## 2016-02-14 MED ORDER — ASPIRIN 81 MG PO CHEW: 81.0000 mg | CHEWABLE_TABLET | Freq: Every day | ORAL | Status: DC

## 2016-02-14 MED ORDER — HEPARIN (PORCINE) IN NACL 2-0.9 UNIT/ML-% IJ SOLN
INTRAMUSCULAR | Status: DC | PRN
  Administered 2016-02-14: 1500 mL

## 2016-02-14 MED ORDER — SODIUM CHLORIDE 0.9% FLUSH: 3.0000 mL | INTRAVENOUS | Status: DC | PRN

## 2016-02-14 MED ORDER — HYDRALAZINE HCL 20 MG/ML IJ SOLN: 5.0000 mg | INTRAMUSCULAR | Status: AC | PRN

## 2016-02-14 MED ORDER — LIDOCAINE HCL (PF) 1 % IJ SOLN
INTRAMUSCULAR | Status: AC
  Filled 2016-02-14: qty 30

## 2016-02-14 MED ORDER — ACETAMINOPHEN 325 MG PO TABS: 650.0000 mg | ORAL_TABLET | ORAL | Status: DC | PRN

## 2016-02-14 MED ORDER — LIDOCAINE HCL (PF) 1 % IJ SOLN
INTRAMUSCULAR | Status: DC | PRN
Start: 2016-02-14 — End: 2016-02-14
  Administered 2016-02-14: 30 mL

## 2016-02-14 MED ORDER — SODIUM CHLORIDE 0.9% FLUSH
3.0000 mL | Freq: Two times a day (BID) | INTRAVENOUS | Status: DC
  Administered 2016-02-14: 3 mL via INTRAVENOUS

## 2016-02-14 MED ORDER — HEPARIN (PORCINE) IN NACL 2-0.9 UNIT/ML-% IJ SOLN
INTRAMUSCULAR | Status: AC
  Filled 2016-02-14: qty 1000

## 2016-02-14 MED ORDER — TICAGRELOR 90 MG PO TABS
90.0000 mg | ORAL_TABLET | Freq: Two times a day (BID) | ORAL | Status: DC
  Administered 2016-02-14 – 2016-02-15 (×2): 90 mg via ORAL
  Filled 2016-02-14 (×2): qty 1

## 2016-02-14 MED ORDER — ONDANSETRON HCL 4 MG/2ML IJ SOLN: 4.0000 mg | Freq: Four times a day (QID) | INTRAMUSCULAR | Status: DC | PRN

## 2016-02-14 MED ORDER — SODIUM CHLORIDE 0.9 % IV SOLN
INTRAVENOUS | Status: DC | PRN
  Administered 2016-02-14: 1.75 mg/kg/h via INTRAVENOUS

## 2016-02-14 SURGICAL SUPPLY — 15 items
BALLN EUPHORA RX 2.0X12 (BALLOONS) ×2
BALLN ~~LOC~~ TREK RX 3.25X12 (BALLOONS) ×2
BALLOON EUPHORA RX 2.0X12 (BALLOONS) ×1 IMPLANT
BALLOON ~~LOC~~ TREK RX 3.25X12 (BALLOONS) ×1 IMPLANT
CATH INFINITI JR4 5F (CATHETERS) ×2 IMPLANT
CATH VISTA GUIDE 6FR XBLAD3.5 (CATHETERS) ×2 IMPLANT
KIT ENCORE 26 ADVANTAGE (KITS) ×2 IMPLANT
KIT HEART LEFT (KITS) ×2 IMPLANT
PACK CARDIAC CATHETERIZATION (CUSTOM PROCEDURE TRAY) ×2 IMPLANT
SHEATH PINNACLE 6F 10CM (SHEATH) ×2 IMPLANT
STENT SYNERGY DES 3X16 (Permanent Stent) ×4 IMPLANT
TRANSDUCER W/STOPCOCK (MISCELLANEOUS) ×2 IMPLANT
TUBING CIL FLEX 10 FLL-RA (TUBING) ×2 IMPLANT
WIRE ASAHI PROWATER 180CM (WIRE) ×2 IMPLANT
WIRE EMERALD 3MM-J .035X150CM (WIRE) ×2 IMPLANT

## 2016-02-14 NOTE — H&P (View-Only) (Signed)
Subjective:  POD # 1 Inferior STEMI Rx with DES prox Dom RCA. Residual LAD/LCX disease. No further CP/SOB. No questions today about staged PCI.  Objective:  Temp:  [97.7 F (36.5 C)-98.3 F (36.8 C)] 98 F (36.7 C) (11/26 0500) Pulse Rate:  [65-76] 68 (11/26 0500) Resp:  [14-24] 18 (11/26 0500) BP: (97-159)/(56-119) 122/71 (11/26 0500) SpO2:  [92 %-100 %] 97 % (11/26 0500) Weight change:   Intake/Output from previous day: 11/25 0701 - 11/26 0700 In: 1665 [P.O.:1320; I.V.:345] Out: 1325 [Urine:1325]  Intake/Output from this shift: No intake/output data recorded.  Physical Exam: General appearance: alert and no distress Neck: no adenopathy, no carotid bruit, no JVD, supple, symmetrical, trachea midline and thyroid not enlarged, symmetric, no tenderness/mass/nodules Lungs: clear to auscultation bilaterally Heart: regular rate and rhythm, S1, S2 normal, no murmur, click, rub or gallop Extremities: Right groin OK  Lab Results: Results for orders placed or performed during the hospital encounter of 02/10/16 (from the past 48 hour(s))  Heparin level (unfractionated)     Status: None   Collection Time: 02/11/16  5:22 PM  Result Value Ref Range   Heparin Unfractionated 0.39 0.30 - 0.70 IU/mL    Comment:        IF HEPARIN RESULTS ARE BELOW EXPECTED VALUES, AND PATIENT DOSAGE HAS BEEN CONFIRMED, SUGGEST FOLLOW UP TESTING OF ANTITHROMBIN III LEVELS.   Basic metabolic panel     Status: Abnormal   Collection Time: 02/12/16  3:46 AM  Result Value Ref Range   Sodium 133 (L) 135 - 145 mmol/L   Potassium 3.7 3.5 - 5.1 mmol/L    Comment: DELTA CHECK NOTED   Chloride 102 101 - 111 mmol/L   CO2 23 22 - 32 mmol/L   Glucose, Bld 103 (H) 65 - 99 mg/dL   BUN 11 6 - 20 mg/dL   Creatinine, Ser 0.93 0.61 - 1.24 mg/dL   Calcium 8.8 (L) 8.9 - 10.3 mg/dL   GFR calc non Af Amer >60 >60 mL/min   GFR calc Af Amer >60 >60 mL/min    Comment: (NOTE) The eGFR has been calculated using the  CKD EPI equation. This calculation has not been validated in all clinical situations. eGFR's persistently <60 mL/min signify possible Chronic Kidney Disease.    Anion gap 8 5 - 15  Heparin level (unfractionated)     Status: None   Collection Time: 02/12/16  3:46 AM  Result Value Ref Range   Heparin Unfractionated 0.57 0.30 - 0.70 IU/mL    Comment:        IF HEPARIN RESULTS ARE BELOW EXPECTED VALUES, AND PATIENT DOSAGE HAS BEEN CONFIRMED, SUGGEST FOLLOW UP TESTING OF ANTITHROMBIN III LEVELS.   CBC     Status: Abnormal   Collection Time: 02/12/16  3:46 AM  Result Value Ref Range   WBC 10.5 4.0 - 10.5 K/uL   RBC 4.33 4.22 - 5.81 MIL/uL   Hemoglobin 11.8 (L) 13.0 - 17.0 g/dL   HCT 36.8 (L) 39.0 - 52.0 %   MCV 85.0 78.0 - 100.0 fL   MCH 27.3 26.0 - 34.0 pg   MCHC 32.1 30.0 - 36.0 g/dL   RDW 16.6 (H) 11.5 - 15.5 %   Platelets 260 150 - 400 K/uL  Basic metabolic panel     Status: Abnormal   Collection Time: 02/13/16  3:31 AM  Result Value Ref Range   Sodium 134 (L) 135 - 145 mmol/L   Potassium 3.9 3.5 - 5.1  mmol/L   Chloride 102 101 - 111 mmol/L   CO2 23 22 - 32 mmol/L   Glucose, Bld 106 (H) 65 - 99 mg/dL   BUN 12 6 - 20 mg/dL   Creatinine, Ser 0.99 0.61 - 1.24 mg/dL   Calcium 8.8 (L) 8.9 - 10.3 mg/dL   GFR calc non Af Amer >60 >60 mL/min   GFR calc Af Amer >60 >60 mL/min    Comment: (NOTE) The eGFR has been calculated using the CKD EPI equation. This calculation has not been validated in all clinical situations. eGFR's persistently <60 mL/min signify possible Chronic Kidney Disease.    Anion gap 9 5 - 15  Heparin level (unfractionated)     Status: None   Collection Time: 02/13/16  3:31 AM  Result Value Ref Range   Heparin Unfractionated 0.52 0.30 - 0.70 IU/mL    Comment:        IF HEPARIN RESULTS ARE BELOW EXPECTED VALUES, AND PATIENT DOSAGE HAS BEEN CONFIRMED, SUGGEST FOLLOW UP TESTING OF ANTITHROMBIN III LEVELS.   CBC     Status: Abnormal   Collection Time:  02/13/16  3:31 AM  Result Value Ref Range   WBC 10.4 4.0 - 10.5 K/uL   RBC 4.09 (L) 4.22 - 5.81 MIL/uL   Hemoglobin 11.3 (L) 13.0 - 17.0 g/dL   HCT 34.5 (L) 39.0 - 52.0 %   MCV 84.4 78.0 - 100.0 fL   MCH 27.6 26.0 - 34.0 pg   MCHC 32.8 30.0 - 36.0 g/dL   RDW 16.1 (H) 11.5 - 15.5 %   Platelets 269 150 - 400 K/uL    Imaging: Imaging results have been reviewed  Tele- NSR with occasional PVCs   Assessment/Plan:   Active Problems:   STEMI (ST elevation myocardial infarction) ([place])   STEMI involving oth coronary artery of inferior wall ([place]) 1. NSVT   Length of Stay:  LOS: 3 days   POD # 2 Inf STEMI Rx with DES prox Dom RCA with residual LAD/LCX Dx. Trop rose to 40. Electrolytes repleted and Nl this AM (K). Runs of NSVT and PVCs significantly reduced.   Had one run of NSVT of 6 beats overnight.  Continue PO amiodarone. Will start low dose BB and ACE-I BP permitting.Tentative plan for staged LAD/LCX intervention on Monday. Will restart IV hep. Mental status improving after being out of the ICU.  Will Meredith Leeds 02/13/2016, 7:49 AM

## 2016-02-14 NOTE — Interval H&P Note (Signed)
Cath Lab Visit (complete for each Cath Lab visit)  Clinical Evaluation Leading to the Procedure:   ACS: No.  Non-ACS:    Anginal Classification: No Symptoms  Anti-ischemic medical therapy: Minimal Therapy (1 class of medications)  Non-Invasive Test Results: No non-invasive testing performed  Prior CABG: No previous CABG      History and Physical Interval Note:  02/14/2016 4:18 PM  Jeff Key  has presented today for surgery, with the diagnosis of STEMI, CAD  The various methods of treatment have been discussed with the patient and family. After consideration of risks, benefits and other options for treatment, the patient has consented to  Procedure(s): Coronary Stent Intervention-LAD and CFX (N/A) as a surgical intervention .  The patient's history has been reviewed, patient examined, no change in status, stable for surgery.  I have reviewed the patient's chart and labs.  Questions were answered to the patient's satisfaction.     Nanetta BattyBerry, Deangelo Berns

## 2016-02-14 NOTE — Progress Notes (Signed)
ANTICOAGULATION CONSULT NOTE  Pharmacy Consult for Heparin Indication: chest pain/ACS  No Known Allergies  Patient Measurements: Weight: 189 lb 9.5 oz (86 kg) Heparin Dosing Weight: 86.2 kg  Vital Signs: Temp: 98.5 F (36.9 C) (11/27 0412) Temp Source: Oral (11/27 0412) BP: 127/74 (11/27 0412) Pulse Rate: 65 (11/27 0412)  Labs:  Recent Labs  02/12/16 0346 02/13/16 0331 02/14/16 0357  HGB 11.8* 11.3* 11.2*  HCT 36.8* 34.5* 34.3*  PLT 260 269 271  HEPARINUNFRC 0.57 0.52 0.40  CREATININE 0.93 0.99 0.96    Estimated Creatinine Clearance: 79.8 mL/min (by C-G formula based on SCr of 0.96 mg/dL).   Medical History: Past Medical History:  Diagnosis Date  . Arthritis   . Asthma   . GERD (gastroesophageal reflux disease)     Assessment: 70 y/o M with a h/o Alzheimer's admitted with CP with STEMI. Nows/p cath (12/23) with DES to RCA. Pt for staged PCI to LAD/LCx on 11/27.   Heparin level remains at goal on 1200 units/h. CBC stable, no bleed documented.  Goal of Therapy:  Heparin level 0.3-0.7 units/ml Monitor platelets by anticoagulation protocol: Yes   Plan:  -Continue heparin at 1200 units/hr -Daily heparin level/CBC -Monitor for s/sx bleeding -Staged PCI to LAD/LCx on 11/27   Babs BertinHaley Azaiah Licciardi, PharmD, BCPS Clinical Pharmacist 02/14/2016 10:23 AM

## 2016-02-14 NOTE — Progress Notes (Signed)
CARDIAC REHAB PHASE I   PRE:  Rate/Rhythm: 63 SR  BP:  Supine:   Sitting: 159/78  Standing:    SaO2: 99%RA  MODE:  Ambulation: 500 ft   POST:  Rate/Rhythm: 84 SR  BP:  Supine:   Sitting: 146/82  Standing:    SaO2: 100%RA 1028-1102 Pt walked 500 ft on RA with gait belt use, rolling walker and asst x 2. He has tendency to cross feet and scissor feet when walking. Picked up walker when turning. Would benefit from PT consult to assess for home needs and gait improvement. To recliner with chair alarm and family in room. Tolerated well.   Luetta Nuttingharlene Nyema Hachey, RN BSN  02/14/2016 10:59 AM

## 2016-02-14 NOTE — Care Management Important Message (Signed)
Important Message  Patient Details  Name: Jeff Key MRN: [id number] Date of Birth: 1945-06-04   Medicare Important Message Given:  Yes    Kyla BalzarineShealy, Huntley Knoop Abena 02/14/2016, 11:31 AM

## 2016-02-14 NOTE — Care Management Note (Signed)
Case Management Note Donn PieriniKristi Ashaun Gaughan RN, BSN Unit 2W-Case Manager [phone]  Patient Details  Name: Jeff Key MRN: [id number] Date of Birth: 04-15-1945  Subjective/Objective:  [facility] with plan for staged PCI                  Action/Plan: PTA pt lived at home with wife- hx Alzheimers, anticipate return home- pt has been started on Brilinta- benefits check submitted on 11/27.   Expected Discharge Date:   (pending)               Expected Discharge Plan:  Home/Self Care  In-House Referral:     Discharge planning Services  CM Consult, Medication Assistance  Post Acute Care Choice:    Choice offered to:     DME Arranged:    DME Agency:     HH Arranged:    HH Agency:     Status of Service:  In process, will continue to follow  If discussed at Long Length of Stay Meetings, dates discussed:    Additional Comments:  Darrold SpanWebster, Teleshia Lemere Hall, RN 02/14/2016, 3:20 PM

## 2016-02-14 NOTE — Progress Notes (Signed)
Site area: right groin  Site Prior to Removal:  Level 1  Pressure Applied For 30 MINUTES    Minutes Beginning at 1900  Manual:   Yes.    Patient Status During Pull:  Agitated, lifting head up, moving around in bed, bearing down pushing against groin area holding. Groin area bleeding with increasing movement even with continuous pressure being held. Patients wife at bedside attempting to help patient calm. Patients agitation increased and patient attempting to get out of bed, bending legs up. Requested medication to calm patient. Patient given ativan 0.5 mg IV and repeated x1. Pressure dressing applied at 0730. Several staff members into assist with keeping patient from moving and preventing increased bleeding. Patient beginning to relax. During this time, continuously explaining procedure to patient but patient unable to remember instructions or reason for holding pressure. Verbalized that he was in his home and "why are you here in my home mashing on me." Patients son in law into help with calming patient and family came in. Patient responding well to family visiting and response to ativan.   Post Pull Groin Site:  Level 1  Post Pull Instructions Given:  Yes.    Post Pull Pulses Present:  Yes.    Dressing Applied:  Yes.    Comments:  Level 1, bruising and 2 cm hematoma distal to site unchanged. Present on admission to unit with no change noted.

## 2016-02-14 NOTE — Progress Notes (Signed)
Insurance check completed for Brilinta Pt copay will be $134.88 - member has reached the donut hole. Prior auth not required

## 2016-02-15 ENCOUNTER — Encounter (HOSPITAL_COMMUNITY): Payer: Self-pay | Admitting: Cardiovascular Disease

## 2016-02-15 DIAGNOSIS — F0280 Dementia in other diseases classified elsewhere without behavioral disturbance: Secondary | ICD-10-CM | POA: Diagnosis not present

## 2016-02-15 DIAGNOSIS — I255 Ischemic cardiomyopathy: Secondary | ICD-10-CM | POA: Diagnosis not present

## 2016-02-15 DIAGNOSIS — Z87891 Personal history of nicotine dependence: Secondary | ICD-10-CM | POA: Diagnosis not present

## 2016-02-15 DIAGNOSIS — I472 Ventricular tachycardia: Secondary | ICD-10-CM

## 2016-02-15 DIAGNOSIS — K219 Gastro-esophageal reflux disease without esophagitis: Secondary | ICD-10-CM | POA: Diagnosis not present

## 2016-02-15 DIAGNOSIS — I2510 Atherosclerotic heart disease of native coronary artery without angina pectoris: Secondary | ICD-10-CM | POA: Diagnosis present

## 2016-02-15 DIAGNOSIS — I4729 Other ventricular tachycardia: Secondary | ICD-10-CM

## 2016-02-15 DIAGNOSIS — R269 Unspecified abnormalities of gait and mobility: Secondary | ICD-10-CM | POA: Diagnosis not present

## 2016-02-15 DIAGNOSIS — I493 Ventricular premature depolarization: Secondary | ICD-10-CM | POA: Diagnosis not present

## 2016-02-15 DIAGNOSIS — G309 Alzheimer's disease, unspecified: Secondary | ICD-10-CM | POA: Diagnosis not present

## 2016-02-15 DIAGNOSIS — I2119 ST elevation (STEMI) myocardial infarction involving other coronary artery of inferior wall: Secondary | ICD-10-CM | POA: Diagnosis not present

## 2016-02-15 DIAGNOSIS — Z8042 Family history of malignant neoplasm of prostate: Secondary | ICD-10-CM | POA: Diagnosis not present

## 2016-02-15 DIAGNOSIS — Z7982 Long term (current) use of aspirin: Secondary | ICD-10-CM | POA: Diagnosis not present

## 2016-02-15 DIAGNOSIS — M1990 Unspecified osteoarthritis, unspecified site: Secondary | ICD-10-CM | POA: Diagnosis not present

## 2016-02-15 LAB — BASIC METABOLIC PANEL
ANION GAP: 8 (ref 5–15)
BUN: 14 mg/dL (ref 6–20)
CHLORIDE: 103 mmol/L (ref 101–111)
CO2: 24 mmol/L (ref 22–32)
Calcium: 8.6 mg/dL — ABNORMAL LOW (ref 8.9–10.3)
Creatinine, Ser: 1.08 mg/dL (ref 0.61–1.24)
Glucose, Bld: 120 mg/dL — ABNORMAL HIGH (ref 65–99)
POTASSIUM: 3.6 mmol/L (ref 3.5–5.1)
SODIUM: 135 mmol/L (ref 135–145)

## 2016-02-15 LAB — CBC
HEMATOCRIT: 31 % — AB (ref 39.0–52.0)
Hemoglobin: 10.2 g/dL — ABNORMAL LOW (ref 13.0–17.0)
MCH: 27.9 pg (ref 26.0–34.0)
MCHC: 32.9 g/dL (ref 30.0–36.0)
MCV: 84.7 fL (ref 78.0–100.0)
Platelets: 262 10*3/uL (ref 150–400)
RBC: 3.66 MIL/uL — AB (ref 4.22–5.81)
RDW: 15.7 % — AB (ref 11.5–15.5)
WBC: 11 10*3/uL — AB (ref 4.0–10.5)

## 2016-02-15 MED ORDER — ATORVASTATIN CALCIUM 80 MG PO TABS: 80.0000 mg | ORAL_TABLET | Freq: Every day | ORAL | 3 refills | Status: DC

## 2016-02-15 MED ORDER — LIVING BETTER WITH HEART FAILURE BOOK
Freq: Once | Status: AC
  Administered 2016-02-15: 06:00:00

## 2016-02-15 MED ORDER — NITROGLYCERIN 0.4 MG SL SUBL: 0.4000 mg | SUBLINGUAL_TABLET | SUBLINGUAL | 12 refills | Status: DC | PRN

## 2016-02-15 MED ORDER — LISINOPRIL 10 MG PO TABS
10.0000 mg | ORAL_TABLET | Freq: Every day | ORAL | 3 refills | Status: DC
Start: 2016-02-15 — End: 2016-06-10

## 2016-02-15 MED ORDER — METOPROLOL TARTRATE 25 MG PO TABS: 25.0000 mg | ORAL_TABLET | Freq: Two times a day (BID) | ORAL | 3 refills | Status: DC

## 2016-02-15 MED ORDER — TICAGRELOR 90 MG PO TABS: 90.0000 mg | ORAL_TABLET | Freq: Two times a day (BID) | ORAL | 3 refills | Status: DC

## 2016-02-15 MED ORDER — AMIODARONE HCL 200 MG PO TABS: 200.0000 mg | ORAL_TABLET | Freq: Two times a day (BID) | ORAL | 1 refills | Status: DC

## 2016-02-15 MED FILL — Nitroglycerin IV Soln 100 MCG/ML in D5W: INTRA_ARTERIAL | Qty: 10 | Status: AC

## 2016-02-15 NOTE — Discharge Summary (Signed)
Discharge Summary    Patient ID: Jeff Key,  MRN: [id number], DOB/AGE: 71-Mar-1947 71 y.o.  Admit date: 02/10/2016 Discharge date: 02/15/2016  Primary Care Provider: [name] Primary Cardiologist: New- Dr. Allyson Sabal    Discharge Diagnoses    Principal Problem:   STEMI involving oth coronary artery of inferior wall [facility]) Active Problems:   CAD (coronary artery disease)   Ischemic cardiomyopathy   NSVT (nonsustained ventricular tachycardia) ([facility])   Allergies No Known Allergies   History of Present Illness     Jeff Key is a 70 y.o. male with past medical history of asthma, GERD, and recent diagnosis of Alzheimer's disease who presented to [facility] on 02/10/2016 for evaluation of generalized weakness and chest discomfort. He was found to have an inferior STEMI and emergently transferred to [facility] for coronary angiography.   He was doing well until the morning of admission when he awoke with chest discomfort. Reported associated dyspnea and diaphoresis. He was very weak and unable to walk initially.   Upon arrival to [facility] ED, he reported having continual chest discomfort. His initial EKG showed ST elevation in the inferior leads. CODE STEMI was activated and he was transferred to [facility] for emergent cardiac catheterization.    Hospital Course     Consultants: care managment  CAD: Peak troponin ~40. Admitted with inferior STEMI on 02/10/16. RCA treated with DES. Staged PCI of LAD and Circumflex 02/14/16.  Will continue ASA, brilinta, statin, beta blocker, Ace-inh. Provided a Rx and 30 day free Brilinta card  Ischemic cardiomyopathy: LVEF=40-45%. Will continue medical management with ACE and BB therapy. Appears euvolemic off diuretic therapy.   NSVT and PVCs: noted after first intervention. Electrolytes (mag, K) were WNL. He was started on IV amiodarone which was later converted to 400mg  BID with good effect. Will decrease this to 200mg  BID now. Hopefully he  may be able to come off this eventually.    Dementia: he had significant agitation and confusion while admitted and required benzodiazepines and IV haldol. He was continued on Aricept while here but isn't actually taking this at home. He is only taking galantamine which he will continue upon discharge. Will discharge him with a rolling walker and HH PT.   The patient has had an uncomplicated hospital course and is recovering well. The femoral catheter site is stable. He has been seen by Dr. Clifton James today and deemed ready for discharge home. All follow-up appointments have been scheduled. Discharge medications are listed below.  _____________  Discharge Vitals Blood pressure 123/71, pulse 64, temperature 97.6 F (36.4 C), temperature source Axillary, resp. rate 16, weight 189 lb 9.5 oz (86 kg), SpO2 92 %.  Filed Weights   02/12/16 0518 02/14/16 0522 02/15/16 0200  Weight: 200 lb 13.4 oz (91.1 kg) 189 lb 9.5 oz (86 kg) 189 lb 9.5 oz (86 kg)    Labs & Radiologic Studies     CBC  Recent Labs  02/14/16 0357 02/15/16 0203  WBC 11.7* 11.0*  HGB 11.2* 10.2*  HCT 34.3* 31.0*  MCV 83.9 84.7  PLT 271 262   Basic Metabolic Panel  Recent Labs  02/14/16 0357 02/15/16 0203  NA 134* 135  K 3.6 3.6  CL 102 103  CO2 23 24  GLUCOSE 106* 120*  BUN 12 14  CREATININE 0.96 1.08  CALCIUM 8.7* 8.6*   Liver Function Tests No results for input(s): AST, ALT, ALKPHOS, BILITOT, PROT, ALBUMIN in the last 72 hours. No results for input(s):  LIPASE, AMYLASE in the last 72 hours. Cardiac Enzymes No results for input(s): CKTOTAL, CKMB, CKMBINDEX, TROPONINI in the last 72 hours. BNP Invalid input(s): POCBNP D-Dimer No results for input(s): DDIMER in the last 72 hours. Hemoglobin A1C No results for input(s): HGBA1C in the last 72 hours. Fasting Lipid Panel No results for input(s): CHOL, HDL, LDLCALC, TRIG, CHOLHDL, LDLDIRECT in the last 72 hours. Thyroid Function Tests No results for input(s):  TSH, T4TOTAL, T3FREE, THYROIDAB in the last 72 hours.  Invalid input(s): FREET3  Mr Brain Wo Contrast  Result Date: 01/21/2016 CLINICAL DATA:  70 year old male with memory impairment and confusion. Progressive symptoms for 1 year, worst in the last 2-3 weeks. Initial encounter. EXAM: MRI HEAD WITHOUT CONTRAST TECHNIQUE: Multiplanar, multiecho pulse sequences of the brain and surrounding structures were obtained without intravenous contrast. COMPARISON:  Head CT without contrast 11/28/2015. Brain MRI 10/01/2014. FINDINGS: Brain: No restricted diffusion to suggest acute infarction. No midline shift, mass effect, evidence of mass lesion, ventriculomegaly, extra-axial collection or acute intracranial hemorrhage. Cervicomedullary junction and pituitary are within normal limits. There has been mild additional generalized cerebral volume loss since July 2016. Stable gray and white matter signal throughout the brain. Scattered and patchy bilateral cerebral white matter T2 and FLAIR hyperintensity, which in some areas most resembles chronic white matter lacunar infarcts. Involvement of the deep white matter capsules. No cortical encephalomalacia or chronic cerebral blood products identified. Mild for age T2 heterogeneity in the deep gray matter nuclei which might in part reflect perivascular spaces. Negative brainstem and cerebellum. Vascular: Major intracranial vascular flow voids are stable with loss of the distal right vertebral artery flow void as noted in 2016. Somewhat dominant appearing distal left vertebral artery. Skull and upper cervical spine: Negative. Normal bone marrow signal. Sinuses/Orbits: Normal orbit soft tissues. Mild left maxillary sinus increased mucosal thickening. Other Visualized paranasal sinuses and mastoids are stable and well pneumatized. Other: Visible internal auditory structures appear normal. Negative scalp soft tissues. IMPRESSION: 1. No acute intracranial abnormality. Mild additional  generalized cerebral volume loss since 2016. 2. Otherwise stable noncontrast MRI appearance of the brain including moderate for age signal changes in the cerebral white matter and deep gray matter most commonly due to chronic small vessel disease. 3. 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 Electronically Signed   By: Odessa Fleming M.D.   On: 01/21/2016 10:14     Diagnostic Studies/Procedures    Cath 02/10/16 Coronary Stent Intervention  Left Heart Cath and Coronary Angiography  Conclusion    Mid Cx lesion, 80 %stenosed.  Prox Cx to Mid Cx lesion, 30 %stenosed.  1st Diag lesion, 95 %stenosed.  Mid LAD lesion, 90 %stenosed.  Prox RCA lesion, 100 %stenosed.  Post intervention, there is a 0% residual stenosis.  A stent was successfully placed.  The left ventricular ejection fraction is 45-50% by visual estimate.  There is mild to moderate left ventricular systolic dysfunction.   IMPRESSION: Successful PCI and drug-eluting stenting of a occluded proximal dominant RCA stenosis with a Synergy drug-eluting stent. The door to balloon time was 30 minutes. The patient has residual proximal LAD and mid to distal circumflex stenosis which will need to be addressed in a staged fashion. I will continue full dose Angiomax for 4 hours and discontinue this. The sheath will be removed and pressure held after that to achieve hemostasis. The patient will be treated with antiplatelet therapy including low-dose aspirin and Brilenta BID.      _____________  2D ECHO: 02/12/2016 LV EF: 40% -   45% Study Conclusions - Left ventricle: The cavity size was normal. Wall thickness was   normal. Systolic function was mildly to moderately reduced. The   estimated ejection fraction was in the range of 40% to 45%.   Hypokinesis of the basal-midinferolateral and inferior   myocardium.  _____________   02/14/16 Coronary Stent Intervention-LAD and CFX  Conclusion     Ost  RCA to Prox RCA lesion, 0 %stenosed.  Mid Cx lesion, 80 %stenosed.  Post intervention, there is a 0% residual stenosis.  A stent was successfully placed.  Mid LAD lesion, 90 %stenosed.  Post intervention, there is a 0% residual stenosis.  A stent was successfully placed.  IMPRESSION: Widely patent RCA from STEMI intervention 4 days ago with successful PCI and stenting of the mid circumflex and LAD using drug-eluting stents. The patient was already on aspirin and Brilenta. The guidewire and catheter were removed and the sheath was secured. Angiomax was discontinued. The sheath will be removed and pressure held. The patient left the lab in stable condition. He will be Gently hydrated and discharged home in the morning.     Disposition   Pt is being discharged home today in good condition.  Follow-up Plans & Appointments    Follow-up Information    Cline CrockKathryn Thompson, PA-C Follow up on 02/22/2016.   Specialties:  Cardiology, Radiology Why:  @ 9:30am. Please arrive 10 minutes early.  Contact information: [address] [place] [id number] [phone]          Discharge Instructions    AMB Referral to Cardiac Rehabilitation - Phase II    Complete by:  As directed    Diagnosis:  STEMI   AMB Referral to Cardiac Rehabilitation - Phase II    Complete by:  As directed    Diagnosis:  Coronary Stents   Amb Referral to Cardiac Rehabilitation    Complete by:  As directed    Diagnosis:   Coronary Stents STEMI PTCA        Discharge Medications     Medication List    TAKE these medications   amiodarone 200 MG tablet Commonly known as:  PACERONE Take 1 tablet (200 mg total) by mouth 2 (two) times daily.   aspirin EC 81 MG tablet Take 81 mg by mouth daily.   atorvastatin 80 MG tablet Commonly known as:  LIPITOR Take 1 tablet (80 mg total) by mouth daily at 6 PM.   ferrous sulfate 325 (65 FE) MG tablet Take 325 mg by mouth daily with breakfast.     galantamine 4 MG tablet Commonly known as:  RAZADYNE Take 4 mg by mouth daily.   lisinopril 10 MG tablet Commonly known as:  PRINIVIL,ZESTRIL Take 1 tablet (10 mg total) by mouth daily.   metoprolol tartrate 25 MG tablet Commonly known as:  LOPRESSOR Take 1 tablet (25 mg total) by mouth 2 (two) times daily.   nitroGLYCERIN 0.4 MG SL tablet Commonly known as:  NITROSTAT Place 1 tablet (0.4 mg total) under the tongue every 5 (five) minutes as needed for chest pain.   omeprazole 20 MG capsule Commonly known as:  PRILOSEC Take 20 mg by mouth daily.   sulfaSALAzine 500 MG EC tablet Commonly known as:  AZULFIDINE Take 1,000 mg by mouth 2 (two) times daily.   ticagrelor 90 MG Tabs tablet Commonly known as:  BRILINTA Take 1 tablet (90 mg total) by mouth 2 (two) times  daily.   vitamin B-12 1000 MCG tablet Commonly known as:  CYANOCOBALAMIN Take 1,000 mcg by mouth daily.       Aspirin prescribed at discharge?  Yes High Intensity Statin Prescribed? (Lipitor 40-80mg  or Crestor 20-40mg ): Yes Beta Blocker Prescribed? Yes For EF 45% or less, Was ACEI/ARB Prescribed? Yes ADP Receptor Inhibitor Prescribed? (i.e. Plavix etc.-Includes Medically Managed Patients): Yes For EF <45%, Aldosterone Inhibitor Prescribed? No, hypotension Was EF assessed during THIS hospitalization? Yes Was Cardiac Rehab II ordered? (Included Medically managed Patients): Yes   Outstanding Labs/Studies   none  Duration of Discharge Encounter   Greater than 30 minutes including physician time.  Signed, Cline CrockKathryn Thompson PA-C 02/15/2016, 10:52 AM   I have personally seen and examined this patient. I agree with the assessment and plan as outlined above.  See my full note this am.   Verne CarrowChristopher Spring San 02/15/2016 11:16 AM

## 2016-02-15 NOTE — Progress Notes (Addendum)
     SUBJECTIVE: No chest pain or dyspnea.   Tele: sinus  BP 123/71 (BP Location: Right Arm)   Pulse 64   Temp 97.6 F (36.4 C) (Axillary)   Resp 16   Wt 189 lb 9.5 oz (86 kg)   SpO2 92%   BMI 25.01 kg/m   Intake/Output Summary (Last 24 hours) at 02/15/16 0837 Last data filed at 02/15/16 0500  Gross per 24 hour  Intake             1340 ml  Output             1600 ml  Net             -260 ml    PHYSICAL EXAM General: Well developed, well nourished, in no acute distress. Alert and oriented x 3.  Psych:  Good affect, responds appropriately Neck: No JVD. No masses noted.  Lungs: Clear bilaterally with no wheezes or rhonci noted.  Heart: RRR with no murmurs noted. Abdomen: Bowel sounds are present. Soft, non-tender.  Extremities: No lower extremity edema.   LABS: Basic Metabolic Panel:  Recent Labs  09/81/1911/27/17 0357 02/15/16 0203  NA 134* 135  K 3.6 3.6  CL 102 103  CO2 23 24  GLUCOSE 106* 120*  BUN 12 14  CREATININE 0.96 1.08  CALCIUM 8.7* 8.6*   CBC:  Recent Labs  02/14/16 0357 02/15/16 0203  WBC 11.7* 11.0*  HGB 11.2* 10.2*  HCT 34.3* 31.0*  MCV 83.9 84.7  PLT 271 262    Current Meds: . amiodarone  400 mg Oral BID  . aspirin  81 mg Oral Daily  . atorvastatin  80 mg Oral q1800  . donepezil  5 mg Oral QHS  . lisinopril  10 mg Oral Daily  . metoprolol tartrate  25 mg Oral BID  . pantoprazole  40 mg Oral Daily  . sodium chloride flush  3 mL Intravenous Q12H  . ticagrelor  90 mg Oral BID    ASSESSMENT AND PLAN: 70 yo male admitted with inferior STEMI on 02/10/16. RCA treated with DES. Staged PCI of LAD and Circumflex 02/15/16.   1. CAD: Admitted with inferior STEMI on 02/10/16. RCA treated with DES. Staged PCI of LAD and Circumflex 02/15/16. Stable this am. No chest pain. Will continue ASA, brilinta, statin, beta blocker, Ace-inh. 30 day free Brilinta card. Discharge home today. Follow up with Dr. Allyson SabalBerry or office APP in 1-2 weeks.    2. Ischemic  cardiomyopathy: LVEF=40-45%. Will continue medical management  D/C home. He will need rolling walker and HH PT for safety eval per requests of cardiac rehab team.      Jeff Key  11/28/20178:37 AM

## 2016-02-15 NOTE — Progress Notes (Signed)
CARDIAC REHAB PHASE I   PRE:  Rate/Rhythm: 74 SR    BP: sitting 121/61    SaO2:   MODE:  Ambulation: 450 ft (100 ft with RW)   POST:  Rate/Rhythm: 90 SR with PVCs    BP: sitting 146/68     SaO2:   Pt able to stand independently, walked with assist x2 and gait belt for safety. Put pts shoes on as he c/o sock feet yesterday. Pt with less scissoring of feet today with his shoes on. Used RW for 100 ft then had pt walk without RW. Pt able to take more purposeful steps with RW, making him steadier. Did not struggle on turns like he did yesterday. Without RW, pt with more ataxic gait, steps hesitated frequently. Suggest RW and HHPT for safety eval at home. Pts wife sts he has fallen x2. She is with him almost always and even has him riding a bike in the driveway for exercise.   Ed completed with pt and family (wife and all three daughters). Understand importance of Brilinta/ASA, ex, and CRPII. Will send referral to  CRPII. Discussed pts elevated A1C and encouraged decreasing sugars where possible.  [id number]   Harriet MassonRandi Kristan Kayveon Lennartz CES, ACSM 02/15/2016 9:12 AM

## 2016-02-15 NOTE — Discharge Instructions (Signed)

## 2016-02-15 NOTE — Care Management Note (Addendum)
Case Management Note  Patient Details  Name: Jeff Key MRN: [id number] Date of Birth: [birth year]  Subjective/Objective:   S/p intervention, lives with wife, pta indep.  NCM gave patient 30 day savings card for brilinta, he will be going [facility] [address] in [place] and they do have in stock.  Patient co pay is  134.88 right now because he is in doughnut hole.  NCM informed patient that his cardiologist also has samples in the office which may be able to hold him over til Jan when he will be out of the doughnut hole.  He is for dc today.  Patient wanted regular rolling walker , did not want the rollator with seat.  [facility], walker was brought up to room.                 Action/Plan:   Expected Discharge Date:   (pending)               Expected Discharge Plan:  Home/Self Care  In-House Referral:     Discharge planning Services  CM Consult, Medication Assistance  Post Acute Care Choice:    Choice offered to:     DME Arranged:   Rolling Walker DME Agency:   [facility]  HH Arranged:    HH Agency:     Status of Service:  Completed, signed off  If discussed at Long Length of Stay Meetings, dates discussed:    Additional Comments:  [name], Zyiah Withington [place], RN 02/15/2016, 9:25 AM

## 2016-02-20 NOTE — Progress Notes (Signed)
Cardiology Office Note    Date:  02/22/2016   ID:  Jeff Key, DOB 19-Nov-1945, MRN [id number]  PCP:  Barbette ReichmannHANDE,VISHWANATH, MD  Cardiologist:  Dr. Allyson SabalBerry  CC: post hospital follow up  History of Present Illness:  Jeff Key is a 70 y.o. male with a history of asthma, GERD, Alzheimer's disease and recently diagnosed CAD who presents to clinic for post hospital follow up.   He was admitted from 11/23-11/28/17. [facility]2016 for evaluation of generalized weakness and chest discomfort. He was found to have an inferior STEMI and emerg[facility]. Peak troponin ~40. His RCA was treated with DES on 11/23 and he underwent staged PCI of LAD and Circumflex on 02/14/16. 2D ECHO showed EF 40-45%. He was placed on ASA and Brilinta. He also had some NSVT and PVCs after his first intervention which resolved on IV amiodarone. This was later converted to 400mg  BID and he was discharged on 200mg  BID.   Today he presents to clinic for follow up. No chest pain or SOB. He has been walking at least 2x a day at least 10 minutes with no chest pain or SOB. No LE edema, orthopnea or PND. No dizziness or syncope. Does have some bruising around cath site. No pain.    Past Medical History:  Diagnosis Date  . Anemia ~ 11/2015  . CAD (coronary artery disease)    a. inf STEMI on 02/10/16. RCA treated with DES followed by staged PCI of LAD and Circumflex 02/14/16.  Marland Kitchen. Dementia dx'd 01/2016  . GERD (gastroesophageal reflux disease)   . History of stomach ulcers 1980s?  . Ischemic cardiomyopathy    a. 01/2016: EF 40-45%.     Past Surgical History:  Procedure Laterality Date  . CARDIAC CATHETERIZATION N/A 02/10/2016   Procedure: Left Heart Cath and Coronary Angiography;  Surgeon: Runell GessJonathan J Berry, MD;  [facility];  Service: Cardiovascular;  Laterality: N/A;  . CARDIAC CATHETERIZATION N/A 02/10/2016   Procedure: Coronary  Stent Intervention;  Surgeon: Runell GessJonathan J Berry, MD;  Location: [facility] INVASIVE CV LAB;  Service: Cardiovascular;  Laterality: N/A;  . CARDIAC CATHETERIZATION N/A 02/14/2016   Procedure: Coronary Stent Intervention-LAD and CFX;  Surgeon: Runell GessJonathan J Berry, MD;  Location: [facility] INVASIVE CV LAB;  Service: Cardiovascular;  Laterality: N/A;  . COLONOSCOPY WITH PROPOFOL N/A 05/06/2015   Procedure: COLONOSCOPY WITH PROPOFOL;  Surgeon: Wallace CullensPaul Y Oh, MD;  [facility];  Service: Gastroenterology;  Laterality: N/A;  . CORONARY ANGIOPLASTY WITH STENT PLACEMENT  02/14/2016   "2 stents"  . ESOPHAGOGASTRODUODENOSCOPY (EGD) WITH PROPOFOL N/A 05/06/2015   Procedure: ESOPHAGOGASTRODUODENOSCOPY (EGD) WITH PROPOFOL;  Surgeon: Wallace CullensPaul Y Oh, MD;  [facility] END[facility]COPY;  Service: Gastroenterology;  Laterality: N/A;  . FOREARM FRACTURE SURGERY Right 1985   "got it tore up in Aireator"  . FRACTURE SURGERY      Current Medications: Outpatient Medications Prior to Visit  Medication Sig Dispense Refill  . aspirin EC 81 MG tablet Take 81 mg by mouth daily.    Marland Kitchen. atorvastatin (LIPITOR) 80 MG tablet Take 1 tablet (80 mg total) by mouth daily at 6 PM. 90 tablet 3  . ferrous sulfate 325 (65 FE) MG tablet Take 325 mg by mouth daily with breakfast.    . galantamine (RAZADYNE) 4 MG tablet Take 4 mg by mouth daily.    Marland Kitchen. lisinopril (PRINIVIL,ZESTRIL) 10 MG tablet Take 1 tablet (10 mg total) by mouth  daily. 30 tablet 3  . metoprolol tartrate (LOPRESSOR) 25 MG tablet Take 1 tablet (25 mg total) by mouth 2 (two) times daily. 60 tablet 3  . nitroGLYCERIN (NITROSTAT) 0.4 MG SL tablet Place 1 tablet (0.4 mg total) under the tongue every 5 (five) minutes as needed for chest pain. 25 tablet 12  . omeprazole (PRILOSEC) 20 MG capsule Take 20 mg by mouth daily.    Marland Kitchen sulfaSALAzine (AZULFIDINE) 500 MG EC tablet Take 1,000 mg by mouth 2 (two) times daily.    . ticagrelor (BRILINTA) 90 MG TABS tablet Take 1 tablet (90 mg total) by mouth 2  (two) times daily. 180 tablet 3  . vitamin B-12 (CYANOCOBALAMIN) 1000 MCG tablet Take 1,000 mcg by mouth daily.    Marland Kitchen amiodarone (PACERONE) 200 MG tablet Take 1 tablet (200 mg total) by mouth 2 (two) times daily. 60 tablet 1   No facility-administered medications prior to visit.      Allergies:   Patient has no known allergies.   Social History   Social History  . Marital status: Married    Spouse name: N/A  . Number of children: N/A  . Years of education: N/A   Social History Main Topics  . Smoking status: Former Smoker    Packs/day: 2.00    Years: 30.00    Types: Cigarettes    Quit date: 19  . Smokeless tobacco: Never Used  . Alcohol use No  . Drug use: No  . Sexual activity: No   Other Topics Concern  . None   Social History Narrative  . None     Family History:  The patient's family history includes Prostate cancer in his father.     ROS:   Please see the history of present illness.    ROS All other systems reviewed and are negative.   PHYSICAL EXAM:   VS:  BP 110/64   Pulse (!) 52   Ht 6\' 1"  (1.854 m)   Wt 195 lb (88.5 kg)   BMI 25.73 kg/m    GEN: Well nourished, well developed, in no acute distress  HEENT: normal  Neck: no JVD, carotid bruits, or masses Cardiac: RRR; no murmurs, rubs, or gallops,no edema  Respiratory:  clear to auscultation bilaterally, normal work of breathing GI: soft, nontender, nondistended, + BS MS: no deformity or atrophy  Skin: warm and dry, no rash, he has some receeding hematoma at groin site with an audible bruit. Neuro:  Alert and Oriented x 3, Strength and sensation are intact Psych: euthymic mood, full affect  Wt Readings from Last 3 Encounters:  02/22/16 195 lb (88.5 kg)  02/15/16 189 lb 9.5 oz (86 kg)  02/10/16 190 lb (86.2 kg)      Studies/Labs Reviewed:   EKG:  EKG is ordered today.  The ekg ordered today demonstrates sinus bradycardia with inferior Q waves and TWI in III and AVF similar to previous.    Recent Labs: 11/28/2015: ALT 14; TSH 1.361 02/11/2016: Magnesium 2.8 02/15/2016: BUN 14; Creatinine, Ser 1.08; Hemoglobin 10.2; Platelets 262; Potassium 3.6; Sodium 135   Lipid Panel No results found for: CHOL, TRIG, HDL, CHOLHDL, VLDL, LDLCALC, LDLDIRECT  Additional studies/ records that were reviewed today include:  Cath 02/10/16 Coronary Stent Intervention  Left Heart Cath and Coronary Angiography  Conclusion    Mid Cx lesion, 80 %stenosed.  Prox Cx to Mid Cx lesion, 30 %stenosed.  1st Diag lesion, 95 %stenosed.  Mid LAD lesion, 90 %stenosed.  Prox RCA  lesion, 100 %stenosed.  Post intervention, there is a 0% residual stenosis.  A stent was successfully placed.  The left ventricular ejection fraction is 45-50% by visual estimate.  There is mild to moderate left ventricular systolic dysfunction.  IMPRESSION:Successful PCI and drug-eluting stenting of a occluded proximal dominant RCA stenosis with a Synergy drug-eluting stent. The door to balloon time was 30 minutes. The patient has residual proximal LAD and mid to distal circumflex stenosis which will need to be addressed in a staged fashion. I will continue full dose Angiomax for 4 hours and discontinue this. The sheath will be removed and pressure held after that to achieve hemostasis. The patient will be treated with antiplatelet therapy including low-dose aspirin and Brilenta BID.      _____________   2D ECHO: 02/12/2016 LV EF: 40% - 45% Study Conclusions - Left ventricle: The cavity size was normal. Wall thickness was normal. Systolic function was mildly to moderately reduced. The estimated ejection fraction was in the range of 40% to 45%. Hypokinesis of the basal-midinferolateral and inferior myocardium.  _____________   02/14/16 Coronary Stent Intervention-LAD and CFX  Conclusion     Ost RCA to Prox RCA lesion, 0 %stenosed.  Mid Cx lesion, 80 %stenosed.  Post  intervention, there is a 0% residual stenosis.  A stent was successfully placed.  Mid LAD lesion, 90 %stenosed.  Post intervention, there is a 0% residual stenosis.  A stent was successfully placed. IMPRESSION:Widely patent RCA from STEMI intervention 4 days ago with successful PCI and stenting of the mid circumflex and LAD using drug-eluting stents. The patient was already on aspirin and Brilenta. The guidewire and catheter were removed and the sheath was secured. Angiomax was discontinued. The sheath will be removed and pressure held. The patient left the lab in stable condition. He will be Gently hydrated and discharged home in the morning.       ASSESSMENT & PLAN:   CAD:  inferior STEMI on 02/10/16 s/p DES to RCA and staged PCI of LAD and LCx on 02/14/16.  Will continue ASA, brilinta, statin, beta blocker, Ace-inh. He does have a bruit at his femoral cath site and hematoma. Will get an ultrasound to rule out pseudoaneurysm.   Ischemic cardiomyopathy: LVEF=40-45%. Will continue medical management with ACE and BB therapy. Appears euvolemic off diuretic therapy.   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 Will further decrease this to 200mg  daily. Hopefully, he will be able to come off this next appointment.   Dementia: continue galantamine    Medication Adjustments/Labs and Tests Ordered: Current medicines are reviewed at length with the patient today.  Concerns regarding medicines are outlined above.  Medication changes, Labs and Tests ordered today are listed in the Patient Instructions below. Patient Instructions  Medication Instructions:  Your physician has recommended you make the following change in your medication:  1.  REDUCE Amiodarone 200 mg taking 1 tablet daily   Labwork: None ordered  Testing/Procedures: Your physician recommends that you have a Vascular Ultrasound of the groin to monitor the hematoma ASAP.    Follow-Up: Your physician  recommends that you schedule a follow-up appointment in: 3 MONTHS WITH DR. Allyson Sabal   Any Other Special Instructions Will Be Listed Below (If Applicable).  \Vascular Ultrasound Introduction An ultrasound, also called sonography or ultrasonography, uses harmless sound waves to take pictures of the inside of your body. The pictures are taken with a device called a transducer that is held up against your body. The  continually changing pictures Key be recorded on videotape or film. A vascular ultrasound is a painless test to see if you have blood flow problems or clots in your blood vessels. It may be done to look at blood vessels almost anywhere in the body. There are several types of ultrasounds that Key be done to look at the blood vessels. They include:  Continuous wave Doppler ultrasound. This type of ultrasound uses the change in pitch of sound waves to provide information about blood flow through a blood vessel. During the test, a health care provider listens to the sounds produced by the transducer.  Duplex ultrasound. This type of ultrasound uses standard ultrasound methods to produce a picture of a blood vessel and surrounding organs. In addition, a computer provides information about the speed and direction of blood flow through the blood vessel. With this type of ultrasound it is possible to see the structures inside the body and to evaluate blood flow within those structures at the same time.  Color Doppler ultrasound. This type of ultrasound uses standard ultrasound methods to produce a picture of a blood vessel. In addition, a computer converts the Doppler sounds into colors that are overlaid on the picture of the blood vessel. These colors represent the speed and direction of blood flow through the vessel.  Power Doppler ultrasound. This type of ultrasound is up to five times more sensitive than color Doppler ultrasound. Power Doppler ultrasound Key also get pictures that are difficult or  impossible to get using standard color Doppler ultrasound. Power Doppler ultrasound is most commonly used to evaluate blood flow through vessels within organs, such as the liver or kidneys.  Transcranial Doppler ultrasound. This type of ultrasound looks at blood flow in blood vessels throughout the brain. It Key reveal the presence of narrow arteries, clots blocking the vessels, or malformed blood vessels. What are the risks? There are no known risks or complications of having an ultrasound. What happens before the procedure?  If the ultrasound scan involves your upper abdomen, you may be directed not to eat, smoke, or chew gum the morning of your exam. Follow your health care provider's instructions.  During the test, a gel will be applied to your skin. Wear clothing that is easily washable in case the gel gets on your clothes. What happens during the procedure?  A gel will be applied to your skin. It may feel cool.  The transducer will be placed on the area to be examined.  Pictures will be taken. They will be displayed on one or more monitors that look like small television screens. What happens after the procedure?  You Key safely drive home and return to regular activities immediately after your exam.  Keep follow-up visits as directed by your health care provider.  Ask when your test results will be ready. It is your responsibility to get your test results. This information is not intended to replace advice given to you by your health care provider. Make sure you discuss any questions you have with your health care provider. Document Released: 03/17/2004 Document Revised: 08/12/2015 Document Reviewed: 05/29/2013  2017 Elsevier    If you need a refill on your cardiac medications before your next appointment, please call your pharmacy.      Signed, Cline CrockKathryn Thompson, PA-C  02/22/2016 10:07 AM    [facility] [address], [place], [place]   [address] Phone: [phone]; Fax: [phone]

## 2016-02-22 ENCOUNTER — Ambulatory Visit (INDEPENDENT_AMBULATORY_CARE_PROVIDER_SITE_OTHER): Payer: PPO | Admitting: Physician Assistant

## 2016-02-22 ENCOUNTER — Ambulatory Visit (HOSPITAL_COMMUNITY)
Admission: RE | Admit: 2016-02-22 | Discharge: 2016-02-22 | Disposition: A | Discharge status: Home / Self Care | Payer: PPO | Source: Ambulatory Visit | Attending: Cardiology | Admitting: Cardiology

## 2016-02-22 ENCOUNTER — Encounter: Payer: Self-pay | Admitting: Physician Assistant

## 2016-02-22 VITALS — BP 110/64 | HR 52 | Ht 73.0 in | Wt 195.0 lb

## 2016-02-22 DIAGNOSIS — T148XXA Other injury of unspecified body region, initial encounter: Secondary | ICD-10-CM

## 2016-02-22 DIAGNOSIS — I2111 ST elevation (STEMI) myocardial infarction involving right coronary artery: Secondary | ICD-10-CM | POA: Diagnosis not present

## 2016-02-22 DIAGNOSIS — I472 Ventricular tachycardia: Secondary | ICD-10-CM

## 2016-02-22 DIAGNOSIS — I4729 Other ventricular tachycardia: Secondary | ICD-10-CM

## 2016-02-22 DIAGNOSIS — I255 Ischemic cardiomyopathy: Secondary | ICD-10-CM

## 2016-02-22 DIAGNOSIS — I2510 Atherosclerotic heart disease of native coronary artery without angina pectoris: Secondary | ICD-10-CM

## 2016-02-22 DIAGNOSIS — I724 Aneurysm of artery of lower extremity: Secondary | ICD-10-CM | POA: Diagnosis not present

## 2016-02-22 DIAGNOSIS — F0280 Dementia in other diseases classified elsewhere without behavioral disturbance: Secondary | ICD-10-CM

## 2016-02-22 DIAGNOSIS — I729 Aneurysm of unspecified site: Secondary | ICD-10-CM | POA: Insufficient documentation

## 2016-02-22 DIAGNOSIS — X58XXXA Exposure to other specified factors, initial encounter: Secondary | ICD-10-CM | POA: Insufficient documentation

## 2016-02-22 DIAGNOSIS — G309 Alzheimer's disease, unspecified: Secondary | ICD-10-CM

## 2016-02-22 MED ORDER — AMIODARONE HCL 200 MG PO TABS: 200.0000 mg | ORAL_TABLET | Freq: Every day | ORAL | 3 refills | Status: DC

## 2016-02-22 NOTE — Patient Instructions (Addendum)
Medication Instructions:  Your physician has recommended you make the following change in your medication:  1.  REDUCE Amiodarone 200 mg taking 1 tablet daily   Labwork: None ordered  Testing/Procedures: Your physician recommends that you have a Vascular Ultrasound of the groin to monitor the hematoma ASAP.    Follow-Up: Your physician recommends that you schedule a follow-up appointment in: 3 MONTHS WITH DR. Allyson SabalBERRY   Any Other Special Instructions Will Be Listed Below (If Applicable).  \Vascular Ultrasound Introduction An ultrasound, also called sonography or ultrasonography, uses harmless sound waves to take pictures of the inside of your body. The pictures are taken with a device called a transducer that is held up against your body. The continually changing pictures can be recorded on videotape or film. A vascular ultrasound is a painless test to see if you have blood flow problems or clots in your blood vessels. It may be done to look at blood vessels almost anywhere in the body. There are several types of ultrasounds that can be done to look at the blood vessels. They include:  Continuous wave Doppler ultrasound. This type of ultrasound uses the change in pitch of sound waves to provide information about blood flow through a blood vessel. During the test, a health care provider listens to the sounds produced by the transducer.  Duplex ultrasound. This type of ultrasound uses standard ultrasound methods to produce a picture of a blood vessel and surrounding organs. In addition, a computer provides information about the speed and direction of blood flow through the blood vessel. With this type of ultrasound it is possible to see the structures inside the body and to evaluate blood flow within those structures at the same time.  Color Doppler ultrasound. This type of ultrasound uses standard ultrasound methods to produce a picture of a blood vessel. In addition, a computer converts the  Doppler sounds into colors that are overlaid on the picture of the blood vessel. These colors represent the speed and direction of blood flow through the vessel.  Power Doppler ultrasound. This type of ultrasound is up to five times more sensitive than color Doppler ultrasound. Power Doppler ultrasound can also get pictures that are difficult or impossible to get using standard color Doppler ultrasound. Power Doppler ultrasound is most commonly used to evaluate blood flow through vessels within organs, such as the liver or kidneys.  Transcranial Doppler ultrasound. This type of ultrasound looks at blood flow in blood vessels throughout the brain. It can reveal the presence of narrow arteries, clots blocking the vessels, or malformed blood vessels. What are the risks? There are no known risks or complications of having an ultrasound. What happens before the procedure?  If the ultrasound scan involves your upper abdomen, you may be directed not to eat, smoke, or chew gum the morning of your exam. Follow your health care provider's instructions.  During the test, a gel will be applied to your skin. Wear clothing that is easily washable in case the gel gets on your clothes. What happens during the procedure?  A gel will be applied to your skin. It may feel cool.  The transducer will be placed on the area to be examined.  Pictures will be taken. They will be displayed on one or more monitors that look like small television screens. What happens after the procedure?  You can safely drive home and return to regular activities immediately after your exam.  Keep follow-up visits as directed by your health care provider.  Ask when your test results will be ready. It is your responsibility to get your test results. This information is not intended to replace advice given to you by your health care provider. Make sure you discuss any questions you have with your health care provider. Document  Released: 03/17/2004 Document Revised: 08/12/2015 Document Reviewed: 05/29/2013  2017 Elsevier    If you need a refill on your cardiac medications before your next appointment, please call your pharmacy.

## 2016-02-23 ENCOUNTER — Ambulatory Visit (INDEPENDENT_AMBULATORY_CARE_PROVIDER_SITE_OTHER): Payer: PPO | Admitting: Cardiovascular Disease

## 2016-02-23 ENCOUNTER — Encounter: Payer: Self-pay | Admitting: Cardiovascular Disease

## 2016-02-23 VITALS — BP 120/64 | HR 53 | Ht 70.0 in | Wt 195.4 lb

## 2016-02-23 DIAGNOSIS — I724 Aneurysm of artery of lower extremity: Secondary | ICD-10-CM

## 2016-02-23 DIAGNOSIS — I2119 ST elevation (STEMI) myocardial infarction involving other coronary artery of inferior wall: Secondary | ICD-10-CM

## 2016-02-23 DIAGNOSIS — I255 Ischemic cardiomyopathy: Secondary | ICD-10-CM

## 2016-02-23 NOTE — Assessment & Plan Note (Signed)
Jeff Key had an inferior STEMI 02/10/16 with PCI and drug-eluting stenting of his proximal dominant RCA. His EF was 40% range. 3 days later he underwent staged LAD and circumflex PCI and drug-eluting stenting of the right femoral approach. He was discharged home on the 28th on dual antiplatelet therapy. He denies chest pain or shortness of breath.

## 2016-02-23 NOTE — Assessment & Plan Note (Signed)
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

## 2016-02-23 NOTE — Progress Notes (Signed)
02/23/2016 Jeff Key   1945-12-18  [id number]  Primary Physician Barbette ReichmannHANDE,VISHWANATH, MD Primary Cardiologist: Runell GessJonathan J Berry MD Roseanne RenoFACP, FACC, FAHA, FSCAI  HPI:  Jeff Key is a 70 year old married Caucasian male father of 3, grandfather and 6 grandchildren who [place] in [place] buildings. He has newly diagnosed Alzheimer's disease and was recently admitted to [facility] in September for pneumonia. He was brought into the ER on Thanksgiving day with nonspecific symptoms however he was complaining of chest pain apparently and an EKG showed inferior ST segment elevation. He was transferred by EMS to come in for angiography and urgent intervention. He had a totally occluded proximal dominant RCA stenosis which I stented using a synergy drug-eluting stent. He had residual mid LAD and circumflex disease. His EF was mildly depressed. Treated with aspirin and Brilenta has remained stable. His troponin peaked at 40. He underwent staged LAD and circumflex PCI and drug-eluting stenting. Right femoral approach on 02/15/16 and this was uncomplicated. He was discharged on the following day. Apparently he was seen in early follow-up in the office and a bruit was noted. Duplex performed yesterday showed a small right common femoral pseudoaneurysm which he is asymptomatic from.  Current Outpatient Prescriptions  Medication Sig Dispense Refill  . amiodarone (PACERONE) 200 MG tablet Take 1 tablet (200 mg total) by mouth daily. 90 tablet 3  . aspirin EC 81 MG tablet Take 81 mg by mouth daily.    Marland Kitchen. atorvastatin (LIPITOR) 80 MG tablet Take 1 tablet (80 mg total) by mouth daily at 6 PM. 90 tablet 3  . ferrous sulfate 325 (65 FE) MG tablet Take 325 mg by mouth daily with breakfast.    . galantamine (RAZADYNE) 4 MG tablet Take 4 mg by mouth daily.    Marland Kitchen. lisinopril (PRINIVIL,ZESTRIL) 10 MG tablet Take 1 tablet (10 mg total) by mouth daily. 30 tablet 3  . metoprolol tartrate (LOPRESSOR) 25 MG tablet Take  1 tablet (25 mg total) by mouth 2 (two) times daily. 60 tablet 3  . nitroGLYCERIN (NITROSTAT) 0.4 MG SL tablet Place 1 tablet (0.4 mg total) under the tongue every 5 (five) minutes as needed for chest pain. 25 tablet 12  . omeprazole (PRILOSEC) 20 MG capsule Take 20 mg by mouth daily.    Marland Kitchen. sulfaSALAzine (AZULFIDINE) 500 MG EC tablet Take 1,000 mg by mouth 2 (two) times daily.    . ticagrelor (BRILINTA) 90 MG TABS tablet Take 1 tablet (90 mg total) by mouth 2 (two) times daily. 180 tablet 3  . vitamin B-12 (CYANOCOBALAMIN) 1000 MCG tablet Take 1,000 mcg by mouth daily.     No current facility-administered medications for this visit.     No Known Allergies  Social History   Social History  . Marital status: Married    Spouse name: N/A  . Number of children: N/A  . Years of education: N/A   Occupational History  . Not on file.   Social History Main Topics  . Smoking status: Former Smoker    Packs/day: 2.00    Years: 30.00    Types: Cigarettes    Quit date: 621990  . Smokeless tobacco: Never Used  . Alcohol use No  . Drug use: No  . Sexual activity: No   Other Topics Concern  . Not on file   Social History Narrative  . No narrative on file     Review of Systems: General: negative for chills, fever, night sweats or weight changes.  Cardiovascular: negative for chest pain, dyspnea on exertion, edema, orthopnea, palpitations, paroxysmal nocturnal dyspnea or shortness of breath Dermatological: negative for rash Respiratory: negative for cough or wheezing Urologic: negative for hematuria Abdominal: negative for nausea, vomiting, diarrhea, bright red blood per rectum, melena, or hematemesis Neurologic: negative for visual changes, syncope, or dizziness All other systems reviewed and are otherwise negative except as noted above.    Blood pressure 120/64, pulse (!) 53, height 5\' 10"  (1.778 m), weight 195 lb 6.4 oz (88.6 kg), SpO2 100 %.  General appearance: alert and no  distress Neck: no adenopathy, no carotid bruit, no JVD, supple, symmetrical, trachea midline and thyroid not enlarged, symmetric, no tenderness/mass/nodules Lungs: clear to auscultation bilaterally Heart: regular rate and rhythm, S1, S2 normal, no murmur, click, rub or gallop Extremities: Resolving hematoma right groin as well as ecchymosis. Soft bruit  EKG not  performed today  ASSESSMENT AND PLAN:   STEMI involving oth coronary artery of inferior wall [facility](HCC) [name] had an inferior STEMI 02/10/16 with PCI and drug-eluting stenting of his proximal dominant RCA. His EF was 40% range. 3 days later he underwent staged LAD and circumflex PCI and drug-eluting stenting of the right femoral approach. He was discharged home on the 28th on dual antiplatelet therapy. He denies chest pain or shortness of breath.  Ischemic cardiomyopathy History of ischemic cardiac myopathy with an EF of 40-45% at the time of his STEMI on appropriate medications. We will recheck a 2-D echo in 3 months prior to his next office visit.  NSVT (nonsustained ventricular tachycardia) (HCC) History of nonsustained VT in the setting of a inferior STEMI on oral amiodarone.  Pseudoaneurysm of right femoral artery (HCC) Jeff Key has a right common femoral pseudoaneurysm by duplex ultrasound yesterday in our office. Does have resolving ecchymosis and a soft bruit. He is however a symptomatic from this and denies pain. He has a relatively wide neck by ultrasound as well. Because he is a symptomatic we will repeat Doppler him in 3 months. I do not see any pressing need to either compress or inject this at this time.      Runell GessJonathan J. Berry MD FACP,FACC,FAHA, [facility] 02/23/2016 10:51 AM

## 2016-02-23 NOTE — Assessment & Plan Note (Signed)
History of ischemic cardiac myopathy with an EF of 40-45% at the time of his STEMI on appropriate medications. We will recheck a 2-D echo in 3 months prior to his next office visit.

## 2016-02-23 NOTE — Patient Instructions (Addendum)
Medication Instructions: Your physician recommends that you continue on your current medications as directed. Please refer to the Current Medication list given to you today.   Testing/Procedures: Your physician has recommended that you have a right groin pseudoaneurysm doppler in 3 months.   Your physician has requested that you have an echocardiogram. Echocardiography is a painless test that uses sound waves to create images of your heart. It provides your doctor with information about the size and shape of your heart and how well your heart's chambers and valves are working. This procedure takes approximately one hour. There are no restrictions for this procedure. This will be performed at [address]  Follow-Up: Your physician recommends that you schedule a follow-up appointment after doppler is completed.  If you need a refill on your cardiac medications before your next appointment, please call your pharmacy.

## 2016-02-23 NOTE — Assessment & Plan Note (Signed)
Jeff Key has a right common femoral pseudoaneurysm by duplex ultrasound yesterday in our office. Does have resolving ecchymosis and a soft bruit. He is however a symptomatic from this and denies pain. He has a relatively wide neck by ultrasound as well. Because he is a symptomatic we will repeat Doppler him in 3 months. I do not see any pressing need to either compress or inject this at this time.

## 2016-03-01 ENCOUNTER — Ambulatory Visit: Payer: PPO | Admitting: Cardiovascular Disease

## 2016-03-10 ENCOUNTER — Encounter: Payer: Self-pay | Admitting: Emergency Medicine

## 2016-03-10 ENCOUNTER — Emergency Department: Payer: PPO

## 2016-03-10 ENCOUNTER — Inpatient Hospital Stay
Admission: EM | Admit: 2016-03-10 | Discharge: 2016-03-13 | DRG: 871 | Disposition: A | Discharge status: Home / Self Care | Payer: PPO | Attending: Internal Medicine | Admitting: Internal Medicine

## 2016-03-10 DIAGNOSIS — K219 Gastro-esophageal reflux disease without esophagitis: Secondary | ICD-10-CM | POA: Diagnosis not present

## 2016-03-10 DIAGNOSIS — Z955 Presence of coronary angioplasty implant and graft: Secondary | ICD-10-CM

## 2016-03-10 DIAGNOSIS — E222 Syndrome of inappropriate secretion of antidiuretic hormone: Secondary | ICD-10-CM | POA: Diagnosis not present

## 2016-03-10 DIAGNOSIS — G9340 Encephalopathy, unspecified: Secondary | ICD-10-CM | POA: Diagnosis not present

## 2016-03-10 DIAGNOSIS — I2510 Atherosclerotic heart disease of native coronary artery without angina pectoris: Secondary | ICD-10-CM | POA: Diagnosis not present

## 2016-03-10 DIAGNOSIS — Z7982 Long term (current) use of aspirin: Secondary | ICD-10-CM

## 2016-03-10 DIAGNOSIS — I255 Ischemic cardiomyopathy: Secondary | ICD-10-CM | POA: Diagnosis not present

## 2016-03-10 DIAGNOSIS — G9341 Metabolic encephalopathy: Secondary | ICD-10-CM | POA: Diagnosis present

## 2016-03-10 DIAGNOSIS — Z8711 Personal history of peptic ulcer disease: Secondary | ICD-10-CM

## 2016-03-10 DIAGNOSIS — I2119 ST elevation (STEMI) myocardial infarction involving other coronary artery of inferior wall: Secondary | ICD-10-CM | POA: Diagnosis present

## 2016-03-10 DIAGNOSIS — A419 Sepsis, unspecified organism: Secondary | ICD-10-CM | POA: Diagnosis not present

## 2016-03-10 DIAGNOSIS — D649 Anemia, unspecified: Secondary | ICD-10-CM | POA: Diagnosis present

## 2016-03-10 DIAGNOSIS — F0280 Dementia in other diseases classified elsewhere without behavioral disturbance: Secondary | ICD-10-CM | POA: Diagnosis present

## 2016-03-10 DIAGNOSIS — N179 Acute kidney failure, unspecified: Secondary | ICD-10-CM | POA: Diagnosis not present

## 2016-03-10 DIAGNOSIS — I959 Hypotension, unspecified: Secondary | ICD-10-CM

## 2016-03-10 DIAGNOSIS — J09X2 Influenza due to identified novel influenza A virus with other respiratory manifestations: Secondary | ICD-10-CM | POA: Diagnosis not present

## 2016-03-10 DIAGNOSIS — G309 Alzheimer's disease, unspecified: Secondary | ICD-10-CM | POA: Diagnosis present

## 2016-03-10 DIAGNOSIS — Z7902 Long term (current) use of antithrombotics/antiplatelets: Secondary | ICD-10-CM | POA: Diagnosis not present

## 2016-03-10 DIAGNOSIS — Z79899 Other long term (current) drug therapy: Secondary | ICD-10-CM

## 2016-03-10 DIAGNOSIS — I10 Essential (primary) hypertension: Secondary | ICD-10-CM | POA: Diagnosis not present

## 2016-03-10 DIAGNOSIS — B952 Enterococcus as the cause of diseases classified elsewhere: Secondary | ICD-10-CM | POA: Diagnosis present

## 2016-03-10 DIAGNOSIS — E871 Hypo-osmolality and hyponatremia: Secondary | ICD-10-CM

## 2016-03-10 DIAGNOSIS — N390 Urinary tract infection, site not specified: Secondary | ICD-10-CM | POA: Diagnosis not present

## 2016-03-10 DIAGNOSIS — Z87891 Personal history of nicotine dependence: Secondary | ICD-10-CM

## 2016-03-10 DIAGNOSIS — R4182 Altered mental status, unspecified: Secondary | ICD-10-CM | POA: Diagnosis not present

## 2016-03-10 LAB — URINALYSIS, COMPLETE (UACMP) WITH MICROSCOPIC
BILIRUBIN URINE: NEGATIVE
GLUCOSE, UA: NEGATIVE mg/dL
Ketones, ur: NEGATIVE mg/dL
NITRITE: NEGATIVE
PH: 7 (ref 5.0–8.0)
Protein, ur: 30 mg/dL — AB
SPECIFIC GRAVITY, URINE: 1.016 (ref 1.005–1.030)
Squamous Epithelial / LPF: NONE SEEN

## 2016-03-10 LAB — CBC WITH DIFFERENTIAL/PLATELET
BASOS ABS: 0.1 10*3/uL (ref 0–0.1)
BASOS PCT: 1 %
EOS ABS: 0.4 10*3/uL (ref 0–0.7)
EOS PCT: 5 %
HCT: 35.8 % — ABNORMAL LOW (ref 40.0–52.0)
Hemoglobin: 12 g/dL — ABNORMAL LOW (ref 13.0–18.0)
LYMPHS PCT: 10 %
Lymphs Abs: 0.8 10*3/uL — ABNORMAL LOW (ref 1.0–3.6)
MCH: 28.5 pg (ref 26.0–34.0)
MCHC: 33.6 g/dL (ref 32.0–36.0)
MCV: 84.9 fL (ref 80.0–100.0)
MONO ABS: 1.1 10*3/uL — AB (ref 0.2–1.0)
Monocytes Relative: 13 %
Neutro Abs: 5.8 10*3/uL (ref 1.4–6.5)
Neutrophils Relative %: 71 %
PLATELETS: 274 10*3/uL (ref 150–440)
RBC: 4.22 MIL/uL — AB (ref 4.40–5.90)
RDW: 15.8 % — AB (ref 11.5–14.5)
WBC: 8.2 10*3/uL (ref 3.8–10.6)

## 2016-03-10 LAB — COMPREHENSIVE METABOLIC PANEL
ALT: 17 U/L (ref 17–63)
AST: 23 U/L (ref 15–41)
Albumin: 4.1 g/dL (ref 3.5–5.0)
Alkaline Phosphatase: 113 U/L (ref 38–126)
Anion gap: 9 (ref 5–15)
BILIRUBIN TOTAL: 0.8 mg/dL (ref 0.3–1.2)
BUN: 18 mg/dL (ref 6–20)
CHLORIDE: 95 mmol/L — AB (ref 101–111)
CO2: 23 mmol/L (ref 22–32)
CREATININE: 1.25 mg/dL — AB (ref 0.61–1.24)
Calcium: 9.2 mg/dL (ref 8.9–10.3)
GFR, EST NON AFRICAN AMERICAN: 56 mL/min — AB (ref >60)
Glucose, Bld: 89 mg/dL (ref 65–99)
Potassium: 4.4 mmol/L (ref 3.5–5.1)
Sodium: 127 mmol/L — ABNORMAL LOW (ref 135–145)
Total Protein: 7.6 g/dL (ref 6.5–8.1)

## 2016-03-10 LAB — MRSA PCR SCREENING: MRSA BY PCR: NEGATIVE

## 2016-03-10 LAB — INFLUENZA PANEL BY PCR (TYPE A & B)
INFLAPCR: POSITIVE — AB
Influenza B By PCR: NEGATIVE

## 2016-03-10 LAB — TROPONIN I
TROPONIN I: 0.03 ng/mL — AB (ref <0.03)
TROPONIN I: 0.03 ng/mL — AB (ref <0.03)
Troponin I: 0.03 ng/mL (ref <0.03)

## 2016-03-10 LAB — TSH: TSH: 1.552 u[IU]/mL (ref 0.350–4.500)

## 2016-03-10 LAB — LACTIC ACID, PLASMA: LACTIC ACID, VENOUS: 1 mmol/L (ref 0.5–1.9)

## 2016-03-10 MED ORDER — VITAMIN B-12 1000 MCG PO TABS
1000.0000 ug | ORAL_TABLET | Freq: Every day | ORAL | Status: DC
  Administered 2016-03-11 – 2016-03-12 (×2): 1000 ug via ORAL
  Filled 2016-03-10 (×2): qty 1

## 2016-03-10 MED ORDER — ONDANSETRON HCL 4 MG PO TABS: 4.0000 mg | ORAL_TABLET | Freq: Four times a day (QID) | ORAL | Status: DC | PRN

## 2016-03-10 MED ORDER — VANCOMYCIN HCL 10 G IV SOLR
1250.0000 mg | INTRAVENOUS | Status: DC
  Administered 2016-03-10: 1250 mg via INTRAVENOUS
  Filled 2016-03-10: qty 1250

## 2016-03-10 MED ORDER — SODIUM CHLORIDE 0.9% FLUSH
3.0000 mL | Freq: Two times a day (BID) | INTRAVENOUS | Status: DC
  Administered 2016-03-10 – 2016-03-12 (×6): 3 mL via INTRAVENOUS

## 2016-03-10 MED ORDER — PANTOPRAZOLE SODIUM 40 MG PO TBEC
40.0000 mg | DELAYED_RELEASE_TABLET | Freq: Every day | ORAL | Status: DC
  Administered 2016-03-11 – 2016-03-12 (×2): 40 mg via ORAL
  Filled 2016-03-10 (×2): qty 1

## 2016-03-10 MED ORDER — ONDANSETRON HCL 4 MG/2ML IJ SOLN: 4.0000 mg | Freq: Four times a day (QID) | INTRAMUSCULAR | Status: DC | PRN

## 2016-03-10 MED ORDER — LISINOPRIL 10 MG PO TABS
10.0000 mg | ORAL_TABLET | Freq: Every day | ORAL | Status: DC
  Administered 2016-03-11 – 2016-03-12 (×2): 10 mg via ORAL
  Filled 2016-03-10 (×3): qty 1

## 2016-03-10 MED ORDER — NITROGLYCERIN 0.4 MG SL SUBL: 0.4000 mg | SUBLINGUAL_TABLET | SUBLINGUAL | Status: DC | PRN

## 2016-03-10 MED ORDER — SODIUM CHLORIDE 0.9 % IV SOLN
INTRAVENOUS | Status: DC
  Administered 2016-03-10 – 2016-03-11 (×2): via INTRAVENOUS

## 2016-03-10 MED ORDER — VANCOMYCIN HCL IN DEXTROSE 1-5 GM/200ML-% IV SOLN
1000.0000 mg | Freq: Once | INTRAVENOUS | Status: AC
  Administered 2016-03-10: 1000 mg via INTRAVENOUS
  Filled 2016-03-10: qty 200

## 2016-03-10 MED ORDER — CEFEPIME-DEXTROSE 2 GM/50ML IV SOLR
2.0000 g | Freq: Once | INTRAVENOUS | Status: AC
  Administered 2016-03-10: 2 g via INTRAVENOUS
  Filled 2016-03-10: qty 50

## 2016-03-10 MED ORDER — ATORVASTATIN CALCIUM 20 MG PO TABS
80.0000 mg | ORAL_TABLET | Freq: Every day | ORAL | Status: DC
  Administered 2016-03-10 – 2016-03-12 (×3): 80 mg via ORAL
  Filled 2016-03-10 (×3): qty 4

## 2016-03-10 MED ORDER — FERROUS SULFATE 325 (65 FE) MG PO TABS
325.0000 mg | ORAL_TABLET | Freq: Every day | ORAL | Status: DC
  Administered 2016-03-11 – 2016-03-13 (×3): 325 mg via ORAL
  Filled 2016-03-10 (×3): qty 1

## 2016-03-10 MED ORDER — SULFASALAZINE 500 MG PO TBEC
1000.0000 mg | DELAYED_RELEASE_TABLET | Freq: Two times a day (BID) | ORAL | Status: DC
  Administered 2016-03-10 – 2016-03-12 (×5): 1000 mg via ORAL
  Filled 2016-03-10 (×7): qty 2

## 2016-03-10 MED ORDER — SODIUM CHLORIDE 0.9 % IV BOLUS (SEPSIS)
500.0000 mL | Freq: Once | INTRAVENOUS | Status: AC
  Administered 2016-03-10: 500 mL via INTRAVENOUS

## 2016-03-10 MED ORDER — METOPROLOL TARTRATE 25 MG PO TABS
25.0000 mg | ORAL_TABLET | Freq: Two times a day (BID) | ORAL | Status: DC
  Administered 2016-03-10 – 2016-03-12 (×3): 25 mg via ORAL
  Filled 2016-03-10 (×5): qty 1

## 2016-03-10 MED ORDER — ASPIRIN EC 81 MG PO TBEC
81.0000 mg | DELAYED_RELEASE_TABLET | Freq: Every day | ORAL | Status: DC
  Administered 2016-03-11 – 2016-03-12 (×2): 81 mg via ORAL
  Filled 2016-03-10 (×2): qty 1

## 2016-03-10 MED ORDER — SODIUM CHLORIDE 0.9 % IV BOLUS (SEPSIS)
1000.0000 mL | Freq: Once | INTRAVENOUS | Status: AC
  Administered 2016-03-10: 1000 mL via INTRAVENOUS

## 2016-03-10 MED ORDER — CEFEPIME-DEXTROSE 2 GM/50ML IV SOLR
2.0000 g | Freq: Two times a day (BID) | INTRAVENOUS | Status: DC
Start: 2016-03-10 — End: 2016-03-11
  Administered 2016-03-11 (×2): 2 g via INTRAVENOUS
  Filled 2016-03-10 (×3): qty 50

## 2016-03-10 MED ORDER — SENNOSIDES-DOCUSATE SODIUM 8.6-50 MG PO TABS: 1.0000 | ORAL_TABLET | Freq: Every evening | ORAL | Status: DC | PRN

## 2016-03-10 MED ORDER — AMIODARONE HCL 200 MG PO TABS
200.0000 mg | ORAL_TABLET | Freq: Every day | ORAL | Status: DC
  Administered 2016-03-11 – 2016-03-12 (×2): 200 mg via ORAL
  Filled 2016-03-10 (×2): qty 1

## 2016-03-10 MED ORDER — ACETAMINOPHEN 325 MG PO TABS
650.0000 mg | ORAL_TABLET | Freq: Four times a day (QID) | ORAL | Status: DC | PRN
  Administered 2016-03-11: 650 mg via ORAL
  Filled 2016-03-10: qty 2

## 2016-03-10 MED ORDER — GALANTAMINE HYDROBROMIDE 4 MG PO TABS
4.0000 mg | ORAL_TABLET | Freq: Two times a day (BID) | ORAL | Status: DC
  Administered 2016-03-10 – 2016-03-13 (×6): 4 mg via ORAL
  Filled 2016-03-10 (×7): qty 1

## 2016-03-10 MED ORDER — ACETAMINOPHEN 650 MG RE SUPP: 650.0000 mg | Freq: Four times a day (QID) | RECTAL | Status: DC | PRN

## 2016-03-10 MED ORDER — ENOXAPARIN SODIUM 40 MG/0.4ML ~~LOC~~ SOLN
40.0000 mg | SUBCUTANEOUS | Status: DC
  Administered 2016-03-10 – 2016-03-12 (×3): 40 mg via SUBCUTANEOUS
  Filled 2016-03-10 (×3): qty 0.4

## 2016-03-10 MED ORDER — TICAGRELOR 90 MG PO TABS
90.0000 mg | ORAL_TABLET | Freq: Two times a day (BID) | ORAL | Status: DC
  Administered 2016-03-10 – 2016-03-12 (×5): 90 mg via ORAL
  Filled 2016-03-10 (×5): qty 1

## 2016-03-10 NOTE — ED Notes (Signed)
Code  Sepsis  Called to  [facility] 

## 2016-03-10 NOTE — ED Notes (Signed)
Dr. Robinson notified of critical trop 

## 2016-03-10 NOTE — ED Provider Notes (Signed)
[facility] Emergency Department Provider Note    First MD Initiated Contact with Patient 03/10/16 1259     (approximate)  I have reviewed the triage vital signs and the nursing notes.   HISTORY  Chief Complaint Altered Mental Status  Level V Caveat: Dementia  HPI Jeff Key is a 70 y.o. male presents with fevers and altered mental status that started last night. Patient has recently been diagnosed with Alzheimer's dementia therefore limiting his history. Patient arrives with family members who state the patient became more confused yesterday evening with temperature measured 201. Patient had persisting decline in his mental status and they brought him to the ER. He is also status post PCI in November. The wife states that he has not been complaining of any chest pain or shortness of breath. Has not been on any antibiotics but obviously had been admitted to the hospital.   Past Medical History:  Diagnosis Date  . Anemia ~ 11/2015  . CAD (coronary artery disease)    a. inf STEMI on 02/10/16. RCA treated with DES followed by staged PCI of LAD and Circumflex 02/14/16.  Marland Kitchen. Dementia dx'd 01/2016  . GERD (gastroesophageal reflux disease)   . History of stomach ulcers 1980s?  . Ischemic cardiomyopathy    a. 01/2016: EF 40-45%.    Family History  Problem Relation Age of Onset  . Prostate cancer Father   . Bladder Cancer Neg Hx   . Kidney cancer Neg Hx    Past Surgical History:  Procedure Laterality Date  . CARDIAC CATHETERIZATION N/A 02/10/2016   Procedure: Left Heart Cath and Coronary Angiography;  Surgeon: Runell GessJonathan J Berry, MD;  [facility];  Service: Cardiovascular;  Laterality: N/A;  . CARDIAC CATHETERIZATION N/A 02/10/2016   Procedure: Coronary Stent Intervention;  Surgeon: Runell GessJonathan J Berry, MD;  Location: [facility] INVASIVE CV LAB;  Service: Cardiovascular;  Laterality: N/A;  . CARDIAC CATHETERIZATION N/A 02/14/2016   Procedure:  Coronary Stent Intervention-LAD and CFX;  Surgeon: Runell GessJonathan J Berry, MD;  Location: [facility] INVASIVE CV LAB;  Service: Cardiovascular;  Laterality: N/A;  . COLONOSCOPY WITH PROPOFOL N/A 05/06/2015   Procedure: COLONOSCOPY WITH PROPOFOL;  Surgeon: Wallace CullensPaul Y Oh, MD;  [facility];  Service: Gastroenterology;  Laterality: N/A;  . CORONARY ANGIOPLASTY WITH STENT PLACEMENT  02/14/2016   "2 stents"  . ESOPHAGOGASTRODUODENOSCOPY (EGD) WITH PROPOFOL N/A 05/06/2015   Procedure: ESOPHAGOGASTRODUODENOSCOPY (EGD) WITH PROPOFOL;  Surgeon: Wallace CullensPaul Y Oh, MD;  [facility];  Service: Gastroenterology;  Laterality: N/A;  . FOREARM FRACTURE SURGERY Right 1985   "got it tore up in [place]"  . FRACTURE SURGERY     Patient Active Problem List   Diagnosis Date Noted  . 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 NSVT (nonsustained ventricular tachycardia) (HCC) 02/15/2016  . CAD (coronary artery disease)   . Ischemic cardiomyopathy   . STEMI involving oth coronary artery of inferior wall (HCC) 02/10/2016  . Abnormal urinalysis 10/16/2014      Prior to Admission medications   Medication Sig Start Date End Date Taking? Authorizing Provider  amiodarone (PACERONE) 200 MG tablet Take 1 tablet (200 mg total) by mouth daily. 02/22/16   Janetta HoraKathryn R Thompson, PA-C  aspirin EC 81 MG tablet Take 81 mg by mouth daily.    Historical Provider, MD  atorvastatin (LIPITOR) 80 MG tablet Take 1 tablet (80 mg total) by mouth daily at 6 PM. 02/15/16   Janetta HoraKathryn R Thompson, PA-C  ferrous  sulfate 325 (65 FE) MG tablet Take 325 mg by mouth daily with breakfast.    Historical Provider, MD  galantamine (RAZADYNE) 4 MG tablet Take 4 mg by mouth daily. 01/31/16 01/30/17  Historical Provider, MD  lisinopril (PRINIVIL,ZESTRIL) 10 MG tablet Take 1 tablet (10 mg total) by mouth daily. 02/15/16   Janetta HoraKathryn R Thompson, PA-C  metoprolol tartrate (LOPRESSOR) 25 MG tablet Take 1 tablet (25 mg total) by mouth 2 (two) times  daily. 02/15/16   Janetta HoraKathryn R Thompson, PA-C  nitroGLYCERIN (NITROSTAT) 0.4 MG SL tablet Place 1 tablet (0.4 mg total) under the tongue every 5 (five) minutes as needed for chest pain. 02/15/16   Janetta HoraKathryn R Thompson, PA-C  omeprazole (PRILOSEC) 20 MG capsule Take 20 mg by mouth daily.    Historical Provider, MD  sulfaSALAzine (AZULFIDINE) 500 MG EC tablet Take 1,000 mg by mouth 2 (two) times daily.    Historical Provider, MD  ticagrelor (BRILINTA) 90 MG TABS tablet Take 1 tablet (90 mg total) by mouth 2 (two) times daily. 02/15/16   Janetta HoraKathryn R Thompson, PA-C  vitamin B-12 (CYANOCOBALAMIN) 1000 MCG tablet Take 1,000 mcg by mouth daily.    Historical Provider, MD    Allergies Patient has no known allergies.    Social History Social History  Substance Use Topics  . Smoking status: Former Smoker    Packs/day: 2.00    Years: 30.00    Types: Cigarettes    Quit date: 221990  . Smokeless tobacco: Never Used  . Alcohol use No    Review of Systems Patient denies headaches, rhinorrhea, blurry vision, numbness, shortness of breath, chest pain, edema, cough, abdominal pain, nausea, vomiting, diarrhea, dysuria, fevers, rashes or hallucinations unless otherwise stated above in HPI. ____________________________________________   PHYSICAL EXAM:  VITAL SIGNS: Vitals:   03/10/16 1254  BP: (!) 81/48  Pulse: 66  Resp: 17  Temp: 98.9 F (37.2 C)    Constitutional: Alert ill appearing but in no acute distress. Eyes: Conjunctivae are normal. PERRL. EOMI. Head: Atraumatic. Nose: No congestion/rhinnorhea. Mouth/Throat: Mucous membranes are dry.  Oropharynx non-erythematous. Neck: No stridor. Painless ROM. No cervical spine tenderness to palpation Hematological/Lymphatic/Immunilogical: No cervical lymphadenopathy. Cardiovascular: Normal rate, regular rhythm. Grossly normal heart sounds.  Good peripheral circulation. Respiratory: Normal respiratory effort.  No retractions. Lungs with coarse bibasilar  breath sounds and right lung field rales Gastrointestinal: Soft and nontender. No distention. No abdominal bruits. No CVA tenderness. Genitourinary:  Musculoskeletal: No lower extremity tenderness nor edema.  No joint effusions. Neurologic:  Normal speech, confused to place and time No gross focal neurologic deficits are appreciated.  Skin:  Skin is warm, dry and intact. No rash noted.   ____________________________________________   LABS (all labs ordered are listed, but only abnormal results are displayed)  Results for orders placed or performed during the hospital encounter of 03/10/16 (from the past 24 hour(s))  Lactic acid, plasma     Status: None   Collection Time: 03/10/16  1:16 PM  Result Value Ref Range   Lactic Acid, Venous 1.0 0.5 - 1.9 mmol/L  Comprehensive metabolic panel     Status: Abnormal   Collection Time: 03/10/16  1:16 PM  Result Value Ref Range   Sodium 127 (L) 135 - 145 mmol/L   Potassium 4.4 3.5 - 5.1 mmol/L   Chloride 95 (L) 101 - 111 mmol/L   CO2 23 22 - 32 mmol/L   Glucose, Bld 89 65 - 99 mg/dL   BUN 18 6 - 20 mg/dL  Creatinine, Ser 1.25 (H) 0.61 - 1.24 mg/dL   Calcium 9.2 8.9 - 16.1 mg/dL   Total Protein 7.6 6.5 - 8.1 g/dL   Albumin 4.1 3.5 - 5.0 g/dL   AST 23 15 - 41 U/L   ALT 17 17 - 63 U/L   Alkaline Phosphatase 113 38 - 126 U/L   Total Bilirubin 0.8 0.3 - 1.2 mg/dL   GFR calc non Af Amer 56 (L) >60 mL/min   GFR calc Af Amer >60 >60 mL/min   Anion gap 9 5 - 15  Troponin I     Status: Abnormal   Collection Time: 03/10/16  1:16 PM  Result Value Ref Range   Troponin I 0.03 (HH) <0.03 ng/mL  CBC WITH DIFFERENTIAL     Status: Abnormal   Collection Time: 03/10/16  1:16 PM  Result Value Ref Range   WBC 8.2 3.8 - 10.6 K/uL   RBC 4.22 (L) 4.40 - 5.90 MIL/uL   Hemoglobin 12.0 (L) 13.0 - 18.0 g/dL   HCT 09.6 (L) 04.5 - 40.9 %   MCV 84.9 80.0 - 100.0 fL   MCH 28.5 26.0 - 34.0 pg   MCHC 33.6 32.0 - 36.0 g/dL   RDW 81.1 (H) 91.4 - 78.2 %    Platelets 274 150 - 440 K/uL   Neutrophils Relative % 71 %   Neutro Abs 5.8 1.4 - 6.5 K/uL   Lymphocytes Relative 10 %   Lymphs Abs 0.8 (L) 1.0 - 3.6 K/uL   Monocytes Relative 13 %   Monocytes Absolute 1.1 (H) 0.2 - 1.0 K/uL   Eosinophils Relative 5 %   Eosinophils Absolute 0.4 0 - 0.7 K/uL   Basophils Relative 1 %   Basophils Absolute 0.1 0 - 0.1 K/uL   ____________________________________________  EKG My review and personal interpretation at Time: 13:02   Indication: ams  Rate: 65  Rhythm: sinus Axis: normal Other: non specific t wave changes, no STEMI, normal intervals ____________________________________________  RADIOLOGY  I personally reviewed all radiographic images ordered to evaluate for the above acute complaints and reviewed radiology reports and findings.  These findings were personally discussed with the patient.  Please see medical record for radiology report. ____________________________________________   PROCEDURES  Procedure(s) performed:  Procedures    Critical Care performed: yes CRITICAL CARE Performed by: Willy Eddy   Total critical care time: 40 minutes  Critical care time was exclusive of separately billable procedures and treating other patients.  Critical care was necessary to treat or prevent imminent or life-threatening deterioration.  Critical care was time spent personally by me on the following activities: development of treatment plan with patient and/or surrogate as well as nursing, discussions with consultants, evaluation of patient's response to treatment, examination of patient, obtaining history from patient or surrogate, ordering and performing treatments and interventions, ordering and review of laboratory studies, ordering and review of radiographic studies, pulse oximetry and re-evaluation of patient's condition.  ____________________________________________   INITIAL IMPRESSION / ASSESSMENT AND PLAN / ED  COURSE  Pertinent labs & imaging results that were available during my care of the patient were reviewed by me and considered in my medical decision making (see chart for details).  DDX: Dehydration, sepsis, pna, uti, hypoglycemia, cva, drug effect, withdrawal, encephalitis   DMONTE MAHER is a 70 y.o. who presents to the ED with altered mental status hypotension and fever at home.  Patient arrives hypotensive but afebrile. Patient is encephalopathic. Does have underlying dementia but per family is  more altered than usual. Based on his recent hospitalization I am concerned for sepsis possible healthcare associated pneumonia. EKG shows no evidence of acute ischemia and the patient's denying any chest pain or shortness of breath at this time therefore feel MIs less likely. Blood pressure is improving with IV bolus started upon arrival to the ER. No report of head trauma. Clinical exam is not consistent with meningitis or encephalitis.  The patient will be placed on continuous pulse oximetry and telemetry for monitoring.  Laboratory evaluation will be sent to evaluate for the above complaints.     Clinical Course as of Mar 10 1429  Fri Mar 10, 2016  1425 Patient without leukocytosis here in ED but with acute hyponatremia.  BP improving with NS.  Family confirms that the patient was febrile to 101 at home x2 there for will treat empirically for sepsis with iv abx.  I spoke with Dr. Juliene Pina who agrees to admit patient for further evaluation and management. Have discussed with the patient and available family all diagnostics and treatments performed thus far and all questions were answered to the best of my ability. The patient demonstrates understanding and agreement with plan.   [PR]    Clinical Course User Index [PR] Willy Eddy, MD     ____________________________________________   FINAL CLINICAL IMPRESSION(S) / ED DIAGNOSES  Final diagnoses:  Sepsis, due to unspecified organism (HCC)   Hyponatremia  Hypotension, unspecified hypotension type  Acute encephalopathy      NEW MEDICATIONS STARTED DURING THIS VISIT:  New Prescriptions   No medications on file     Note:  This document was prepared using Dragon voice recognition software and may include unintentional dictation errors.    Willy Eddy, MD 03/10/16 [phone]

## 2016-03-10 NOTE — ED Triage Notes (Signed)
Patient arrives to ED via POV with c/o altered mental status. Patient unable to say why he is here. Family states patient has been confused. A&O x1, only to self.

## 2016-03-10 NOTE — H&P (Signed)
[facility] [place] [facility]   PATIENT NAME: Jeff HarmanRoger Payson    MR#:  [id number]  DATE OF BIRTH:  07/06/1945  DATE OF ADMISSION:  03/10/2016  PRIMARY CARE PHYSICIAN: Barbette ReichmannHANDE,VISHWANATH, MD   REQUESTING/REFERRING PHYSICIAN: Dr Roxan Hockeyrobinson  CHIEF COMPLAINT:   fever HISTORY OF PRESENT ILLNESS:  Jeff Key  is a 70 y.o. male with a known history of Dementia and recent STEMI status post RCA stent and PCI of LAD and circumflex who presents with above complaint. Family reports that since last night he has had fevers up to 101. He has also had declined and his mental status from his baseline dementia. Patient has had no chills, body aches, nausea, vomiting, abdominal pain or urinary symptoms. He denies shortness of breath. There are no sick contacts.  Chest x-ray shows no infiltrate. UA is pending Patient's systolic blood pressure was 88 on arrival to ED but has responded well to IV fluids.    PAST MEDICAL HISTORY:   Past Medical History:  Diagnosis Date  . Anemia ~ 11/2015  . CAD (coronary artery disease)    a. inf STEMI on 02/10/16. RCA treated with DES followed by staged PCI of LAD and Circumflex 02/14/16.  Marland Kitchen. Dementia dx'd 01/2016  . GERD (gastroesophageal reflux disease)   . History of stomach ulcers 1980s?  . Ischemic cardiomyopathy    a. 01/2016: EF 40-45%.     PAST SURGICAL HISTORY:   Past Surgical History:  Procedure Laterality Date  . CARDIAC CATHETERIZATION N/A 02/10/2016   Procedure: Left Heart Cath and Coronary Angiography;  Surgeon: Runell GessJonathan J Berry, MD;  [facility];  Service: Cardiovascular;  Laterality: N/A;  . CARDIAC CATHETERIZATION N/A 02/10/2016   Procedure: Coronary Stent Intervention;  Surgeon: Runell GessJonathan J Berry, MD;  Location: [facility] INVASIVE CV LAB;  Service: Cardiovascular;  Laterality: N/A;  . CARDIAC CATHETERIZATION N/A 02/14/2016   Procedure: Coronary Stent Intervention-LAD and CFX;  Surgeon: Runell GessJonathan J Berry, MD;   Location: [facility] INVASIVE CV LAB;  Service: Cardiovascular;  Laterality: N/A;  . COLONOSCOPY WITH PROPOFOL N/A 05/06/2015   Procedure: COLONOSCOPY WITH PROPOFOL;  Surgeon: Wallace CullensPaul Y Oh, MD;  [facility];  Service: Gastroenterology;  Laterality: N/A;  . CORONARY ANGIOPLASTY WITH STENT PLACEMENT  02/14/2016   "2 stents"  . ESOPHAGOGASTRODUODENOSCOPY (EGD) WITH PROPOFOL N/A 05/06/2015   Procedure: ESOPHAGOGASTRODUODENOSCOPY (EGD) WITH PROPOFOL;  Surgeon: Wallace CullensPaul Y Oh, MD;  [facility];  Service: Gastroenterology;  Laterality: N/A;  . FOREARM FRACTURE SURGERY Right 1985   "got it tore up in [place]"  . FRACTURE SURGERY      SOCIAL HISTORY:   Social History  Substance Use Topics  . Smoking status: Former Smoker    Packs/day: 2.00    Years: 30.00    Types: Cigarettes    Quit date: 271990  . Smokeless tobacco: Never Used  . Alcohol use No    FAMILY HISTORY:   Family History  Problem Relation Age of Onset  . Prostate cancer Father   . Bladder Cancer Neg Hx   . Kidney cancer Neg Hx     DRUG ALLERGIES:  No Known Allergies  REVIEW OF SYSTEMS:   Review of Systems  Constitutional: Positive for fever and malaise/fatigue. Negative for chills.  HENT: Negative.  Negative for ear discharge, ear pain, hearing loss, nosebleeds and sore throat.   Eyes: Negative.  Negative for blurred vision and pain.  Respiratory: Positive for cough. Negative for hemoptysis, shortness of breath and wheezing.  Cardiovascular: Negative.  Negative for chest pain, palpitations and leg swelling.  Gastrointestinal: Negative.  Negative for abdominal pain, blood in stool, diarrhea, nausea and vomiting.  Genitourinary: Negative.  Negative for dysuria.  Musculoskeletal: Negative.  Negative for back pain.  Skin: Negative.   Neurological: Positive for weakness. Negative for dizziness, tremors, speech change, focal weakness, seizures and headaches.  Endo/Heme/Allergies: Negative.  Does not bruise/bleed  easily.  Psychiatric/Behavioral: Positive for memory loss. Negative for depression, hallucinations and suicidal ideas.    MEDICATIONS AT HOME:   Prior to Admission medications   Medication Sig Start Date End Date Taking? Authorizing Provider  amiodarone (PACERONE) 200 MG tablet Take 1 tablet (200 mg total) by mouth daily. 02/22/16  Yes Janetta Hora, PA-C  aspirin EC 81 MG tablet Take 81 mg by mouth daily.   Yes Historical Provider, MD  atorvastatin (LIPITOR) 80 MG tablet Take 1 tablet (80 mg total) by mouth daily at 6 PM. 02/15/16  Yes Janetta Hora, PA-C  ferrous sulfate 325 (65 FE) MG tablet Take 325 mg by mouth daily with breakfast.   Yes Historical Provider, MD  galantamine (RAZADYNE) 4 MG tablet Take 4 mg by mouth 2 (two) times daily with a meal.  01/31/16 01/30/17 Yes Historical Provider, MD  lisinopril (PRINIVIL,ZESTRIL) 10 MG tablet Take 1 tablet (10 mg total) by mouth daily. 02/15/16  Yes Janetta Hora, PA-C  metoprolol tartrate (LOPRESSOR) 25 MG tablet Take 1 tablet (25 mg total) by mouth 2 (two) times daily. 02/15/16  Yes Janetta Hora, PA-C  omeprazole (PRILOSEC) 20 MG capsule Take 20 mg by mouth daily.   Yes Historical Provider, MD  sulfaSALAzine (AZULFIDINE) 500 MG EC tablet Take 1,000 mg by mouth 2 (two) times daily.   Yes Historical Provider, MD  vitamin B-12 (CYANOCOBALAMIN) 1000 MCG tablet Take 1,000 mcg by mouth daily.   Yes Historical Provider, MD  nitroGLYCERIN (NITROSTAT) 0.4 MG SL tablet Place 1 tablet (0.4 mg total) under the tongue every 5 (five) minutes as needed for chest pain. 02/15/16   Janetta Hora, PA-C  ticagrelor (BRILINTA) 90 MG TABS tablet Take 1 tablet (90 mg total) by mouth 2 (two) times daily. Patient not taking: Reported on 03/10/2016 02/15/16   Janetta Hora, PA-C      VITAL SIGNS:  Blood pressure (!) 81/48, pulse 66, temperature 98.9 F (37.2 C), resp. rate 17, height 6' (1.829 m), weight 81.6 kg (180 lb), SpO2 97  %.  PHYSICAL EXAMINATION:   Physical Exam  Constitutional: He is well-developed, well-nourished, and in no distress. No distress.  HENT:  Head: Normocephalic.  Eyes: No scleral icterus.  Neck: Normal range of motion. Neck supple. No JVD present. No tracheal deviation present.  Cardiovascular: Normal rate, regular rhythm and normal heart sounds.  Exam reveals no gallop and no friction rub.   No murmur heard. Pulmonary/Chest: Effort normal and breath sounds normal. No respiratory distress. He has no wheezes. He has no rales. He exhibits no tenderness.  Abdominal: Soft. Bowel sounds are normal. He exhibits no distension and no mass. There is no tenderness. There is no rebound and no guarding.  Musculoskeletal: Normal range of motion. He exhibits no edema.  Neurological: He is alert.  Oriented to name  Skin: Skin is warm. No rash noted. No erythema.      LABORATORY PANEL:   CBC  Recent Labs Lab 03/10/16 1316  WBC 8.2  HGB 12.0*  HCT 35.8*  PLT 274   ------------------------------------------------------------------------------------------------------------------  Chemistries   Recent Labs Lab 03/10/16 1316  NA 127*  K 4.4  CL 95*  CO2 23  GLUCOSE 89  BUN 18  CREATININE 1.25*  CALCIUM 9.2  AST 23  ALT 17  ALKPHOS 113  BILITOT 0.8   ------------------------------------------------------------------------------------------------------------------  Cardiac Enzymes  Recent Labs Lab 03/10/16 1316  TROPONINI 0.03*   ------------------------------------------------------------------------------------------------------------------  RADIOLOGY:  Dg Chest Port 1 View  Result Date: 03/10/2016 CLINICAL DATA:  Sepsis question pneumonia EXAM: PORTABLE CHEST 1 VIEW COMPARISON:  Portable exam 1329 hours compared to 11/28/2015 FINDINGS: Borderline enlargement of cardiac silhouette. Mediastinal contours and pulmonary vascularity normal. Minimal atherosclerotic  calcification aortic arch. Lungs clear. No pleural effusion or pneumothorax. Bones demineralized with degenerative changes at BILATERAL AC joints. IMPRESSION: No acute abnormalities. Electronically Signed   By: Ulyses SouthwardMark  Boles M.D.   On: 03/10/2016 13:48    EKG:  Sinus rhythm and old Q waves in inferior leads   IMPRESSION AND PLAN:   70 year old male with recent STEMI who presents with sepsis.   1. Sepsis with fever at home and hypotension: Continue broad-spectrum antibiotics Check UA Follow-up on final blood cultures and urine culture Continue low-dose IV fluids  2. Acute encephalopathy, metabolic: Likely due to hyponatremia and sepsis Check TSH Repeat BMP in a.m. after IV fluids and IV antibiotics  3. Acute kidney injury: This is likely due to prerenal azotemia. And tinea IV fluids and repeat BMP in a.m.  4. Recent STEMI status post drug-eluting stent to RCA, LAD and circumflex: Continue aspirin, atorvastatin, lisinopril, metoprolol,Brilanta Follow troponins 5. Ischemic cardio myopathy EF of 40-45%: Continue lisinopril and metoprolol Watch for signs of fluid overload  6. 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 Dementia: Continue galantamine      All the records are reviewed and case discussed with ED provider. Management plans discussed with the patient's family and they are in agreement  CODE STATUS: full  TOTAL TIME TAKING CARE OF THIS PATIENT: 45 minutes.    Shontay Wallner M.D on 03/10/2016 at 2:39 PM  Between 7am to 6pm - Pager - [phone]  After 6pm go to [email or web address] - Social research officer, governmentpassword [email or web address] [email or web address]  [facility]  Office  [phone]  CC: Primary care physician; Barbette ReichmannHANDE,VISHWANATH, MD

## 2016-03-10 NOTE — ED Notes (Signed)
EDP at bedside  

## 2016-03-10 NOTE — Progress Notes (Signed)
Pharmacy Antibiotic Note  Jeff DallasRoger H Zunker is a 70 y.o. male admitted on 03/10/2016 with sepsis.  Pharmacy has been consulted for vancomycin and cefepime dosing.  Plan: Cefepime 2 g IV q12h  Vancomycin 1000 mg dose given in ED. Will order vancomycin 1250 mg IV q18h (6 hour stacked dose) Goal vancomycin trough 15-20 mcg/mL Vancomycin trough 12/25 @ 0230  Kinetics: Using actual body weight = 81 kg Ke: 0.057 Half-life: 12 hrs Vd: 56 L Cmin = 15 mcg/mL  Height: 6' (182.9 cm) Weight: 180 lb (81.6 kg) IBW/kg (Calculated) : 77.6  Temp (24hrs), Avg:98.9 F (37.2 C), Min:98.9 F (37.2 C), Max:98.9 F (37.2 C)   Recent Labs Lab 03/10/16 1316  WBC 8.2  CREATININE 1.25*  LATICACIDVEN 1.0    Estimated Creatinine Clearance: 59.5 mL/min (by C-G formula based on SCr of 1.25 mg/dL (H)).    No Known Allergies  Antimicrobials this admission: cefepime 12/22 >>  vancomycin 12/22 >>   Dose adjustments this admission:  Microbiology results: 12/22 BCx: Sent 12/22 UCx: Sent  12/22 MRSA PCR: Sent  Thank you for allowing pharmacy to be a part of this patient's care.  Jodelle RedMary M [facility] 03/10/2016 3:02 PM

## 2016-03-10 NOTE — Progress Notes (Signed)
Family Meeting Note  Advance Directive:no  Today a meeting took place with the wife and daughter  Patient is unable to participate due WG:[id number]:Lacked capacity dementia   The following clinical team members were present during this meeting:MD  The following were discussed:Patient's diagnosis: hyponatremia Sepsis hypontesion , Patient's progosis: Unable to determine and Goals for treatment: Full Code  Additional follow-up to be provided: family will work on advance directives, living will, durable POA after Christmas  Time spent during discussion:3016minutes  Dr Adrian SaranSital Zeshan Sena

## 2016-03-11 LAB — BASIC METABOLIC PANEL
ANION GAP: 6 (ref 5–15)
ANION GAP: 9 (ref 5–15)
BUN: 12 mg/dL (ref 6–20)
BUN: 11 mg/dL (ref 6–20)
CHLORIDE: 97 mmol/L — AB (ref 101–111)
CO2: 22 mmol/L (ref 22–32)
CO2: 22 mmol/L (ref 22–32)
CREATININE: 0.95 mg/dL (ref 0.61–1.24)
Calcium: 7.8 mg/dL — ABNORMAL LOW (ref 8.9–10.3)
Calcium: 7.9 mg/dL — ABNORMAL LOW (ref 8.9–10.3)
Chloride: 93 mmol/L — ABNORMAL LOW (ref 101–111)
Creatinine, Ser: 0.91 mg/dL (ref 0.61–1.24)
GFR calc non Af Amer: >60 mL/min (ref >60)
Glucose, Bld: 98 mg/dL (ref 65–99)
Glucose, Bld: 100 mg/dL — ABNORMAL HIGH (ref 65–99)
POTASSIUM: 3.6 mmol/L (ref 3.5–5.1)
Potassium: 4 mmol/L (ref 3.5–5.1)
SODIUM: 125 mmol/L — AB (ref 135–145)
SODIUM: 124 mmol/L — AB (ref 135–145)

## 2016-03-11 LAB — TROPONIN I: Troponin I: 0.03 ng/mL (ref <0.03)

## 2016-03-11 LAB — SODIUM
SODIUM: 123 mmol/L — AB (ref 135–145)
SODIUM: 129 mmol/L — AB (ref 135–145)
Sodium: 123 mmol/L — ABNORMAL LOW (ref 135–145)
Sodium: 128 mmol/L — ABNORMAL LOW (ref 135–145)

## 2016-03-11 LAB — URIC ACID: URIC ACID, SERUM: 2.1 mg/dL — AB (ref 4.4–7.6)

## 2016-03-11 LAB — SODIUM, URINE, RANDOM: Sodium, Ur: 151 mmol/L

## 2016-03-11 LAB — OSMOLALITY, URINE: Osmolality, Ur: 539 mOsm/kg (ref 300–900)

## 2016-03-11 LAB — CORTISOL: Cortisol, Plasma: 10.1 ug/dL

## 2016-03-11 LAB — OSMOLALITY: Osmolality: 254 mOsm/kg — ABNORMAL LOW (ref 275–295)

## 2016-03-11 MED ORDER — OSELTAMIVIR PHOSPHATE 75 MG PO CAPS
75.0000 mg | ORAL_CAPSULE | Freq: Two times a day (BID) | ORAL | Status: DC
  Administered 2016-03-11 – 2016-03-12 (×5): 75 mg via ORAL
  Filled 2016-03-11 (×5): qty 1

## 2016-03-11 MED ORDER — TOLVAPTAN 15 MG PO TABS
15.0000 mg | ORAL_TABLET | ORAL | Status: DC
  Administered 2016-03-11: 15 mg via ORAL
  Filled 2016-03-11 (×2): qty 1

## 2016-03-11 MED ORDER — CEFTRIAXONE SODIUM-DEXTROSE 1-3.74 GM-% IV SOLR
1.0000 g | INTRAVENOUS | Status: DC
  Administered 2016-03-11: 1 g via INTRAVENOUS
  Filled 2016-03-11 (×2): qty 50

## 2016-03-11 MED ORDER — PHENOL 1.4 % MT LIQD
2.0000 | Freq: Four times a day (QID) | OROMUCOSAL | Status: DC | PRN
  Administered 2016-03-11: 2 via OROMUCOSAL
  Filled 2016-03-11: qty 177

## 2016-03-11 NOTE — Progress Notes (Signed)
NA 123 Stopped IVF D/w dr Cherylann Ratellateef SIADH workup pending

## 2016-03-11 NOTE — Consult Note (Signed)
[facility] CONSULT NOTE    Date: 03/11/2016                  Patient Name:  Jeff Key  MRN: [id number]  DOB: [birth year]  Age / Sex: 70 y.o., male         PCP: [name], MD                 Service Requesting Consult: Dr. Juliene Pina                 Reason for Consult: Hyponatremia            History of Present Illness: Patient is a 70 y.o. male with a PMHx of Anemia, coronary artery disease status post myocardial infarction 02/10/2016 with drug-eluting stent placed in the RCA followed by staged PCI of the LAD and circumflex, dementia, GERD, history of gastric ulceration, ischemic cardiomyopathy ejection fraction 40-45%, who was admitted to [facility] on 03/10/2016 for evaluation of fever. Patient is confused and cannot provide history.  His wife states that the patient had a fever as high as 101. He was also having increasing confusion. Infection workup thus far has been negative, however he is being treated for presumptive urinary tract infection as he had pyuria. We are asked to see him for evaluation management of hyponatremia. Upon admission serum sodium was 127. Despite IV fluid hydration serum sodium is now down to 123.  TSH is acceptable at 1.5.  Medications: Outpatient medications: Prescriptions Prior to Admission  Medication Sig Dispense Refill Last Dose  . amiodarone (PACERONE) 200 MG tablet Take 1 tablet (200 mg total) by mouth daily. 90 tablet 3 03/09/2016 at 0800  . aspirin EC 81 MG tablet Take 81 mg by mouth daily.   03/09/2016 at 0800  . atorvastatin (LIPITOR) 80 MG tablet Take 1 tablet (80 mg total) by mouth daily at 6 PM. 90 tablet 3 03/09/2016 at 1800  . ferrous sulfate 325 (65 FE) MG tablet Take 325 mg by mouth daily with breakfast.   03/09/2016 at 0800  . galantamine (RAZADYNE) 4 MG tablet Take 4 mg by mouth 2 (two) times daily with a meal.    03/09/2016 at 2000  . lisinopril (PRINIVIL,ZESTRIL) 10 MG tablet Take 1 tablet (10 mg total) by  mouth daily. 30 tablet 3 03/09/2016 at 0800  . metoprolol tartrate (LOPRESSOR) 25 MG tablet Take 1 tablet (25 mg total) by mouth 2 (two) times daily. 60 tablet 3 03/09/2016 at 2000  . omeprazole (PRILOSEC) 20 MG capsule Take 20 mg by mouth daily.   03/09/2016 at 0800  . sulfaSALAzine (AZULFIDINE) 500 MG EC tablet Take 1,000 mg by mouth 2 (two) times daily.   03/09/2016 at 0800  . vitamin B-12 (CYANOCOBALAMIN) 1000 MCG tablet Take 1,000 mcg by mouth daily.   03/09/2016 at 0800  . nitroGLYCERIN (NITROSTAT) 0.4 MG SL tablet Place 1 tablet (0.4 mg total) under the tongue every 5 (five) minutes as needed for chest pain. 25 tablet 12 prn at prn  . ticagrelor (BRILINTA) 90 MG TABS tablet Take 1 tablet (90 mg total) by mouth 2 (two) times daily. (Patient not taking: Reported on 03/10/2016) 180 tablet 3 Not Taking    Current medications: Current Facility-Administered Medications  Medication Dose Route Frequency Provider Last Rate Last Dose  . acetaminophen (TYLENOL) tablet 650 mg  650 mg Oral Q6H PRN Adrian Saran, MD       Or  . acetaminophen (TYLENOL) suppository 650 mg  650 mg Rectal Q6H PRN Adrian SaranSital Mody, MD      . amiodarone (PACERONE) tablet 200 mg  200 mg Oral Daily Adrian SaranSital Mody, MD   200 mg at 03/11/16 0947  . aspirin EC tablet 81 mg  81 mg Oral Daily Adrian SaranSital Mody, MD   81 mg at 03/11/16 0946  . atorvastatin (LIPITOR) tablet 80 mg  80 mg Oral q1800 Adrian SaranSital Mody, MD   80 mg at 03/10/16 1806  . cefTRIAXone (ROCEPHIN) IVPB 1 g  1 g Intravenous Q24H Sital Mody, MD      . enoxaparin (LOVENOX) injection 40 mg  40 mg Subcutaneous Q24H Adrian SaranSital Mody, MD   40 mg at 03/10/16 2251  . ferrous sulfate tablet 325 mg  325 mg Oral Q breakfast Adrian SaranSital Mody, MD   325 mg at 03/11/16 0830  . galantamine (RAZADYNE) tablet 4 mg  4 mg Oral BID WC Adrian SaranSital Mody, MD   4 mg at 03/11/16 0830  . lisinopril (PRINIVIL,ZESTRIL) tablet 10 mg  10 mg Oral Daily Adrian SaranSital Mody, MD   10 mg at 03/11/16 0947  . metoprolol tartrate (LOPRESSOR) tablet 25 mg   25 mg Oral BID Adrian SaranSital Mody, MD   25 mg at 03/11/16 0947  . nitroGLYCERIN (NITROSTAT) SL tablet 0.4 mg  0.4 mg Sublingual Q5 min PRN Adrian SaranSital Mody, MD      . ondansetron (ZOFRAN) tablet 4 mg  4 mg Oral Q6H PRN Adrian SaranSital Mody, MD       Or  . ondansetron (ZOFRAN) injection 4 mg  4 mg Intravenous Q6H PRN Adrian SaranSital Mody, MD      . oseltamivir (TAMIFLU) capsule 75 mg  75 mg Oral BID Alexis Hugelmeyer, DO   75 mg at 03/11/16 0947  . pantoprazole (PROTONIX) EC tablet 40 mg  40 mg Oral Daily Adrian SaranSital Mody, MD   40 mg at 03/11/16 0947  . senna-docusate (Senokot-S) tablet 1 tablet  1 tablet Oral QHS PRN Adrian SaranSital Mody, MD      . sodium chloride flush (NS) 0.9 % injection 3 mL  3 mL Intravenous Q12H Adrian SaranSital Mody, MD   3 mL at 03/11/16 0951  . sulfaSALAzine (AZULFIDINE) EC tablet 1,000 mg  1,000 mg Oral BID Adrian SaranSital Mody, MD   1,000 mg at 03/11/16 0947  . ticagrelor (BRILINTA) tablet 90 mg  90 mg Oral BID Adrian SaranSital Mody, MD   90 mg at 03/11/16 0947  . vitamin B-12 (CYANOCOBALAMIN) tablet 1,000 mcg  1,000 mcg Oral Daily Adrian SaranSital Mody, MD   1,000 mcg at 03/11/16 [id number]      Allergies: No Known Allergies    Past Medical History: Past Medical History:  Diagnosis Date  . Anemia ~ 11/2015  . CAD (coronary artery disease)    a. inf STEMI on 02/10/16. RCA treated with DES followed by staged PCI of LAD and Circumflex 02/14/16.  Marland Kitchen. Dementia dx'd 01/2016  . GERD (gastroesophageal reflux disease)   . History of stomach ulcers 1980s?  . Ischemic cardiomyopathy    a. 01/2016: EF 40-45%.      Past Surgical History: Past Surgical History:  Procedure Laterality Date  . CARDIAC CATHETERIZATION N/A 02/10/2016   Procedure: Left Heart Cath and Coronary Angiography;  Surgeon: Runell GessJonathan J Berry, MD;  Location: [facility] INVASIVE CV LAB;  Service: Cardiovascular;  Laterality: N/A;  . CARDIAC CATHETERIZATION N/A 02/10/2016   Procedure: Coronary Stent Intervention;  Surgeon: Runell GessJonathan J Berry, MD;  Location: [facility] INVASIVE CV LAB;  Service: Cardiovascular;   Laterality: N/A;  . CARDIAC  CATHETERIZATION N/A 02/14/2016   Procedure: Coronary Stent Intervention-LAD and CFX;  Surgeon: Runell Gess, MD;  Location[facility] INV[facility] CV LAB;  Service: Cardiovascular;  Laterality: N/A;  . COLONOSCOPY WITH PROPOFOL N/A 05/06/2015   Procedure: COLONOSCOPY WITH PROPOFOL;  Surgeon: Wallace Cullens, MD;  Location: [facility] ENDOSCOPY;  Service: Gastroenterology;  Laterality: N/A;  . CORONARY ANGIOPLASTY WITH STENT PLACEMENT  02/14/2016   "2 stents"  . ESOPHAGOGASTRODUODENOSCOPY (EGD) WITH PROPOFOL N/A 05/06/2015   Procedure: ESOPHAGOGASTRODUODENOSCOPY (EGD) WITH PROPOFOL;  Surgeon: Wallace Cullens, MD;  Location: [facility] ENDOSCOPY;  Service: Gastroenterology;  Laterality: N/A;  . FOREARM FRACTURE SURGERY Right 1985   "got it tore up in [place]"  . FRACTURE SURGERY       Family History: Family History  Problem Relation Age of Onset  . Prostate cancer Father   . Bladder Cancer Neg Hx   . Kidney cancer Neg Hx      Social History: Social History   Social History  . Marital status: Married    Spouse name: N/A  . Number of children: N/A  . Years of education: N/A   Occupational History  . Not on file.   Social History Main Topics  . Smoking status: Former Smoker    Packs/day: 2.00    Years: 30.00    Types: Cigarettes    Quit date: 47  . Smokeless tobacco: Never Used  . Alcohol use No  . Drug use: No  . Sexual activity: No   Other Topics Concern  . Not on file   Social History Narrative  . No narrative on file     Review of Systems: Patient cannot provide review of systems secondary to confusion and dementia  Vital Signs: Blood pressure 112/63, pulse 60, temperature 99.4 F (37.4 C), temperature source Oral, resp. rate 18, height 6' (1.829 m), weight 81.6 kg (180 lb), SpO2 96 %.  Weight trends: Filed Weights   03/10/16 1255  Weight: 81.6 kg (180 lb)    Physical Exam: General: NAD, sitting up in bed  Head: Normocephalic, atraumatic.  Eyes:  Anicteric, EOMI  Nose: Mucous membranes moist, not inflammed, nonerythematous.  Throat: Oropharynx nonerythematous, no exudate appreciated.   Neck: Supple, trachea midline.  Lungs:  Normal respiratory effort. Clear to auscultation BL without crackles or wheezes.  Heart: RRR. S1 and S2 normal without gallop, murmur, or rubs.  Abdomen:  BS normoactive. Soft, Nondistended, non-tender.  No masses or organomegaly.  Extremities: No pretibial edema.  Neurologic: Awake, alert, oriented only to self, confused, pauses to answer questions  Skin: No visible rashes, scars.    Lab results: Basic Metabolic Panel:  Recent Labs Lab 03/10/16 1316 03/11/16 0216 03/11/16 1036  NA 127* 125* 123*  K 4.4 4.0  --   CL 95* 97*  --   CO2 23 22  --   GLUCOSE 89 98  --   BUN 18 12  --   CREATININE 1.25* 0.95  --   CALCIUM 9.2 7.8*  --     Liver Function Tests:  Recent Labs Lab 03/10/16 1316  AST 23  ALT 17  ALKPHOS 113  BILITOT 0.8  PROT 7.6  ALBUMIN 4.1   No results for input(s): LIPASE, AMYLASE in the last 168 hours. No results for input(s): AMMONIA in the last 168 hours.  CBC:  Recent Labs Lab 03/10/16 1316  WBC 8.2  NEUTROABS 5.8  HGB 12.0*  HCT 35.8*  MCV 84.9  PLT 274    Cardiac Enzymes:  Recent Labs Lab 03/10/16 1316 03/10/16 1719 03/10/16 2305 03/11/16 0216  TROPONINI 0.03* 0.03* 0.03* 0.03*    BNP: Invalid input(s): POCBNP  CBG: No results for input(s): GLUCAP in the last 168 hours.  Microbiology: Results for orders placed or performed during the hospital encounter of 03/10/16  Blood Culture (routine x 2)     Status: None (Preliminary result)   Collection Time: 03/10/16  1:16 PM  Result Value Ref Range Status   Specimen Description BLOOD LEFT HAND  Final   Special Requests BOTTLES DRAWN AEROBIC AND ANAEROBIC  3CC  Final   Culture NO GROWTH < 24 HOURS  Final   Report Status PENDING  Incomplete  Blood Culture (routine x 2)     Status: None (Preliminary  result)   Collection Time: 03/10/16  1:16 PM  Result Value Ref Range Status   Specimen Description BLOOD RIGHT ANTECUBITAL  Final   Special Requests   Final    BOTTLES DRAWN AEROBIC AND ANAEROBIC  AER 5CC ANA 3CC   Culture NO GROWTH < 24 HOURS  Final   Report Status PENDING  Incomplete  MRSA PCR Screening     Status: None   Collection Time: 03/10/16  2:38 PM  Result Value Ref Range Status   MRSA by PCR NEGATIVE NEGATIVE Final    Comment:        The GeneXpert MRSA Assay (FDA approved for NASAL specimens only), is one component of a comprehensive MRSA colonization surveillance program. It is not intended to diagnose MRSA infection nor to guide or monitor treatment for MRSA infections.     Coagulation Studies: No results for input(s): LABPROT, INR in the last 72 hours.  Urinalysis:  Recent Labs  03/10/16 1442  COLORURINE AMBER*  LABSPEC 1.016  PHURINE 7.0  GLUCOSEU NEGATIVE  HGBUR SMALL*  BILIRUBINUR NEGATIVE  KETONESUR NEGATIVE  PROTEINUR 30*  NITRITE NEGATIVE  LEUKOCYTESUR LARGE*      Imaging: Dg Chest Port 1 View  Result Date: 03/10/2016 CLINICAL DATA:  Sepsis question pneumonia EXAM: PORTABLE CHEST 1 VIEW COMPARISON:  Portable exam 1329 hours compared to 11/28/2015 FINDINGS: Borderline enlargement of cardiac silhouette. Mediastinal contours and pulmonary vascularity normal. Minimal atherosclerotic calcification aortic arch. Lungs clear. No pleural effusion or pneumothorax. Bones demineralized with degenerative changes at BILATERAL AC joints. IMPRESSION: No acute abnormalities. Electronically Signed   By: Ulyses Southward M.D.   On: 03/10/2016 13:48      Assessment & Plan: Pt is a 70 y.o. male with a PMHx of Anemia, coronary artery disease status post myocardial infarction 02/10/2016 with drug-eluting stent placed in the RCA followed by staged PCI of the LAD and circumflex, dementia, GERD, history of gastric ulceration, ischemic cardiomyopathy ejection fraction  40-45%, who was admitted to [facility] on 03/10/2016 for evaluation of fever.   1.  Hyponatremia, suspect SIADH. 2.  Urinary tract infection, on ceftriaxone, culture thus far negative. 3.  Altered mental status likely due to #1 and underlying dementia. 4.  Anemia NOS.  Plan:  The patient presented to [facility] with fever. He was found to have pyuria and is presumptively being treated for a urinary tract infection. We are consulted for the evaluation management of hyponatremia. Serum sodium was 127 upon admission but it has drifted down to 123 despite IV fluid hydration. This is highly suggestive of SIADH. We will proceed with serum osmolality, urine osmolality, uric acid, and cortisol. He is quite confused at the moment. Therefore we will attempt a trial with  tolvaptan 15mg  po daily.  Follow serum sodium very closely while he's on this medication.  Further plan as patient progesses.

## 2016-03-11 NOTE — Progress Notes (Addendum)
[facility] [place] [facility]   PATIENT NAME: Jeff HarmanRoger Key    MR#:  [id number]  DATE OF BIRTH:  1946/01/20  SUBJECTIVE:  Doing well this am MS is back to baseline  REVIEW OF SYSTEMS:    Review of Systems  Constitutional: Negative for chills, fever and malaise/fatigue.  HENT: Negative.  Negative for ear discharge, ear pain, hearing loss, nosebleeds and sore throat.   Eyes: Negative.  Negative for blurred vision and pain.  Respiratory: Negative.  Negative for cough, hemoptysis, shortness of breath and wheezing.   Cardiovascular: Negative.  Negative for chest pain, palpitations and leg swelling.  Gastrointestinal: Negative.  Negative for abdominal pain, blood in stool, diarrhea, nausea and vomiting.  Genitourinary: Negative.  Negative for dysuria.  Musculoskeletal: Negative.  Negative for back pain.  Skin: Negative.   Neurological: Positive for weakness. Negative for dizziness, tremors, speech change, focal weakness, seizures and headaches.  Endo/Heme/Allergies: Negative.  Does not bruise/bleed easily.  Psychiatric/Behavioral: Positive for memory loss. Negative for depression, hallucinations and suicidal ideas.    Tolerating Diet:yes      DRUG ALLERGIES:  No Known Allergies  VITALS:  Blood pressure 115/61, pulse 61, temperature 98.9 F (37.2 C), temperature source Oral, resp. rate 18, height 6' (1.829 m), weight 81.6 kg (180 lb), SpO2 95 %.  PHYSICAL EXAMINATION:   Physical Exam  Constitutional: He is well-developed, well-nourished, and in no distress. No distress.  HENT:  Head: Normocephalic.  Eyes: No scleral icterus.  Neck: Normal range of motion. Neck supple. No JVD present. No tracheal deviation present.  Cardiovascular: Normal rate, regular rhythm and normal heart sounds.  Exam reveals no gallop and no friction rub.   No murmur heard. Pulmonary/Chest: Effort normal and breath sounds normal. No respiratory distress. He has no wheezes. He has  no rales. He exhibits no tenderness.  Abdominal: Soft. Bowel sounds are normal. He exhibits no distension and no mass. There is no tenderness. There is no rebound and no guarding.  Musculoskeletal: Normal range of motion. He exhibits no edema.  Neurological: He is alert.  Skin: Skin is warm. No rash noted. No erythema.  Psychiatric: Affect and judgment normal.      LABORATORY PANEL:   CBC  Recent Labs Lab 03/10/16 1316  WBC 8.2  HGB 12.0*  HCT 35.8*  PLT 274   ------------------------------------------------------------------------------------------------------------------  Chemistries   Recent Labs Lab 03/10/16 1316 03/11/16 0216  NA 127* 125*  K 4.4 4.0  CL 95* 97*  CO2 23 22  GLUCOSE 89 98  BUN 18 12  CREATININE 1.25* 0.95  CALCIUM 9.2 7.8*  AST 23  --   ALT 17  --   ALKPHOS 113  --   BILITOT 0.8  --    ------------------------------------------------------------------------------------------------------------------  Cardiac Enzymes  Recent Labs Lab 03/10/16 1719 03/10/16 2305 03/11/16 0216  TROPONINI 0.03* 0.03* 0.03*   ------------------------------------------------------------------------------------------------------------------  RADIOLOGY:  Dg Chest Port 1 View  Result Date: 03/10/2016 CLINICAL DATA:  Sepsis question pneumonia EXAM: PORTABLE CHEST 1 VIEW COMPARISON:  Portable exam 1329 hours compared to 11/28/2015 FINDINGS: Borderline enlargement of cardiac silhouette. Mediastinal contours and pulmonary vascularity normal. Minimal atherosclerotic calcification aortic arch. Lungs clear. No pleural effusion or pneumothorax. Bones demineralized with degenerative changes at BILATERAL AC joints. IMPRESSION: No acute abnormalities. Electronically Signed   By: Ulyses SouthwardMark  Boles M.D.   On: 03/10/2016 13:48     ASSESSMENT AND PLAN:    70 year old male with recent STEMI who presents with sepsis.  1. Sepsis with fever at home and hypotension:This is  from UTI and Influenza. Change cefepime and vancomycin to Rocephin for UTI and continue Tamiflu for influenza  2. Acute encephalopathy, metabolic: Improved 3. Acute kidney injury: This is  due to prerenal azotemia. Continue IV fluids and repeat BMP in a.m.  4. Hyponatremia: I have asked nephrology consultation due to decrease in sodium level this morning. Repeat sodium level every 6 hours  5 Recent STEMI status post drug-eluting stent to RCA, LAD and circumflex: Continue aspirin, atorvastatin, lisinopril, metoprolol,Brilanta Follow troponins 6. Ischemic cardio myopathy EF of 40-45%: Continue lisinopril and metoprolol Watch for signs of fluid overload  7. 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  CODE STATUS: full  TOTAL TIME TAKING CARE OF THIS PATIENT: 30 minutes.     POSSIBLE D/C tomorrow, DEPENDING ON CLINICAL CONDITION.   Bayli Quesinberry M.D on 03/11/2016 at 11:11 AM  Between 7am to 6pm - Pager - [phone] After 6pm go to [email or web address] - password [email or web address]  [facility] [place] [facility]  Office  [phone]  CC: Primary care physician; Barbette ReichmannHANDE,VISHWANATH, MD  Note: This dictation was prepared with Dragon dictation along with smaller phrase technology. Any transcriptional errors that result from this process are unintentional.

## 2016-03-11 NOTE — Progress Notes (Signed)
Pharmacy Antibiotic Note  Jeff Key is a 70 y.o. male admitted on 03/10/2016 with sepsis and UTI.  Pharmacy has been consulted for ceftraixone dosing. Last dose of cefepime at 0948 today.   Plan: Will order ceftriaxone 1 g IV q24h to start at 1800.   Height: 6' (182.9 cm) Weight: 180 lb (81.6 kg) IBW/kg (Calculated) : 77.6  Temp (24hrs), Avg:99.3 F (37.4 C), Min:98.8 F (37.1 C), Max:100.3 F (37.9 C)   Recent Labs Lab 03/10/16 1316 03/11/16 0216  WBC 8.2  --   CREATININE 1.25* 0.95  LATICACIDVEN 1.0  --     Estimated Creatinine Clearance: 78.3 mL/min (by C-G formula based on SCr of 0.95 mg/dL).    No Known Allergies  Antimicrobials this admission: Vanc/cefepime 12/22>>12/23 Ceftriaxone 12/23 >>  Dose adjustments this admission:   Microbiology results: 12/22 BCx: sent 12/22 UCx: sent  12/22 MRSA PCR: neg  Thank you for allowing pharmacy to be a part of this patient's care.  Marty HeckWang, Jabri Blancett L 03/11/2016 10:31 AM

## 2016-03-12 LAB — SODIUM
SODIUM: 131 mmol/L — AB (ref 135–145)
SODIUM: 133 mmol/L — AB (ref 135–145)
SODIUM: 130 mmol/L — AB (ref 135–145)
Sodium: 132 mmol/L — ABNORMAL LOW (ref 135–145)
Sodium: 131 mmol/L — ABNORMAL LOW (ref 135–145)

## 2016-03-12 MED ORDER — SODIUM CHLORIDE 0.9 % IV SOLN
3.0000 g | Freq: Four times a day (QID) | INTRAVENOUS | Status: DC
  Administered 2016-03-12 – 2016-03-13 (×4): 3 g via INTRAVENOUS
  Filled 2016-03-12 (×7): qty 3

## 2016-03-12 NOTE — Progress Notes (Signed)
[facility]  ROUNDING NOTE   Subjective:  Patient seen at bedside. He is much more awake and alert and conversant today. Serum sodium up to 133. Tolvaptan and has been stopped. Diagnosed with the flu.  Objective:  Vital signs in last 24 hours:  Temp:  [97.9 F (36.6 C)-102.3 F (39.1 C)] 97.9 F (36.6 C) (12/24 1128) Pulse Rate:  [68-93] 77 (12/24 1128) Resp:  [18-19] 19 (12/24 1128) BP: (97-130)/(59-83) 112/69 (12/24 1128) SpO2:  [93 %-99 %] 99 % (12/24 1128)  Weight change:  Filed Weights   03/10/16 1255  Weight: 81.6 kg (180 lb)    Intake/Output: I/O last 3 completed shifts: In: 1742.5 [I.V.:1342.5; IV Piggyback:400] Out: 4150 [Urine:4150]   Intake/Output this shift:  No intake/output data recorded.  Physical Exam: General: No acute distress  Head: Normocephalic, atraumatic. Moist oral mucosal membranes  Eyes: Anicteric  Neck: Supple, trachea midline  Lungs:  Clear to auscultation, normal effort  Heart: S1S2 no rubs  Abdomen:  Soft, nontender, bowel sounds present  Extremities: No peripheral edema.  Neurologic: Awake, alert, oriented to time/person/place  Skin: No lesions       Basic Metabolic Panel:  Recent Labs Lab 03/10/16 1316 03/11/16 0216  03/11/16 1330 03/11/16 2108 03/11/16 2249 03/12/16 0506 03/12/16 1016  NA 127* 125*  < > 123*  124* 129* 128* 131* 133*  K 4.4 4.0  --  3.6  --   --   --   --   CL 95* 97*  --  93*  --   --   --   --   CO2 23 22  --  22  --   --   --   --   GLUCOSE 89 98  --  100*  --   --   --   --   BUN 18 12  --  11  --   --   --   --   CREATININE 1.25* 0.95  --  0.91  --   --   --   --   CALCIUM 9.2 7.8*  --  7.9*  --   --   --   --   < > = values in this interval not displayed.  Liver Function Tests:  Recent Labs Lab 03/10/16 1316  AST 23  ALT 17  ALKPHOS 113  BILITOT 0.8  PROT 7.6  ALBUMIN 4.1   No results for input(s): LIPASE, AMYLASE in the last 168 hours. No results for input(s):  AMMONIA in the last 168 hours.  CBC:  Recent Labs Lab 03/10/16 1316  WBC 8.2  NEUTROABS 5.8  HGB 12.0*  HCT 35.8*  MCV 84.9  PLT 274    Cardiac Enzymes:  Recent Labs Lab 03/10/16 1316 03/10/16 1719 03/10/16 2305 03/11/16 0216  TROPONINI 0.03* 0.03* 0.03* 0.03*    BNP: Invalid input(s): POCBNP  CBG: No results for input(s): GLUCAP in the last 168 hours.  Microbiology: Results for orders placed or performed during the hospital encounter of 03/10/16  Blood Culture (routine x 2)     Status: None (Preliminary result)   Collection Time: 03/10/16  1:16 PM  Result Value Ref Range Status   Specimen Description BLOOD LEFT HAND  Final   Special Requests BOTTLES DRAWN AEROBIC AND ANAEROBIC  3CC  Final   Culture NO GROWTH 2 DAYS  Final   Report Status PENDING  Incomplete  Blood Culture (routine x 2)     Status: None (Preliminary result)  Collection Time: 03/10/16  1:16 PM  Result Value Ref Range Status   Specimen Description BLOOD RIGHT ANTECUBITAL  Final   Special Requests   Final    BOTTLES DRAWN AEROBIC AND ANAEROBIC  AER 5CC ANA 3CC   Culture NO GROWTH 2 DAYS  Final   Report Status PENDING  Incomplete  MRSA PCR Screening     Status: None   Collection Time: 03/10/16  2:38 PM  Result Value Ref Range Status   MRSA by PCR NEGATIVE NEGATIVE Final    Comment:        The GeneXpert MRSA Assay (FDA approved for NASAL specimens only), is one component of a comprehensive MRSA colonization surveillance program. It is not intended to diagnose MRSA infection nor to guide or monitor treatment for MRSA infections.   Urine culture     Status: Abnormal (Preliminary result)   Collection Time: 03/10/16  2:42 PM  Result Value Ref Range Status   Specimen Description URINE, RANDOM  Final   Special Requests NONE  Final   Culture (A)  Final    >=100,000 COLONIES/mL ENTEROCOCCUS FAECALIS REPEATING SUSCEPTIBILITIES Performed at [facility]    Report Status PENDING   Incomplete    Coagulation Studies: No results for input(s): LABPROT, INR in the last 72 hours.  Urinalysis:  Recent Labs  03/10/16 1442  COLORURINE AMBER*  LABSPEC 1.016  PHURINE 7.0  GLUCOSEU NEGATIVE  HGBUR SMALL*  BILIRUBINUR NEGATIVE  KETONESUR NEGATIVE  PROTEINUR 30*  NITRITE NEGATIVE  LEUKOCYTESUR LARGE*      Imaging: Dg Chest Port 1 View  Result Date: 03/10/2016 CLINICAL DATA:  Sepsis question pneumonia EXAM: PORTABLE CHEST 1 VIEW COMPARISON:  Portable exam 1329 hours compared to 11/28/2015 FINDINGS: Borderline enlargement of cardiac silhouette. Mediastinal contours and pulmonary vascularity normal. Minimal atherosclerotic calcification aortic arch. Lungs clear. No pleural effusion or pneumothorax. Bones demineralized with degenerative changes at BILATERAL AC joints. IMPRESSION: No acute abnormalities. Electronically Signed   By: Ulyses SouthwardMark  Boles M.D.   On: 03/10/2016 13:48     Medications:    . amiodarone  200 mg Oral Daily  . ampicillin-sulbactam (UNASYN) IV  3 g Intravenous Q6H  . aspirin EC  81 mg Oral Daily  . atorvastatin  80 mg Oral q1800  . enoxaparin (LOVENOX) injection  40 mg Subcutaneous Q24H  . ferrous sulfate  325 mg Oral Q breakfast  . galantamine  4 mg Oral BID WC  . lisinopril  10 mg Oral Daily  . metoprolol tartrate  25 mg Oral BID  . oseltamivir  75 mg Oral BID  . pantoprazole  40 mg Oral Daily  . sodium chloride flush  3 mL Intravenous Q12H  . sulfaSALAzine  1,000 mg Oral BID  . ticagrelor  90 mg Oral BID  . vitamin B-12  1,000 mcg Oral Daily   acetaminophen **OR** acetaminophen, nitroGLYCERIN, ondansetron **OR** ondansetron (ZOFRAN) IV, phenol, senna-docusate  Assessment/ Plan:  70 y.o. male with a PMHx of Anemia, coronary artery disease status post myocardial infarction 02/10/2016 with drug-eluting stent placed in the RCA followed by staged PCI of the LAD and circumflex, dementia, GERD, history of gastric ulceration, ischemic cardiomyopathy  ejection fraction 40-45%, who was [facility]10[facility]2016 for evaluation of fever.   1.  Hyponatremia, suspect SIADH. 2.  Urinary tract infection, on ceftriaxone, culture thus far negative. 3.  Altered mental status likely due to #1 and underlying dementia. 4.  Anemia NOS.  Plan:  Hyponatremia has improved significantly. Sodium  up to 133. We have discontinue tolvaptan. The patient's mental status also appears to have improved. Patient also diagnosed with the influenza virus. He has been started on appropriate therapy. We will continue to monitor his progress over the course of the hospitalization.   LOS: 2 Deseree Zemaitis 12/24/201712:22 PM

## 2016-03-12 NOTE — Progress Notes (Signed)
[employer] - [place] [facility]   PATIENT NAME: Jeff HarmanRoger Macdonell    MR#:  [id number]  DATE OF BIRTH:  Jan 02, 1946  SUBJECTIVE:  No acute issues overnight still with some confusion but wife does not notice big difference   REVIEW OF SYSTEMS:    Review of Systems  Constitutional: Positive for fever. Negative for chills and malaise/fatigue.  HENT: Negative.  Negative for ear discharge, ear pain, hearing loss, nosebleeds and sore throat.   Eyes: Negative.  Negative for blurred vision and pain.  Respiratory: Negative.  Negative for cough, hemoptysis, shortness of breath and wheezing.   Cardiovascular: Negative.  Negative for chest pain, palpitations and leg swelling.  Gastrointestinal: Negative.  Negative for abdominal pain, blood in stool, diarrhea, nausea and vomiting.  Genitourinary: Negative.  Negative for dysuria.  Musculoskeletal: Negative.  Negative for back pain.  Skin: Negative.   Neurological: Negative for dizziness, tremors, speech change, focal weakness, seizures, weakness and headaches.  Endo/Heme/Allergies: Negative.  Does not bruise/bleed easily.  Psychiatric/Behavioral: Positive for memory loss. Negative for depression, hallucinations and suicidal ideas.    Tolerating Diet:yes      DRUG ALLERGIES:  No Known Allergies  VITALS:  Blood pressure 130/83, pulse 68, temperature 98 F (36.7 C), temperature source Oral, resp. rate 18, height 6' (1.829 m), weight 81.6 kg (180 lb), SpO2 95 %.  PHYSICAL EXAMINATION:   Physical Exam  Constitutional: He is well-developed, well-nourished, and in no distress. No distress.  HENT:  Head: Normocephalic.  Eyes: No scleral icterus.  Neck: Normal range of motion. Neck supple. No JVD present. No tracheal deviation present.  Cardiovascular: Normal rate, regular rhythm and normal heart sounds.  Exam reveals no gallop and no friction rub.   No murmur heard. Pulmonary/Chest: Effort normal and breath sounds normal.  No respiratory distress. He has no wheezes. He has no rales. He exhibits no tenderness.  Abdominal: Soft. Bowel sounds are normal. He exhibits no distension and no mass. There is no tenderness. There is no rebound and no guarding.  Musculoskeletal: Normal range of motion. He exhibits no edema.  Neurological: He is alert.  Skin: Skin is warm. No rash noted. No erythema.  Psychiatric: Affect and judgment normal.      LABORATORY PANEL:   CBC  Recent Labs Lab 03/10/16 1316  WBC 8.2  HGB 12.0*  HCT 35.8*  PLT 274   ------------------------------------------------------------------------------------------------------------------  Chemistries   Recent Labs Lab 03/10/16 1316  03/11/16 1330  03/12/16 0506  NA 127*  < > 123*  124*  < > 131*  K 4.4  < > 3.6  --   --   CL 95*  < > 93*  --   --   CO2 23  < > 22  --   --   GLUCOSE 89  < > 100*  --   --   BUN 18  < > 11  --   --   CREATININE 1.25*  < > 0.91  --   --   CALCIUM 9.2  < > 7.9*  --   --   AST 23  --   --   --   --   ALT 17  --   --   --   --   ALKPHOS 113  --   --   --   --   BILITOT 0.8  --   --   --   --   < > = values in this interval not  displayed. ------------------------------------------------------------------------------------------------------------------  Cardiac Enzymes  Recent Labs Lab 03/10/16 1719 03/10/16 2305 03/11/16 0216  TROPONINI 0.03* 0.03* 0.03*   ------------------------------------------------------------------------------------------------------------------  RADIOLOGY:  Dg Chest Port 1 View  Result Date: 03/10/2016 CLINICAL DATA:  Sepsis question pneumonia EXAM: PORTABLE CHEST 1 VIEW COMPARISON:  Portable exam 1329 hours compared to 11/28/2015 FINDINGS: Borderline enlargement of cardiac silhouette. Mediastinal contours and pulmonary vascularity normal. Minimal atherosclerotic calcification aortic arch. Lungs clear. No pleural effusion or pneumothorax. Bones demineralized with  degenerative changes at BILATERAL AC joints. IMPRESSION: No acute abnormalities. Electronically Signed   By: Ulyses SouthwardMark  Boles M.D.   On: 03/10/2016 13:48     ASSESSMENT AND PLAN:    70 year old male with recent STEMI who presents with sepsis.   1. Sepsis with fever at home and hypotension:This is from Enterococcus UTI and Influenza. Will need to discontinue Rocephin and called lab for sensitivities which will be back tomorrow called pharmacy to assist with antibiotics.  Change Rocephin due to enterococcus Enterococcus UTI and continue Tamiflu for influenza.Follow-up on final urine culture. Blood cultures are negative.  2. Acute encephalopathy, metabolic: Improved 3. Acute kidney injury: This is  due to prerenal azotemia. Continue IV fluids and repeat BMP in a.m.  4. 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 Sodium has improved with TOLVAPTAN. Discontinue this and follow sodium level   5 Recent STEMI status post drug-eluting stent to RCA, LAD and circumflex: Continue aspirin, atorvastatin, lisinopril, metoprolol,Brilanta  6. Ischemic cardio myopathy EF of 40-45%: Continue lisinopril and metoprolol Watch for signs of fluid overload  7. 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  CODE STATUS: full  TOTAL TIME TAKING CARE OF THIS PATIENT: 30 minutes.     POSSIBLE D/C tomorrow, DEPENDING ON CLINICAL CONDITION.   Zilda No M.D on 03/12/2016 at 8:45 AM  Between 7am to 6pm - Pager - [phone] After 6pm go to [email or web address] - password [email or web address]  [facility]  Office  [phone]  CC: Primary care physician; Barbette ReichmannHANDE,VISHWANATH, MD  Note: This dictation was prepared with Dragon dictation along with smaller phrase technology. Any transcriptional errors that  result from this process are unintentional.

## 2016-03-13 LAB — URINE CULTURE: Culture: >=100000 — AB

## 2016-03-13 LAB — SODIUM: SODIUM: 133 mmol/L — AB (ref 135–145)

## 2016-03-13 MED ORDER — AMOXICILLIN-POT CLAVULANATE 875-125 MG PO TABS: 1.0000 | ORAL_TABLET | Freq: Two times a day (BID) | ORAL | 0 refills | Status: DC

## 2016-03-13 MED ORDER — LEVOFLOXACIN 500 MG PO TABS: 500.0000 mg | ORAL_TABLET | Freq: Every day | ORAL | 0 refills | Status: AC

## 2016-03-13 MED ORDER — OSELTAMIVIR PHOSPHATE 75 MG PO CAPS: 75.0000 mg | ORAL_CAPSULE | Freq: Two times a day (BID) | ORAL | 0 refills | Status: AC

## 2016-03-13 NOTE — Progress Notes (Signed)
[facility]  ROUNDING NOTE   Subjective:  Late entry. Serum sodium has improved to 133. Patient was alert and oriented to time, person, and place. Wife at bedside.   Objective:  Vital signs in last 24 hours:  Temp:  [97.6 F (36.4 C)-98.1 F (36.7 C)] 97.6 F (36.4 C) (12/25 0519) Pulse Rate:  [60-77] 62 (12/25 0519) Resp:  [15-19] 15 (12/25 0519) BP: (93-112)/(48-69) 112/48 (12/25 0519) SpO2:  [98 %-99 %] 98 % (12/25 0519)  Weight change:  Filed Weights   03/10/16 1255  Weight: 81.6 kg (180 lb)    Intake/Output: I/O last 3 completed shifts: In: 350 [IV Piggyback:350] Out: 2500 [Urine:2500]   Intake/Output this shift:  No intake/output data recorded.  Physical Exam: General: No acute distress  Head: Normocephalic, atraumatic. Moist oral mucosal membranes  Eyes: Anicteric  Neck: Supple, trachea midline  Lungs:  Clear to auscultation, normal effort  Heart: S1S2 no rubs  Abdomen:  Soft, nontender, bowel sounds present  Extremities: No peripheral edema.  Neurologic: Awake, alert, oriented to time/person/place  Skin: No lesions       Basic Metabolic Panel:  Recent Labs Lab 03/10/16 1316 03/11/16 0216  03/11/16 1330  03/12/16 0515 03/12/16 1016 03/12/16 1632 03/12/16 2225 03/13/16 0458  NA 127* 125*  < > 123*  124*  < > 132* 133* 130* 131* 133*  K 4.4 4.0  --  3.6  --   --   --   --   --   --   CL 95* 97*  --  93*  --   --   --   --   --   --   CO2 23 22  --  22  --   --   --   --   --   --   GLUCOSE 89 98  --  100*  --   --   --   --   --   --   BUN 18 12  --  11  --   --   --   --   --   --   CREATININE 1.25* 0.95  --  0.91  --   --   --   --   --   --   CALCIUM 9.2 7.8*  --  7.9*  --   --   --   --   --   --   < > = values in this interval not displayed.  Liver Function Tests:  Recent Labs Lab 03/10/16 1316  AST 23  ALT 17  ALKPHOS 113  BILITOT 0.8  PROT 7.6  ALBUMIN 4.1   No results for input(s): LIPASE, AMYLASE in the  last 168 hours. No results for input(s): AMMONIA in the last 168 hours.  CBC:  Recent Labs Lab 03/10/16 1316  WBC 8.2  NEUTROABS 5.8  HGB 12.0*  HCT 35.8*  MCV 84.9  PLT 274    Cardiac Enzymes:  Recent Labs Lab 03/10/16 1316 03/10/16 1719 03/10/16 2305 03/11/16 0216  TROPONINI 0.03* 0.03* 0.03* 0.03*    BNP: Invalid input(s): POCBNP  CBG: No results for input(s): GLUCAP in the last 168 hours.  Microbiology: Results for orders placed or performed during the hospital encounter of 03/10/16  Blood Culture (routine x 2)     Status: None (Preliminary result)   Collection Time: 03/10/16  1:16 PM  Result Value Ref Range Status   Specimen Description BLOOD LEFT HAND  Final  Special Requests BOTTLES DRAWN AEROBIC AND ANAEROBIC  3CC  Final   Culture NO GROWTH 3 DAYS  Final   Report Status PENDING  Incomplete  Blood Culture (routine x 2)     Status: None (Preliminary result)   Collection Time: 03/10/16  1:16 PM  Result Value Ref Range Status   Specimen Description BLOOD RIGHT ANTECUBITAL  Final   Special Requests   Final    BOTTLES DRAWN AEROBIC AND ANAEROBIC  AER 5CC ANA 3CC   Culture NO GROWTH 3 DAYS  Final   Report Status PENDING  Incomplete  MRSA PCR Screening     Status: None   Collection Time: 03/10/16  2:38 PM  Result Value Ref Range Status   MRSA by PCR NEGATIVE NEGATIVE Final    Comment:        The GeneXpert MRSA Assay (FDA approved for NASAL specimens only), is one component of a comprehensive MRSA colonization surveillance program. It is not intended to diagnose MRSA infection nor to guide or monitor treatment for MRSA infections.   Urine culture     Status: Abnormal   Collection Time: 03/10/16  2:42 PM  Result Value Ref Range Status   Specimen Description URINE, RANDOM  Final   Special Requests NONE  Final   Culture >=100,000 COLONIES/mL ENTEROCOCCUS FAECALIS (A)  Final   Report Status 03/13/2016 FINAL  Final   Organism ID, Bacteria  ENTEROCOCCUS FAECALIS (A)  Final      Susceptibility   Enterococcus faecalis - MIC*    AMPICILLIN <=2 SENSITIVE Sensitive     LEVOFLOXACIN 1 SENSITIVE Sensitive     NITROFURANTOIN <=16 SENSITIVE Sensitive     VANCOMYCIN 2 SENSITIVE Sensitive     * >=100,000 COLONIES/mL ENTEROCOCCUS FAECALIS    Coagulation Studies: No results for input(s): LABPROT, INR in the last 72 hours.  Urinalysis:  Recent Labs  03/10/16 1442  COLORURINE AMBER*  LABSPEC 1.016  PHURINE 7.0  GLUCOSEU NEGATIVE  HGBUR SMALL*  BILIRUBINUR NEGATIVE  KETONESUR NEGATIVE  PROTEINUR 30*  NITRITE NEGATIVE  LEUKOCYTESUR LARGE*      Imaging: No results found.   Medications:    . amiodarone  200 mg Oral Daily  . ampicillin-sulbactam (UNASYN) IV  3 g Intravenous Q6H  . aspirin EC  81 mg Oral Daily  . atorvastatin  80 mg Oral q1800  . enoxaparin (LOVENOX) injection  40 mg Subcutaneous Q24H  . ferrous sulfate  325 mg Oral Q breakfast  . galantamine  4 mg Oral BID WC  . lisinopril  10 mg Oral Daily  . metoprolol tartrate  25 mg Oral BID  . oseltamivir  75 mg Oral BID  . pantoprazole  40 mg Oral Daily  . sodium chloride flush  3 mL Intravenous Q12H  . sulfaSALAzine  1,000 mg Oral BID  . ticagrelor  90 mg Oral BID  . vitamin B-12  1,000 mcg Oral Daily   acetaminophen **OR** acetaminophen, nitroGLYCERIN, ondansetron **OR** ondansetron (ZOFRAN) IV, phenol, senna-docusate  Assessment/ Plan:  70 y.o. male with a PMHx of Anemia, coronary artery disease status post myocardial infarction 02/10/2016 with drug-eluting stent placed in the RCA followed by staged PCI of the LAD and circumflex, dementia, GERD, history of gastric ulceration, ischemic cardiomyopathy ejection fraction 40-45%, who was admitted to [facility] on 03/10/2016 for evaluation of fever.   1.  Hyponatremia, suspect SIADH. 2.  Urinary tract infection, on Unasayn, E. Faecalis 3.  Altered mental status likely due to #1 and underlying dementia. 4.  Anemia  NOS.  Plan:  The patient has improved significantly. Serum sodium up to 133. Enterococcus faecalis noted in the urine and the patient was treated with Unasyn. Patient was treated with tolvaptan this admission. His SIADH should resolve. Recommend monitoring of his serum sodium by his primary care physician upon discharge. Thanks for allowing us to participate.    LOS: 3 Rosaisela Jamroz 12/25/201710:59 AM

## 2016-03-13 NOTE — Discharge Summary (Addendum)
[employer] [facility] - [place] at [facility]   PATIENT NAME: Jeff Key    MR#:  [id number]  DATE OF BIRTH:  1945/04/25  DATE OF ADMISSION:  03/10/2016 ADMITTING PHYSICIAN: Adrian Saran, MD  DATE OF DISCHARGE: 03/13/2016  PRIMARY CARE PHYSICIAN: [name], MD    ADMISSION DIAGNOSIS:  Hyponatremia [E87.1] Acute encephalopathy [G93.40] Sepsis, due to unspecified organism (HCC) [A41.9] Hypotension, unspecified hypotension type [I95.9]  DISCHARGE DIAGNOSIS:  Active Problems:   Sepsis (HCC)   SECONDARY DIAGNOSIS:   Past Medical History:  Diagnosis Date  . Anemia ~ 11/2015  . CAD (coronary artery disease)    a. inf STEMI on 02/10/16. RCA treated with DES followed by staged PCI of LAD and Circumflex 02/14/16.  Marland Kitchen Dementia dx'd 01/2016  . GERD (gastroesophageal reflux disease)   . History of stomach ulcers 1980s?  . Ischemic cardiomyopathy    a. 01/2016: EF 40-45%.     HOSPITAL COURSE:    70 year old male with recent STEMI who presents with sepsis.   1. Sepsis with fever at home and hypotension:This is from Enterococcus UTI and Influenza. Blood pressure has showed improvement as well as mental status. He will be discharged to complete course of treatment for UTI and Influenza.  Blood cultures are negative.  2. Acute encephalopathy, metabolic: Improved 3. Acute kidney injury: This was  due to prerenal azotemia.  4. 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 Sodium has improved. He was initiated on TOLVAPTAN. Once Na level showed improvement this was discontinued and sodium remains improved.   5 Recent STEMI status post drug-eluting stent to RCA, LAD and circumflex: Continue aspirin, atorvastatin, lisinopril, metoprolol,Brilanta  6. Ischemic cardio myopathy EF of 40-45%: Continue lisinopril and metoprolol Watch for signs of fluid overload  7. 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 Qty: 4 tablet, Refills: 0    oseltamivir (TAMIFLU) 75 MG capsule Take 1 capsule (75 mg total) by mouth 2 (two) times daily. Qty: 4 capsule, Refills: 0      CONTINUE these medications which have NOT CHANGED   Details  amiodarone (PACERONE) 200 MG tablet Take 1 tablet (200 mg total) by mouth daily. Qty: 90 tablet, Refills: 3    aspirin EC 81 MG tablet Take 81 mg by mouth daily.    atorvastatin (LIPITOR) 80 MG tablet Take 1 tablet (80 mg total) by mouth daily at 6 PM. Qty: 90 tablet, Refills: 3    ferrous sulfate 325 (65 FE) MG tablet Take 325 mg by mouth daily with breakfast.    galantamine (RAZADYNE) 4 MG tablet Take 4 mg by mouth 2 (two) times daily with a meal.     lisinopril (PRINIVIL,ZESTRIL) 10 MG tablet Take 1 tablet (10 mg total) by mouth daily. Qty: 30 tablet, Refills: 3    metoprolol tartrate (LOPRESSOR) 25 MG tablet Take 1 tablet (25 mg total) by mouth 2 (two) times daily. Qty: 60 tablet, Refills: 3    omeprazole (PRILOSEC) 20 MG capsule Take 20 mg by mouth daily.    sulfaSALAzine (AZULFIDINE) 500 MG EC tablet Take  1,000 mg by mouth 2 (two) times daily.    vitamin B-12 (CYANOCOBALAMIN) 1000 MCG tablet Take 1,000 mcg by mouth daily.    nitroGLYCERIN (NITROSTAT) 0.4 MG SL tablet Place 1 tablet (0.4 mg total) under the tongue every 5 (five) minutes as needed for chest pain. Qty: 25 tablet, Refills: 12    ticagrelor (BRILINTA) 90 MG TABS tablet Take 1 tablet (90 mg total) by mouth 2 (two) times daily. Qty: 180 tablet, Refills: 3              Today    CHIEF COMPLAINT:  Doing well no issues overnight   VITAL SIGNS:  Blood pressure (!) 112/48, pulse 62, temperature 97.6 F (36.4 C), temperature source Oral, resp. rate 15, height 6' (1.829 m), weight 81.6 kg (180 lb), SpO2 98 %.   REVIEW OF SYSTEMS:  Review of Systems  Constitutional: Negative.  Negative for chills, fever and malaise/fatigue.  HENT: Negative.  Negative for ear discharge, ear pain, hearing loss, nosebleeds and sore throat.   Eyes: Negative.  Negative for blurred vision and pain.  Respiratory: Negative.  Negative for cough, hemoptysis, shortness of breath and wheezing.   Cardiovascular: Negative.  Negative for chest pain, palpitations and leg swelling.  Gastrointestinal: Negative.  Negative for abdominal pain, blood in stool, diarrhea, nausea and vomiting.  Genitourinary: Negative.  Negative for dysuria.  Musculoskeletal: Negative.  Negative for back pain.  Skin: Negative.   Neurological: Negative for dizziness, tremors, speech change, focal weakness, seizures and headaches.  Endo/Heme/Allergies: Negative.  Does not bruise/bleed easily.  Psychiatric/Behavioral: Positive for memory loss. Negative for depression, hallucinations and suicidal ideas.     PHYSICAL EXAMINATION:  GENERAL:  70 y.o.-year-old patient lying in the bed with no acute distress.  NECK:  Supple, no jugular venous distention. No thyroid enlargement, no tenderness.  LUNGS: Normal breath sounds bilaterally, no wheezing, rales,rhonchi  No use of accessory muscles of respiration.  CARDIOVASCULAR: S1, S2 normal. No murmurs, rubs, or gallops.  ABDOMEN: Soft, non-tender, non-distended. Bowel sounds present. No organomegaly or mass.  EXTREMITIES: No pedal edema, cyanosis, or clubbing.  PSYCHIATRIC: The patient is alert and oriented x 3.  SKIN: No obvious rash, lesion, or ulcer.   DATA REVIEW:   CBC  Recent Labs Lab 03/10/16 1316  WBC 8.2  HGB 12.0*  HCT 35.8*  PLT 274    Chemistries    Recent Labs Lab 03/10/16 1316  03/11/16 1330  03/13/16 0458  NA 127*  < > 123*  124*  < > 133*  K 4.4  < > 3.6  --   --   CL 95*  < > 93*  --   --   CO2 23  < > 22  --   --   GLUCOSE 89  < > 100*  --   --   BUN 18  < > 11  --   --   CREATININE 1.25*  < > 0.91  --   --   CALCIUM 9.2  < > 7.9*  --   --   AST 23  --   --   --   --   ALT 17  --   --   --   --   ALKPHOS 113  --   --   --   --   BILITOT 0.8  --   --   --   --   < > = values in this interval not displayed.  Cardiac Enzymes  Recent  Labs Lab 03/10/16 1719 03/10/16 2305 03/11/16 0216  TROPONINI 0.03* 0.03* 0.03*    Microbiology Results  @MICRORSLT48 @  RADIOLOGY:  No results found.    Management plans discussed with the patient and wife and they are in agreement. Stable for discharge home  Patient should follow up with pcp  CODE STATUS:     Code Status Orders        Start     Ordered   03/10/16 1438  Full code  Continuous     03/10/16 1439    Code Status History    Date Active Date Inactive Code Status Order ID Comments User Context   02/10/2016  2:33 PM 02/15/2016  3:22 PM Full Code [id number]  Runell GessJonathan J Berry, MD Inpatient   11/29/2015  3:37 AM 11/30/2015  4:17 PM Full Code [id number]  Arnaldo NatalMichael S Diamond, MD Inpatient      TOTAL TIME TAKING CARE OF THIS PATIENT: 36 minutes.    Note: This dictation was prepared with Dragon dictation along with smaller phrase technology. Any transcriptional errors that result from this process are unintentional.  Tekesha Almgren M.D on 03/13/2016 at 7:05 AM  Between 7am to 6pm - Pager - [phone] After 6pm go to [email or web address] - Social research officer, governmentpassword [email or web address] [email or web address]  [facility]  Office  [phone]  CC: Primary care physician; Barbette ReichmannHANDE,VISHWANATH, MD

## 2016-03-13 NOTE — Progress Notes (Signed)
Patient prefers to take all 10A medications when back home. Jeff [name]

## 2016-03-13 NOTE — Discharge Instructions (Signed)
Confusion Introduction Confusion is the inability to think with your usual speed or clarity. Confusion may come on quickly or slowly over time. How quickly the confusion comes on depends on the cause. Confusion can be due to any number of causes. What are the causes?  Concussion, head injury, or head trauma.  Seizures.  Stroke.  Fever.  Brain tumor.  Age related decreased brain function (dementia).  Heightened emotional states like rage or terror.  Mental illness in which the person loses the ability to determine what is real and what is not (hallucinations).  Infections such as a urinary tract infection (UTI).  Toxic effects from alcohol, drugs, or prescription medicines.  Dehydration and an imbalance of salts in the body (electrolytes).  Lack of sleep.  Low blood sugar (diabetes).  Low levels of oxygen from conditions such as chronic lung disorders.  Drug interactions or other medicine side effects.  Nutritional deficiencies, especially niacin, thiamine, vitamin C, or vitamin B.  Sudden drop in body temperature (hypothermia).  Change in routine, such as when traveling or hospitalized. What are the signs or symptoms? People often describe their thinking as cloudy or unclear when they are confused. Confusion can also include feeling disoriented. That means you are unaware of where or who you are. You may also not know what the date or time is. If confused, you may also have difficulty paying attention, remembering, and making decisions. Some people also act aggressively when they are confused. How is this diagnosed? The medical evaluation of confusion may include:  Blood and urine tests.  X-rays.  Brain and nervous system tests.  Analyzing your brain waves (electroencephalogram or EEG).  Magnetic resonance imaging (MRI) of your head.  Computed tomography (CT) scan of your head.  Mental status tests in which your health care provider may ask many questions.  Some of these questions may seem silly or strange, but they are a very important test to help diagnose and treat confusion. How is this treated? An admission to the hospital may not be needed, but a person with confusion should not be left alone. Stay with a family member or friend until the confusion clears. Avoid alcohol, pain relievers, or sedative drugs until you have fully recovered. Do not drive until directed by your health care provider. Follow these instructions at home: What family and friends can do:  To find out if someone is confused, ask the person to state his or her name, age, and the date. If the person is unsure or answers incorrectly, he or she is confused.  Always introduce yourself, no matter how well the person knows you.  Often remind the person of his or her location.  Place a calendar and clock near the confused person.  Help the person with his or her medicines. You may want to use a pill box, an alarm as a reminder, or give the person each dose as prescribed.  Talk about current events and plans for the day.  Try to keep the environment calm, quiet, and peaceful.  Make sure the person keeps follow-up visits with his or her health care provider. How is this prevented? Ways to prevent confusion:  Avoid alcohol.  Eat a balanced diet.  Get enough sleep.  Take medicine only as directed by your health care provider.  Do not become isolated. Spend time with other people and make plans for your days.  Keep careful watch on your blood sugar levels if you are diabetic. Get help right away if:    You develop severe headaches, repeated vomiting, seizures, blackouts, or slurred speech.  There is increasing confusion, weakness, numbness, restlessness, or personality changes.  You develop a loss of balance, have marked dizziness, feel uncoordinated, or fall.  You have delusions, hallucinations, or develop severe anxiety.  Your family members think you need to be  rechecked. This information is not intended to replace advice given to you by your health care provider. Make sure you discuss any questions you have with your health care provider. Document Released: 04/13/2004 Document Revised: 09/24/2015 Document Reviewed: 04/11/2013  2017 Elsevier  

## 2016-03-15 LAB — CULTURE, BLOOD (ROUTINE X 2)
Culture: NO GROWTH
Culture: NO GROWTH

## 2016-03-16 ENCOUNTER — Other Ambulatory Visit: Payer: Self-pay

## 2016-03-16 NOTE — Patient Outreach (Signed)
[facility] Care Management  03/16/2016  Jeff Key [birth year] [id number]   Telephone call to patient regarding transition of care follow up.  Unable to reach patient. HIPAA compliant voice message left with call back phone number .  PLAN; RNCM will attempt 2nd telephone call to patient within 1 week.   George InaDavina Kassidy Dockendorf RN,BSN,CCM [facility] Telephonic  [phone]

## 2016-03-21 ENCOUNTER — Other Ambulatory Visit: Payer: Self-pay

## 2016-03-21 DIAGNOSIS — J101 Influenza due to other identified influenza virus with other respiratory manifestations: Secondary | ICD-10-CM | POA: Diagnosis not present

## 2016-03-21 DIAGNOSIS — I2510 Atherosclerotic heart disease of native coronary artery without angina pectoris: Secondary | ICD-10-CM | POA: Diagnosis not present

## 2016-03-21 DIAGNOSIS — E871 Hypo-osmolality and hyponatremia: Secondary | ICD-10-CM | POA: Diagnosis not present

## 2016-03-21 DIAGNOSIS — Z8619 Personal history of other infectious and parasitic diseases: Secondary | ICD-10-CM | POA: Diagnosis not present

## 2016-03-21 NOTE — Patient Outreach (Signed)
[facility] Care Management  03/21/2016  Jeff Key July 15, 1945 [id number]   REFERRAL DATE; 03/15/17 REFERRAL SOURCE; Hospital Discharge from [facility] REFERRAL REASON:  Transition of care follow up CONSENT: HIPAA verified with patient. Patient gave verbal consent to speak with his wife, Jeff Key regarding all of his personal health information.   PROVIDERS: Dr. Barbette ReichmannVishwanath Hande  SOCIAL: Patient lives with spouse. Patient is independent in self care. Wife states patient requires assist/dependence with IADL'S  SUBJECTIVE; Telephone call to patient regarding transition of care follow up. Discussed transition of care follow up with patients wife, Jeff Key. Wife states patient is doing better since he has been out of the hospital. Wife states patient has gotten his strength back. States he is taking his last antibiotic pill today. Wife states patient saw his primary MD today. Denies any changes with patients current treatment plan. Wife states patient has all of  His medications and is able to afford them. Wife states she has to prepare patients pill box due to patient having some dementia. Wife states patient was diagnosed with dementia approximately 3 months ago.   Wife states patient has to have lab work done for his primary MD on 04/10/16 and will have his physical on 04/17/16. Wife states she provides patients transportation to his appointments.  Wife states patient continues on a heart healthy diet.  Denies patient having any new onset of symptoms.  Wife denies need for further Transition of care follow up calls. Wife states patient is doing ok.   ASSESSMENT: Status post hospital admission at [facility]. Discharged on 03/13/16.  Per medical record patient admitted for sepsis.   PLAN; RNCM will refer patient to care management assistant for closure due to refusal of additional transition of care calls. RNCM will notify patients  primary md  Of refusal of services.  RNCM will send patient/ spouse advance directive as discussed.   Jeff InaDavina Makiah Clauson RN,BSN,CCM [facility] Telephonic  [phone]    Jeff InaDavina Fajr Fife RN,BSN,CCM [facility] Telephonic  [phone]

## 2016-03-31 ENCOUNTER — Telehealth: Payer: Self-pay | Admitting: Cardiovascular Disease

## 2016-03-31 DIAGNOSIS — R310 Gross hematuria: Secondary | ICD-10-CM | POA: Diagnosis not present

## 2016-03-31 NOTE — Telephone Encounter (Signed)
Left message to call back  

## 2016-03-31 NOTE — Telephone Encounter (Signed)
Please call,question about his Brilinta.

## 2016-04-10 DIAGNOSIS — E8809 Other disorders of plasma-protein metabolism, not elsewhere classified: Secondary | ICD-10-CM | POA: Diagnosis not present

## 2016-04-10 DIAGNOSIS — L570 Actinic keratosis: Secondary | ICD-10-CM | POA: Diagnosis not present

## 2016-04-10 DIAGNOSIS — F0150 Vascular dementia without behavioral disturbance: Secondary | ICD-10-CM | POA: Diagnosis not present

## 2016-04-10 DIAGNOSIS — R634 Abnormal weight loss: Secondary | ICD-10-CM | POA: Diagnosis not present

## 2016-04-10 DIAGNOSIS — D6489 Other specified anemias: Secondary | ICD-10-CM | POA: Diagnosis not present

## 2016-04-10 DIAGNOSIS — K51311 Ulcerative (chronic) rectosigmoiditis with rectal bleeding: Secondary | ICD-10-CM | POA: Diagnosis not present

## 2016-04-10 DIAGNOSIS — E538 Deficiency of other specified B group vitamins: Secondary | ICD-10-CM | POA: Diagnosis not present

## 2016-04-10 DIAGNOSIS — Z23 Encounter for immunization: Secondary | ICD-10-CM | POA: Diagnosis not present

## 2016-04-10 DIAGNOSIS — Z0000 Encounter for general adult medical examination without abnormal findings: Secondary | ICD-10-CM | POA: Diagnosis not present

## 2016-04-10 DIAGNOSIS — R413 Other amnesia: Secondary | ICD-10-CM | POA: Diagnosis not present

## 2016-04-12 NOTE — Telephone Encounter (Signed)
msg left for patient to call. 

## 2016-04-17 DIAGNOSIS — F0280 Dementia in other diseases classified elsewhere without behavioral disturbance: Secondary | ICD-10-CM | POA: Diagnosis not present

## 2016-04-17 DIAGNOSIS — F0150 Vascular dementia without behavioral disturbance: Secondary | ICD-10-CM | POA: Diagnosis not present

## 2016-04-17 DIAGNOSIS — Z87448 Personal history of other diseases of urinary system: Secondary | ICD-10-CM | POA: Diagnosis not present

## 2016-04-17 DIAGNOSIS — I2510 Atherosclerotic heart disease of native coronary artery without angina pectoris: Secondary | ICD-10-CM | POA: Diagnosis not present

## 2016-04-17 DIAGNOSIS — E871 Hypo-osmolality and hyponatremia: Secondary | ICD-10-CM | POA: Diagnosis not present

## 2016-04-17 DIAGNOSIS — D649 Anemia, unspecified: Secondary | ICD-10-CM | POA: Diagnosis not present

## 2016-04-17 DIAGNOSIS — G309 Alzheimer's disease, unspecified: Secondary | ICD-10-CM | POA: Diagnosis not present

## 2016-04-17 DIAGNOSIS — K51311 Ulcerative (chronic) rectosigmoiditis with rectal bleeding: Secondary | ICD-10-CM | POA: Diagnosis not present

## 2016-05-16 ENCOUNTER — Ambulatory Visit: Admit: 2016-05-16 | Payer: PPO | Admitting: Gastroenterology

## 2016-05-16 SURGERY — COLONOSCOPY WITH PROPOFOL
Anesthesia: General

## 2016-05-18 ENCOUNTER — Other Ambulatory Visit: Payer: Self-pay

## 2016-05-18 ENCOUNTER — Ambulatory Visit (HOSPITAL_COMMUNITY): Payer: PPO | Attending: Cardiology

## 2016-05-18 ENCOUNTER — Ambulatory Visit (HOSPITAL_COMMUNITY)
Admission: RE | Admit: 2016-05-18 | Discharge: 2016-05-18 | Disposition: A | Discharge status: Home / Self Care | Payer: PPO | Source: Ambulatory Visit | Attending: Cardiology | Admitting: Cardiology

## 2016-05-18 DIAGNOSIS — I255 Ischemic cardiomyopathy: Secondary | ICD-10-CM

## 2016-05-18 DIAGNOSIS — I2510 Atherosclerotic heart disease of native coronary artery without angina pectoris: Secondary | ICD-10-CM | POA: Diagnosis not present

## 2016-05-18 DIAGNOSIS — I724 Aneurysm of artery of lower extremity: Secondary | ICD-10-CM | POA: Insufficient documentation

## 2016-05-18 DIAGNOSIS — Z87891 Personal history of nicotine dependence: Secondary | ICD-10-CM | POA: Insufficient documentation

## 2016-05-23 ENCOUNTER — Encounter: Payer: Self-pay | Admitting: Cardiovascular Disease

## 2016-05-23 ENCOUNTER — Ambulatory Visit (INDEPENDENT_AMBULATORY_CARE_PROVIDER_SITE_OTHER): Payer: PPO | Admitting: Cardiovascular Disease

## 2016-05-23 VITALS — BP 138/66 | HR 58 | Ht 70.0 in | Wt 204.0 lb

## 2016-05-23 DIAGNOSIS — I255 Ischemic cardiomyopathy: Secondary | ICD-10-CM

## 2016-05-23 DIAGNOSIS — Z79899 Other long term (current) drug therapy: Secondary | ICD-10-CM

## 2016-05-23 DIAGNOSIS — I472 Ventricular tachycardia: Secondary | ICD-10-CM

## 2016-05-23 DIAGNOSIS — E785 Hyperlipidemia, unspecified: Secondary | ICD-10-CM | POA: Diagnosis not present

## 2016-05-23 DIAGNOSIS — I2119 ST elevation (STEMI) myocardial infarction involving other coronary artery of inferior wall: Secondary | ICD-10-CM | POA: Diagnosis not present

## 2016-05-23 DIAGNOSIS — I4729 Other ventricular tachycardia: Secondary | ICD-10-CM

## 2016-05-23 NOTE — Progress Notes (Signed)
05/23/2016 Jeff Key   09-12-1945  [id number]  Primary Physician [name], MD Primary Cardiologist: Runell Gess MD Roseanne Reno  HPI:   Jeff Key is a 71 year old married Caucasian male father of 3, grandfather and 6 grandchildren who lives in [place] I last saw him in the office 02/23/16. He has newly diagnosed Alzheimer's disease and was recently admitted to [facility] in September for pneumonia. He was brought into the ER on Thanksgiving day with nonspecific symptoms however he was complaining of chest pain apparently and an EKG showed inferior ST segment elevation. He was transferred by EMS to come in for angiography and urgent intervention. He had a totally occluded proximal dominant RCA stenosis which I stented using a synergy drug-eluting stent. He had residual mid LAD and circumflex disease. His EF was mildly depressed. Treated with aspirin and Brilenta has remained stable. His troponin peaked at 40. He underwent staged LAD and circumflex PCI and drug-eluting stenting. Right femoral approach on 02/15/16 and this was uncomplicated. He was discharged on the following day. Apparently he was seen in early follow-up in the office and a bruit was noted. He had a small pseudoaneurysm by duplex ultrasound performed 02/22/16. Follow-up ultrasound recently performed showed that this had spontaneously occluded. His EF likewise has somewhat improved from the 40-45% range back in November up to 45-50% recently by 2-D echocardiogram.   Current Outpatient Prescriptions  Medication Sig Dispense Refill  . amoxicillin-clavulanate (AUGMENTIN) 875-125 MG tablet Take 1 tablet by mouth 2 (two) times daily. Spouse states patient takes last pill on today 03/21/16    . aspirin EC 81 MG tablet Take 81 mg by mouth daily.    Marland Kitchen atorvastatin (LIPITOR) 80 MG tablet Take 1 tablet (80 mg total) by mouth daily at 6 PM. 90 tablet 3  . ferrous sulfate 325 (65 FE) MG tablet Take 325 mg by  mouth daily with breakfast.    . galantamine (RAZADYNE) 4 MG tablet Take 4 mg by mouth 2 (two) times daily with a meal.     . lisinopril (PRINIVIL,ZESTRIL) 10 MG tablet Take 1 tablet (10 mg total) by mouth daily. 30 tablet 3  . metoprolol tartrate (LOPRESSOR) 25 MG tablet Take 1 tablet (25 mg total) by mouth 2 (two) times daily. 60 tablet 3  . nitroGLYCERIN (NITROSTAT) 0.4 MG SL tablet Place 1 tablet (0.4 mg total) under the tongue every 5 (five) minutes as needed for chest pain. 25 tablet 12  . omeprazole (PRILOSEC) 20 MG capsule Take 20 mg by mouth daily.    Marland Kitchen sulfaSALAzine (AZULFIDINE) 500 MG EC tablet Take 1,000 mg by mouth 2 (two) times daily.    . ticagrelor (BRILINTA) 90 MG TABS tablet Take 1 tablet (90 mg total) by mouth 2 (two) times daily. 180 tablet 3  . vitamin B-12 (CYANOCOBALAMIN) 1000 MCG tablet Take 1,000 mcg by mouth daily.     No current facility-administered medications for this visit.     No Known Allergies  Social History   Social History  . Marital status: Married    Spouse name: N/A  . Number of children: N/A  . Years of education: N/A   Occupational History  . Not on file.   Social History Main Topics  . Smoking status: Former Smoker    Packs/day: 2.00    Years: 30.00    Types: Cigarettes    Quit date: 78  . Smokeless tobacco: Never Used  . Alcohol use No  .  Drug use: No  . Sexual activity: No   Other Topics Concern  . Not on file   Social History Narrative  . No narrative on file     Review of Systems: General: negative for chills, fever, night sweats or weight changes.  Cardiovascular: negative for chest pain, dyspnea on exertion, edema, orthopnea, palpitations, paroxysmal nocturnal dyspnea or shortness of breath Dermatological: negative for rash Respiratory: negative for cough or wheezing Urologic: negative for hematuria Abdominal: negative for nausea, vomiting, diarrhea, bright red blood per rectum, melena, or hematemesis Neurologic:  negative for visual changes, syncope, or dizziness All other systems reviewed and are otherwise negative except as noted above.    Blood pressure 138/66, pulse (!) 58, height 5\' 10"  (1.778 m), weight 204 lb (92.5 kg), SpO2 95 %.  General appearance: alert and no distress Neck: no adenopathy, no carotid bruit, no JVD, supple, symmetrical, trachea midline and thyroid not enlarged, symmetric, no tenderness/mass/nodules Lungs: clear to auscultation bilaterally Heart: regular rate and rhythm, S1, S2 normal, no murmur, click, rub or gallop Extremities: extremities normal, atraumatic, no cyanosis or edema  EKG not performed today  ASSESSMENT AND PLAN:   STEMI involving oth coronary artery of inferior wall (HCC) History of inferior STEMI on 02/10/16 with PCI and drug-eluting stenting of his occluded RCA with a Synergy drug-eluting stent. He did have residual LAD and circumflex disease which were intervened on in a staged fashion for days later. The right femoral approach. He has a small pseudoaneurysm after this which has since been shown to have close by duplex ultrasound this month. He remains on dual antiplatelet therapy including aspirin and Proventil. He is asymptomatic.  Ischemic cardiomyopathy History of ischemic cardiomyopathy with an EF in the 40-45% range by 2-D echo in November at the time of his STEMI. Recent 2-D echo showed his EF has improved up to 45-50%.  NSVT (nonsustained ventricular tachycardia) (HCC) History of nonsustained ventricular tachycardia at the time of his appears to me. He is on amiodarone 2 mg a day. Since it's been 3 months since his index event and his LV function has improved I'm going to stop his amiodarone.      Runell GessJonathan J. Laverle Pillard MD FACP,FACC,FAHA, [facility] 05/23/2016 10:50 AM

## 2016-05-23 NOTE — Addendum Note (Signed)
Addended by: Evans LanceSTOVER, Lilymae Swiech W on: 05/23/2016 10:53 AM   Modules accepted: Orders

## 2016-05-23 NOTE — Patient Instructions (Signed)
Medication Instructions: STOP Amiodarone   Labwork: Your physician recommends that you return for a FASTING lipid profile and hepatic function panel.   Follow-Up: We request that you follow-up in: 6 months with an extender and in 12 months with Dr San MorelleBerry  You will receive a reminder letter in the mail two months in advance. If you don't receive a letter, please call our office to schedule the follow-up appointment.  If you need a refill on your cardiac medications before your next appointment, please call your pharmacy.

## 2016-05-23 NOTE — Assessment & Plan Note (Signed)
History of inferior STEMI on 02/10/16 with PCI and drug-eluting stenting of his occluded RCA with a Synergy drug-eluting stent. He did have residual LAD and circumflex disease which were intervened on in a staged fashion for days later. The right femoral approach. He has a small pseudoaneurysm after this which has since been shown to have close by duplex ultrasound this month. He remains on dual antiplatelet therapy including aspirin and Proventil. He is asymptomatic.

## 2016-05-23 NOTE — Assessment & Plan Note (Signed)
History of nonsustained ventricular tachycardia at the time of his appears to me. He is on amiodarone 2 mg a day. Since it's been 3 months since his index event and his LV function has improved I'm going to stop his amiodarone.

## 2016-05-23 NOTE — Assessment & Plan Note (Signed)
History of ischemic cardiomyopathy with an EF in the 40-45% range by 2-D echo in November at the time of his STEMI. Recent 2-D echo showed his EF has improved up to 45-50%.

## 2016-05-26 DIAGNOSIS — Z79899 Other long term (current) drug therapy: Secondary | ICD-10-CM | POA: Diagnosis not present

## 2016-05-26 DIAGNOSIS — E785 Hyperlipidemia, unspecified: Secondary | ICD-10-CM | POA: Diagnosis not present

## 2016-05-27 LAB — HEPATIC FUNCTION PANEL
ALK PHOS: 219 IU/L — AB (ref 39–117)
ALT: 36 IU/L (ref 0–44)
AST: 25 IU/L (ref 0–40)
Albumin: 4.4 g/dL (ref 3.5–4.8)
Bilirubin Total: 0.3 mg/dL (ref 0.0–1.2)
Bilirubin, Direct: 0.14 mg/dL (ref 0.00–0.40)
Total Protein: 6.9 g/dL (ref 6.0–8.5)

## 2016-05-27 LAB — LIPID PANEL
CHOLESTEROL TOTAL: 125 mg/dL (ref 100–199)
Chol/HDL Ratio: 2.5 ratio units (ref 0.0–5.0)
HDL: 50 mg/dL (ref >39)
LDL Calculated: 63 mg/dL (ref 0–99)
Triglycerides: 60 mg/dL (ref 0–149)
VLDL CHOLESTEROL CAL: 12 mg/dL (ref 5–40)

## 2016-05-29 ENCOUNTER — Other Ambulatory Visit: Payer: Self-pay | Admitting: Cardiovascular Disease

## 2016-05-29 ENCOUNTER — Encounter: Payer: Self-pay | Admitting: Cardiovascular Disease

## 2016-05-29 ENCOUNTER — Telehealth: Payer: Self-pay | Admitting: Cardiovascular Disease

## 2016-05-29 DIAGNOSIS — Z5181 Encounter for therapeutic drug level monitoring: Secondary | ICD-10-CM

## 2016-05-29 DIAGNOSIS — E785 Hyperlipidemia, unspecified: Secondary | ICD-10-CM

## 2016-05-29 DIAGNOSIS — Z79899 Other long term (current) drug therapy: Principal | ICD-10-CM

## 2016-05-29 NOTE — Telephone Encounter (Signed)
Lipid Profile  Order: [id number]  Status:  Final result Visible to patient:  No (Not Released) Dx:  On statin therapy; Hyperlipidemia, un...  Notes Recorded by  W Stover on 05/29/2016 at 9:07 AM EDT Letter with results mailed to pt. Repeat labs ordered and slips mailed to pt attached to letter. ------  Notes Recorded by Jonathan J Berry, MD on 05/28/2016 at 4:06 PM EDT Excellent FLP and LFTs except for elevated alk phos. Repeat LFTs 3 months    

## 2016-05-30 ENCOUNTER — Telehealth: Payer: Self-pay | Admitting: Cardiovascular Disease

## 2016-05-30 NOTE — Telephone Encounter (Signed)
Patient wife is calling for Cholesterol results, please call to discuss. Thanks.

## 2016-05-30 NOTE — Telephone Encounter (Signed)
Returned call-made aware of results and recommendations-advised these were mailed out to them as well as repeat lab slips.  Verbalized understanding.

## 2016-05-31 ENCOUNTER — Telehealth: Payer: Self-pay | Admitting: Cardiovascular Disease

## 2016-05-31 NOTE — Telephone Encounter (Signed)
New message      Pt c/o medication issue:  1. Name of Medication:  lipitor  2. How are you currently taking this medication (dosage and times per day)? 80mg  1x day  3. Are you having a reaction (difficulty breathing--STAT)? no 4. What is your medication issue?  Pt wife wants to talk to rn about the generic for Lipitor she believes its causing more than normal issues for his memory

## 2016-05-31 NOTE — Telephone Encounter (Signed)
Returned call to patient's wife. She states he has gotten "so much worse" since going on Lipitor. His memory is worse and confusion is worse. Patient was driving prior to starting Lipitor but now cannot even drive d/t memory & confusion  Patient was seen on 05/23/16 - wife states she mentioned this to MD but MD told her it was likely unrelated.   Explained to wife that patient would need to be on statin therapy given h/o MI  Unsure if a different statin would have less of a side effect profile - will inquire of clinical pharmacy staff for recommendations.

## 2016-05-31 NOTE — Telephone Encounter (Signed)
Advised wife, verbalized understanding.  

## 2016-05-31 NOTE — Telephone Encounter (Signed)
Hold atorvastatin for 2-3 weeks, see if improvements in memory.  If so, please call back and we will consider other options for lowering cholesterol.

## 2016-06-08 ENCOUNTER — Encounter: Payer: Self-pay | Admitting: Cardiovascular Disease

## 2016-06-10 ENCOUNTER — Other Ambulatory Visit: Payer: Self-pay | Admitting: Physician Assistant

## 2016-06-12 NOTE — Telephone Encounter (Signed)
Rx(s) sent to pharmacy electronically.  

## 2016-06-15 ENCOUNTER — Other Ambulatory Visit: Payer: Self-pay | Admitting: Physician Assistant

## 2016-06-29 ENCOUNTER — Telehealth: Payer: Self-pay | Admitting: Cardiovascular Disease

## 2016-06-29 DIAGNOSIS — E785 Hyperlipidemia, unspecified: Secondary | ICD-10-CM

## 2016-06-29 NOTE — Telephone Encounter (Signed)
Pt says she is calling back to give an update on how pt did when he stopped his Atorvastatin.

## 2016-06-29 NOTE — Telephone Encounter (Signed)
Returned phone call. No answer.

## 2016-07-03 NOTE — Telephone Encounter (Signed)
Spoke with pt wife, she reports there was no change in the patients symptoms since it was stopped. She is going to restart the lipitor. She is also requesting the lab orders be changed to [facility] so it can be drawn in [place]. Lab orders mailed to the pt

## 2016-07-03 NOTE — Telephone Encounter (Signed)
Returning a call from [place] from last week,concerning pt's medicine.

## 2016-07-06 DIAGNOSIS — G309 Alzheimer's disease, unspecified: Secondary | ICD-10-CM | POA: Diagnosis not present

## 2016-07-06 DIAGNOSIS — F0150 Vascular dementia without behavioral disturbance: Secondary | ICD-10-CM | POA: Diagnosis not present

## 2016-07-06 DIAGNOSIS — F0280 Dementia in other diseases classified elsewhere without behavioral disturbance: Secondary | ICD-10-CM | POA: Diagnosis not present

## 2016-08-10 DIAGNOSIS — E538 Deficiency of other specified B group vitamins: Secondary | ICD-10-CM | POA: Diagnosis not present

## 2016-08-10 DIAGNOSIS — E871 Hypo-osmolality and hyponatremia: Secondary | ICD-10-CM | POA: Diagnosis not present

## 2016-08-10 DIAGNOSIS — D649 Anemia, unspecified: Secondary | ICD-10-CM | POA: Diagnosis not present

## 2016-08-10 DIAGNOSIS — K51311 Ulcerative (chronic) rectosigmoiditis with rectal bleeding: Secondary | ICD-10-CM | POA: Diagnosis not present

## 2016-08-10 DIAGNOSIS — I2510 Atherosclerotic heart disease of native coronary artery without angina pectoris: Secondary | ICD-10-CM | POA: Diagnosis not present

## 2016-08-10 DIAGNOSIS — Z87448 Personal history of other diseases of urinary system: Secondary | ICD-10-CM | POA: Diagnosis not present

## 2016-08-10 DIAGNOSIS — F0280 Dementia in other diseases classified elsewhere without behavioral disturbance: Secondary | ICD-10-CM | POA: Diagnosis not present

## 2016-08-10 DIAGNOSIS — G309 Alzheimer's disease, unspecified: Secondary | ICD-10-CM | POA: Diagnosis not present

## 2016-08-10 DIAGNOSIS — F0150 Vascular dementia without behavioral disturbance: Secondary | ICD-10-CM | POA: Diagnosis not present

## 2016-08-15 DIAGNOSIS — Z0000 Encounter for general adult medical examination without abnormal findings: Secondary | ICD-10-CM | POA: Diagnosis not present

## 2016-08-15 DIAGNOSIS — I2510 Atherosclerotic heart disease of native coronary artery without angina pectoris: Secondary | ICD-10-CM | POA: Diagnosis not present

## 2016-08-15 DIAGNOSIS — F0150 Vascular dementia without behavioral disturbance: Secondary | ICD-10-CM | POA: Diagnosis not present

## 2016-08-15 DIAGNOSIS — F0280 Dementia in other diseases classified elsewhere without behavioral disturbance: Secondary | ICD-10-CM | POA: Diagnosis not present

## 2016-08-15 DIAGNOSIS — D649 Anemia, unspecified: Secondary | ICD-10-CM | POA: Diagnosis not present

## 2016-08-15 DIAGNOSIS — K51311 Ulcerative (chronic) rectosigmoiditis with rectal bleeding: Secondary | ICD-10-CM | POA: Diagnosis not present

## 2016-08-15 DIAGNOSIS — G309 Alzheimer's disease, unspecified: Secondary | ICD-10-CM | POA: Diagnosis not present

## 2016-11-02 DIAGNOSIS — H2513 Age-related nuclear cataract, bilateral: Secondary | ICD-10-CM | POA: Diagnosis not present

## 2016-11-03 DIAGNOSIS — F0150 Vascular dementia without behavioral disturbance: Secondary | ICD-10-CM | POA: Diagnosis not present

## 2016-11-03 DIAGNOSIS — G309 Alzheimer's disease, unspecified: Secondary | ICD-10-CM | POA: Diagnosis not present

## 2016-11-03 DIAGNOSIS — F0280 Dementia in other diseases classified elsewhere without behavioral disturbance: Secondary | ICD-10-CM | POA: Diagnosis not present

## 2016-12-11 ENCOUNTER — Encounter: Payer: Self-pay | Admitting: Cardiology

## 2016-12-11 ENCOUNTER — Ambulatory Visit (INDEPENDENT_AMBULATORY_CARE_PROVIDER_SITE_OTHER): Payer: PPO | Admitting: Cardiology

## 2016-12-11 VITALS — BP 126/73 | HR 54 | Ht 72.0 in | Wt 214.8 lb

## 2016-12-11 DIAGNOSIS — I255 Ischemic cardiomyopathy: Secondary | ICD-10-CM | POA: Diagnosis not present

## 2016-12-11 DIAGNOSIS — G300 Alzheimer's disease with early onset: Secondary | ICD-10-CM

## 2016-12-11 DIAGNOSIS — I724 Aneurysm of artery of lower extremity: Secondary | ICD-10-CM | POA: Diagnosis not present

## 2016-12-11 DIAGNOSIS — F0280 Dementia in other diseases classified elsewhere without behavioral disturbance: Secondary | ICD-10-CM | POA: Insufficient documentation

## 2016-12-11 DIAGNOSIS — I2510 Atherosclerotic heart disease of native coronary artery without angina pectoris: Secondary | ICD-10-CM | POA: Diagnosis not present

## 2016-12-11 DIAGNOSIS — I2119 ST elevation (STEMI) myocardial infarction involving other coronary artery of inferior wall: Secondary | ICD-10-CM

## 2016-12-11 DIAGNOSIS — G309 Alzheimer's disease, unspecified: Secondary | ICD-10-CM

## 2016-12-11 MED ORDER — METOPROLOL TARTRATE 25 MG PO TABS: 12.5000 mg | ORAL_TABLET | Freq: Two times a day (BID) | ORAL | 6 refills | Status: DC

## 2016-12-11 NOTE — Patient Instructions (Signed)
Medication Instructions: Your physician recommends that you continue on your current medications as directed. Please refer to the Current Medication list given to you today.  Decrease your Metoprolol to 12.5 mg twice daily.   Follow-Up: Your physician wants you to follow-up in: 6 months with Dr. Allyson Sabal. You will receive a reminder letter in the mail two months in advance. If you don't receive a letter, please call our office to schedule the follow-up appointment.  If you need a refill on your cardiac medications before your next appointment, please call your pharmacy.

## 2016-12-11 NOTE — Assessment & Plan Note (Signed)
Right common femoral pseudoaneurysm-resolved March 2018 [place]

## 2016-12-11 NOTE — Assessment & Plan Note (Signed)
a. 01/2016: EF 40-45%. -f/u 45-50% March 2018

## 2016-12-11 NOTE — Progress Notes (Signed)
12/11/2016 Jeff Key   March 26, 1945  [id number]  Primary Physician [name], MD Primary Cardiologist: Dr Allyson Sabal  HPI:  71 y/o male with s history of Alzheimer's, lives at home with family, admitted with a STEMI 02/09/17 as a transfer from [facility]. He had an occluded RCA as well as LAD and CFX disease. He had an urgent RCA intervention followed by CFX and LAD staged intervention 02/13/17. His EF was 40% but this had improved to 45-50% March 2018.   He is in the office today for 6 month follow up. He is pleasant, groomed, and well dressed. He has no recollection of his MI or hospitalization-"I never go to the doctor". Hi wife says she has noticed some DOE and early fatigue. His HR is in the low 50's. She has not noticed any chest pain. His PCP cut the pt's Lipitor back to 10 mg (? secondary to possible worsening memory deficit).    Current Outpatient Prescriptions  Medication Sig Dispense Refill  . aspirin EC 81 MG tablet Take 81 mg by mouth daily.    Marland Kitchen atorvastatin (LIPITOR) 10 MG tablet Take 10 mg by mouth daily.    . ferrous sulfate 325 (65 FE) MG tablet Take 325 mg by mouth daily with breakfast.    . galantamine (RAZADYNE) 4 MG tablet Take 4 mg by mouth 2 (two) times daily with a meal.     . lisinopril (PRINIVIL,ZESTRIL) 10 MG tablet Take 1 tablet (10 mg total) by mouth daily. 90 tablet 3  . metoprolol tartrate (LOPRESSOR) 25 MG tablet Take 0.5 tablets (12.5 mg total) by mouth 2 (two) times daily. 30 tablet 6  . nitroGLYCERIN (NITROSTAT) 0.4 MG SL tablet Place 1 tablet (0.4 mg total) under the tongue every 5 (five) minutes as needed for chest pain. 25 tablet 12  . omeprazole (PRILOSEC) 20 MG capsule Take 20 mg by mouth daily.    Marland Kitchen sulfaSALAzine (AZULFIDINE) 500 MG EC tablet Take 1,000 mg by mouth 2 (two) times daily.    . ticagrelor (BRILINTA) 90 MG TABS tablet Take 1 tablet (90 mg total) by mouth 2 (two) times daily. 180 tablet 3  . vitamin B-12 (CYANOCOBALAMIN) 1000 MCG  tablet Take 1,000 mcg by mouth daily.     No current facility-administered medications for this visit.     No Known Allergies  Past Medical History:  Diagnosis Date  . Anemia ~ 11/2015  . CAD (coronary artery disease)    a. inf STEMI on 02/10/16. RCA treated with DES followed by staged PCI of LAD and Circumflex 02/14/16.  Marland Kitchen Dementia dx'd 01/2016  . GERD (gastroesophageal reflux disease)   . History of stomach ulcers 1980s?  . Ischemic cardiomyopathy    a. 01/2016: EF 40-45%.     Social History   Social History  . Marital status: Married    Spouse name: N/A  . Number of children: N/A  . Years of education: N/A   Occupational History  . Not on file.   Social History Main Topics  . Smoking status: Former Smoker    Packs/day: 2.00    Years: 30.00    Types: Cigarettes    Quit date: 24  . Smokeless tobacco: Never Used  . Alcohol use No  . Drug use: No  . Sexual activity: No   Other Topics Concern  . Not on file   Social History Narrative  . No narrative on file     Family History  Problem Relation Age  of Onset  . Prostate cancer Father   . Bladder Cancer Neg Hx   . Kidney cancer Neg Hx      Review of Systems: General: negative for chills, fever, night sweats or weight changes.  Cardiovascular: negative for chest pain, dyspnea on exertion, edema, orthopnea, palpitations, paroxysmal nocturnal dyspnea or shortness of breath Dermatological: negative for rash Respiratory: negative for cough or wheezing Urologic: negative for hematuria Abdominal: negative for nausea, vomiting, diarrhea, bright red blood per rectum, melena, or hematemesis Neurologic: negative for visual changes, syncope, or dizziness All other systems reviewed and are otherwise negative except as noted above.    Blood pressure 126/73, pulse (!) 54, height 6' (1.829 m), weight 214 lb 12.8 oz (97.4 kg).  General appearance: alert, cooperative, no distress and mildly obese Neck: no carotid bruit  and no JVD Lungs: clear to auscultation bilaterally Heart: regular rate and rhythm Extremities: extremities normal, atraumatic, no cyanosis or edema Skin: Skin color, texture, turgor normal. No rashes or lesions Neurologic: Grossly normal  EKG NSR, SB, TWI ,3 F  ASSESSMENT AND PLAN:   CAD (coronary artery disease) a. inf STEMI on 02/10/16. RCA treated with DES followed by staged PCI of LAD and Circumflex 02/14/16.  Ischemic cardiomyopathy a. 01/2016: EF 40-45%. -f/u 45-50% March 2018  Pseudoaneurysm of right femoral artery [facility]) Right common femoral pseudoaneurysm-resolved March 2018 [place]  Alzheimer's dementia without behavioral disturbance .   PLAN  F/U with Dr Allyson Sabal in 6 months. I did suggest he cut his Metoprolol back to 12.5 mg BID to see if this would help his exertional fatigue.   Corine Shelter PA-C 12/11/2016 11:08 AM

## 2016-12-11 NOTE — Assessment & Plan Note (Signed)
a. inf STEMI on 02/10/16. RCA treated with DES followed by staged PCI of LAD and Circumflex 02/14/16.

## 2017-02-05 DIAGNOSIS — K51311 Ulcerative (chronic) rectosigmoiditis with rectal bleeding: Secondary | ICD-10-CM | POA: Diagnosis not present

## 2017-02-05 DIAGNOSIS — G309 Alzheimer's disease, unspecified: Secondary | ICD-10-CM | POA: Diagnosis not present

## 2017-02-05 DIAGNOSIS — D649 Anemia, unspecified: Secondary | ICD-10-CM | POA: Diagnosis not present

## 2017-02-05 DIAGNOSIS — Z125 Encounter for screening for malignant neoplasm of prostate: Secondary | ICD-10-CM | POA: Diagnosis not present

## 2017-02-05 DIAGNOSIS — F0280 Dementia in other diseases classified elsewhere without behavioral disturbance: Secondary | ICD-10-CM | POA: Diagnosis not present

## 2017-02-05 DIAGNOSIS — F0150 Vascular dementia without behavioral disturbance: Secondary | ICD-10-CM | POA: Diagnosis not present

## 2017-02-05 DIAGNOSIS — I2510 Atherosclerotic heart disease of native coronary artery without angina pectoris: Secondary | ICD-10-CM | POA: Diagnosis not present

## 2017-03-02 DIAGNOSIS — G309 Alzheimer's disease, unspecified: Secondary | ICD-10-CM | POA: Diagnosis not present

## 2017-03-02 DIAGNOSIS — K51311 Ulcerative (chronic) rectosigmoiditis with rectal bleeding: Secondary | ICD-10-CM | POA: Diagnosis not present

## 2017-03-02 DIAGNOSIS — Z0000 Encounter for general adult medical examination without abnormal findings: Secondary | ICD-10-CM | POA: Diagnosis not present

## 2017-03-02 DIAGNOSIS — F0150 Vascular dementia without behavioral disturbance: Secondary | ICD-10-CM | POA: Diagnosis not present

## 2017-03-02 DIAGNOSIS — F0280 Dementia in other diseases classified elsewhere without behavioral disturbance: Secondary | ICD-10-CM | POA: Diagnosis not present

## 2017-03-02 DIAGNOSIS — R413 Other amnesia: Secondary | ICD-10-CM | POA: Diagnosis not present

## 2017-03-02 DIAGNOSIS — I2510 Atherosclerotic heart disease of native coronary artery without angina pectoris: Secondary | ICD-10-CM | POA: Diagnosis not present

## 2017-03-12 ENCOUNTER — Other Ambulatory Visit: Payer: Self-pay | Admitting: Physician Assistant

## 2017-04-25 DIAGNOSIS — M545 Low back pain: Secondary | ICD-10-CM | POA: Diagnosis not present

## 2017-04-25 DIAGNOSIS — R8290 Unspecified abnormal findings in urine: Secondary | ICD-10-CM | POA: Diagnosis not present

## 2017-05-07 DIAGNOSIS — F0150 Vascular dementia without behavioral disturbance: Secondary | ICD-10-CM | POA: Diagnosis not present

## 2017-05-07 DIAGNOSIS — F0280 Dementia in other diseases classified elsewhere without behavioral disturbance: Secondary | ICD-10-CM | POA: Diagnosis not present

## 2017-05-07 DIAGNOSIS — G309 Alzheimer's disease, unspecified: Secondary | ICD-10-CM | POA: Diagnosis not present

## 2017-05-22 ENCOUNTER — Ambulatory Visit: Payer: PPO | Admitting: Cardiovascular Disease

## 2017-06-11 ENCOUNTER — Other Ambulatory Visit: Payer: Self-pay | Admitting: Cardiovascular Disease

## 2017-06-15 ENCOUNTER — Other Ambulatory Visit: Payer: Self-pay

## 2017-06-15 ENCOUNTER — Ambulatory Visit (INDEPENDENT_AMBULATORY_CARE_PROVIDER_SITE_OTHER): Payer: PPO | Admitting: Cardiovascular Disease

## 2017-06-15 ENCOUNTER — Encounter: Payer: Self-pay | Admitting: Cardiovascular Disease

## 2017-06-15 VITALS — BP 135/80 | HR 77 | Ht 70.0 in | Wt 222.2 lb

## 2017-06-15 DIAGNOSIS — Z7902 Long term (current) use of antithrombotics/antiplatelets: Secondary | ICD-10-CM

## 2017-06-15 DIAGNOSIS — I255 Ischemic cardiomyopathy: Secondary | ICD-10-CM | POA: Diagnosis not present

## 2017-06-15 DIAGNOSIS — E785 Hyperlipidemia, unspecified: Secondary | ICD-10-CM | POA: Insufficient documentation

## 2017-06-15 DIAGNOSIS — Z5181 Encounter for therapeutic drug level monitoring: Secondary | ICD-10-CM | POA: Diagnosis not present

## 2017-06-15 DIAGNOSIS — I2119 ST elevation (STEMI) myocardial infarction involving other coronary artery of inferior wall: Secondary | ICD-10-CM | POA: Diagnosis not present

## 2017-06-15 MED ORDER — METOPROLOL TARTRATE 25 MG PO TABS: 12.5000 mg | ORAL_TABLET | Freq: Two times a day (BID) | ORAL | 6 refills | Status: DC

## 2017-06-15 MED ORDER — CLOPIDOGREL BISULFATE 75 MG PO TABS: 75.0000 mg | ORAL_TABLET | Freq: Every day | ORAL | 3 refills | Status: DC

## 2017-06-15 NOTE — Patient Instructions (Signed)
Medication Instructions:  Finish your remaining Brilinta   Then START Plavix 75 mg.  Labwork: Your physician recommends that you return for lab work in: 2-3 weeks after starting Plavix at [facility].   Follow-Up: Your physician wants you to follow-up in: 1 year with Dr. Allyson SabalBerry. You will receive a reminder letter in the mail two months in advance. If you don't receive a letter, please call our office to schedule the follow-up appointment.  If you need a refill on your cardiac medications before your next appointment, please call your pharmacy.

## 2017-06-15 NOTE — Progress Notes (Signed)
06/15/2017 Jeff DallasRoger H Markwell   1945-07-13  [id number]  Primary Physician Barbette ReichmannHande, Vishwanath, MD Primary Cardiologist: Runell GessJonathan J Berry MD Milagros LollFACP, FACC, [place], [place]  HPI:  Jeff Key is a 72 y.o.  Caucasian male father of 463, grandfather and 6 grandchildren who [place] in [place] I last saw him in the office 05/23/16. He has newly diagnosed Alzheimer's disease and was recently admitted to [facility] in September for pneumonia. He was brought into the ER on Thanksgiving day with nonspecific symptoms however he was complaining of chest pain apparently and an EKG showed inferior ST segment elevation. He was transferred by EMS to come in for angiography and urgent intervention. He had a totally occluded proximal dominant RCA stenosis which I stented using a synergy drug-eluting stent. He had residual mid LAD and circumflex disease. His EF was mildly depressed. Treated with aspirin and Brilenta has remained stable. His troponin peaked at 40. He underwent staged LAD and circumflex PCI and drug-eluting stenting. Right femoral approach on 02/15/16 and this was uncomplicated. He was discharged on the following day. Apparently he was seen in early follow-up in the office and a bruit was noted. He had a small pseudoaneurysm by duplex ultrasound performed 02/22/16. Follow-up ultrasound recently performed showed that this had spontaneously occluded. His EF likewise has somewhat improved from the 40-45% range back in November up to 45-50% recently by 2-D echocardiogram.  Since I saw me or go his remain asymptomatic. He denies chest pain or shortness of breath. He remains on dual antiplatelet therapy with Brilenta.   Current Meds  Medication Sig  . aspirin EC 81 MG tablet Take 81 mg by mouth daily.  Marland Kitchen. atorvastatin (LIPITOR) 10 MG tablet Take 10 mg by mouth daily.  Marland Kitchen. BRILINTA 90 MG TABS tablet TAKE ONE TABLET BY MOUTH TWICE DAILY  . ferrous sulfate 325 (65 FE) MG tablet Take 325 mg by mouth daily with breakfast.   . lisinopril (PRINIVIL,ZESTRIL) 10 MG tablet TAKE 1 TABLET BY MOUTH ONCE DAILY  . memantine (NAMENDA) 5 MG tablet Take 1 tablet by mouth 2 (two) times daily.  . metoprolol tartrate (LOPRESSOR) 25 MG tablet Take 0.5 tablets (12.5 mg total) by mouth 2 (two) times daily.  . nitroGLYCERIN (NITROSTAT) 0.4 MG SL tablet Place 1 tablet (0.4 mg total) under the tongue every 5 (five) minutes as needed for chest pain.  Marland Kitchen. omeprazole (PRILOSEC) 20 MG capsule Take 20 mg by mouth daily.  . vitamin B-12 (CYANOCOBALAMIN) 1000 MCG tablet Take 1,000 mcg by mouth daily.     No Known Allergies  Social History   Socioeconomic History  . Marital status: Married    Spouse name: Not on file  . Number of children: Not on file  . Years of education: Not on file  . Highest education level: Not on file  Occupational History  . Not on file  Social Needs  . Financial resource strain: Not on file  . Food insecurity:    Worry: Not on file    Inability: Not on file  . Transportation needs:    Medical: Not on file    Non-medical: Not on file  Tobacco Use  . Smoking status: Former Smoker    Packs/day: 2.00    Years: 30.00    Pack years: 60.00    Types: Cigarettes    Last attempt to quit: 1990    Years since quitting: 29.2  . Smokeless tobacco: Never Used  Substance and Sexual Activity  .  Alcohol use: No    Alcohol/week: 0.0 oz  . Drug use: No  . Sexual activity: Never  Lifestyle  . Physical activity:    Days per week: Not on file    Minutes per session: Not on file  . Stress: Not on file  Relationships  . Social connections:    Talks on phone: Not on file    Gets together: Not on file    Attends religious service: Not on file    Active member of club or organization: Not on file    Attends meetings of clubs or organizations: Not on file    Relationship status: Not on file  . Intimate partner violence:    Fear of current or ex partner: Not on file    Emotionally abused: Not on file     Physically abused: Not on file    Forced sexual activity: Not on file  Other Topics Concern  . Not on file  Social History Narrative  . Not on file     Review of Systems: General: negative for chills, fever, night sweats or weight changes.  Cardiovascular: negative for chest pain, dyspnea on exertion, edema, orthopnea, palpitations, paroxysmal nocturnal dyspnea or shortness of breath Dermatological: negative for rash Respiratory: negative for cough or wheezing Urologic: negative for hematuria Abdominal: negative for nausea, vomiting, diarrhea, bright red blood per rectum, melena, or hematemesis Neurologic: negative for visual changes, syncope, or dizziness All other systems reviewed and are otherwise negative except as noted above.    Blood pressure 135/80, pulse 77, height 5\' 10"  (1.778 m), weight 222 lb 3.2 oz (100.8 kg).  General appearance: alert and no distress Neck: no adenopathy, no carotid bruit, no JVD, supple, symmetrical, trachea midline and thyroid not enlarged, symmetric, no tenderness/mass/nodules Lungs: clear to auscultation bilaterally Heart: regular rate and rhythm, S1, S2 normal, no murmur, click, rub or gallop Extremities: extremities normal, atraumatic, no cyanosis or edema Pulses: 2+ and symmetric Skin: Skin color, texture, turgor normal. No rashes or lesions Neurologic: Alert and oriented X 3, normal strength and tone. Normal symmetric reflexes. Normal coordination and gait  EKG sinus rhythm at 77 with small inferior Q waves. I personally reviewed this EKG.  ASSESSMENT AND PLAN:   STEMI involving oth coronary artery of inferior wall (HCC) History of inferior STEMI on Thanksgiving day 2017 (02/10/16 with an occluded dominant RCA which I stented using a 3.5 mm x 16 mL long synergy drug-eluting stent. His ejection fraction at that time was in the 45-50% range with inferobasal hypokinesia. He did have residual circumflex and LAD disease which I fixed the staged  fashion several days later. His EF ultimately improved slightly by 2-D echo. He remains on dual antiplatelet therapy with Esperanza Heir and is totally asymptomatic. I'm going to transition him to Plavix and obtain a verify now test 2 weeks later.  Ischemic cardiomyopathy History of ischemic cardiomyopathy with EF in the 45% range on appropriate medications and asymptomatic.  Hyperlipidemia History of hyperlipidemia on statin therapy followed by his PCP.      Runell Gess MD FACP,FACC,FAHA, [facility] 06/15/2017 10:18 AM

## 2017-06-15 NOTE — Assessment & Plan Note (Signed)
History of hyperlipidemia on statin therapy followed by his PCP 

## 2017-06-15 NOTE — Assessment & Plan Note (Signed)
History of inferior STEMI on Thanksgiving day 2017 (02/10/16 with an occluded dominant RCA which I stented using a 3.5 mm x 16 mL long synergy drug-eluting stent. His ejection fraction at that time was in the 45-50% range with inferobasal hypokinesia. He did have residual circumflex and LAD disease which I fixed the staged fashion several days later. His EF ultimately improved slightly by 2-D echo. He remains on dual antiplatelet therapy with Esperanza HeirBrilenta and is totally asymptomatic. I'm going to transition him to Plavix and obtain a verify now test 2 weeks later.

## 2017-06-15 NOTE — Assessment & Plan Note (Signed)
History of ischemic cardiomyopathy with EF in the 45% range on appropriate medications and asymptomatic.

## 2017-06-25 DIAGNOSIS — F0150 Vascular dementia without behavioral disturbance: Secondary | ICD-10-CM | POA: Diagnosis not present

## 2017-06-25 DIAGNOSIS — G309 Alzheimer's disease, unspecified: Secondary | ICD-10-CM | POA: Diagnosis not present

## 2017-06-25 DIAGNOSIS — F0280 Dementia in other diseases classified elsewhere without behavioral disturbance: Secondary | ICD-10-CM | POA: Diagnosis not present

## 2017-08-20 ENCOUNTER — Telehealth: Payer: Self-pay | Admitting: Cardiovascular Disease

## 2017-08-20 NOTE — Telephone Encounter (Signed)
Will ask Dr Allyson SabalBerry about stopping Plavix for dental extraction- scheduled for 08/23/17. Then contact the patient with recommendations.    Corine ShelterLUKE Eulene Pekar PA-C 08/20/2017 2:58 PM

## 2017-08-20 NOTE — Telephone Encounter (Signed)
New message   Call after 230pm 08/20/17  What dental office are you calling from?  [facility] What is your office phone number?  [phone]  1. What is your fax number [phone]   2. What type of procedure is the patient having performed?  Extraction for abcess   3. What date is procedure scheduled or is the patient there now? 08/23/17 ? Tentative   4. What is your question (ex. Antibiotics prior to procedure, holding medication-we need to know how long dentist wants pt to hold med)?  plavix - how long to told , patient has had stent placed in 2017 **Note patient is currently on Amoxicillin due to infection

## 2017-08-21 NOTE — Telephone Encounter (Signed)
   Primary Cardiologist: Nanetta BattyJonathan Berry, MD  Chart reviewed as part of pre-operative protocol coverage. Patient was contacted 08/21/2017 in reference to pre-operative risk assessment for pending surgery as outlined below.  Jeff Key was last seen 05/2017 by Dr. Allyson SabalBerry. He has history of CAD with inferior STEMI 2017 s/p PCI, ICM, HLD, mixed dementia, GERD. I clarified with dental office that procedure is anticipated to be a single extraction only to be done in office setting. Print production plannerffice manager is actually patient's daughter who confirms he is doing well from a cardiac standpoint without new concerns (patient has dementia and is not typically a reliable historian per notes). Per ACC/AHA guidelines, dental extractions are considered low risk procedures not requiring cardiac clearance therefore the patient would be at acceptable risk for the planned procedure without further cardiovascular testing. It is also generally accepted per literature that single dental extractions in patients receiving acetylsalicylic acid or clopidogrel can be safely performed without discontinuation of the therapy with provided appropriate local haemostasis. That being said, Dr. Allyson SabalBerry did state the patient could interrupt Plavix for dental procedure - this is if the dentist feels this is necessary.  I will route this recommendation to the requesting party via Epic fax function and remove from pre-op pool.  Please call with questions.  Laurann [place] N Dunn, PA-C 08/21/2017, 3:16 PM

## 2017-08-21 NOTE — Telephone Encounter (Signed)
OK to interrupt Plavix for dental procedure

## 2017-08-24 DIAGNOSIS — F0280 Dementia in other diseases classified elsewhere without behavioral disturbance: Secondary | ICD-10-CM | POA: Diagnosis not present

## 2017-08-24 DIAGNOSIS — I2510 Atherosclerotic heart disease of native coronary artery without angina pectoris: Secondary | ICD-10-CM | POA: Diagnosis not present

## 2017-08-24 DIAGNOSIS — R413 Other amnesia: Secondary | ICD-10-CM | POA: Diagnosis not present

## 2017-08-24 DIAGNOSIS — Z125 Encounter for screening for malignant neoplasm of prostate: Secondary | ICD-10-CM | POA: Diagnosis not present

## 2017-08-24 DIAGNOSIS — K51311 Ulcerative (chronic) rectosigmoiditis with rectal bleeding: Secondary | ICD-10-CM | POA: Diagnosis not present

## 2017-08-24 DIAGNOSIS — G309 Alzheimer's disease, unspecified: Secondary | ICD-10-CM | POA: Diagnosis not present

## 2017-08-24 DIAGNOSIS — F0150 Vascular dementia without behavioral disturbance: Secondary | ICD-10-CM | POA: Diagnosis not present

## 2017-08-24 DIAGNOSIS — Z0000 Encounter for general adult medical examination without abnormal findings: Secondary | ICD-10-CM | POA: Diagnosis not present

## 2017-08-31 DIAGNOSIS — R42 Dizziness and giddiness: Secondary | ICD-10-CM | POA: Diagnosis not present

## 2017-08-31 DIAGNOSIS — F0150 Vascular dementia without behavioral disturbance: Secondary | ICD-10-CM | POA: Diagnosis not present

## 2017-08-31 DIAGNOSIS — Z0000 Encounter for general adult medical examination without abnormal findings: Secondary | ICD-10-CM | POA: Diagnosis not present

## 2017-08-31 DIAGNOSIS — R413 Other amnesia: Secondary | ICD-10-CM | POA: Diagnosis not present

## 2017-08-31 DIAGNOSIS — G309 Alzheimer's disease, unspecified: Secondary | ICD-10-CM | POA: Diagnosis not present

## 2017-08-31 DIAGNOSIS — I2510 Atherosclerotic heart disease of native coronary artery without angina pectoris: Secondary | ICD-10-CM | POA: Diagnosis not present

## 2017-08-31 DIAGNOSIS — F0280 Dementia in other diseases classified elsewhere without behavioral disturbance: Secondary | ICD-10-CM | POA: Diagnosis not present

## 2017-12-08 ENCOUNTER — Other Ambulatory Visit: Payer: Self-pay | Admitting: Cardiovascular Disease

## 2017-12-25 DIAGNOSIS — F0280 Dementia in other diseases classified elsewhere without behavioral disturbance: Secondary | ICD-10-CM | POA: Diagnosis not present

## 2017-12-25 DIAGNOSIS — R42 Dizziness and giddiness: Secondary | ICD-10-CM | POA: Diagnosis not present

## 2017-12-25 DIAGNOSIS — R413 Other amnesia: Secondary | ICD-10-CM | POA: Diagnosis not present

## 2017-12-25 DIAGNOSIS — F0150 Vascular dementia without behavioral disturbance: Secondary | ICD-10-CM | POA: Diagnosis not present

## 2017-12-25 DIAGNOSIS — I2510 Atherosclerotic heart disease of native coronary artery without angina pectoris: Secondary | ICD-10-CM | POA: Diagnosis not present

## 2017-12-25 DIAGNOSIS — G309 Alzheimer's disease, unspecified: Secondary | ICD-10-CM | POA: Diagnosis not present

## 2018-01-01 DIAGNOSIS — J309 Allergic rhinitis, unspecified: Secondary | ICD-10-CM | POA: Diagnosis not present

## 2018-01-01 DIAGNOSIS — D649 Anemia, unspecified: Secondary | ICD-10-CM | POA: Diagnosis not present

## 2018-01-01 DIAGNOSIS — G309 Alzheimer's disease, unspecified: Secondary | ICD-10-CM | POA: Diagnosis not present

## 2018-01-01 DIAGNOSIS — F0150 Vascular dementia without behavioral disturbance: Secondary | ICD-10-CM | POA: Diagnosis not present

## 2018-01-01 DIAGNOSIS — R195 Other fecal abnormalities: Secondary | ICD-10-CM | POA: Diagnosis not present

## 2018-01-01 DIAGNOSIS — I2510 Atherosclerotic heart disease of native coronary artery without angina pectoris: Secondary | ICD-10-CM | POA: Diagnosis not present

## 2018-01-01 DIAGNOSIS — K51311 Ulcerative (chronic) rectosigmoiditis with rectal bleeding: Secondary | ICD-10-CM | POA: Diagnosis not present

## 2018-01-01 DIAGNOSIS — F0280 Dementia in other diseases classified elsewhere without behavioral disturbance: Secondary | ICD-10-CM | POA: Diagnosis not present

## 2018-01-01 DIAGNOSIS — Z23 Encounter for immunization: Secondary | ICD-10-CM | POA: Diagnosis not present

## 2018-01-10 DIAGNOSIS — R195 Other fecal abnormalities: Secondary | ICD-10-CM | POA: Diagnosis not present

## 2018-01-11 DIAGNOSIS — F0150 Vascular dementia without behavioral disturbance: Secondary | ICD-10-CM | POA: Diagnosis not present

## 2018-01-11 DIAGNOSIS — G309 Alzheimer's disease, unspecified: Secondary | ICD-10-CM | POA: Diagnosis not present

## 2018-01-11 DIAGNOSIS — F0280 Dementia in other diseases classified elsewhere without behavioral disturbance: Secondary | ICD-10-CM | POA: Diagnosis not present

## 2018-02-27 ENCOUNTER — Emergency Department: Payer: PPO

## 2018-02-27 ENCOUNTER — Emergency Department
Admission: EM | Admit: 2018-02-27 | Discharge: 2018-02-27 | Disposition: A | Discharge status: Home / Self Care | Payer: PPO | Attending: Emergency Medicine | Admitting: Emergency Medicine

## 2018-02-27 ENCOUNTER — Other Ambulatory Visit: Payer: Self-pay

## 2018-02-27 ENCOUNTER — Encounter: Payer: Self-pay | Admitting: Emergency Medicine

## 2018-02-27 DIAGNOSIS — I259 Chronic ischemic heart disease, unspecified: Secondary | ICD-10-CM | POA: Insufficient documentation

## 2018-02-27 DIAGNOSIS — R079 Chest pain, unspecified: Secondary | ICD-10-CM | POA: Diagnosis not present

## 2018-02-27 DIAGNOSIS — Z79899 Other long term (current) drug therapy: Secondary | ICD-10-CM | POA: Insufficient documentation

## 2018-02-27 DIAGNOSIS — K30 Functional dyspepsia: Secondary | ICD-10-CM | POA: Insufficient documentation

## 2018-02-27 DIAGNOSIS — F0280 Dementia in other diseases classified elsewhere without behavioral disturbance: Secondary | ICD-10-CM | POA: Diagnosis not present

## 2018-02-27 DIAGNOSIS — G309 Alzheimer's disease, unspecified: Secondary | ICD-10-CM | POA: Diagnosis not present

## 2018-02-27 DIAGNOSIS — Z7902 Long term (current) use of antithrombotics/antiplatelets: Secondary | ICD-10-CM | POA: Diagnosis not present

## 2018-02-27 DIAGNOSIS — Z87891 Personal history of nicotine dependence: Secondary | ICD-10-CM | POA: Insufficient documentation

## 2018-02-27 DIAGNOSIS — Z7982 Long term (current) use of aspirin: Secondary | ICD-10-CM | POA: Insufficient documentation

## 2018-02-27 LAB — BASIC METABOLIC PANEL
ANION GAP: 9 (ref 5–15)
BUN: 13 mg/dL (ref 8–23)
CO2: 24 mmol/L (ref 22–32)
Calcium: 8.9 mg/dL (ref 8.9–10.3)
Chloride: 102 mmol/L (ref 98–111)
Creatinine, Ser: 1.12 mg/dL (ref 0.61–1.24)
GFR calc non Af Amer: >60 mL/min (ref >60)
GLUCOSE: 103 mg/dL — AB (ref 70–99)
Potassium: 4.2 mmol/L (ref 3.5–5.1)
SODIUM: 135 mmol/L (ref 135–145)

## 2018-02-27 LAB — CBC
HCT: 41 % (ref 39.0–52.0)
HEMOGLOBIN: 13.2 g/dL (ref 13.0–17.0)
MCH: 29.1 pg (ref 26.0–34.0)
MCHC: 32.2 g/dL (ref 30.0–36.0)
MCV: 90.3 fL (ref 80.0–100.0)
NRBC: 0 % (ref 0.0–0.2)
PLATELETS: 287 10*3/uL (ref 150–400)
RBC: 4.54 MIL/uL (ref 4.22–5.81)
RDW: 12.9 % (ref 11.5–15.5)
WBC: 9.3 10*3/uL (ref 4.0–10.5)

## 2018-02-27 LAB — TROPONIN I: Troponin I: <0.03 ng/mL (ref <0.03)

## 2018-02-27 NOTE — ED Notes (Signed)
Pt signed topaz then it froze up.

## 2018-02-27 NOTE — Discharge Instructions (Addendum)

## 2018-02-27 NOTE — ED Notes (Signed)
Pt dangling on edge of bed talking with family.

## 2018-02-27 NOTE — ED Triage Notes (Signed)
Pt presents to ED via POV with his wife. Per wife patient had MI several years ago about this time. Pt c/o substernal chest pain that was relieved with a burp. Pt denies pain at this time, pt states "I feel at rest".

## 2018-02-27 NOTE — ED Provider Notes (Signed)
[facility] Emergency Department Provider Note  ____________________________________________  Time seen: Approximately 7:19 PM  I have reviewed the triage vital signs and the nursing notes.   HISTORY  Chief Complaint Chest Pain   HPI Jeff Key is a 72 y.o. male with a history of CAD status post STEMI in 2017 on Plavix, anemia, GERD, peptic ulcer disease, ischemic cardiomyopathy, Alzheimer's who presents for evaluation of CP. Pain started after patient ate a hamburger.  He was complaining of pain located in his epigastric region that he described as pressure.  Patient has advanced Alzheimer's and is very confused which according to the family is baseline.  The family reports the patient kept complaining for over an hour of pain which prompted a visit to the emergency room.  He had no shortness of breath, no nausea or vomiting.  In route to the emergency room patient let out a loud burp and the pain then completely resolved.  Patient denies any pain at this time.  Past Medical History:  Diagnosis Date  . Anemia ~ 11/2015  . CAD (coronary artery disease)    a. inf STEMI on 02/10/16. RCA treated with DES followed by staged PCI of LAD and Circumflex 02/14/16.  Marland Kitchen. Dementia (HCC) dx'd 01/2016  . GERD (gastroesophageal reflux disease)   . History of stomach ulcers 1980s?  . Ischemic cardiomyopathy    a. 01/2016: EF 40-45%.     Patient Active Problem List   Diagnosis Date Noted  . Hyperlipidemia 06/15/2017  . Alzheimer's dementia without behavioral disturbance (HCC) 12/11/2016  . Sepsis (HCC) 03/10/2016  . 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 NSVT (nonsustained ventricular tachycardia) (HCC) 02/15/2016  . CAD (coronary artery disease)   . Ischemic cardiomyopathy   . STEMI involving oth coronary artery of inferior wall (HCC) 02/10/2016  . Abnormal urinalysis 10/16/2014    Past Surgical History:  Procedure Laterality Date  .  CARDIAC CATHETERIZATION N/A 02/10/2016   Procedure: Left Heart Cath and Coronary Angiography;  Surgeon: Runell GessJonathan J Berry, MD;  [facility];  Service: Cardiovascular;  Laterality: N/A;  . CARDIAC CATHETERIZATION N/A 02/10/2016   Procedure: Coronary Stent Intervention;  Surgeon: Runell GessJonathan J Berry, MD;  Location: [facility] INVASIVE CV LAB;  Service: Cardiovascular;  Laterality: N/A;  . CARDIAC CATHETERIZATION N/A 02/14/2016   Procedure: Coronary Stent Intervention-LAD and CFX;  Surgeon: Runell GessJonathan J Berry, MD;  Location: [facility] INVASIVE CV LAB;  Service: Cardiovascular;  Laterality: N/A;  . COLONOSCOPY WITH PROPOFOL N/A 05/06/2015   Procedure: COLONOSCOPY WITH PROPOFOL;  Surgeon: Wallace CullensPaul Y Oh, MD;  [facility];  Service: Gastroenterology;  Laterality: N/A;  . CORONARY ANGIOPLASTY WITH STENT PLACEMENT  02/14/2016   "2 stents"  . ESOPHAGOGASTRODUODENOSCOPY (EGD) WITH PROPOFOL N/A 05/06/2015   Procedure: ESOPHAGOGASTRODUODENOSCOPY (EGD) WITH PROPOFOL;  Surgeon: Wallace CullensPaul Y Oh, MD;  [facility];  Service: Gastroenterology;  Laterality: N/A;  . FOREARM FRACTURE SURGERY Right 1985   "got it tore up in [place]"  . FRACTURE SURGERY      Prior to Admission medications   Medication Sig Start Date End Date Taking? Authorizing Provider  aspirin EC 81 MG tablet Take 81 mg by mouth daily.    [provider]  atorvastatin (LIPITOR) 10 MG tablet Take 10 mg by mouth daily. 11/10/16   [provider]  BRILINTA 90 MG TABS tablet TAKE ONE TABLET BY MOUTH TWICE DAILY 03/14/17   Runell GessBerry, Jonathan J, MD  clopidogrel (PLAVIX) 75 MG tablet  Take 1 tablet (75 mg total) by mouth daily. 06/15/17   Runell Gess, MD  ferrous sulfate 325 (65 FE) MG tablet Take 325 mg by mouth daily with breakfast.    [provider]  galantamine (RAZADYNE) 4 MG tablet Take 4 mg by mouth 2 (two) times daily with a meal.  01/31/16 01/30/17  [provider]  lisinopril (PRINIVIL,ZESTRIL)  10 MG tablet TAKE 1 TABLET BY MOUTH ONCE DAILY 06/11/17   Runell Gess, MD  memantine (NAMENDA) 5 MG tablet Take 1 tablet by mouth 2 (two) times daily. 05/07/17 05/07/18  [provider]  metoprolol tartrate (LOPRESSOR) 25 MG tablet TAKE 1/2 (ONE-HALF) TABLET BY MOUTH TWICE DAILY 12/10/17   Runell Gess, MD  nitroGLYCERIN (NITROSTAT) 0.4 MG SL tablet Place 1 tablet (0.4 mg total) under the tongue every 5 (five) minutes as needed for chest pain. 02/15/16   Janetta Hora, PA-C  omeprazole (PRILOSEC) 20 MG capsule Take 20 mg by mouth daily.    [provider]  vitamin B-12 (CYANOCOBALAMIN) 1000 MCG tablet Take 1,000 mcg by mouth daily.    [provider]    Allergies Patient has no known allergies.  Family History  Problem Relation Age of Onset  . Prostate cancer Father   . Bladder Cancer Neg Hx   . Kidney cancer Neg Hx     Social History Social History   Tobacco Use  . Smoking status: Former Smoker    Packs/day: 2.00    Years: 30.00    Pack years: 60.00    Types: Cigarettes    Last attempt to quit: 1990    Years since quitting: 29.9  . Smokeless tobacco: Never Used  Substance Use Topics  . Alcohol use: No    Alcohol/week: 0.0 standard drinks  . Drug use: No    Review of Systems  Constitutional: Negative for fever. Eyes: Negative for visual changes. ENT: Negative for sore throat. Neck: No neck pain  Cardiovascular: + chest pain. Respiratory: Negative for shortness of breath. Gastrointestinal: Negative for abdominal pain, vomiting or diarrhea. Genitourinary: Negative for dysuria. Musculoskeletal: Negative for back pain. Skin: Negative for rash. Neurological: Negative for headaches, weakness or numbness. Psych: No SI or HI  ____________________________________________   PHYSICAL EXAM:  VITAL SIGNS: ED Triage Vitals  Enc Vitals Group     BP 02/27/18 1625 117/75     Pulse Rate 02/27/18 1625 76     Resp 02/27/18 1747 15      Temp 02/27/18 1625 98 F (36.7 C)     Temp Source 02/27/18 1625 Oral     SpO2 02/27/18 1625 94 %     Weight 02/27/18 1618 220 lb (99.8 kg)     Height 02/27/18 1618 5\' 11"  (1.803 m)     Head Circumference --      Peak Flow --      Pain Score 02/27/18 1618 0     Pain Loc --      Pain Edu? --      Excl. in GC? --     Constitutional: Alert and oriented x 1. Well appearing and in no apparent distress. HEENT:      Head: Normocephalic and atraumatic.         Eyes: Conjunctivae are normal. Sclera is non-icteric.       Mouth/Throat: Mucous membranes are moist.       Neck: Supple with no signs of meningismus. Cardiovascular: Regular rate and rhythm. No murmurs, gallops, or  rubs. 2+ symmetrical distal pulses are present in all extremities. No JVD. Respiratory: Normal respiratory effort. Lungs are clear to auscultation bilaterally. No wheezes, crackles, or rhonchi.  Gastrointestinal: Soft, non tender, and non distended with positive bowel sounds. No rebound or guarding. Musculoskeletal: Nontender with normal range of motion in all extremities. No edema, cyanosis, or erythema of extremities. Neurologic: Normal speech and language. Face is symmetric. Moving all extremities. No gross focal neurologic deficits are appreciated. Skin: Skin is warm, dry and intact. No rash noted. Psychiatric: Mood and affect are normal. Speech and behavior are normal.  ____________________________________________   LABS (all labs ordered are listed, but only abnormal results are displayed)  Labs Reviewed  BASIC METABOLIC PANEL - Abnormal; Notable for the following components:      Result Value   Glucose, Bld 103 (*)    All other components within normal limits  CBC  TROPONIN I  TROPONIN I   ____________________________________________  EKG  ED ECG REPORT I, Nita Sickle, the attending physician, personally viewed and interpreted this ECG.  Normal sinus rhythm, rate of 76, normal intervals, normal  axis, no ST elevations or depressions, Q waves in inferior leads.  Unchanged from prior. ____________________________________________  RADIOLOGY  I have personally reviewed the images performed during this visit and I agree with the Radiologist's read.   Interpretation by Radiologist:  Dg Chest 2 View  Result Date: 02/27/2018 CLINICAL DATA:  Chest pain EXAM: CHEST - 2 VIEW COMPARISON:  03/10/2016 FINDINGS: Bibasilar atelectasis. Heart is normal size. No effusions. No acute bony abnormality. IMPRESSION: Bibasilar atelectasis. Electronically Signed   By: Charlett Nose M.D.   On: 02/27/2018 16:51     ____________________________________________   PROCEDURES  Procedure(s) performed: None Procedures Critical Care performed:  None ____________________________________________   INITIAL IMPRESSION / ASSESSMENT AND PLAN / ED COURSE   72 y.o. male with a history of CAD status post STEMI in 2017 on Plavix, anemia, GERD, peptic ulcer disease, ischemic cardiomyopathy, Alzheimer's who presents for evaluation of CP which resolved before arrival to the ED after patient burped.  Patient is extremely well-appearing and in no distress, has normal vital signs, EKG shows no acute ischemic changes.  Troponin x2 is negative.  He remains asymptomatic in the emergency room.  Presentation most likely concerning for GERD/GI etiology.  At this time will discharge home with close follow-up with primary care doctor.  Discussed standard return precautions with patient's family.      As part of my medical decision making, I reviewed the following data within the electronic MEDICAL RECORD NUMBER Nursing notes reviewed and incorporated, Labs reviewed , EKG interpreted , Old EKG reviewed, Old chart reviewed, Radiograph reviewed , Notes from prior ED visits and [place] Controlled Substance Database    Pertinent labs & imaging results that were available during my care of the patient were reviewed by me and considered in my  medical decision making (see chart for details).    ____________________________________________   FINAL CLINICAL IMPRESSION(S) / ED DIAGNOSES  Final diagnoses:  Chest pain, unspecified type  Indigestion      NEW MEDICATIONS STARTED DURING THIS VISIT:  ED Discharge Orders    None       Note:  This document was prepared using Dragon voice recognition software and may include unintentional dictation errors.    Nita Sickle, MD 02/27/18 [phone]

## 2018-03-05 DIAGNOSIS — F0150 Vascular dementia without behavioral disturbance: Secondary | ICD-10-CM | POA: Diagnosis not present

## 2018-03-05 DIAGNOSIS — I2510 Atherosclerotic heart disease of native coronary artery without angina pectoris: Secondary | ICD-10-CM | POA: Diagnosis not present

## 2018-03-05 DIAGNOSIS — G309 Alzheimer's disease, unspecified: Secondary | ICD-10-CM | POA: Diagnosis not present

## 2018-03-05 DIAGNOSIS — F0280 Dementia in other diseases classified elsewhere without behavioral disturbance: Secondary | ICD-10-CM | POA: Diagnosis not present

## 2018-03-05 DIAGNOSIS — K219 Gastro-esophageal reflux disease without esophagitis: Secondary | ICD-10-CM | POA: Diagnosis not present

## 2018-03-14 ENCOUNTER — Other Ambulatory Visit: Payer: Self-pay | Admitting: Cardiovascular Disease

## 2018-03-22 ENCOUNTER — Ambulatory Visit (INDEPENDENT_AMBULATORY_CARE_PROVIDER_SITE_OTHER): Payer: PPO | Admitting: Cardiovascular Disease

## 2018-03-22 ENCOUNTER — Encounter: Payer: Self-pay | Admitting: Cardiovascular Disease

## 2018-03-22 DIAGNOSIS — I255 Ischemic cardiomyopathy: Secondary | ICD-10-CM

## 2018-03-22 DIAGNOSIS — E782 Mixed hyperlipidemia: Secondary | ICD-10-CM | POA: Diagnosis not present

## 2018-03-22 DIAGNOSIS — I2119 ST elevation (STEMI) myocardial infarction involving other coronary artery of inferior wall: Secondary | ICD-10-CM | POA: Diagnosis not present

## 2018-03-22 NOTE — Assessment & Plan Note (Signed)
History of hyperlipidemia on statin therapy followed by his PCP 

## 2018-03-22 NOTE — Assessment & Plan Note (Signed)
History of CAD status post inferior STEMI on Thanksgiving 2017.  He had a synergy placed in his RCA.  Several days later he underwent staged LAD and circumflex intervention via the femoral approach which was complicated by a small pseudoaneurysm which ultimately closed spontaneously.  He remains on aspirin and Plavix.  He had 1 recent episode of reflux type chest pain for which she was seen in ER on 02/27/2018 and ruled out.  He said no recurrent episodes.

## 2018-03-22 NOTE — Patient Instructions (Signed)
Medication Instructions:  No change If you need a refill on your cardiac medications before your next appointment, please call your pharmacy.   Lab work: None If you have labs (blood work) drawn today and your tests are completely normal, you will receive your results only by: Marland Kitchen MyChart Message (if you have MyChart) OR . A paper copy in the mail If you have any lab test that is abnormal or we need to change your treatment, we will call you to review the results.  Testing/Procedures: None  Follow-Up: At [facility], you and your health needs are our priority.  As part of our continuing mission to provide you with exceptional heart care, we have created designated Provider Care Teams.  These Care Teams include your primary Cardiologist (physician) and Advanced Practice Providers (APPs -  Physician Assistants and Nurse Practitioners) who all work together to provide you with the care you need, when you need it. You will need a follow up appointment in 12 months.  Please call our office 2 months in advance to schedule this appointment.  You may see Nanetta Batty, MD or one of the following Advanced Practice Providers on your designated Care Team:   Corine Shelter, PA-C Judy Pimple, [place] . Marjie Skiff, PA-C  Any Other Special Instructions Will Be Listed Below (If Applicable).

## 2018-03-22 NOTE — Assessment & Plan Note (Signed)
History of ischemic cardiomyopathy with an EF in the 45 to 50% range by 2D echo 05/18/2016 without symptoms.

## 2018-03-22 NOTE — Progress Notes (Signed)
03/22/2018 Jeff Key   1945/07/26  [id number]  Primary Physician [name], MD Primary Cardiologist: Runell Gess MD Milagros Loll, [name], [place]  HPI:  Jeff Key is a 73 y.o.  Caucasian male father of 11, grandfather and 6 grandchildren who lives in [place] I last saw him in the office  06/15/2017.He has newly diagnosed Alzheimer's disease and was recently admitted to [facility] in September for pneumonia. He was brought into the ER on Thanksgiving day with nonspecific symptoms however he was complaining of chest pain apparently and an EKG showed inferior ST segment elevation. He was transferred by EMS to come in for angiography and urgent intervention. He had a totally occluded proximal dominant RCA stenosis which I stented using a synergy drug-eluting stent. He had residual mid LAD and circumflex disease. His EF was mildly depressed. Treated with aspirin and Brilenta has remained stable. His troponin peaked at 40. He underwent staged LAD and circumflex PCI and drug-eluting stenting. Right femoral approach on 02/15/16 and this was uncomplicated. He was discharged on the following day. Apparently he was seen in early follow-up in the office and a bruit was noted.He had a small pseudoaneurysm by duplex ultrasound performed 02/22/16. Follow-up ultrasound recently performed showed that this had spontaneously occluded. His EF likewise has somewhat improved from the 40-45% range back in November up to 45-50% recently by 2-D echocardiogram.  Since I saw him back in March of last year he did have an episode of atypical chest pain which sounded more like reflux.  He went to the ER on 02/27/2018 and ruled out for myocardial infarction.  Said no recurrent episodes.  His Alzheimer's disease appears to have progressed as well.  He remains on dual antiplatelet therapy including low-dose aspirin and Plavix.    Current Meds  Medication Sig  . aspirin EC 81 MG tablet Take 81 mg by mouth  daily.  Marland Kitchen atorvastatin (LIPITOR) 10 MG tablet Take 10 mg by mouth daily.  Marland Kitchen BRILINTA 90 MG TABS tablet TAKE ONE TABLET BY MOUTH TWICE DAILY  . clopidogrel (PLAVIX) 75 MG tablet Take 1 tablet (75 mg total) by mouth daily.  . ferrous sulfate 325 (65 FE) MG tablet Take 325 mg by mouth daily with breakfast.  . lisinopril (PRINIVIL,ZESTRIL) 10 MG tablet TAKE 1 TABLET BY MOUTH ONCE DAILY  . memantine (NAMENDA) 5 MG tablet Take 1 tablet by mouth 2 (two) times daily.  . metoprolol tartrate (LOPRESSOR) 25 MG tablet TAKE 1/2 (ONE-HALF) TABLET BY MOUTH TWICE DAILY  . nitroGLYCERIN (NITROSTAT) 0.4 MG SL tablet Place 1 tablet (0.4 mg total) under the tongue every 5 (five) minutes as needed for chest pain.  Marland Kitchen omeprazole (PRILOSEC) 20 MG capsule Take 20 mg by mouth daily.  . vitamin B-12 (CYANOCOBALAMIN) 1000 MCG tablet Take 1,000 mcg by mouth daily.     No Known Allergies  Social History   Socioeconomic History  . Marital status: Married    Spouse name: Not on file  . Number of children: Not on file  . Years of education: Not on file  . Highest education level: Not on file  Occupational History  . Not on file  Social Needs  . Financial resource strain: Not on file  . Food insecurity:    Worry: Not on file    Inability: Not on file  . Transportation needs:    Medical: Not on file    Non-medical: Not on file  Tobacco Use  . Smoking  status: Former Smoker    Packs/day: 2.00    Years: 30.00    Pack years: 60.00    Types: Cigarettes    Last attempt to quit: 1990    Years since quitting: 30.0  . Smokeless tobacco: Never Used  Substance and Sexual Activity  . Alcohol use: No    Alcohol/week: 0.0 standard drinks  . Drug use: No  . Sexual activity: Never  Lifestyle  . Physical activity:    Days per week: Not on file    Minutes per session: Not on file  . Stress: Not on file  Relationships  . Social connections:    Talks on phone: Not on file    Gets together: Not on file    Attends  religious service: Not on file    Active member of club or organization: Not on file    Attends meetings of clubs or organizations: Not on file    Relationship status: Not on file  . Intimate partner violence:    Fear of current or ex partner: Not on file    Emotionally abused: Not on file    Physically abused: Not on file    Forced sexual activity: Not on file  Other Topics Concern  . Not on file  Social History Narrative  . Not on file     Review of Systems: General: negative for chills, fever, night sweats or weight changes.  Cardiovascular: negative for chest pain, dyspnea on exertion, edema, orthopnea, palpitations, paroxysmal nocturnal dyspnea or shortness of breath Dermatological: negative for rash Respiratory: negative for cough or wheezing Urologic: negative for hematuria Abdominal: negative for nausea, vomiting, diarrhea, bright red blood per rectum, melena, or hematemesis Neurologic: negative for visual changes, syncope, or dizziness All other systems reviewed and are otherwise negative except as noted above.    Blood pressure 112/66, pulse 62, height 5\' 10"  (1.778 m), weight 226 lb 3.2 oz (102.6 kg).  General appearance: alert and no distress Neck: no adenopathy, no carotid bruit, no JVD, supple, symmetrical, trachea midline and thyroid not enlarged, symmetric, no tenderness/mass/nodules Lungs: clear to auscultation bilaterally Heart: regular rate and rhythm, S1, S2 normal, no murmur, click, rub or gallop Extremities: extremities normal, atraumatic, no cyanosis or edema Pulses: 2+ and symmetric Skin: Skin color, texture, turgor normal. No rashes or lesions Neurologic: Alert and oriented X 3, normal strength and tone. Normal symmetric reflexes. Normal coordination and gait  EKG not performed today.  ASSESSMENT AND PLAN:   STEMI involving oth coronary artery of inferior wall (HCC) History of CAD status post inferior STEMI on Thanksgiving 2017.  He had a synergy  placed in his RCA.  Several days later he underwent staged LAD and circumflex intervention via the femoral approach which was complicated by a small pseudoaneurysm which ultimately closed spontaneously.  He remains on aspirin and Plavix.  He had 1 recent episode of reflux type chest pain for which she was seen in ER on 02/27/2018 and ruled out.  He said no recurrent episodes.  Ischemic cardiomyopathy History of ischemic cardiomyopathy with an EF in the 45 to 50% range by 2D echo 05/18/2016 without symptoms.  Hyperlipidemia History of hyperlipidemia on statin therapy followed by his PCP.      Runell Gess MD FACP,FACC,FAHA, [facility] 03/22/2018 11:10 AM

## 2018-04-08 DIAGNOSIS — K219 Gastro-esophageal reflux disease without esophagitis: Secondary | ICD-10-CM | POA: Diagnosis not present

## 2018-04-08 DIAGNOSIS — K51919 Ulcerative colitis, unspecified with unspecified complications: Secondary | ICD-10-CM | POA: Diagnosis not present

## 2018-06-08 ENCOUNTER — Other Ambulatory Visit: Payer: Self-pay | Admitting: Cardiovascular Disease

## 2018-06-26 DIAGNOSIS — J3089 Other allergic rhinitis: Secondary | ICD-10-CM | POA: Diagnosis not present

## 2018-06-26 DIAGNOSIS — G309 Alzheimer's disease, unspecified: Secondary | ICD-10-CM | POA: Diagnosis not present

## 2018-06-26 DIAGNOSIS — F0150 Vascular dementia without behavioral disturbance: Secondary | ICD-10-CM | POA: Diagnosis not present

## 2018-06-26 DIAGNOSIS — Z125 Encounter for screening for malignant neoplasm of prostate: Secondary | ICD-10-CM | POA: Diagnosis not present

## 2018-06-26 DIAGNOSIS — D649 Anemia, unspecified: Secondary | ICD-10-CM | POA: Diagnosis not present

## 2018-06-26 DIAGNOSIS — K51311 Ulcerative (chronic) rectosigmoiditis with rectal bleeding: Secondary | ICD-10-CM | POA: Diagnosis not present

## 2018-06-26 DIAGNOSIS — T7840XA Allergy, unspecified, initial encounter: Secondary | ICD-10-CM | POA: Diagnosis not present

## 2018-06-26 DIAGNOSIS — R195 Other fecal abnormalities: Secondary | ICD-10-CM | POA: Diagnosis not present

## 2018-06-26 DIAGNOSIS — I2510 Atherosclerotic heart disease of native coronary artery without angina pectoris: Secondary | ICD-10-CM | POA: Diagnosis not present

## 2018-06-26 DIAGNOSIS — F0280 Dementia in other diseases classified elsewhere without behavioral disturbance: Secondary | ICD-10-CM | POA: Diagnosis not present

## 2018-07-03 DIAGNOSIS — F0150 Vascular dementia without behavioral disturbance: Secondary | ICD-10-CM | POA: Diagnosis not present

## 2018-07-03 DIAGNOSIS — Z23 Encounter for immunization: Secondary | ICD-10-CM | POA: Diagnosis not present

## 2018-07-03 DIAGNOSIS — Z0000 Encounter for general adult medical examination without abnormal findings: Secondary | ICD-10-CM | POA: Diagnosis not present

## 2018-07-03 DIAGNOSIS — K51311 Ulcerative (chronic) rectosigmoiditis with rectal bleeding: Secondary | ICD-10-CM | POA: Diagnosis not present

## 2018-07-03 DIAGNOSIS — G309 Alzheimer's disease, unspecified: Secondary | ICD-10-CM | POA: Diagnosis not present

## 2018-07-03 DIAGNOSIS — F0280 Dementia in other diseases classified elsewhere without behavioral disturbance: Secondary | ICD-10-CM | POA: Diagnosis not present

## 2018-07-03 DIAGNOSIS — I2510 Atherosclerotic heart disease of native coronary artery without angina pectoris: Secondary | ICD-10-CM | POA: Diagnosis not present

## 2018-07-03 DIAGNOSIS — J3089 Other allergic rhinitis: Secondary | ICD-10-CM | POA: Diagnosis not present

## 2018-09-09 ENCOUNTER — Other Ambulatory Visit: Payer: Self-pay | Admitting: Cardiovascular Disease

## 2018-09-25 DIAGNOSIS — F0150 Vascular dementia without behavioral disturbance: Secondary | ICD-10-CM | POA: Diagnosis not present

## 2018-09-25 DIAGNOSIS — G309 Alzheimer's disease, unspecified: Secondary | ICD-10-CM | POA: Diagnosis not present

## 2018-09-25 DIAGNOSIS — F0280 Dementia in other diseases classified elsewhere without behavioral disturbance: Secondary | ICD-10-CM | POA: Diagnosis not present

## 2018-09-26 DIAGNOSIS — K625 Hemorrhage of anus and rectum: Secondary | ICD-10-CM | POA: Diagnosis not present

## 2018-09-26 DIAGNOSIS — K51919 Ulcerative colitis, unspecified with unspecified complications: Secondary | ICD-10-CM | POA: Diagnosis not present

## 2018-12-26 DIAGNOSIS — G309 Alzheimer's disease, unspecified: Secondary | ICD-10-CM | POA: Diagnosis not present

## 2018-12-26 DIAGNOSIS — Z0000 Encounter for general adult medical examination without abnormal findings: Secondary | ICD-10-CM | POA: Diagnosis not present

## 2018-12-26 DIAGNOSIS — F0280 Dementia in other diseases classified elsewhere without behavioral disturbance: Secondary | ICD-10-CM | POA: Diagnosis not present

## 2018-12-26 DIAGNOSIS — T7840XA Allergy, unspecified, initial encounter: Secondary | ICD-10-CM | POA: Diagnosis not present

## 2018-12-26 DIAGNOSIS — F0150 Vascular dementia without behavioral disturbance: Secondary | ICD-10-CM | POA: Diagnosis not present

## 2018-12-26 DIAGNOSIS — I2510 Atherosclerotic heart disease of native coronary artery without angina pectoris: Secondary | ICD-10-CM | POA: Diagnosis not present

## 2018-12-26 DIAGNOSIS — K51311 Ulcerative (chronic) rectosigmoiditis with rectal bleeding: Secondary | ICD-10-CM | POA: Diagnosis not present

## 2019-01-02 DIAGNOSIS — G309 Alzheimer's disease, unspecified: Secondary | ICD-10-CM | POA: Diagnosis not present

## 2019-01-02 DIAGNOSIS — Z23 Encounter for immunization: Secondary | ICD-10-CM | POA: Diagnosis not present

## 2019-01-02 DIAGNOSIS — R413 Other amnesia: Secondary | ICD-10-CM | POA: Diagnosis not present

## 2019-01-02 DIAGNOSIS — F0280 Dementia in other diseases classified elsewhere without behavioral disturbance: Secondary | ICD-10-CM | POA: Diagnosis not present

## 2019-01-02 DIAGNOSIS — I2510 Atherosclerotic heart disease of native coronary artery without angina pectoris: Secondary | ICD-10-CM | POA: Diagnosis not present

## 2019-01-02 DIAGNOSIS — F0150 Vascular dementia without behavioral disturbance: Secondary | ICD-10-CM | POA: Diagnosis not present

## 2019-01-02 DIAGNOSIS — R739 Hyperglycemia, unspecified: Secondary | ICD-10-CM | POA: Diagnosis not present

## 2019-02-12 DIAGNOSIS — R197 Diarrhea, unspecified: Secondary | ICD-10-CM | POA: Diagnosis not present

## 2019-02-12 DIAGNOSIS — Z20828 Contact with and (suspected) exposure to other viral communicable diseases: Secondary | ICD-10-CM | POA: Diagnosis not present

## 2019-02-12 DIAGNOSIS — R519 Headache, unspecified: Secondary | ICD-10-CM | POA: Diagnosis not present

## 2019-02-21 ENCOUNTER — Other Ambulatory Visit: Payer: Self-pay

## 2019-02-21 ENCOUNTER — Emergency Department
Admission: EM | Admit: 2019-02-21 | Discharge: 2019-02-21 | Disposition: A | Discharge status: Home / Self Care | Payer: PPO | Attending: Emergency Medicine | Admitting: Emergency Medicine

## 2019-02-21 DIAGNOSIS — Z79899 Other long term (current) drug therapy: Secondary | ICD-10-CM | POA: Insufficient documentation

## 2019-02-21 DIAGNOSIS — F0390 Unspecified dementia without behavioral disturbance: Secondary | ICD-10-CM | POA: Diagnosis not present

## 2019-02-21 DIAGNOSIS — Z87891 Personal history of nicotine dependence: Secondary | ICD-10-CM | POA: Insufficient documentation

## 2019-02-21 DIAGNOSIS — I2510 Atherosclerotic heart disease of native coronary artery without angina pectoris: Secondary | ICD-10-CM | POA: Insufficient documentation

## 2019-02-21 DIAGNOSIS — I252 Old myocardial infarction: Secondary | ICD-10-CM | POA: Diagnosis not present

## 2019-02-21 DIAGNOSIS — E860 Dehydration: Secondary | ICD-10-CM | POA: Insufficient documentation

## 2019-02-21 DIAGNOSIS — A09 Infectious gastroenteritis and colitis, unspecified: Secondary | ICD-10-CM | POA: Insufficient documentation

## 2019-02-21 DIAGNOSIS — U071 COVID-19: Secondary | ICD-10-CM | POA: Diagnosis not present

## 2019-02-21 DIAGNOSIS — R197 Diarrhea, unspecified: Secondary | ICD-10-CM | POA: Diagnosis present

## 2019-02-21 DIAGNOSIS — Z7982 Long term (current) use of aspirin: Secondary | ICD-10-CM | POA: Diagnosis not present

## 2019-02-21 DIAGNOSIS — R531 Weakness: Secondary | ICD-10-CM | POA: Diagnosis not present

## 2019-02-21 LAB — CBC
HCT: 39.7 % (ref 39.0–52.0)
Hemoglobin: 13 g/dL (ref 13.0–17.0)
MCH: 28.1 pg (ref 26.0–34.0)
MCHC: 32.7 g/dL (ref 30.0–36.0)
MCV: 85.9 fL (ref 80.0–100.0)
Platelets: 202 10*3/uL (ref 150–400)
RBC: 4.62 MIL/uL (ref 4.22–5.81)
RDW: 12.9 % (ref 11.5–15.5)
WBC: 7.6 10*3/uL (ref 4.0–10.5)
nRBC: 0 % (ref 0.0–0.2)

## 2019-02-21 LAB — BASIC METABOLIC PANEL
Anion gap: 12 (ref 5–15)
BUN: 11 mg/dL (ref 8–23)
CO2: 22 mmol/L (ref 22–32)
Calcium: 8.4 mg/dL — ABNORMAL LOW (ref 8.9–10.3)
Chloride: 100 mmol/L (ref 98–111)
Creatinine, Ser: 1.04 mg/dL (ref 0.61–1.24)
GFR calc Af Amer: >60 mL/min (ref >60)
GFR calc non Af Amer: >60 mL/min (ref >60)
Glucose, Bld: 119 mg/dL — ABNORMAL HIGH (ref 70–99)
Potassium: 3.6 mmol/L (ref 3.5–5.1)
Sodium: 134 mmol/L — ABNORMAL LOW (ref 135–145)

## 2019-02-21 MED ORDER — SODIUM CHLORIDE 0.9 % IV BOLUS: 500.0000 mL | Freq: Once | INTRAVENOUS | Status: DC

## 2019-02-21 MED ORDER — SODIUM CHLORIDE 0.9% FLUSH: 3.0000 mL | Freq: Once | INTRAVENOUS | Status: DC

## 2019-02-21 NOTE — ED Triage Notes (Signed)
Pt is here with his daughter, pt has alzheimers, states he was tested Covid + on 11/25, but had diarrhea since the week before and continues to have the diarrhea and is more confused then normal and they are concerned for dehydration.

## 2019-02-21 NOTE — ED Notes (Signed)
Pt refused d/c VS. PT in NAD, pt to wheelchair. PT discharged with daughter . Unable to sign due to computer malfnx

## 2019-02-21 NOTE — ED Notes (Signed)
PT does not follow commands at baseline due to dementia. Pt refuses to leave on monitor. This RN attmpted x2 for IV access with no success. MD Jari Pigg aware

## 2019-02-21 NOTE — ED Provider Notes (Signed)
[facility] Emergency Department Provider Note  ____________________________________________   First MD Initiated Contact with Patient 02/21/19 1224     (approximate)  I have reviewed the triage vital signs and the nursing notes.   HISTORY  Chief Complaint Weakness and Diarrhea    HPI Jeff Key is a 73 y.o. male with Alzheimer's who was positive for coronavirus on 11/25 but has continued to have diarrhea so is coming in to be evaluated for dehydration.  Patient has severe dementia at baseline.  Patient lives at home with his wife who helps take care of him.  Patient never really had any symptoms of coronavirus but his wife was positive and so got tested due to having some diarrhea.  Patient was also positive.  Patient's diarrhea had improved but then started again today and so they were concerned that he might be dehydrated so they brought him into the ER for evaluation.  His diarrhea has been intermittent, onset a week ago, multiple episodes today, nonbloody, better on its own.  Denies prior history of C. difficile or any other recent antibiotics.  Does not seem to be in any pain although difficult to assess given patient is not able to really communicate anything due to his Alzheimer's.  They also noted that he seems a little bit confused more than baseline.  Denies any recent falls where he is hit his head.  Has been progressively getting worse with his dementia prior to the coronavirus.          Past Medical History:  Diagnosis Date   Anemia ~ 11/2015   CAD (coronary artery disease)    a. inf STEMI on 02/10/16. RCA treated with DES followed by staged PCI of LAD and Circumflex 02/14/16.   Dementia (HCC) dx'd 01/2016   GERD (gastroesophageal reflux disease)    History of stomach ulcers 1980s?   Ischemic cardiomyopathy    a. 01/2016: EF 40-45%.     Patient Active Problem List   Diagnosis Date Noted   Hyperlipidemia 06/15/2017    Alzheimer's dementia without behavioral disturbance (HCC) 12/11/2016   Sepsis (HCC) 03/10/2016   Pseudoaneurysm of right femoral artery (HCC) 02/23/2016   NSVT (nonsustained ventricular tachycardia) (HCC) 02/15/2016   CAD (coronary artery disease)    Ischemic cardiomyopathy    STEMI involving oth coronary artery of inferior wall (HCC) 02/10/2016   Abnormal urinalysis 10/16/2014    Past Surgical History:  Procedure Laterality Date   CARDIAC CATHETERIZATION N/A 02/10/2016   Procedure: Left Heart Cath and Coronary Angiography;  Surgeon: Runell Gess, MD;  Location: [facility] INVASIVE CV LAB;  Service: Cardiovascular;  Laterality: N/A;   CARDIAC CATHETERIZATION N/A 02/10/2016   Procedure: Coronary Stent Intervention;  Surgeon: Runell Gess, MD;  Location: [facility] INVASIVE CV LAB;  Service: Cardiovascular;  Laterality: N/A;   CARDIAC CATHETERIZATION N/A 02/14/2016   Procedure: Coronary Stent Intervention-LAD and CFX;  Surgeon: Runell Gess, MD;  Location: [facility] INVASIVE CV LAB;  Service: Cardiovascular;  Laterality: N/A;   COLONOSCOPY WITH PROPOFOL N/A 05/06/2015   Procedure: COLONOSCOPY WITH PROPOFOL;  Surgeon: Wallace Cullens, MD;  Location: [facility] ENDOSCOPY;  Service: Gastroenterology;  Laterality: N/A;   CORONARY ANGIOPLASTY WITH STENT PLACEMENT  02/14/2016   "2 stents"   ESOPHAGOGASTRODUODENOSCOPY (EGD) WITH PROPOFOL N/A 05/06/2015   Procedure: ESOPHAGOGASTRODUODENOSCOPY (EGD) WITH PROPOFOL;  Surgeon: Wallace Cullens, MD;  Location: [facility] ENDOSCOPY;  Service: Gastroenterology;  Laterality: N/A;   FOREARM FRACTURE SURGERY Right 1985   "got it  tore up in [place]"   FRACTURE SURGERY      Prior to Admission medications   Medication Sig Start Date End Date Taking? Authorizing Provider  aspirin EC 81 MG tablet Take 81 mg by mouth daily.    [provider]  atorvastatin (LIPITOR) 10 MG tablet Take 10 mg by mouth daily. 11/10/16   [provider]  BRILINTA 90 MG TABS tablet TAKE  ONE TABLET BY MOUTH TWICE DAILY 03/14/17   Lorretta Harp, MD  clopidogrel (PLAVIX) 75 MG tablet Take 1 tablet (75 mg total) by mouth daily. 06/15/17   Lorretta Harp, MD  ferrous sulfate 325 (65 FE) MG tablet Take 325 mg by mouth daily with breakfast.    [provider]  galantamine (RAZADYNE) 4 MG tablet Take 4 mg by mouth 2 (two) times daily with a meal.  01/31/16 01/30/17  [provider]  lisinopril (PRINIVIL,ZESTRIL) 10 MG tablet Take 1 tablet by mouth once daily 06/10/18   Lorretta Harp, MD  memantine (NAMENDA) 5 MG tablet Take 1 tablet by mouth 2 (two) times daily. 05/07/17 05/07/18  [provider]  metoprolol tartrate (LOPRESSOR) 25 MG tablet Take 1/2 (one-half) tablet by mouth twice daily 09/09/18   Lorretta Harp, MD  nitroGLYCERIN (NITROSTAT) 0.4 MG SL tablet Place 1 tablet (0.4 mg total) under the tongue every 5 (five) minutes as needed for chest pain. 02/15/16   Eileen Stanford, PA-C  omeprazole (PRILOSEC) 20 MG capsule Take 20 mg by mouth daily.    [provider]  vitamin B-12 (CYANOCOBALAMIN) 1000 MCG tablet Take 1,000 mcg by mouth daily.    [provider]    Allergies Patient has no known allergies.  Family History  Problem Relation Age of Onset   Prostate cancer Father    Bladder Cancer Neg Hx    Kidney cancer Neg Hx     Social History Social History   Tobacco Use   Smoking status: Former Smoker    Packs/day: 2.00    Years: 30.00    Pack years: 60.00    Types: Cigarettes    Quit date: 1990    Years since quitting: 30.9   Smokeless tobacco: Never Used  Substance Use Topics   Alcohol use: No    Alcohol/week: 0.0 standard drinks   Drug use: No      Review of Systems Constitutional: No fever/chills Eyes: No visual changes. ENT: No sore throat. Cardiovascular: Denies chest pain. Respiratory: Denies shortness of breath. Gastrointestinal: No abdominal pain.  No nausea, no vomiting.  Positive  diarrhea no constipation. Genitourinary: Negative for dysuria. Musculoskeletal: Negative for back pain. Skin: Negative for rash. Neurological: Negative for headaches, focal weakness or numbness.  Positive confusion All other ROS negative ____________________________________________   PHYSICAL EXAM:  VITAL SIGNS: ED Triage Vitals  Enc Vitals Group     BP 02/21/19 1111 113/62     Pulse Rate 02/21/19 1111 83     Resp 02/21/19 1111 17     Temp 02/21/19 1111 97.7 F (36.5 C)     Temp Source 02/21/19 1111 Axillary     SpO2 02/21/19 1111 100 %     Weight 02/21/19 1112 235 lb (106.6 kg)     Height 02/21/19 1112 5\' 11"  (1.803 m)     Head Circumference --      Peak Flow --      Pain Score --      Pain Loc --  Pain Edu? --      Excl. in GC? --     Constitutional: Alert and oriented x1. Eyes: Conjunctivae are normal. EOMI. Head: Atraumatic. Nose: No congestion/rhinnorhea. Mouth/Throat: Mucous membranes are moist.   Neck: No stridor. Trachea Midline. FROM Cardiovascular: Normal rate, regular rhythm. Grossly normal heart sounds.  Good peripheral circulation. Respiratory: Normal respiratory effort.  No retractions. Lungs CTAB. Gastrointestinal: Soft and nontender when I can tell.. No distention. No abdominal bruits.  Musculoskeletal: No lower extremity tenderness nor edema.  No joint effusions. Neurologic: Patient able to ambulate at baseline.  Oriented x1 which is baseline.  No gross deficits but patient unable to follow commands Skin:  Skin is warm, dry and intact. No rash noted. Psychiatric: Unable to fully assess due to dementia. GU: Deferred   ____________________________________________   LABS (all labs ordered are listed, but only abnormal results are displayed)  Labs Reviewed  BASIC METABOLIC PANEL - Abnormal; Notable for the following components:      Result Value   Sodium 134 (*)    Glucose, Bld 119 (*)    Calcium 8.4 (*)    All other components within normal  limits  CBC  URINALYSIS, COMPLETE (UACMP) WITH MICROSCOPIC  URINALYSIS, ROUTINE W REFLEX MICROSCOPIC   ____________________________________________   ED ECG REPORT I, Concha SeMary E Jeneane Pieczynski, the attending physician, personally viewed and interpreted this ECG.  EKG normal sinus rate of 87, no ST elevations, T wave inversion in lead III, normal intervals.  Similar to prior EKG 6 months ago ____________________________________________  RADI PROCEDURES  Procedure(s) performed (including Critical Care):  Procedures   ____________________________________________   INITIAL IMPRESSION / ASSESSMENT AND PLAN / ED COURSE  Jeff Key was evaluated in Emergency Department on 02/21/2019 for the symptoms described in the history of present illness. He was evaluated in the context of the global COVID-19 pandemic, which necessitated consideration that the patient might be at risk for infection with the SARS-CoV-2 virus that causes COVID-19. Institutional protocols and algorithms that pertain to the evaluation of patients at risk for COVID-19 are in a state of rapid change based on information released by regulatory bodies including the CDC and federal and state organizations. These policies and algorithms were followed during the patient's care in the ED.    Patient is a 73 year old gentleman who came in with known coronavirus and diarrhea.  Will get labs to evaluate for electrolyte abnormalities, AKI.  Will discuss with family how much testing they want to have done given patient due to his dementia is not really wanting to participate getting IV.  I discussed with family getting a CT head to rule out intracranial hemorrhage but they have denied any falls.  We also discussed doing a straight cath to make sure is no signs of UTI but again patient has no white count patient is afebrile.  I explained that I could give him medications to help sedate him so that we can facilitate additional work-up although I  suspect that his diarrhea is from the coronavirus.  Family wanted to hold off on further testing at this time and did not want to do any sedation.  They felt comfortable taking him home and they felt that he did not need to be placed in a care facility.  They were reassured that his vital signs all look good and that his electrolytes were all normal.  We offered getting a stool sample to send out for testing to make sure he did not have C. difficile  or any other infection but again he declined saying that would agitate him and so they said they could take a sample at his primary care doctor.  Given family not wanting any further work-up and patient's vital signs and basic labs were stable patient will be discharged home and we discussed return cautions.  Labs are reassuring with not any significant electrolyte abnormalities.  We will give a little bit of fluid and check a urine.      ____________________________________________   FINAL CLINICAL IMPRESSION(S) / ED DIAGNOSES   Final diagnoses:  COVID-19  Diarrhea of infectious origin      MEDICATIONS GIVEN DURING THIS VISIT:  Medications  sodium chloride flush (NS) 0.9 % injection 3 mL (has no administration in time range)  sodium chloride 0.9 % bolus 500 mL (has no administration in time range)     ED Discharge Orders    None       Note:  This document was prepared using Dragon voice recognition software and may include unintentional dictation errors.   Concha Se, MD 02/21/19 [phone]

## 2019-02-21 NOTE — ED Notes (Signed)
Pt brief noted to be heavily soiled. MD made aware . PT changed, clean and dry at this time

## 2019-02-21 NOTE — Discharge Instructions (Addendum)
Most likely the diarrhea is from his COVID-19 however he can follow-up for stool sample testing if it is continuing.  He does not have any signs of dehydration at this time.  Continue to try to push the fluids.  Return to the ER for worsening confusion, falls, any other concerns.

## 2019-04-08 ENCOUNTER — Ambulatory Visit: Payer: PPO | Admitting: Cardiovascular Disease

## 2019-04-11 ENCOUNTER — Ambulatory Visit: Payer: PPO | Admitting: Cardiovascular Disease

## 2019-04-21 ENCOUNTER — Telehealth: Payer: Self-pay | Admitting: Cardiovascular Disease

## 2019-04-21 NOTE — Telephone Encounter (Signed)
New message:   Patient wife calling stating that she has to come with her husband he needs assist.

## 2019-04-21 NOTE — Telephone Encounter (Signed)
Wife called wanting to come to appt with pt 2/2 tomorrow. States that he has dementia. Approved wife to come. Wife asked if daughter to come too. Advised that only she could come back due to policy. Verbalized understanding.

## 2019-04-22 ENCOUNTER — Other Ambulatory Visit: Payer: Self-pay

## 2019-04-22 ENCOUNTER — Ambulatory Visit (INDEPENDENT_AMBULATORY_CARE_PROVIDER_SITE_OTHER): Payer: PPO | Admitting: Cardiovascular Disease

## 2019-04-22 ENCOUNTER — Encounter: Payer: Self-pay | Admitting: Cardiovascular Disease

## 2019-04-22 DIAGNOSIS — I255 Ischemic cardiomyopathy: Secondary | ICD-10-CM | POA: Diagnosis not present

## 2019-04-22 DIAGNOSIS — E782 Mixed hyperlipidemia: Secondary | ICD-10-CM

## 2019-04-22 DIAGNOSIS — I2119 ST elevation (STEMI) myocardial infarction involving other coronary artery of inferior wall: Secondary | ICD-10-CM | POA: Diagnosis not present

## 2019-04-22 MED ORDER — ATORVASTATIN CALCIUM 20 MG PO TABS: 20.0000 mg | ORAL_TABLET | Freq: Every day | ORAL | 3 refills | Status: DC

## 2019-04-22 NOTE — Progress Notes (Signed)
04/22/2019 Jeff Key   February 17, 1946  [id number]  Primary Physician Jeff Reichmann, MD Primary Cardiologist: Runell Gess MD Milagros Loll, [place], [place]  HPI:  Jeff Key is a 74 y.o. Caucasian male father of 18, grandfather and 6 grandchildren who lives in [place] I last saw him in the office  03/22/2018.He has newly diagnosed Alzheimer's disease and was recently admitted to [facility] in September for pneumonia. He was brought into the ER on Thanksgiving day with nonspecific symptoms however he was complaining of chest pain apparently and an EKG showed inferior ST segment elevation. He was transferred by EMS to come in for angiography and urgent intervention. He had a totally occluded proximal dominant RCA stenosis which I stented using a synergy drug-eluting stent. He had residual mid LAD and circumflex disease. His EF was mildly depressed. Treated with aspirin and Brilenta has remained stable. His troponin peaked at 40. He underwent staged LAD and circumflex PCI and drug-eluting stenting. Right femoral approach on 02/15/16 and this was uncomplicated. He was discharged on the following day. Apparently he was seen in early follow-up in the office and a bruit was noted.He had a small pseudoaneurysm by duplex ultrasound performed 02/22/16. Follow-up ultrasound recently performed showed that this had spontaneously occluded. His EF likewise has somewhat improved from the 40-45% range back in November up to 45-50% recently by 2-D echocardiogram.    His Alzheimer's disease appears to have progressed as well.  He remains on dual antiplatelet therapy including low-dose aspirin and Plavix.  Since I saw him a year ago he is remained stable.  He denies chest pain or shortness of breath.   Current Meds  Medication Sig  . aspirin EC 81 MG tablet Take 81 mg by mouth daily.  Marland Kitchen atorvastatin (LIPITOR) 10 MG tablet Take 10 mg by mouth daily.  Marland Kitchen BRILINTA 90 MG TABS tablet TAKE ONE TABLET BY MOUTH  TWICE DAILY  . clopidogrel (PLAVIX) 75 MG tablet Take 1 tablet (75 mg total) by mouth daily.  . ferrous sulfate 325 (65 FE) MG tablet Take 325 mg by mouth daily with breakfast.  . galantamine (RAZADYNE) 4 MG tablet Take 4 mg by mouth 2 (two) times daily with a meal.   . lisinopril (PRINIVIL,ZESTRIL) 10 MG tablet Take 1 tablet by mouth once daily  . memantine (NAMENDA) 5 MG tablet Take 1 tablet by mouth 2 (two) times daily.  . metoprolol tartrate (LOPRESSOR) 25 MG tablet Take 1/2 (one-half) tablet by mouth twice daily  . nitroGLYCERIN (NITROSTAT) 0.4 MG SL tablet Place 1 tablet (0.4 mg total) under the tongue every 5 (five) minutes as needed for chest pain.  Marland Kitchen omeprazole (PRILOSEC) 20 MG capsule Take 20 mg by mouth daily.  . vitamin B-12 (CYANOCOBALAMIN) 1000 MCG tablet Take 1,000 mcg by mouth daily.     No Known Allergies  Social History   Socioeconomic History  . Marital status: Married    Spouse name: Not on file  . Number of children: Not on file  . Years of education: Not on file  . Highest education level: Not on file  Occupational History  . Not on file  Tobacco Use  . Smoking status: Former Smoker    Packs/day: 2.00    Years: 30.00    Pack years: 60.00    Types: Cigarettes    Quit date: 1990    Years since quitting: 31.1  . Smokeless tobacco: Never Used  Substance and Sexual Activity  .  Alcohol use: No    Alcohol/week: 0.0 standard drinks  . Drug use: No  . Sexual activity: Never  Other Topics Concern  . Not on file  Social History Narrative  . Not on file   Social Determinants of Health   Financial Resource Strain:   . Difficulty of Paying Living Expenses: Not on file  Food Insecurity:   . Worried About Programme researcher, broadcasting/film/video in the Last Year: Not on file  . Ran Out of Food in the Last Year: Not on file  Transportation Needs:   . Lack of Transportation (Medical): Not on file  . Lack of Transportation (Non-Medical): Not on file  Physical Activity:   . Days of  Exercise per Week: Not on file  . Minutes of Exercise per Session: Not on file  Stress:   . Feeling of Stress : Not on file  Social Connections:   . Frequency of Communication with Friends and Family: Not on file  . Frequency of Social Gatherings with Friends and Family: Not on file  . Attends Religious Services: Not on file  . Active Member of Clubs or Organizations: Not on file  . Attends Banker Meetings: Not on file  . Marital Status: Not on file  Intimate Partner Violence:   . Fear of Current or Ex-Partner: Not on file  . Emotionally Abused: Not on file  . Physically Abused: Not on file  . Sexually Abused: Not on file     Review of Systems: General: negative for chills, fever, night sweats or weight changes.  Cardiovascular: negative for chest pain, dyspnea on exertion, edema, orthopnea, palpitations, paroxysmal nocturnal dyspnea or shortness of breath Dermatological: negative for rash Respiratory: negative for cough or wheezing Urologic: negative for hematuria Abdominal: negative for nausea, vomiting, diarrhea, bright red blood per rectum, melena, or hematemesis Neurologic: negative for visual changes, syncope, or dizziness All other systems reviewed and are otherwise negative except as noted above.    Blood pressure 138/72, pulse 66, height 5\' 11"  (1.803 m), weight 238 lb 6.4 oz (108.1 kg), SpO2 95 %.  General appearance: alert and no distress Neck: no adenopathy, no carotid bruit, no JVD, supple, symmetrical, trachea midline and thyroid not enlarged, symmetric, no tenderness/mass/nodules Lungs: clear to auscultation bilaterally Heart: regular rate and rhythm, S1, S2 normal, no murmur, click, rub or gallop Extremities: extremities normal, atraumatic, no cyanosis or edema Pulses: 2+ and symmetric Skin: Skin color, texture, turgor normal. No rashes or lesions Neurologic: Alert and oriented X 3, normal strength and tone. Normal symmetric reflexes. Normal  coordination and gait  EKG not performed today  ASSESSMENT AND PLAN:   STEMI involving oth coronary artery of inferior wall (HCC) History of CAD status post inferior STEMI Thanksgiving day 2017 cardiac catheterization performed by myself revealing occluded RCA with PCI drug-eluting stenting using synergy drug-eluting stent.  Several days later he underwent staged LAD and circumflex intervention via the femoral approach complicated by a small pseudoaneurysm which spontaneously closed.  He remains on aspirin Plavix.  He denies chest pain or shortness of breath.  Ischemic cardiomyopathy History of ischemic cardiomyopathy with an EF in the 40 to 45% range asymptomatic  Hyperlipidemia History of hyperlipidemia on low-dose atorvastatin with recent lipid profile performed 12/26/2018 revealed an LDL of 84.  I am going to increase his atorvastatin from 10 to 20 mg a day and we will recheck a lipid liver profile in 2 months.  I will see him back in 1 year for  follow-up.      Lorretta Harp MD FACP,FACC,FAHA, [facility] 04/22/2019 2:48 PM

## 2019-04-22 NOTE — Assessment & Plan Note (Signed)
History of hyperlipidemia on low-dose atorvastatin with recent lipid profile performed 12/26/2018 revealed an LDL of 84.  I am going to increase his atorvastatin from 10 to 20 mg a day and we will recheck a lipid liver profile in 2 months.  I will see him back in 1 year for follow-up.

## 2019-04-22 NOTE — Patient Instructions (Signed)
Medication Instructions:  Increase Atorvastatin to 20mg  Daily  If you need a refill on your cardiac medications before your next appointment, please call your pharmacy.   Lab work: Fasting Lipids and hepatic function in 2 months If you have labs (blood work) drawn today and your tests are completely normal, you will receive your results only by: MyChart Message (if you have MyChart) OR A paper copy in the mail If you have any lab test that is abnormal or we need to change your treatment, we will call you to review the results.  Testing/Procedures: NONE  Follow-Up: At [facility], you and your health needs are our priority.  As part of our continuing mission to provide you with exceptional heart care, we have created designated Provider Care Teams.  These Care Teams include your primary Cardiologist (physician) and Advanced Practice Providers (APPs -  Physician Assistants and Nurse Practitioners) who all work together to provide you with the care you need, when you need it. You may see [facility], MD or one of the following Advanced Practice Providers on your designated Care Team:    Nanetta Batty, PA-C  [place], [place]  [place], Edd Fabian  Your physician wants you to follow-up in: 1 year with Dr. [place]

## 2019-04-22 NOTE — Assessment & Plan Note (Signed)
History of ischemic cardiomyopathy with an EF in the 40 to 45% range asymptomatic

## 2019-04-22 NOTE — Assessment & Plan Note (Signed)
History of CAD status post inferior STEMI Thanksgiving day 2017 cardiac catheterization performed by myself revealing occluded RCA with PCI drug-eluting stenting using synergy drug-eluting stent.  Several days later he underwent staged LAD and circumflex intervention via the femoral approach complicated by a small pseudoaneurysm which spontaneously closed.  He remains on aspirin Plavix.  He denies chest pain or shortness of breath.

## 2019-04-28 DIAGNOSIS — F0280 Dementia in other diseases classified elsewhere without behavioral disturbance: Secondary | ICD-10-CM | POA: Diagnosis not present

## 2019-04-28 DIAGNOSIS — G309 Alzheimer's disease, unspecified: Secondary | ICD-10-CM | POA: Diagnosis not present

## 2019-04-28 DIAGNOSIS — F0150 Vascular dementia without behavioral disturbance: Secondary | ICD-10-CM | POA: Diagnosis not present

## 2019-06-09 ENCOUNTER — Other Ambulatory Visit: Payer: Self-pay | Admitting: Cardiovascular Disease

## 2019-06-16 DIAGNOSIS — F329 Major depressive disorder, single episode, unspecified: Secondary | ICD-10-CM | POA: Diagnosis not present

## 2019-06-16 DIAGNOSIS — I2510 Atherosclerotic heart disease of native coronary artery without angina pectoris: Secondary | ICD-10-CM | POA: Diagnosis not present

## 2019-06-16 DIAGNOSIS — G308 Other Alzheimer's disease: Secondary | ICD-10-CM | POA: Diagnosis not present

## 2019-06-16 DIAGNOSIS — F0150 Vascular dementia without behavioral disturbance: Secondary | ICD-10-CM | POA: Diagnosis not present

## 2019-06-16 DIAGNOSIS — K219 Gastro-esophageal reflux disease without esophagitis: Secondary | ICD-10-CM | POA: Diagnosis not present

## 2019-06-16 DIAGNOSIS — Z7982 Long term (current) use of aspirin: Secondary | ICD-10-CM | POA: Diagnosis not present

## 2019-06-16 DIAGNOSIS — Z9181 History of falling: Secondary | ICD-10-CM | POA: Diagnosis not present

## 2019-06-16 DIAGNOSIS — F0280 Dementia in other diseases classified elsewhere without behavioral disturbance: Secondary | ICD-10-CM | POA: Diagnosis not present

## 2019-06-16 DIAGNOSIS — Z79899 Other long term (current) drug therapy: Secondary | ICD-10-CM | POA: Diagnosis not present

## 2019-06-16 DIAGNOSIS — E785 Hyperlipidemia, unspecified: Secondary | ICD-10-CM | POA: Diagnosis not present

## 2019-06-16 DIAGNOSIS — M1990 Unspecified osteoarthritis, unspecified site: Secondary | ICD-10-CM | POA: Diagnosis not present

## 2019-06-16 DIAGNOSIS — I10 Essential (primary) hypertension: Secondary | ICD-10-CM | POA: Diagnosis not present

## 2019-06-23 DIAGNOSIS — G308 Other Alzheimer's disease: Secondary | ICD-10-CM | POA: Diagnosis not present

## 2019-06-23 DIAGNOSIS — Z7982 Long term (current) use of aspirin: Secondary | ICD-10-CM | POA: Diagnosis not present

## 2019-06-23 DIAGNOSIS — I2510 Atherosclerotic heart disease of native coronary artery without angina pectoris: Secondary | ICD-10-CM | POA: Diagnosis not present

## 2019-06-23 DIAGNOSIS — K219 Gastro-esophageal reflux disease without esophagitis: Secondary | ICD-10-CM | POA: Diagnosis not present

## 2019-06-23 DIAGNOSIS — M1990 Unspecified osteoarthritis, unspecified site: Secondary | ICD-10-CM | POA: Diagnosis not present

## 2019-06-23 DIAGNOSIS — Z79899 Other long term (current) drug therapy: Secondary | ICD-10-CM | POA: Diagnosis not present

## 2019-06-23 DIAGNOSIS — F0280 Dementia in other diseases classified elsewhere without behavioral disturbance: Secondary | ICD-10-CM | POA: Diagnosis not present

## 2019-06-23 DIAGNOSIS — Z9181 History of falling: Secondary | ICD-10-CM | POA: Diagnosis not present

## 2019-06-23 DIAGNOSIS — F329 Major depressive disorder, single episode, unspecified: Secondary | ICD-10-CM | POA: Diagnosis not present

## 2019-06-23 DIAGNOSIS — I10 Essential (primary) hypertension: Secondary | ICD-10-CM | POA: Diagnosis not present

## 2019-06-23 DIAGNOSIS — F0150 Vascular dementia without behavioral disturbance: Secondary | ICD-10-CM | POA: Diagnosis not present

## 2019-06-23 DIAGNOSIS — E785 Hyperlipidemia, unspecified: Secondary | ICD-10-CM | POA: Diagnosis not present

## 2019-06-26 DIAGNOSIS — F0150 Vascular dementia without behavioral disturbance: Secondary | ICD-10-CM | POA: Diagnosis not present

## 2019-06-26 DIAGNOSIS — R413 Other amnesia: Secondary | ICD-10-CM | POA: Diagnosis not present

## 2019-06-26 DIAGNOSIS — R739 Hyperglycemia, unspecified: Secondary | ICD-10-CM | POA: Diagnosis not present

## 2019-06-26 DIAGNOSIS — G309 Alzheimer's disease, unspecified: Secondary | ICD-10-CM | POA: Diagnosis not present

## 2019-06-26 DIAGNOSIS — D649 Anemia, unspecified: Secondary | ICD-10-CM | POA: Diagnosis not present

## 2019-06-26 DIAGNOSIS — Z23 Encounter for immunization: Secondary | ICD-10-CM | POA: Diagnosis not present

## 2019-06-26 DIAGNOSIS — Z125 Encounter for screening for malignant neoplasm of prostate: Secondary | ICD-10-CM | POA: Diagnosis not present

## 2019-06-26 DIAGNOSIS — F0280 Dementia in other diseases classified elsewhere without behavioral disturbance: Secondary | ICD-10-CM | POA: Diagnosis not present

## 2019-06-26 DIAGNOSIS — Z79899 Other long term (current) drug therapy: Secondary | ICD-10-CM | POA: Diagnosis not present

## 2019-06-26 DIAGNOSIS — I2510 Atherosclerotic heart disease of native coronary artery without angina pectoris: Secondary | ICD-10-CM | POA: Diagnosis not present

## 2019-06-30 DIAGNOSIS — I2510 Atherosclerotic heart disease of native coronary artery without angina pectoris: Secondary | ICD-10-CM | POA: Diagnosis not present

## 2019-06-30 DIAGNOSIS — M1990 Unspecified osteoarthritis, unspecified site: Secondary | ICD-10-CM | POA: Diagnosis not present

## 2019-06-30 DIAGNOSIS — K219 Gastro-esophageal reflux disease without esophagitis: Secondary | ICD-10-CM | POA: Diagnosis not present

## 2019-06-30 DIAGNOSIS — F329 Major depressive disorder, single episode, unspecified: Secondary | ICD-10-CM | POA: Diagnosis not present

## 2019-06-30 DIAGNOSIS — I10 Essential (primary) hypertension: Secondary | ICD-10-CM | POA: Diagnosis not present

## 2019-06-30 DIAGNOSIS — Z79899 Other long term (current) drug therapy: Secondary | ICD-10-CM | POA: Diagnosis not present

## 2019-06-30 DIAGNOSIS — F0280 Dementia in other diseases classified elsewhere without behavioral disturbance: Secondary | ICD-10-CM | POA: Diagnosis not present

## 2019-06-30 DIAGNOSIS — G308 Other Alzheimer's disease: Secondary | ICD-10-CM | POA: Diagnosis not present

## 2019-06-30 DIAGNOSIS — E785 Hyperlipidemia, unspecified: Secondary | ICD-10-CM | POA: Diagnosis not present

## 2019-06-30 DIAGNOSIS — Z9181 History of falling: Secondary | ICD-10-CM | POA: Diagnosis not present

## 2019-06-30 DIAGNOSIS — Z7982 Long term (current) use of aspirin: Secondary | ICD-10-CM | POA: Diagnosis not present

## 2019-06-30 DIAGNOSIS — F0150 Vascular dementia without behavioral disturbance: Secondary | ICD-10-CM | POA: Diagnosis not present

## 2019-07-03 DIAGNOSIS — G309 Alzheimer's disease, unspecified: Secondary | ICD-10-CM | POA: Diagnosis not present

## 2019-07-03 DIAGNOSIS — F0280 Dementia in other diseases classified elsewhere without behavioral disturbance: Secondary | ICD-10-CM | POA: Diagnosis not present

## 2019-07-03 DIAGNOSIS — Z0000 Encounter for general adult medical examination without abnormal findings: Secondary | ICD-10-CM | POA: Diagnosis not present

## 2019-07-03 DIAGNOSIS — E538 Deficiency of other specified B group vitamins: Secondary | ICD-10-CM | POA: Diagnosis not present

## 2019-07-03 DIAGNOSIS — I2510 Atherosclerotic heart disease of native coronary artery without angina pectoris: Secondary | ICD-10-CM | POA: Diagnosis not present

## 2019-07-03 DIAGNOSIS — F0150 Vascular dementia without behavioral disturbance: Secondary | ICD-10-CM | POA: Diagnosis not present

## 2019-07-03 DIAGNOSIS — Z6833 Body mass index (BMI) 33.0-33.9, adult: Secondary | ICD-10-CM | POA: Diagnosis not present

## 2019-07-03 DIAGNOSIS — K51311 Ulcerative (chronic) rectosigmoiditis with rectal bleeding: Secondary | ICD-10-CM | POA: Diagnosis not present

## 2019-07-03 DIAGNOSIS — F330 Major depressive disorder, recurrent, mild: Secondary | ICD-10-CM | POA: Diagnosis not present

## 2019-07-08 DIAGNOSIS — M1990 Unspecified osteoarthritis, unspecified site: Secondary | ICD-10-CM | POA: Diagnosis not present

## 2019-07-08 DIAGNOSIS — G308 Other Alzheimer's disease: Secondary | ICD-10-CM | POA: Diagnosis not present

## 2019-07-08 DIAGNOSIS — F0280 Dementia in other diseases classified elsewhere without behavioral disturbance: Secondary | ICD-10-CM | POA: Diagnosis not present

## 2019-07-08 DIAGNOSIS — Z79899 Other long term (current) drug therapy: Secondary | ICD-10-CM | POA: Diagnosis not present

## 2019-07-08 DIAGNOSIS — K219 Gastro-esophageal reflux disease without esophagitis: Secondary | ICD-10-CM | POA: Diagnosis not present

## 2019-07-08 DIAGNOSIS — Z9181 History of falling: Secondary | ICD-10-CM | POA: Diagnosis not present

## 2019-07-08 DIAGNOSIS — Z7982 Long term (current) use of aspirin: Secondary | ICD-10-CM | POA: Diagnosis not present

## 2019-07-08 DIAGNOSIS — I2510 Atherosclerotic heart disease of native coronary artery without angina pectoris: Secondary | ICD-10-CM | POA: Diagnosis not present

## 2019-07-08 DIAGNOSIS — E785 Hyperlipidemia, unspecified: Secondary | ICD-10-CM | POA: Diagnosis not present

## 2019-07-08 DIAGNOSIS — F0150 Vascular dementia without behavioral disturbance: Secondary | ICD-10-CM | POA: Diagnosis not present

## 2019-07-08 DIAGNOSIS — I10 Essential (primary) hypertension: Secondary | ICD-10-CM | POA: Diagnosis not present

## 2019-07-08 DIAGNOSIS — F329 Major depressive disorder, single episode, unspecified: Secondary | ICD-10-CM | POA: Diagnosis not present

## 2019-07-09 DIAGNOSIS — Z9181 History of falling: Secondary | ICD-10-CM | POA: Diagnosis not present

## 2019-07-09 DIAGNOSIS — F0150 Vascular dementia without behavioral disturbance: Secondary | ICD-10-CM | POA: Diagnosis not present

## 2019-07-09 DIAGNOSIS — G308 Other Alzheimer's disease: Secondary | ICD-10-CM | POA: Diagnosis not present

## 2019-07-09 DIAGNOSIS — Z7982 Long term (current) use of aspirin: Secondary | ICD-10-CM | POA: Diagnosis not present

## 2019-07-09 DIAGNOSIS — Z79899 Other long term (current) drug therapy: Secondary | ICD-10-CM | POA: Diagnosis not present

## 2019-07-09 DIAGNOSIS — I2510 Atherosclerotic heart disease of native coronary artery without angina pectoris: Secondary | ICD-10-CM | POA: Diagnosis not present

## 2019-07-09 DIAGNOSIS — I10 Essential (primary) hypertension: Secondary | ICD-10-CM | POA: Diagnosis not present

## 2019-07-09 DIAGNOSIS — M1990 Unspecified osteoarthritis, unspecified site: Secondary | ICD-10-CM | POA: Diagnosis not present

## 2019-07-09 DIAGNOSIS — E785 Hyperlipidemia, unspecified: Secondary | ICD-10-CM | POA: Diagnosis not present

## 2019-07-09 DIAGNOSIS — F0280 Dementia in other diseases classified elsewhere without behavioral disturbance: Secondary | ICD-10-CM | POA: Diagnosis not present

## 2019-07-09 DIAGNOSIS — K219 Gastro-esophageal reflux disease without esophagitis: Secondary | ICD-10-CM | POA: Diagnosis not present

## 2019-07-09 DIAGNOSIS — F329 Major depressive disorder, single episode, unspecified: Secondary | ICD-10-CM | POA: Diagnosis not present

## 2019-07-15 DIAGNOSIS — F329 Major depressive disorder, single episode, unspecified: Secondary | ICD-10-CM | POA: Diagnosis not present

## 2019-07-15 DIAGNOSIS — I10 Essential (primary) hypertension: Secondary | ICD-10-CM | POA: Diagnosis not present

## 2019-07-15 DIAGNOSIS — F0280 Dementia in other diseases classified elsewhere without behavioral disturbance: Secondary | ICD-10-CM | POA: Diagnosis not present

## 2019-07-15 DIAGNOSIS — Z79899 Other long term (current) drug therapy: Secondary | ICD-10-CM | POA: Diagnosis not present

## 2019-07-15 DIAGNOSIS — F0150 Vascular dementia without behavioral disturbance: Secondary | ICD-10-CM | POA: Diagnosis not present

## 2019-07-15 DIAGNOSIS — E785 Hyperlipidemia, unspecified: Secondary | ICD-10-CM | POA: Diagnosis not present

## 2019-07-15 DIAGNOSIS — K219 Gastro-esophageal reflux disease without esophagitis: Secondary | ICD-10-CM | POA: Diagnosis not present

## 2019-07-15 DIAGNOSIS — G308 Other Alzheimer's disease: Secondary | ICD-10-CM | POA: Diagnosis not present

## 2019-07-15 DIAGNOSIS — I2510 Atherosclerotic heart disease of native coronary artery without angina pectoris: Secondary | ICD-10-CM | POA: Diagnosis not present

## 2019-07-15 DIAGNOSIS — M1990 Unspecified osteoarthritis, unspecified site: Secondary | ICD-10-CM | POA: Diagnosis not present

## 2019-07-15 DIAGNOSIS — Z9181 History of falling: Secondary | ICD-10-CM | POA: Diagnosis not present

## 2019-07-15 DIAGNOSIS — Z7982 Long term (current) use of aspirin: Secondary | ICD-10-CM | POA: Diagnosis not present

## 2019-07-23 DIAGNOSIS — R399 Unspecified symptoms and signs involving the genitourinary system: Secondary | ICD-10-CM | POA: Diagnosis not present

## 2019-07-23 DIAGNOSIS — R8290 Unspecified abnormal findings in urine: Secondary | ICD-10-CM | POA: Diagnosis not present

## 2019-08-11 DIAGNOSIS — G309 Alzheimer's disease, unspecified: Secondary | ICD-10-CM | POA: Diagnosis not present

## 2019-08-11 DIAGNOSIS — K2100 Gastro-esophageal reflux disease with esophagitis, without bleeding: Secondary | ICD-10-CM | POA: Diagnosis not present

## 2019-08-11 DIAGNOSIS — Z7901 Long term (current) use of anticoagulants: Secondary | ICD-10-CM | POA: Diagnosis not present

## 2019-08-11 DIAGNOSIS — F0150 Vascular dementia without behavioral disturbance: Secondary | ICD-10-CM | POA: Diagnosis not present

## 2019-08-11 DIAGNOSIS — F0280 Dementia in other diseases classified elsewhere without behavioral disturbance: Secondary | ICD-10-CM | POA: Diagnosis not present

## 2019-08-11 DIAGNOSIS — Z9889 Other specified postprocedural states: Secondary | ICD-10-CM | POA: Diagnosis not present

## 2019-08-11 DIAGNOSIS — Z8719 Personal history of other diseases of the digestive system: Secondary | ICD-10-CM | POA: Diagnosis not present

## 2019-08-11 DIAGNOSIS — K625 Hemorrhage of anus and rectum: Secondary | ICD-10-CM | POA: Diagnosis not present

## 2019-08-11 DIAGNOSIS — Z7902 Long term (current) use of antithrombotics/antiplatelets: Secondary | ICD-10-CM | POA: Diagnosis not present

## 2019-08-12 DIAGNOSIS — K625 Hemorrhage of anus and rectum: Secondary | ICD-10-CM | POA: Diagnosis not present

## 2019-08-18 ENCOUNTER — Emergency Department
Admission: EM | Admit: 2019-08-18 | Discharge: 2019-08-18 | Disposition: A | Discharge status: Home / Self Care | Payer: PPO | Attending: Student in an Organized Health Care Education/Training Program | Admitting: Student in an Organized Health Care Education/Training Program

## 2019-08-18 ENCOUNTER — Other Ambulatory Visit: Payer: Self-pay

## 2019-08-18 ENCOUNTER — Emergency Department: Payer: PPO

## 2019-08-18 DIAGNOSIS — F0280 Dementia in other diseases classified elsewhere without behavioral disturbance: Secondary | ICD-10-CM | POA: Diagnosis not present

## 2019-08-18 DIAGNOSIS — Y9259 Other trade areas as the place of occurrence of the external cause: Secondary | ICD-10-CM | POA: Diagnosis not present

## 2019-08-18 DIAGNOSIS — Z87891 Personal history of nicotine dependence: Secondary | ICD-10-CM | POA: Diagnosis not present

## 2019-08-18 DIAGNOSIS — I252 Old myocardial infarction: Secondary | ICD-10-CM | POA: Insufficient documentation

## 2019-08-18 DIAGNOSIS — W010XXA Fall on same level from slipping, tripping and stumbling without subsequent striking against object, initial encounter: Secondary | ICD-10-CM | POA: Diagnosis not present

## 2019-08-18 DIAGNOSIS — S0101XA Laceration without foreign body of scalp, initial encounter: Secondary | ICD-10-CM | POA: Diagnosis not present

## 2019-08-18 DIAGNOSIS — W19XXXA Unspecified fall, initial encounter: Secondary | ICD-10-CM | POA: Diagnosis not present

## 2019-08-18 DIAGNOSIS — Z7901 Long term (current) use of anticoagulants: Secondary | ICD-10-CM | POA: Insufficient documentation

## 2019-08-18 DIAGNOSIS — Z7982 Long term (current) use of aspirin: Secondary | ICD-10-CM | POA: Insufficient documentation

## 2019-08-18 DIAGNOSIS — R410 Disorientation, unspecified: Secondary | ICD-10-CM | POA: Diagnosis not present

## 2019-08-18 DIAGNOSIS — G309 Alzheimer's disease, unspecified: Secondary | ICD-10-CM | POA: Diagnosis not present

## 2019-08-18 DIAGNOSIS — Y939 Activity, unspecified: Secondary | ICD-10-CM | POA: Diagnosis not present

## 2019-08-18 DIAGNOSIS — Y999 Unspecified external cause status: Secondary | ICD-10-CM | POA: Insufficient documentation

## 2019-08-18 DIAGNOSIS — Z79899 Other long term (current) drug therapy: Secondary | ICD-10-CM | POA: Diagnosis not present

## 2019-08-18 DIAGNOSIS — R58 Hemorrhage, not elsewhere classified: Secondary | ICD-10-CM | POA: Diagnosis not present

## 2019-08-18 DIAGNOSIS — S0003XA Contusion of scalp, initial encounter: Secondary | ICD-10-CM | POA: Diagnosis not present

## 2019-08-18 NOTE — ED Triage Notes (Addendum)
Per EMS report, wife states patient stumbled and fell at the [facility]. No loc reported. Patient has laceration to right, back of head. No other reported injuries. Patient has dementia.

## 2019-08-18 NOTE — ED Provider Notes (Signed)
[facility] Emergency Department Provider Note   ____________________________________________   First MD Initiated Contact with Patient 08/18/19 1316     (approximate)  I have reviewed the triage vital signs and the nursing notes.   HISTORY Via wife secondary to patient dementia Chief Complaint Fall    HPI Jeff Key is a 74 y.o. male patient arrived via EMS secondary to a stumble and fall at the [facility] store.  Patient fell backwards hitting the cervical area of his scalp.  No LOC reported.  Patient sustained a laceration to the right parietal l area of the scalp.  Bleeding is now controlled.  History is from wife secondary to patient mental status.  Wife stated patient is taking a blood thinner.         Past Medical History:  Diagnosis Date  . Anemia ~ 11/2015  . CAD (coronary artery disease)    a. inf STEMI on 02/10/16. RCA treated with DES followed by staged PCI of LAD and Circumflex 02/14/16.  Marland Kitchen Dementia (HCC) dx'd 01/2016  . GERD (gastroesophageal reflux disease)   . History of stomach ulcers 1980s?  . Ischemic cardiomyopathy    a. 01/2016: EF 40-45%.     Patient Active Problem List   Diagnosis Date Noted  . Hyperlipidemia 06/15/2017  . Alzheimer's dementia without behavioral disturbance (HCC) 12/11/2016  . Sepsis (HCC) 03/10/2016  . 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 NSVT (nonsustained ventricular tachycardia) (HCC) 02/15/2016  . CAD (coronary artery disease)   . Ischemic cardiomyopathy   . STEMI involving oth coronary artery of inferior wall (HCC) 02/10/2016  . Abnormal urinalysis 10/16/2014    Past Surgical History:  Procedure Laterality Date  . CARDIAC CATHETERIZATION N/A 02/10/2016   Procedure: Left Heart Cath and Coronary Angiography;  Surgeon: Runell Gess, MD;  Location: [facility] INVASIVE CV LAB;  Service: Cardiovascular;  Laterality: N/A;  . CARDIAC CATHETERIZATION N/A 02/10/2016   Procedure:  Coronary Stent Intervention;  Surgeon: Runell Gess, MD;  Location: [facility] INVASIVE CV LAB;  Service: Cardiovascular;  Laterality: N/A;  . CARDIAC CATHETERIZATION N/A 02/14/2016   Procedure: Coronary Stent Intervention-LAD and CFX;  Surgeon: Runell Gess, MD;  Location: [facility] INVASIVE CV LAB;  Service: Cardiovascular;  Laterality: N/A;  . COLONOSCOPY WITH PROPOFOL N/A 05/06/2015   Procedure: COLONOSCOPY WITH PROPOFOL;  Surgeon: Wallace Cullens, MD;  Location: [facility] ENDOSCOPY;  Service: Gastroenterology;  Laterality: N/A;  . CORONARY ANGIOPLASTY WITH STENT PLACEMENT  02/14/2016   "2 stents"  . ESOPHAGOGASTRODUODENOSCOPY (EGD) WITH PROPOFOL N/A 05/06/2015   Procedure: ESOPHAGOGASTRODUODENOSCOPY (EGD) WITH PROPOFOL;  Surgeon: Wallace Cullens, MD;  Location: [facility] ENDOSCOPY;  Service: Gastroenterology;  Laterality: N/A;  . FOREARM FRACTURE SURGERY Right 1985   "got it tore up in [place]"  . FRACTURE SURGERY      Prior to Admission medications   Medication Sig Start Date End Date Taking? Authorizing Provider  aspirin EC 81 MG tablet Take 81 mg by mouth daily.    [provider]  atorvastatin (LIPITOR) 20 MG tablet Take 1 tablet (20 mg total) by mouth daily. 04/22/19   Runell Gess, MD  BRILINTA 90 MG TABS tablet TAKE ONE TABLET BY MOUTH TWICE DAILY 03/14/17   Runell Gess, MD  clopidogrel (PLAVIX) 75 MG tablet Take 1 tablet (75 mg total) by mouth daily. 06/15/17   Runell Gess, MD  ferrous sulfate 325 (65 FE) MG tablet Take 325 mg by mouth daily with breakfast.  [provider]  galantamine (RAZADYNE) 4 MG tablet Take 4 mg by mouth 2 (two) times daily with a meal.  01/31/16 04/22/19  [provider]  lisinopril (PRINIVIL,ZESTRIL) 10 MG tablet Take 1 tablet by mouth once daily 06/10/18   Runell Gess, MD  memantine (NAMENDA) 5 MG tablet Take 1 tablet by mouth 2 (two) times daily. 05/07/17 04/22/19  [provider]  metoprolol tartrate (LOPRESSOR) 25 MG tablet Take  1 tablet (25 mg total) by mouth 2 (two) times daily. 06/10/19   Runell Gess, MD  nitroGLYCERIN (NITROSTAT) 0.4 MG SL tablet Place 1 tablet (0.4 mg total) under the tongue every 5 (five) minutes as needed for chest pain. 02/15/16   Janetta Hora, PA-C  omeprazole (PRILOSEC) 20 MG capsule Take 20 mg by mouth daily.    [provider]  vitamin B-12 (CYANOCOBALAMIN) 1000 MCG tablet Take 1,000 mcg by mouth daily.    [provider]    Allergies Patient has no known allergies.  Family History  Problem Relation Age of Onset  . Prostate cancer Father   . Bladder Cancer Neg Hx   . Kidney cancer Neg Hx     Social History Social History   Tobacco Use  . Smoking status: Former Smoker    Packs/day: 2.00    Years: 30.00    Pack years: 60.00    Types: Cigarettes    Quit date: 1990    Years since quitting: 31.4  . Smokeless tobacco: Never Used  Substance Use Topics  . Alcohol use: No    Alcohol/week: 0.0 standard drinks  . Drug use: No    Review of Systems Constitutional: No fever/chills Eyes: No visual changes. ENT: No sore throat. Cardiovascular: Denies chest pain. Respiratory: Denies shortness of breath. Gastrointestinal: No abdominal pain.  No nausea, no vomiting.  No diarrhea.  No constipation. Genitourinary: Negative for dysuria. Musculoskeletal: Negative for back pain. Skin: Negative for rash. Neurological: Negative for headaches, focal weakness or numbness.   Psychiatric:  Alzheimer's dementia. Endocrine:  Hyperlipidemia  ____________________________________________   PHYSICAL EXAM:  VITAL SIGNS: ED Triage Vitals  Enc Vitals Group     BP 08/18/19 1250 113/75     Pulse Rate 08/18/19 1250 70     Resp 08/18/19 1250 16     Temp 08/18/19 1250 98.4 F (36.9 C)     Temp src --      SpO2 08/18/19 1250 96 %     Weight 08/18/19 1254 239 lb (108.4 kg)     Height --      Head Circumference --      Peak Flow --      Pain Score --      Pain  Loc --      Pain Edu? --      Excl. in GC? --    Constitutional: Alert. Well appearing and in no acute distress. Eyes: Conjunctivae are normal. PERRL. EOMI. Head: Atraumatic.  Right parietal l laceration hematoma. Nose: No congestion/rhinnorhea. Mouth/Throat: Mucous membranes are moist.  Oropharynx non-erythematous. Neck:No cervical spine tenderness to palpation. Cardiovascular: Normal rate, regular rhythm. Grossly normal heart sounds.  Good peripheral circulation. Respiratory: Normal respiratory effort.  No retractions. Lungs CTAB. Skin: 0.5 cm scalp laceration.  Bleeding controlled with direct pressure.   ____________________________________________   LABS (all labs ordered are listed, but only abnormal results are displayed)  Labs Reviewed - No data to display ____________________________________________  EKG   ____________________________________________  RADIOLOGY  ED MD  interpretation:    Official radiology report(s): CT Head Wo Contrast  Result Date: 08/18/2019 CLINICAL DATA:  Pain following fall EXAM: CT HEAD WITHOUT CONTRAST TECHNIQUE: Contiguous axial images were obtained from the base of the skull through the vertex without intravenous contrast. COMPARISON:  November 28, 2015. FINDINGS: Brain: There is moderate diffuse atrophy. There is no intracranial mass, hemorrhage, extra-axial fluid collection, or midline shift. There is patchy small vessel disease in the centra semiovale bilaterally. No acute infarct is demonstrable. Vascular: There is no hyperdense vessel. There is calcification in the distal left vertebral artery as well as in the carotid siphon regions bilaterally. Skull: The bony calvarium appears intact. There is a right parietal scalp hematoma. Sinuses/Orbits: Mild mucosal thickening is noted in several ethmoid air cells. Other visualized paranasal sinuses are clear. Visualized orbits appear symmetric bilaterally. Other: Visualized mastoid air cells are  clear. There is debris in each external auditory canal. IMPRESSION: 1. Atrophy with periventricular small vessel disease, stable. No acute infarct. No mass or hemorrhage. No extra-axial fluid. 2.  Right parietal scalp hematoma.  No fracture evident. 3.  Foci of arterial vascular calcification noted. 4.  Mucosal thickening in several ethmoid air cells. 5.  Probable cerumen in each external auditory canal. Electronically Signed   By: Lowella Grip III M.D.   On: 08/18/2019 13:40    ____________________________________________   PROCEDURES  Procedure(s) performed (including Critical Care):  Marland KitchenMarland KitchenLaceration Repair  Date/Time: 08/18/2019 2:44 PM Performed by: Sable Feil, PA-C Authorized by: Sable Feil, PA-C   Consent:    Consent obtained:  Verbal   Consent given by:  Spouse   Risks discussed:  Infection, pain, need for additional repair and poor cosmetic result Anesthesia (see MAR for exact dosages):    Anesthesia method:  None Laceration details:    Location:  Scalp   Scalp location:  R parietal   Length (cm):  0.5   Depth (mm):  2 Repair type:    Repair type:  Simple Pre-procedure details:    Preparation:  Patient was prepped and draped in usual sterile fashion and imaging obtained to evaluate for foreign bodies Exploration:    Contaminated: no   Treatment:    Area cleansed with:  Saline   Amount of cleaning:  Standard Skin repair:    Repair method:  Staples   Number of staples:  7 Approximation:    Approximation:  Close Post-procedure details:    Dressing:  Sterile dressing   Patient tolerance of procedure:  Tolerated well, no immediate complications     ____________________________________________   INITIAL IMPRESSION / ASSESSMENT AND PLAN / ED COURSE  As part of my medical decision making, I reviewed the following data within the [place]     Patient presents with scalp laceration secondary to a trip and fall.  No LOC.  Discussed CT  findings with patient's wife.  See procedure note for wound closure.  Patient's wife given discharge care instruction advised return back in 10 days for staple removal.          ____________________________________________   FINAL CLINICAL IMPRESSION(S) / ED DIAGNOSES  Final diagnoses:  Laceration of scalp, initial encounter     ED Discharge Orders    None       Note:  This document was prepared using Dragon voice recognition software and may include unintentional dictation errors.    Sable Feil, PA-C 08/18/19 1528    Merlyn Lot, MD 08/18/19 [phone]

## 2019-08-18 NOTE — Discharge Instructions (Signed)
Follow discharge care instruction have staples removed in 10 days.

## 2019-08-18 NOTE — ED Notes (Signed)
Wife is now at bedside.

## 2019-08-28 DIAGNOSIS — Z4802 Encounter for removal of sutures: Secondary | ICD-10-CM | POA: Diagnosis not present

## 2019-09-02 ENCOUNTER — Other Ambulatory Visit: Payer: Self-pay

## 2019-09-02 DIAGNOSIS — Z7982 Long term (current) use of aspirin: Secondary | ICD-10-CM | POA: Diagnosis not present

## 2019-09-02 DIAGNOSIS — S51812A Laceration without foreign body of left forearm, initial encounter: Secondary | ICD-10-CM | POA: Diagnosis not present

## 2019-09-02 DIAGNOSIS — Y939 Activity, unspecified: Secondary | ICD-10-CM | POA: Insufficient documentation

## 2019-09-02 DIAGNOSIS — W01198A Fall on same level from slipping, tripping and stumbling with subsequent striking against other object, initial encounter: Secondary | ICD-10-CM | POA: Diagnosis not present

## 2019-09-02 DIAGNOSIS — G309 Alzheimer's disease, unspecified: Secondary | ICD-10-CM | POA: Insufficient documentation

## 2019-09-02 DIAGNOSIS — Y999 Unspecified external cause status: Secondary | ICD-10-CM | POA: Insufficient documentation

## 2019-09-02 DIAGNOSIS — Z79899 Other long term (current) drug therapy: Secondary | ICD-10-CM | POA: Diagnosis not present

## 2019-09-02 DIAGNOSIS — N3000 Acute cystitis without hematuria: Secondary | ICD-10-CM | POA: Diagnosis not present

## 2019-09-02 DIAGNOSIS — S41112A Laceration without foreign body of left upper arm, initial encounter: Secondary | ICD-10-CM | POA: Diagnosis not present

## 2019-09-02 DIAGNOSIS — Y929 Unspecified place or not applicable: Secondary | ICD-10-CM | POA: Insufficient documentation

## 2019-09-02 DIAGNOSIS — R509 Fever, unspecified: Secondary | ICD-10-CM | POA: Diagnosis not present

## 2019-09-02 DIAGNOSIS — J9811 Atelectasis: Secondary | ICD-10-CM | POA: Diagnosis not present

## 2019-09-02 DIAGNOSIS — Z87891 Personal history of nicotine dependence: Secondary | ICD-10-CM | POA: Insufficient documentation

## 2019-09-02 DIAGNOSIS — I2510 Atherosclerotic heart disease of native coronary artery without angina pectoris: Secondary | ICD-10-CM | POA: Diagnosis not present

## 2019-09-02 DIAGNOSIS — I517 Cardiomegaly: Secondary | ICD-10-CM | POA: Diagnosis not present

## 2019-09-02 MED ORDER — ACETAMINOPHEN 325 MG PO TABS
650.0000 mg | ORAL_TABLET | Freq: Once | ORAL | Status: AC | PRN
  Administered 2019-09-03: 650 mg via ORAL
  Filled 2019-09-02: qty 2

## 2019-09-02 NOTE — ED Triage Notes (Signed)
Pt to the er for a large lac to the left arm r/t a fall tonight. Pt is prone to falls but wife states pt slipped on pajama pants and slid down the wall. Wife thinks pt hit arm on metal part of door. Pt has a V shaped lac with bleeding to the left posterior medial arm. Pt also has a fever. Wife denies any recent illness. States pt may have coughed once or twice but nothing else. Denies N/V/D. Pt is very demented.

## 2019-09-03 ENCOUNTER — Emergency Department
Admission: EM | Admit: 2019-09-03 | Discharge: 2019-09-03 | Disposition: A | Discharge status: Home / Self Care | Payer: PPO | Attending: Emergency Medicine | Admitting: Emergency Medicine

## 2019-09-03 ENCOUNTER — Emergency Department: Payer: PPO

## 2019-09-03 ENCOUNTER — Other Ambulatory Visit: Payer: Self-pay

## 2019-09-03 DIAGNOSIS — R509 Fever, unspecified: Secondary | ICD-10-CM | POA: Diagnosis not present

## 2019-09-03 DIAGNOSIS — N3000 Acute cystitis without hematuria: Secondary | ICD-10-CM

## 2019-09-03 DIAGNOSIS — I517 Cardiomegaly: Secondary | ICD-10-CM | POA: Diagnosis not present

## 2019-09-03 DIAGNOSIS — J9811 Atelectasis: Secondary | ICD-10-CM | POA: Diagnosis not present

## 2019-09-03 DIAGNOSIS — S51812A Laceration without foreign body of left forearm, initial encounter: Secondary | ICD-10-CM | POA: Diagnosis not present

## 2019-09-03 DIAGNOSIS — S41112A Laceration without foreign body of left upper arm, initial encounter: Secondary | ICD-10-CM

## 2019-09-03 LAB — COMPREHENSIVE METABOLIC PANEL
ALT: 17 U/L (ref 0–44)
AST: 19 U/L (ref 15–41)
Albumin: 3.5 g/dL (ref 3.5–5.0)
Alkaline Phosphatase: 122 U/L (ref 38–126)
Anion gap: 11 (ref 5–15)
BUN: 12 mg/dL (ref 8–23)
CO2: 27 mmol/L (ref 22–32)
Calcium: 9 mg/dL (ref 8.9–10.3)
Chloride: 101 mmol/L (ref 98–111)
Creatinine, Ser: 1.05 mg/dL (ref 0.61–1.24)
GFR calc Af Amer: >60 mL/min (ref >60)
GFR calc non Af Amer: >60 mL/min (ref >60)
Glucose, Bld: 176 mg/dL — ABNORMAL HIGH (ref 70–99)
Potassium: 4 mmol/L (ref 3.5–5.1)
Sodium: 139 mmol/L (ref 135–145)
Total Bilirubin: 0.6 mg/dL (ref 0.3–1.2)
Total Protein: 7.4 g/dL (ref 6.5–8.1)

## 2019-09-03 LAB — CBC WITH DIFFERENTIAL/PLATELET
Abs Immature Granulocytes: 0.09 10*3/uL — ABNORMAL HIGH (ref 0.00–0.07)
Basophils Absolute: 0.1 10*3/uL (ref 0.0–0.1)
Basophils Relative: 1 %
Eosinophils Absolute: 1.5 10*3/uL — ABNORMAL HIGH (ref 0.0–0.5)
Eosinophils Relative: 11 %
HCT: 33.7 % — ABNORMAL LOW (ref 39.0–52.0)
Hemoglobin: 11.1 g/dL — ABNORMAL LOW (ref 13.0–17.0)
Immature Granulocytes: 1 %
Lymphocytes Relative: 16 %
Lymphs Abs: 2.1 10*3/uL (ref 0.7–4.0)
MCH: 28.8 pg (ref 26.0–34.0)
MCHC: 32.9 g/dL (ref 30.0–36.0)
MCV: 87.5 fL (ref 80.0–100.0)
Monocytes Absolute: 1.1 10*3/uL — ABNORMAL HIGH (ref 0.1–1.0)
Monocytes Relative: 8 %
Neutro Abs: 8.4 10*3/uL — ABNORMAL HIGH (ref 1.7–7.7)
Neutrophils Relative %: 63 %
Platelets: 279 10*3/uL (ref 150–400)
RBC: 3.85 MIL/uL — ABNORMAL LOW (ref 4.22–5.81)
RDW: 13.2 % (ref 11.5–15.5)
WBC: 13.2 10*3/uL — ABNORMAL HIGH (ref 4.0–10.5)
nRBC: 0 % (ref 0.0–0.2)

## 2019-09-03 LAB — URINALYSIS, COMPLETE (UACMP) WITH MICROSCOPIC
Bilirubin Urine: NEGATIVE
Glucose, UA: NEGATIVE mg/dL
Ketones, ur: NEGATIVE mg/dL
Nitrite: NEGATIVE
Protein, ur: NEGATIVE mg/dL
Specific Gravity, Urine: 1.019 (ref 1.005–1.030)
Squamous Epithelial / HPF: NONE SEEN (ref 0–5)
pH: 5 (ref 5.0–8.0)

## 2019-09-03 MED ORDER — CEPHALEXIN 500 MG PO CAPS
500.0000 mg | ORAL_CAPSULE | Freq: Once | ORAL | Status: AC
  Administered 2019-09-03: 500 mg via ORAL
  Filled 2019-09-03: qty 1

## 2019-09-03 MED ORDER — CEPHALEXIN 500 MG PO CAPS: 500.0000 mg | ORAL_CAPSULE | Freq: Two times a day (BID) | ORAL | 0 refills | Status: AC

## 2019-09-03 MED ORDER — LIDOCAINE HCL (PF) 1 % IJ SOLN
15.0000 mL | Freq: Once | INTRAMUSCULAR | Status: AC
  Administered 2019-09-03: 15 mL via INTRADERMAL
  Filled 2019-09-03: qty 15

## 2019-09-03 NOTE — ED Notes (Signed)
Pt keeps removing bandages. Family with pt.

## 2019-09-03 NOTE — ED Provider Notes (Signed)
Patient resting comfortably in no distress.  Urinalysis concerning for probable UTI.  Patient to be placed on cephalexin for treatment of possible UTI as well as prophylaxis regarding his laceration.  Return precautions and treatment recommendations and follow-up discussed with the patient's family and patient who are agreeable with the plan.  Vitals:   09/03/19 0533 09/03/19 0600  BP: (!) 145/80 (!) 158/80  Pulse: 63 63  Resp: 18   Temp: 98.4 F (36.9 C)   SpO2: 97% 100%        Sharyn Creamer, MD 09/03/19 [phone]

## 2019-09-03 NOTE — ED Notes (Signed)
Pt's arm dressed and wrapped.  In and out cath performed by this RN and Erie Noe, Charity fundraiser.  MD notified.  Pt resting comfortably with wife at bedside.

## 2019-09-03 NOTE — ED Notes (Signed)
Pt daughter in white [place] escape sitting in parking lot. Her phone is dead so will need someone to locate her car in the lot to let her know to pull around when pt is ready for discharge.

## 2019-09-03 NOTE — ED Notes (Signed)
Lab called and notified to add urine culture.

## 2019-09-03 NOTE — ED Provider Notes (Signed)
[facility] Emergency Department Provider Note  ____________________________________________   First MD Initiated Contact with Patient 09/03/19 [phone]     (approximate)  I have reviewed the triage vital signs and the nursing notes.  History obtained from the patient's wife secondary to Alzheimer's dementia HISTORY  Chief Complaint Fall   HPI Jeff Key is a 74 y.o. male with below list of previous medical conditions presents to the emergency department following accidental slip and fall with resultant left forearm laceration.  Patient's wife states that he hit his arm against a metal part of a door resulting in a V-shaped laceration on his left posterior forearm.  Patient's wife states that he did not hit his head and had no loss of consciousness.  Patient was noted to be febrile on presentation with temperature 100.6.  Patient's wife states that the patient's had no cough        Past Medical History:  Diagnosis Date  . Anemia ~ 11/2015  . CAD (coronary artery disease)    a. inf STEMI on 02/10/16. RCA treated with DES followed by staged PCI of LAD and Circumflex 02/14/16.  Marland Kitchen Dementia ([place]) dx'd 01/2016  . GERD (gastroesophageal reflux disease)   . History of stomach ulcers 1980s?  . Ischemic cardiomyopathy    a. 01/2016: EF 40-45%.     Patient Active Problem List   Diagnosis Date Noted  . Hyperlipidemia 06/15/2017  . Alzheimer's dementia without behavioral disturbance ([place]) 12/11/2016  . Sepsis ([place]) 03/10/2016  . Pseudoaneurysm of right femoral artery ([place]) 02/23/2016  . NSVT (nonsustained ventricular tachycardia) ([place]) 02/15/2016  . CAD (coronary artery disease)   . Ischemic cardiomyopathy   . STEMI involving oth coronary artery of inferior wall ([place]) 02/10/2016  . Abnormal urinalysis 10/16/2014    Past Surgical History:  Procedure Laterality Date  . CARDIAC CATHETERIZATION N/A 02/10/2016   Procedure: Left Heart Cath and Coronary  Angiography;  Surgeon: Lorretta Harp, MD;  Location: [place] [facility];  Service: Cardiovascular;  Laterality: N/A;  . CARDIAC CATHETERIZATION N/A 02/10/2016   Procedure: Coronary Stent Intervention;  Surgeon: Lorretta Harp, MD;  Location: [place] [facility];  Service: Cardiovascular;  Laterality: N/A;  . CARDIAC CATHETERIZATION N/A 02/14/2016   Procedure: Coronary Stent Intervention-LAD and CFX;  Surgeon: Lorretta Harp, MD;  Location: [place] [facility];  Service: Cardiovascular;  Laterality: N/A;  . COLONOSCOPY WITH PROPOFOL N/A 05/06/2015   Procedure: COLONOSCOPY WITH PROPOFOL;  Surgeon: Hulen Luster, MD;  Location: [facility] ENDOSCOPY;  Service: Gastroenterology;  Laterality: N/A;  . CORONARY ANGIOPLASTY WITH STENT PLACEMENT  02/14/2016   "2 stents"  . ESOPHAGOGASTRODUODENOSCOPY (EGD) WITH PROPOFOL N/A 05/06/2015   Procedure: ESOPHAGOGASTRODUODENOSCOPY (EGD) WITH PROPOFOL;  Surgeon: Hulen Luster, MD;  Location: [facility] ENDOSCOPY;  Service: Gastroenterology;  Laterality: N/A;  . FOREARM FRACTURE SURGERY Right 1985   "got it tore up in [place]"  . FRACTURE SURGERY      Prior to Admission medications   Medication Sig Start Date End Date Taking? Authorizing Provider  aspirin EC 81 MG tablet Take 81 mg by mouth daily.    [provider]  atorvastatin (LIPITOR) 20 MG tablet Take 1 tablet (20 mg total) by mouth daily. 04/22/19   Lorretta Harp, MD  BRILINTA 90 MG TABS tablet TAKE ONE TABLET BY MOUTH TWICE DAILY 03/14/17   Lorretta Harp, MD  cephALEXin (KEFLEX) 500 MG capsule Take 1 capsule (500 mg total) by mouth 2 (two) times  daily for 10 days. 09/03/19 09/13/19  Darci Current, MD  clopidogrel (PLAVIX) 75 MG tablet Take 1 tablet (75 mg total) by mouth daily. 06/15/17   Runell Gess, MD  ferrous sulfate 325 (65 FE) MG tablet Take 325 mg by mouth daily with breakfast.    [provider]  galantamine (RAZADYNE) 4 MG tablet Take 4 mg by mouth 2 (two) times daily with a meal.   01/31/16 04/22/19  [provider]  lisinopril (PRINIVIL,ZESTRIL) 10 MG tablet Take 1 tablet by mouth once daily 06/10/18   Runell Gess, MD  memantine (NAMENDA) 5 MG tablet Take 1 tablet by mouth 2 (two) times daily. 05/07/17 04/22/19  [provider]  metoprolol tartrate (LOPRESSOR) 25 MG tablet Take 1 tablet (25 mg total) by mouth 2 (two) times daily. 06/10/19   Runell Gess, MD  nitroGLYCERIN (NITROSTAT) 0.4 MG SL tablet Place 1 tablet (0.4 mg total) under the tongue every 5 (five) minutes as needed for chest pain. 02/15/16   Janetta Hora, PA-C  omeprazole (PRILOSEC) 20 MG capsule Take 20 mg by mouth daily.    [provider]  vitamin B-12 (CYANOCOBALAMIN) 1000 MCG tablet Take 1,000 mcg by mouth daily.    [provider]    Allergies Patient has no known allergies.  Family History  Problem Relation Age of Onset  . Prostate cancer Father   . Bladder Cancer Neg Hx   . Kidney cancer Neg Hx     Social History Social History   Tobacco Use  . Smoking status: Former Smoker    Packs/day: 2.00    Years: 30.00    Pack years: 60.00    Types: Cigarettes    Quit date: 1990    Years since quitting: 31.4  . Smokeless tobacco: Never Used  Substance Use Topics  . Alcohol use: No    Alcohol/week: 0.0 standard drinks  . Drug use: No    Review of Systems Constitutional: No fever/chills Eyes: No visual changes. ENT: No sore throat. Cardiovascular: Denies chest pain. Respiratory: Denies shortness of breath. Gastrointestinal: No abdominal pain.  No nausea, no vomiting.  No diarrhea.  No constipation. Genitourinary: Negative for dysuria. Musculoskeletal: Negative for neck pain.  Negative for back pain. Integumentary: Positive for left forearm laceration Neurological: Negative for headaches, focal weakness or numbness.  ____________________________________________   PHYSICAL EXAM:  VITAL SIGNS: ED Triage Vitals  Enc Vitals Group      BP 09/02/19 2347 124/60     Pulse Rate 09/02/19 2347 67     Resp 09/02/19 2347 18     Temp 09/02/19 2347 (!) 100.6 F (38.1 C)     Temp Source 09/02/19 2347 Oral     SpO2 09/02/19 2347 95 %     Weight 09/02/19 2348 108.9 kg (240 lb)     Height 09/02/19 2348 1.803 m (5\' 11" )     Head Circumference --      Peak Flow --      Pain Score --      Pain Loc --      Pain Edu? --      Excl. in GC? --     Constitutional: Alert and oriented.  Eyes: Conjunctivae are normal.  Head: Atraumatic. Mouth/Throat: Patient is wearing a mask. Neck: No stridor.  No meningeal signs.   Cardiovascular: Normal rate, regular rhythm. Good peripheral circulation. Grossly normal heart sounds. Respiratory: Normal respiratory effort.  No retractions. Gastrointestinal: Soft and nontender. No distention.  Musculoskeletal: No lower extremity tenderness nor edema. No gross deformities of extremities. Neurologic:  Normal speech and language. No gross focal neurologic deficits are appreciated.  Skin: 12 cm V-shaped laceration posterior aspect of the left forearm Psychiatric: Mood and affect are normal. Speech and behavior are normal.  ____________________________________________   LABS (all labs ordered are listed, but only abnormal results are displayed)  Labs Reviewed  CBC WITH DIFFERENTIAL/PLATELET - Abnormal; Notable for the following components:      Result Value   WBC 13.2 (*)    RBC 3.85 (*)    Hemoglobin 11.1 (*)    HCT 33.7 (*)    Neutro Abs 8.4 (*)    Monocytes Absolute 1.1 (*)    Eosinophils Absolute 1.5 (*)    Abs Immature Granulocytes 0.09 (*)    All other components within normal limits  COMPREHENSIVE METABOLIC PANEL - Abnormal; Notable for the following components:   Glucose, Bld 176 (*)    All other components within normal limits  URINALYSIS, COMPLETE (UACMP) WITH MICROSCOPIC - Abnormal; Notable for the following components:   Color, Urine YELLOW (*)    APPearance HAZY (*)    Hgb urine  dipstick SMALL (*)    Leukocytes,Ua MODERATE (*)    Bacteria, UA RARE (*)    All other components within normal limits  URINE CULTURE     RADIOLOGY I, [place] Jamita Mckelvin, personally viewed and evaluated these images (plain radiographs) as part of my medical decision making, as well as reviewing the written report by the radiologist.  ED MD interpretation: Subsegmental atelectasis right lung base on chest x-ray per radiologist.  Official radiology report(s): DG Chest 2 View  Result Date: 09/03/2019 CLINICAL DATA:  Fever fall EXAM: CHEST - 2 VIEW COMPARISON:  February 27, 2018 FINDINGS: There is stable cardiomegaly. Hazy subsegmental atelectasis at the right lung base. The visualized skeletal structures are unremarkable. IMPRESSION: Subsegmental atelectasis at the right lung base. Stable cardiomegaly Electronically Signed   By: Jonna Clark M.D.   On: 09/03/2019 00:28    ____________________________________________   PROCEDURES     .Marland KitchenLaceration Repair  Date/Time: 09/05/2019 4:22 AM Performed by: Darci Current, MD Authorized by: Darci Current, MD   Consent:    Consent obtained:  Verbal   Consent given by:  Patient   Risks discussed:  Infection, pain, retained foreign body, poor cosmetic result and poor wound healing Anesthesia (see MAR for exact dosages):    Anesthesia method:  Local infiltration   Local anesthetic:  Lidocaine 1% w/o epi Laceration details:    Location:  Shoulder/arm   Shoulder/arm location:  L lower arm   Length (cm):  12 Repair type:    Repair type:  Simple Exploration:    Hemostasis achieved with:  Direct pressure   Wound exploration: entire depth of wound probed and visualized     Contaminated: no   Treatment:    Area cleansed with:  Saline and Betadine   Amount of cleaning:  Extensive   Irrigation solution:  Sterile saline   Irrigation volume:  15 ml   Visualized foreign bodies/material removed: no   Skin repair:    Repair method:   Sutures   Suture size:  4-0   Suture material:  Nylon   Suture technique:  Simple interrupted   Number of sutures:  13 Approximation:    Approximation:  Close Post-procedure details:    Dressing:  Sterile dressing   Patient tolerance of procedure:  Tolerated well, no immediate  complications     ____________________________________________   INITIAL IMPRESSION / MDM / ASSESSMENT AND PLAN / ED COURSE  As part of my medical decision making, I reviewed the following data within the electronic MEDICAL RECORD NUMBER  74 year old male presented with above-stated history and physical exam secondary to accidental fall resultant left forearm laceration.  Patient noted to be febrile on arrival raising possibility of infectious etiology.  Consider possibility of urinary tract infections versus no pneumonia.  Patient's laceration was repaired without any difficulty.  Urinalysis concerning for possible urinary tract infection.  Patient was given Keflex in the emergency department will be prescribed the same at home.  Urine culture pending  ____________________________________________  FINAL CLINICAL IMPRESSION(S) / ED DIAGNOSES  Final diagnoses:  Arm laceration, left, initial encounter  Acute cystitis without hematuria     MEDICATIONS GIVEN DURING THIS VISIT:  Medications  cephALEXin (KEFLEX) capsule 500 mg (has no administration in time range)  acetaminophen (TYLENOL) tablet 650 mg (650 mg Oral Given 09/03/19 0013)  lidocaine (PF) (XYLOCAINE) 1 % injection 15 mL (15 mLs Intradermal Given by Other 09/03/19 6433)     ED Discharge Orders         Ordered    cephALEXin (KEFLEX) 500 MG capsule  2 times daily     Discontinue  Reprint     09/03/19 0725          *Please note:  Lillia Dallas was evaluated in Emergency Department on 09/03/2019 for the symptoms described in the history of present illness. He was evaluated in the context of the global COVID-19 pandemic, which necessitated  consideration that the patient might be at risk for infection with the SARS-CoV-2 virus that causes COVID-19. Institutional protocols and algorithms that pertain to the evaluation of patients at risk for COVID-19 are in a state of rapid change based on information released by regulatory bodies including the CDC and federal and state organizations. These policies and algorithms were followed during the patient's care in the ED.  Some ED evaluations and interventions may be delayed as a result of limited staffing during the pandemic.*  Note:  This document was prepared using Dragon voice recognition software and may include unintentional dictation errors.   Darci Current, MD 09/05/19 [phone]

## 2019-09-05 LAB — URINE CULTURE: Culture: >=100000 — AB

## 2019-09-06 NOTE — Consult Note (Signed)
Patient growing >=100,000 COLONIES/mL ENTEROCOCCUS FAECALIS in urine culture. Pt was sent home on keflex, which will not cover the bacteria. Discussed case with Dr. Larinda Buttery. Agreed to call in prescription for Amoxicillin 500 mg TID x 7 days. Called in script to [facility] on [address]. Informed pt's spouse for pt to stop taking the keflex.   Thanks, Paschal Dopp, PharmD, BCPS

## 2019-09-09 DIAGNOSIS — G309 Alzheimer's disease, unspecified: Secondary | ICD-10-CM | POA: Diagnosis not present

## 2019-09-09 DIAGNOSIS — F0150 Vascular dementia without behavioral disturbance: Secondary | ICD-10-CM | POA: Diagnosis not present

## 2019-09-09 DIAGNOSIS — F0280 Dementia in other diseases classified elsewhere without behavioral disturbance: Secondary | ICD-10-CM | POA: Diagnosis not present

## 2019-09-09 DIAGNOSIS — Z6833 Body mass index (BMI) 33.0-33.9, adult: Secondary | ICD-10-CM | POA: Diagnosis not present

## 2019-09-09 DIAGNOSIS — Z23 Encounter for immunization: Secondary | ICD-10-CM | POA: Diagnosis not present

## 2019-09-09 DIAGNOSIS — I2510 Atherosclerotic heart disease of native coronary artery without angina pectoris: Secondary | ICD-10-CM | POA: Diagnosis not present

## 2019-09-09 DIAGNOSIS — Z5189 Encounter for other specified aftercare: Secondary | ICD-10-CM | POA: Diagnosis not present

## 2019-09-12 DIAGNOSIS — F0150 Vascular dementia without behavioral disturbance: Secondary | ICD-10-CM | POA: Diagnosis not present

## 2019-09-12 DIAGNOSIS — Z4802 Encounter for removal of sutures: Secondary | ICD-10-CM | POA: Diagnosis not present

## 2019-09-12 DIAGNOSIS — Z87440 Personal history of urinary (tract) infections: Secondary | ICD-10-CM | POA: Diagnosis not present

## 2019-09-12 DIAGNOSIS — I2510 Atherosclerotic heart disease of native coronary artery without angina pectoris: Secondary | ICD-10-CM | POA: Diagnosis not present

## 2019-09-12 DIAGNOSIS — G309 Alzheimer's disease, unspecified: Secondary | ICD-10-CM | POA: Diagnosis not present

## 2019-09-12 DIAGNOSIS — F0280 Dementia in other diseases classified elsewhere without behavioral disturbance: Secondary | ICD-10-CM | POA: Diagnosis not present

## 2019-10-13 ENCOUNTER — Inpatient Hospital Stay
Admission: EM | Admit: 2019-10-13 | Discharge: 2019-10-17 | DRG: 871 | Disposition: A | Discharge status: Home Health | Payer: PPO | Attending: Internal Medicine | Admitting: Internal Medicine

## 2019-10-13 ENCOUNTER — Emergency Department: Payer: PPO

## 2019-10-13 DIAGNOSIS — I252 Old myocardial infarction: Secondary | ICD-10-CM | POA: Diagnosis not present

## 2019-10-13 DIAGNOSIS — R6520 Severe sepsis without septic shock: Secondary | ICD-10-CM | POA: Diagnosis not present

## 2019-10-13 DIAGNOSIS — I6529 Occlusion and stenosis of unspecified carotid artery: Secondary | ICD-10-CM | POA: Diagnosis not present

## 2019-10-13 DIAGNOSIS — Z7189 Other specified counseling: Secondary | ICD-10-CM

## 2019-10-13 DIAGNOSIS — R4182 Altered mental status, unspecified: Secondary | ICD-10-CM | POA: Diagnosis not present

## 2019-10-13 DIAGNOSIS — K219 Gastro-esophageal reflux disease without esophagitis: Secondary | ICD-10-CM | POA: Diagnosis not present

## 2019-10-13 DIAGNOSIS — F0280 Dementia in other diseases classified elsewhere without behavioral disturbance: Secondary | ICD-10-CM | POA: Diagnosis present

## 2019-10-13 DIAGNOSIS — Z7902 Long term (current) use of antithrombotics/antiplatelets: Secondary | ICD-10-CM | POA: Diagnosis not present

## 2019-10-13 DIAGNOSIS — J181 Lobar pneumonia, unspecified organism: Secondary | ICD-10-CM

## 2019-10-13 DIAGNOSIS — R509 Fever, unspecified: Secondary | ICD-10-CM

## 2019-10-13 DIAGNOSIS — Z515 Encounter for palliative care: Secondary | ICD-10-CM

## 2019-10-13 DIAGNOSIS — Z79899 Other long term (current) drug therapy: Secondary | ICD-10-CM | POA: Diagnosis not present

## 2019-10-13 DIAGNOSIS — I2510 Atherosclerotic heart disease of native coronary artery without angina pectoris: Secondary | ICD-10-CM | POA: Diagnosis not present

## 2019-10-13 DIAGNOSIS — J9811 Atelectasis: Secondary | ICD-10-CM | POA: Diagnosis not present

## 2019-10-13 DIAGNOSIS — G9341 Metabolic encephalopathy: Secondary | ICD-10-CM

## 2019-10-13 DIAGNOSIS — G300 Alzheimer's disease with early onset: Secondary | ICD-10-CM | POA: Diagnosis not present

## 2019-10-13 DIAGNOSIS — I5042 Chronic combined systolic (congestive) and diastolic (congestive) heart failure: Secondary | ICD-10-CM | POA: Diagnosis not present

## 2019-10-13 DIAGNOSIS — A419 Sepsis, unspecified organism: Secondary | ICD-10-CM | POA: Diagnosis present

## 2019-10-13 DIAGNOSIS — R059 Cough, unspecified: Secondary | ICD-10-CM

## 2019-10-13 DIAGNOSIS — R0689 Other abnormalities of breathing: Secondary | ICD-10-CM | POA: Diagnosis not present

## 2019-10-13 DIAGNOSIS — I255 Ischemic cardiomyopathy: Secondary | ICD-10-CM | POA: Diagnosis not present

## 2019-10-13 DIAGNOSIS — Z955 Presence of coronary angioplasty implant and graft: Secondary | ICD-10-CM

## 2019-10-13 DIAGNOSIS — Z20822 Contact with and (suspected) exposure to covid-19: Secondary | ICD-10-CM | POA: Diagnosis present

## 2019-10-13 DIAGNOSIS — J9601 Acute respiratory failure with hypoxia: Secondary | ICD-10-CM

## 2019-10-13 DIAGNOSIS — K5190 Ulcerative colitis, unspecified, without complications: Secondary | ICD-10-CM | POA: Diagnosis not present

## 2019-10-13 DIAGNOSIS — I517 Cardiomegaly: Secondary | ICD-10-CM | POA: Diagnosis not present

## 2019-10-13 DIAGNOSIS — R0902 Hypoxemia: Secondary | ICD-10-CM

## 2019-10-13 DIAGNOSIS — Z8042 Family history of malignant neoplasm of prostate: Secondary | ICD-10-CM | POA: Diagnosis not present

## 2019-10-13 DIAGNOSIS — I5023 Acute on chronic systolic (congestive) heart failure: Secondary | ICD-10-CM

## 2019-10-13 DIAGNOSIS — Z7982 Long term (current) use of aspirin: Secondary | ICD-10-CM

## 2019-10-13 DIAGNOSIS — I7090 Unspecified atherosclerosis: Secondary | ICD-10-CM | POA: Diagnosis not present

## 2019-10-13 DIAGNOSIS — J189 Pneumonia, unspecified organism: Secondary | ICD-10-CM | POA: Diagnosis not present

## 2019-10-13 DIAGNOSIS — G309 Alzheimer's disease, unspecified: Secondary | ICD-10-CM | POA: Diagnosis not present

## 2019-10-13 DIAGNOSIS — R069 Unspecified abnormalities of breathing: Secondary | ICD-10-CM | POA: Diagnosis not present

## 2019-10-13 DIAGNOSIS — R410 Disorientation, unspecified: Secondary | ICD-10-CM | POA: Diagnosis not present

## 2019-10-13 DIAGNOSIS — I10 Essential (primary) hypertension: Secondary | ICD-10-CM | POA: Diagnosis not present

## 2019-10-13 DIAGNOSIS — R404 Transient alteration of awareness: Secondary | ICD-10-CM | POA: Diagnosis not present

## 2019-10-13 DIAGNOSIS — Z87891 Personal history of nicotine dependence: Secondary | ICD-10-CM

## 2019-10-13 DIAGNOSIS — G319 Degenerative disease of nervous system, unspecified: Secondary | ICD-10-CM | POA: Diagnosis not present

## 2019-10-13 LAB — CBC WITH DIFFERENTIAL/PLATELET
Abs Immature Granulocytes: 0.09 10*3/uL — ABNORMAL HIGH (ref 0.00–0.07)
Basophils Absolute: 0.1 10*3/uL (ref 0.0–0.1)
Basophils Relative: 0 %
Eosinophils Absolute: 0.2 10*3/uL (ref 0.0–0.5)
Eosinophils Relative: 1 %
HCT: 35.5 % — ABNORMAL LOW (ref 39.0–52.0)
Hemoglobin: 10.9 g/dL — ABNORMAL LOW (ref 13.0–17.0)
Immature Granulocytes: 1 %
Lymphocytes Relative: 10 %
Lymphs Abs: 1.7 10*3/uL (ref 0.7–4.0)
MCH: 28.8 pg (ref 26.0–34.0)
MCHC: 30.7 g/dL (ref 30.0–36.0)
MCV: 93.7 fL (ref 80.0–100.0)
Monocytes Absolute: 1.1 10*3/uL — ABNORMAL HIGH (ref 0.1–1.0)
Monocytes Relative: 6 %
Neutro Abs: 14.7 10*3/uL — ABNORMAL HIGH (ref 1.7–7.7)
Neutrophils Relative %: 82 %
Platelets: 252 10*3/uL (ref 150–400)
RBC: 3.79 MIL/uL — ABNORMAL LOW (ref 4.22–5.81)
RDW: 12.9 % (ref 11.5–15.5)
WBC: 17.8 10*3/uL — ABNORMAL HIGH (ref 4.0–10.5)
nRBC: 0 % (ref 0.0–0.2)

## 2019-10-13 LAB — LACTIC ACID, PLASMA: Lactic Acid, Venous: 2.5 mmol/L (ref 0.5–1.9)

## 2019-10-13 MED ORDER — PIPERACILLIN-TAZOBACTAM 3.375 G IVPB 30 MIN
3.3750 g | Freq: Once | INTRAVENOUS | Status: AC
  Administered 2019-10-13: 3.375 g via INTRAVENOUS
  Filled 2019-10-13: qty 50

## 2019-10-13 MED ORDER — VANCOMYCIN HCL IN DEXTROSE 1-5 GM/200ML-% IV SOLN
1000.0000 mg | Freq: Once | INTRAVENOUS | Status: AC
  Administered 2019-10-13: 1000 mg via INTRAVENOUS
  Filled 2019-10-13: qty 200

## 2019-10-13 MED ORDER — SODIUM CHLORIDE 0.9 % IV SOLN: Freq: Once | INTRAVENOUS | Status: AC

## 2019-10-13 MED ORDER — ONDANSETRON HCL 4 MG/2ML IJ SOLN
4.0000 mg | Freq: Once | INTRAMUSCULAR | Status: AC
  Administered 2019-10-13: 4 mg via INTRAVENOUS
  Filled 2019-10-13: qty 2

## 2019-10-13 NOTE — ED Notes (Signed)
Set of blood cultures drawn from L hand by this RN. 2nd RN attempting for IV placement at R arm.

## 2019-10-13 NOTE — ED Provider Notes (Signed)
ER Provider Note       Time seen: 9:49 PM    I have reviewed the vital signs and the nursing notes. Level V caveat: History/ROS limited by altered mental status HISTORY   Chief Complaint Altered Mental Status   HPI Jeff Key is a 74 y.o. male with a history of anemia, coronary artery disease, dementia, GERD, cardiomyopathy who presents today for garbled speech and altered mental status.  He does have a history of dementia, was reportedly hypoxic on room air when lying flat.  Temperature was 102.7.  No further information is available.  Past Medical History:  Diagnosis Date  . Anemia ~ 11/2015  . CAD (coronary artery disease)    a. inf STEMI on 02/10/16. RCA treated with DES followed by staged PCI of LAD and Circumflex 02/14/16.  Marland Kitchen Dementia ([facility]) dx'd 01/2016  . GERD (gastroesophageal reflux disease)   . History of stomach ulcers 1980s?  . Ischemic cardiomyopathy    a. 01/2016: EF 40-45%.     Past Surgical History:  Procedure Laterality Date  . CARDIAC CATHETERIZATION N/A 02/10/2016   Procedure: Left Heart Cath and Coronary Angiography;  Surgeon: Runell Gess, MD;  Location: [facility] INVASIVE CV LAB;  Service: Cardiovascular;  Laterality: N/A;  . CARDIAC CATHETERIZATION N/A 02/10/2016   Procedure: Coronary Stent Intervention;  Surgeon: Runell Gess, MD;  Location: [facility] INVASIVE CV LAB;  Service: Cardiovascular;  Laterality: N/A;  . CARDIAC CATHETERIZATION N/A 02/14/2016   Procedure: Coronary Stent Intervention-LAD and CFX;  Surgeon: Runell Gess, MD;  Location: [facility] INVASIVE CV LAB;  Service: Cardiovascular;  Laterality: N/A;  . COLONOSCOPY WITH PROPOFOL N/A 05/06/2015   Procedure: COLONOSCOPY WITH PROPOFOL;  Surgeon: Wallace Cullens, MD;  Location: [facility] ENDOSCOPY;  Service: Gastroenterology;  Laterality: N/A;  . CORONARY ANGIOPLASTY WITH STENT PLACEMENT  02/14/2016   "2 stents"  . ESOPHAGOGASTRODUODENOSCOPY (EGD) WITH PROPOFOL N/A 05/06/2015   Procedure:  ESOPHAGOGASTRODUODENOSCOPY (EGD) WITH PROPOFOL;  Surgeon: Wallace Cullens, MD;  Location: [facility] ENDOSCOPY;  Service: Gastroenterology;  Laterality: N/A;  . FOREARM FRACTURE SURGERY Right 1985   "got it tore up in [place]"  . FRACTURE SURGERY      Allergies Patient has no known allergies.  Review of Systems Unknown, reported altered mental status and hypoxia  All systems negative/normal/unremarkable except as stated in the HPI  ____________________________________________   PHYSICAL EXAM:  VITAL SIGNS: Vitals:   10/13/19 2144  Temp: (!) 102.7 F (39.3 C)    Constitutional: Disoriented, mild distress  Eyes: Conjunctivae are normal. Normal extraocular movements. Cardiovascular: Normal rate, regular rhythm. No murmurs, rubs, or gallops. Respiratory: Normal respiratory effort without tachypnea nor retractions.  Scattered rhonchi Gastrointestinal: Distended abdomen, normal bowel sounds Musculoskeletal: Nontender with normal range of motion in extremities. No lower extremity tenderness nor edema. Neurologic:  Normal speech and language. No gross focal neurologic deficits are appreciated.  Skin:  Skin is warm, dry and intact. No rash noted. Psychiatric: Speech and behavior are normal.  ____________________________________________  EKG: Interpreted by me.  Sinus rhythm with a rate of 95 bpm, PAC, normal axis, normal QT  ____________________________________________   LABS (pertinent positives/negatives)  Labs Reviewed  LACTIC ACID, PLASMA - Abnormal; Notable for the following components:      Result Value   Lactic Acid, Venous 2.5 (*)    All other components within normal limits  CBC WITH DIFFERENTIAL/PLATELET - Abnormal; Notable for the following components:   WBC 17.8 (*)    RBC 3.79 (*)  Hemoglobin 10.9 (*)    HCT 35.5 (*)    Neutro Abs 14.7 (*)    Monocytes Absolute 1.1 (*)    Abs Immature Granulocytes 0.09 (*)    All other components within normal limits  CULTURE,  BLOOD (ROUTINE X 2)  CULTURE, BLOOD (ROUTINE X 2)  URINE CULTURE  SARS CORONAVIRUS 2 BY RT PCR (HOSPITAL ORDER, PERFORMED IN [facility])  LACTIC ACID, PLASMA  URINALYSIS, ROUTINE W REFLEX MICROSCOPIC  COMPREHENSIVE METABOLIC PANEL  PROTIME-INR   CRITICAL CARE Performed by: Ulice Dash   Total critical care time: 30 minutes  Critical care time was exclusive of separately billable procedures and treating other patients.  Critical care was necessary to treat or prevent imminent or life-threatening deterioration.  Critical care was time spent personally by me on the following activities: development of treatment plan with patient and/or surrogate as well as nursing, discussions with consultants, evaluation of patient's response to treatment, examination of patient, obtaining history from patient or surrogate, ordering and performing treatments and interventions, ordering and review of laboratory studies, ordering and review of radiographic studies, pulse oximetry and re-evaluation of patient's condition.  RADIOLOGY  Images were viewed by me Chest x-ray, CT head IMPRESSION: No acute intracranial abnormality.  Findings consistent with age related atrophy and chronic small vessel ischemia IMPRESSION: Mild bibasilar atelectasis.   DIFFERENTIAL DIAGNOSIS  Sepsis, dehydration, electrolyte abnormality, UTI, pneumonia, COVID-19  ASSESSMENT AND PLAN  Altered mental status   Plan: The patient had presented for fever and altered mental status with hypoxia. Patient's labs revealed significant leukocytosis, suspect underlying urosepsis.  Patient was given fluids and broad-spectrum antibiotics.  Daryel November MD    Note: This note was generated in part or whole with voice recognition software. Voice recognition is usually quite accurate but there are transcription errors that can and very often do occur. I apologize for any typographical errors that were not  detected and corrected.     Emily Filbert, MD 10/13/19 2306

## 2019-10-13 NOTE — ED Notes (Addendum)
Family states pt vomited once yesterday. Pt leaving for CT. Will hang antibiotics and complete I&O cath once pt back.

## 2019-10-13 NOTE — ED Notes (Signed)
Pt had a brief 15-20 second seizure; gaze fixed to L side and tensing of muscles noted all over pt's body. EDP Williams notified and to bedside again. Pt then began vomiting post episode. Pt sat up as high as stretcher allowed during vomiting. EDP notified.

## 2019-10-13 NOTE — ED Notes (Signed)
Family to bedside. Updated.

## 2019-10-13 NOTE — ED Notes (Signed)
EDP Williams notified in person of lactic 2.5.

## 2019-10-13 NOTE — ED Triage Notes (Signed)
Pt in with AMS; "garbled speech"; history of dementia; 85% RA when pt laying flat with EMS; 92% RA when pt sat up with EMS; 95-96% on [place] but pt kept pulling [place] off per EMS. 135/76 BP; HR 102; ax temp 102.5; BG 145 with EMS.

## 2019-10-14 ENCOUNTER — Other Ambulatory Visit: Payer: Self-pay

## 2019-10-14 ENCOUNTER — Encounter: Payer: Self-pay | Admitting: Emergency Medicine

## 2019-10-14 DIAGNOSIS — Z955 Presence of coronary angioplasty implant and graft: Secondary | ICD-10-CM | POA: Diagnosis not present

## 2019-10-14 DIAGNOSIS — Z515 Encounter for palliative care: Secondary | ICD-10-CM | POA: Diagnosis not present

## 2019-10-14 DIAGNOSIS — G9341 Metabolic encephalopathy: Secondary | ICD-10-CM

## 2019-10-14 DIAGNOSIS — J189 Pneumonia, unspecified organism: Secondary | ICD-10-CM

## 2019-10-14 DIAGNOSIS — A419 Sepsis, unspecified organism: Secondary | ICD-10-CM | POA: Diagnosis present

## 2019-10-14 DIAGNOSIS — Z7902 Long term (current) use of antithrombotics/antiplatelets: Secondary | ICD-10-CM | POA: Diagnosis not present

## 2019-10-14 DIAGNOSIS — G300 Alzheimer's disease with early onset: Secondary | ICD-10-CM | POA: Diagnosis not present

## 2019-10-14 DIAGNOSIS — Z7189 Other specified counseling: Secondary | ICD-10-CM | POA: Diagnosis not present

## 2019-10-14 DIAGNOSIS — G309 Alzheimer's disease, unspecified: Secondary | ICD-10-CM | POA: Diagnosis present

## 2019-10-14 DIAGNOSIS — K5190 Ulcerative colitis, unspecified, without complications: Secondary | ICD-10-CM

## 2019-10-14 DIAGNOSIS — I2510 Atherosclerotic heart disease of native coronary artery without angina pectoris: Secondary | ICD-10-CM | POA: Diagnosis present

## 2019-10-14 DIAGNOSIS — I5042 Chronic combined systolic (congestive) and diastolic (congestive) heart failure: Secondary | ICD-10-CM | POA: Diagnosis present

## 2019-10-14 DIAGNOSIS — Z20822 Contact with and (suspected) exposure to covid-19: Secondary | ICD-10-CM | POA: Diagnosis present

## 2019-10-14 DIAGNOSIS — J9601 Acute respiratory failure with hypoxia: Secondary | ICD-10-CM

## 2019-10-14 DIAGNOSIS — Z8042 Family history of malignant neoplasm of prostate: Secondary | ICD-10-CM | POA: Diagnosis not present

## 2019-10-14 DIAGNOSIS — I252 Old myocardial infarction: Secondary | ICD-10-CM | POA: Diagnosis not present

## 2019-10-14 DIAGNOSIS — Z87891 Personal history of nicotine dependence: Secondary | ICD-10-CM | POA: Diagnosis not present

## 2019-10-14 DIAGNOSIS — J181 Lobar pneumonia, unspecified organism: Secondary | ICD-10-CM

## 2019-10-14 DIAGNOSIS — F0280 Dementia in other diseases classified elsewhere without behavioral disturbance: Secondary | ICD-10-CM | POA: Diagnosis present

## 2019-10-14 DIAGNOSIS — I5023 Acute on chronic systolic (congestive) heart failure: Secondary | ICD-10-CM | POA: Diagnosis not present

## 2019-10-14 DIAGNOSIS — I255 Ischemic cardiomyopathy: Secondary | ICD-10-CM | POA: Diagnosis present

## 2019-10-14 DIAGNOSIS — Z79899 Other long term (current) drug therapy: Secondary | ICD-10-CM | POA: Diagnosis not present

## 2019-10-14 DIAGNOSIS — K219 Gastro-esophageal reflux disease without esophagitis: Secondary | ICD-10-CM | POA: Diagnosis present

## 2019-10-14 DIAGNOSIS — R4182 Altered mental status, unspecified: Secondary | ICD-10-CM | POA: Diagnosis present

## 2019-10-14 DIAGNOSIS — Z7982 Long term (current) use of aspirin: Secondary | ICD-10-CM | POA: Diagnosis not present

## 2019-10-14 DIAGNOSIS — R6520 Severe sepsis without septic shock: Secondary | ICD-10-CM | POA: Diagnosis present

## 2019-10-14 LAB — CBC
HCT: 29.7 % — ABNORMAL LOW (ref 39.0–52.0)
Hemoglobin: 9.1 g/dL — ABNORMAL LOW (ref 13.0–17.0)
MCH: 28.6 pg (ref 26.0–34.0)
MCHC: 30.6 g/dL (ref 30.0–36.0)
MCV: 93.4 fL (ref 80.0–100.0)
Platelets: 213 10*3/uL (ref 150–400)
RBC: 3.18 MIL/uL — ABNORMAL LOW (ref 4.22–5.81)
RDW: 12.9 % (ref 11.5–15.5)
WBC: 19.1 10*3/uL — ABNORMAL HIGH (ref 4.0–10.5)
nRBC: 0 % (ref 0.0–0.2)

## 2019-10-14 LAB — BASIC METABOLIC PANEL
Anion gap: 10 (ref 5–15)
BUN: 15 mg/dL (ref 8–23)
CO2: 22 mmol/L (ref 22–32)
Calcium: 7.8 mg/dL — ABNORMAL LOW (ref 8.9–10.3)
Chloride: 103 mmol/L (ref 98–111)
Creatinine, Ser: 1.04 mg/dL (ref 0.61–1.24)
GFR calc Af Amer: >60 mL/min (ref >60)
GFR calc non Af Amer: >60 mL/min (ref >60)
Glucose, Bld: 132 mg/dL — ABNORMAL HIGH (ref 70–99)
Potassium: 3.6 mmol/L (ref 3.5–5.1)
Sodium: 135 mmol/L (ref 135–145)

## 2019-10-14 LAB — PROTIME-INR
INR: 1.1 (ref 0.8–1.2)
INR: 1.2 (ref 0.8–1.2)
Prothrombin Time: 14.2 seconds (ref 11.4–15.2)
Prothrombin Time: 14.9 seconds (ref 11.4–15.2)

## 2019-10-14 LAB — COMPREHENSIVE METABOLIC PANEL
ALT: 8 U/L (ref 0–44)
AST: 14 U/L — ABNORMAL LOW (ref 15–41)
Albumin: 3.2 g/dL — ABNORMAL LOW (ref 3.5–5.0)
Alkaline Phosphatase: 83 U/L (ref 38–126)
Anion gap: 6 (ref 5–15)
BUN: 15 mg/dL (ref 8–23)
CO2: 26 mmol/L (ref 22–32)
Calcium: 8 mg/dL — ABNORMAL LOW (ref 8.9–10.3)
Chloride: 101 mmol/L (ref 98–111)
Creatinine, Ser: 1.06 mg/dL (ref 0.61–1.24)
GFR calc Af Amer: >60 mL/min (ref >60)
GFR calc non Af Amer: >60 mL/min (ref >60)
Glucose, Bld: 144 mg/dL — ABNORMAL HIGH (ref 70–99)
Potassium: 3.9 mmol/L (ref 3.5–5.1)
Sodium: 133 mmol/L — ABNORMAL LOW (ref 135–145)
Total Bilirubin: 1 mg/dL (ref 0.3–1.2)
Total Protein: 6.8 g/dL (ref 6.5–8.1)

## 2019-10-14 LAB — URINALYSIS, ROUTINE W REFLEX MICROSCOPIC
Bilirubin Urine: NEGATIVE
Glucose, UA: NEGATIVE mg/dL
Hgb urine dipstick: NEGATIVE
Ketones, ur: NEGATIVE mg/dL
Leukocytes,Ua: NEGATIVE
Nitrite: NEGATIVE
Protein, ur: NEGATIVE mg/dL
Specific Gravity, Urine: 1.025 (ref 1.005–1.030)
pH: 5 (ref 5.0–8.0)

## 2019-10-14 LAB — CORTISOL-AM, BLOOD: Cortisol - AM: 12.1 ug/dL (ref 6.7–22.6)

## 2019-10-14 LAB — SARS CORONAVIRUS 2 BY RT PCR (HOSPITAL ORDER, PERFORMED IN ~~LOC~~ HOSPITAL LAB): SARS Coronavirus 2: NEGATIVE

## 2019-10-14 LAB — PROCALCITONIN: Procalcitonin: <0.1 ng/mL

## 2019-10-14 LAB — LACTIC ACID, PLASMA: Lactic Acid, Venous: 1.5 mmol/L (ref 0.5–1.9)

## 2019-10-14 MED ORDER — ATORVASTATIN CALCIUM 20 MG PO TABS
20.0000 mg | ORAL_TABLET | Freq: Every day | ORAL | Status: DC
  Administered 2019-10-14 – 2019-10-17 (×4): 20 mg via ORAL
  Filled 2019-10-14 (×4): qty 1

## 2019-10-14 MED ORDER — CLOPIDOGREL BISULFATE 75 MG PO TABS
75.0000 mg | ORAL_TABLET | Freq: Every day | ORAL | Status: DC
  Administered 2019-10-14 – 2019-10-17 (×4): 75 mg via ORAL
  Filled 2019-10-14 (×4): qty 1

## 2019-10-14 MED ORDER — METOPROLOL TARTRATE 25 MG PO TABS
25.0000 mg | ORAL_TABLET | Freq: Two times a day (BID) | ORAL | Status: DC
  Administered 2019-10-15 – 2019-10-17 (×4): 25 mg via ORAL
  Filled 2019-10-14 (×4): qty 1

## 2019-10-14 MED ORDER — SODIUM CHLORIDE 0.9 % IV SOLN
500.0000 mg | INTRAVENOUS | Status: DC
  Administered 2019-10-14 – 2019-10-16 (×3): 500 mg via INTRAVENOUS
  Filled 2019-10-14 (×4): qty 500

## 2019-10-14 MED ORDER — SODIUM CHLORIDE 0.9 % IV BOLUS
1000.0000 mL | Freq: Once | INTRAVENOUS | Status: AC
  Administered 2019-10-14: 1000 mL via INTRAVENOUS

## 2019-10-14 MED ORDER — ONDANSETRON HCL 4 MG PO TABS: 4.0000 mg | ORAL_TABLET | Freq: Four times a day (QID) | ORAL | Status: DC | PRN

## 2019-10-14 MED ORDER — ACETAMINOPHEN 325 MG PO TABS: 650.0000 mg | ORAL_TABLET | Freq: Four times a day (QID) | ORAL | Status: DC | PRN

## 2019-10-14 MED ORDER — PANTOPRAZOLE SODIUM 40 MG PO TBEC
40.0000 mg | DELAYED_RELEASE_TABLET | Freq: Every day | ORAL | Status: DC
  Administered 2019-10-14: 40 mg via ORAL
  Filled 2019-10-14: qty 1

## 2019-10-14 MED ORDER — BALSALAZIDE DISODIUM 750 MG PO CAPS: 2250.0000 mg | ORAL_CAPSULE | Freq: Three times a day (TID) | ORAL | Status: DC

## 2019-10-14 MED ORDER — ENOXAPARIN SODIUM 40 MG/0.4ML ~~LOC~~ SOLN
40.0000 mg | SUBCUTANEOUS | Status: DC
  Administered 2019-10-14 – 2019-10-17 (×4): 40 mg via SUBCUTANEOUS
  Filled 2019-10-14 (×4): qty 0.4

## 2019-10-14 MED ORDER — ASPIRIN EC 81 MG PO TBEC
81.0000 mg | DELAYED_RELEASE_TABLET | Freq: Every day | ORAL | Status: DC
  Administered 2019-10-14: 81 mg via ORAL
  Filled 2019-10-14: qty 1

## 2019-10-14 MED ORDER — ONDANSETRON HCL 4 MG/2ML IJ SOLN: 4.0000 mg | Freq: Four times a day (QID) | INTRAMUSCULAR | Status: DC | PRN

## 2019-10-14 MED ORDER — ACETAMINOPHEN 650 MG RE SUPP
650.0000 mg | Freq: Four times a day (QID) | RECTAL | Status: DC | PRN
  Administered 2019-10-14 – 2019-10-15 (×2): 650 mg via RECTAL
  Filled 2019-10-14 (×2): qty 1

## 2019-10-14 MED ORDER — GALANTAMINE HYDROBROMIDE 4 MG PO TABS
12.0000 mg | ORAL_TABLET | Freq: Two times a day (BID) | ORAL | Status: DC
  Administered 2019-10-15 – 2019-10-17 (×4): 12 mg via ORAL
  Filled 2019-10-14 (×9): qty 3

## 2019-10-14 MED ORDER — MEMANTINE HCL 5 MG PO TABS
10.0000 mg | ORAL_TABLET | Freq: Two times a day (BID) | ORAL | Status: DC
  Administered 2019-10-14 – 2019-10-17 (×5): 10 mg via ORAL
  Filled 2019-10-14 (×5): qty 2

## 2019-10-14 MED ORDER — SODIUM CHLORIDE 0.9 % IV SOLN
2.0000 g | INTRAVENOUS | Status: DC
  Administered 2019-10-14 – 2019-10-16 (×3): 2 g via INTRAVENOUS
  Filled 2019-10-14 (×2): qty 20
  Filled 2019-10-14: qty 2
  Filled 2019-10-14: qty 20

## 2019-10-14 MED ORDER — SODIUM CHLORIDE 0.9 % IV SOLN: INTRAVENOUS | Status: DC

## 2019-10-14 NOTE — Evaluation (Signed)
Clinical/Bedside Swallow Evaluation Patient Details  Name: Jeff Key MRN: [id number] Date of Birth: March 25, 1945  Today's Date: 10/14/2019 Time: SLP Start Time (ACUTE ONLY): 1129 SLP Stop Time (ACUTE ONLY): 1148 SLP Time Calculation (min) (ACUTE ONLY): 19 min  Past Medical History:  Past Medical History:  Diagnosis Date  . Anemia ~ 11/2015  . CAD (coronary artery disease)    a. inf STEMI on 02/10/16. RCA treated with DES followed by staged PCI of LAD and Circumflex 02/14/16.  Marland Kitchen Dementia (HCC) dx'd 01/2016  . GERD (gastroesophageal reflux disease)   . History of stomach ulcers 1980s?  . Ischemic cardiomyopathy    a. 01/2016: EF 40-45%.    Past Surgical History:  Past Surgical History:  Procedure Laterality Date  . CARDIAC CATHETERIZATION N/A 02/10/2016   Procedure: Left Heart Cath and Coronary Angiography;  Surgeon: Runell Gess, MD;  Location: [facility] INVASIVE CV LAB;  Service: Cardiovascular;  Laterality: N/A;  . CARDIAC CATHETERIZATION N/A 02/10/2016   Procedure: Coronary Stent Intervention;  Surgeon: Runell Gess, MD;  Location: [facility] INVASIVE CV LAB;  Service: Cardiovascular;  Laterality: N/A;  . CARDIAC CATHETERIZATION N/A 02/14/2016   Procedure: Coronary Stent Intervention-LAD and CFX;  Surgeon: Runell Gess, MD;  Location: [facility] INVASIVE CV LAB;  Service: Cardiovascular;  Laterality: N/A;  . COLONOSCOPY WITH PROPOFOL N/A 05/06/2015   Procedure: COLONOSCOPY WITH PROPOFOL;  Surgeon: Wallace Cullens, MD;  Location: [facility] ENDOSCOPY;  Service: Gastroenterology;  Laterality: N/A;  . CORONARY ANGIOPLASTY WITH STENT PLACEMENT  02/14/2016   "2 stents"  . ESOPHAGOGASTRODUODENOSCOPY (EGD) WITH PROPOFOL N/A 05/06/2015   Procedure: ESOPHAGOGASTRODUODENOSCOPY (EGD) WITH PROPOFOL;  Surgeon: Wallace Cullens, MD;  Location: [facility] ENDOSCOPY;  Service: Gastroenterology;  Laterality: N/A;  . FOREARM FRACTURE SURGERY Right 1985   "got it tore up in [employer]"  . FRACTURE SURGERY     HPI:  Jeff Key is a 74 y.o. 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  Unable to obtain history from patient due to altered mental status.  Chest x-ray showed mild bibasilar atelectasis. Of note, atelectasis has been present in all chest x-rays dating back to 2019.  Head CT with no acute findings. Pt's wife at bedside and deosn't report any overt s/s of dysphagia or aspiration with any POs.    Assessment / Plan / Recommendation Clinical Impression  Pt presents with adequate oropharyngeal abilities when consuming thin liquids via straw, puree and soft solids. Pt requires extensive cues to consume POs and requires Total assistance for self-feeding d/t significant cognitive deficits. Pt's wife requested a softer diet that she could use a fork/spoon to help feed pt. At this time, recommend dysphagia 3 diet with thin liquids, medicine whole with thins (medicine may need to be presented with applesauce d/t cognitive deficits). At this time, skilled ST intervention is not indicated. ST to sign off.  SLP Visit Diagnosis: Dysphagia, unspecified (R13.10)    Aspiration Risk  Mild aspiration risk    Diet Recommendation   Dysphagia 3 diet with thin liquids via straw  Medication Administration: Whole meds with liquid    Other  Recommendations Oral Care Recommendations: Oral care BID   Follow up Recommendations None        Swallow  Study   General Date of Onset: 10/13/19 HPI: Jeff Key is a 74 y.o. 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  Unable to obtain history from patient due to altered mental status.  Chest x-ray showed mild bibasilar atelectasis. Of note, atelectasis has been present in all chest x-rays dating back to 2019.  Head CT with no acute findings. Pt's wife at bedside and deosn't report any overt s/s of dysphagia or aspiration with any POs.  Type of Study: Bedside Swallow Evaluation Previous Swallow Assessment: none in chart Diet Prior to this Study: Regular;Thin liquids Temperature Spikes Noted: No Respiratory Status: Nasal cannula History of Recent Intubation: No Behavior/Cognition: Alert;Distractible;Requires cueing;Doesn't follow directions;Confused Oral Cavity Assessment: Within Functional Limits Oral Care Completed by SLP: No Oral Cavity - Dentition: Adequate natural dentition Self-Feeding Abilities: Total assist Patient Positioning: Upright in bed Baseline Vocal Quality: Normal Volitional Cough: Strong Volitional Swallow: Unable to elicit    Oral/Motor/Sensory Function Overall Oral Motor/Sensory Function: Within functional limits   Ice Chips Ice chips: Within functional limits Presentation: Spoon   Thin Liquid Thin Liquid: Within functional limits Presentation: Spoon;Straw    Nectar Thick Nectar Thick Liquid: Not tested   Honey Thick Honey Thick Liquid: Not tested   Puree Puree: Within functional limits Presentation: Spoon   Solid     Solid: Within functional limits Presentation: Spoon Other Comments: dysphagia 3 textures     Elvera Almario B. Dreama Saa M.S., CCC-SLP, [facility] Speech-Language Pathologist Rehabilitation Services Office [phone]  Terena Bohan Dreama Saa 10/14/2019,12:13 PM

## 2019-10-14 NOTE — Progress Notes (Signed)
CODE SEPSIS - PHARMACY COMMUNICATION  **Broad Spectrum Antibiotics should be administered within 1 hour of Sepsis diagnosis**  Time Code Sepsis Called/Page Received: 0146  Antibiotics Ordered: vanc/zosyn  Time of 1st antibiotic administration: 2256  Additional action taken by pharmacy:   If necessary, Name of Provider/Nurse Contacted:     Thomasene Ripple ,PharmD Clinical Pharmacist  10/14/2019  1:52 AM

## 2019-10-14 NOTE — ED Notes (Signed)
Pt provided lunch tray at bedside. Wife assisted w/ meal pt ate 100%.

## 2019-10-14 NOTE — Progress Notes (Signed)
Patient was seen and examined.  Wife at the bedside.  Admitted early morning hours by nighttime hospitalist.  Brief, 74 year old gentleman with advanced dementia, ulcerative colitis brought to the ER with worsening mentation and low-grade fever.  On arrival, temperature 102.7, tachycardic and tachypneic.  Initial systolic blood pressure less than 90, responded to IV fluid resuscitation.  Sepsis, present on admission, adequately resuscitated.  Blood cultures and urine cultures done.  Chest x-ray not impressive, however patient does have respiratory symptoms including dry cough and shortness of breath.  On Rocephin and azithromycin. Speech therapy consulted to evaluate for aspiration pneumonia. Consult PT OT. Discussed about CODE STATUS, wife was not very educated, I encouraged her to talk to palliative care team.  Consult placed. Since he is hemodynamically resuscitated, he can be transferred to MedSurg bed. Given advanced dementia, will allow patient's wife to stay in the hospital at night.

## 2019-10-14 NOTE — H&P (Addendum)
History and Physical    Jeff Key VPX:[id number] DOB: [birth year] DOA: 10/13/2019  PCP: [name], MD   Patient coming from: home  I have personally briefly reviewed patient's old medical records in [facility]  Chief Complaint: Altered mental status  HPI: Jeff Key is a 73 y.o. 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  Unable to obtain history from patient due to altered mental status.  History obtained from wife at bedside ED Course: On arrival, temperature 102.7, tachycardic at 113, tachypneic at 23, systolic blood pressure in the 90s with O2 sat 95% on 2 L.  WBC 17,000 with lactic acid 2.5 improving to 1.5 with initial treatment in the ER.  Urinalysis unremarkable.  Chest x-rayShowed mild bibasilar atelectasis.  Head CT with no acute findings.  Patient started on vancomycin and Zosyn.  Hospitalist consulted for admission.  Review of Systems: Unable to obtain due to altered mental status   Past Medical History:  Diagnosis Date  . Anemia ~ 11/2015  . CAD (coronary artery disease)    a. inf STEMI on 02/10/16. RCA treated with DES followed by staged PCI of LAD and Circumflex 02/14/16.  Marland Kitchen Dementia (HCC) dx'd 01/2016  . GERD (gastroesophageal reflux disease)   . History of stomach ulcers 1980s?  . Ischemic cardiomyopathy    a. 01/2016: EF 40-45%.     Past Surgical History:  Procedure Laterality Date  . CARDIAC CATHETERIZATION N/A 02/10/2016   Procedure: Left Heart Cath and Coronary Angiography;  Surgeon: Runell Gess, MD;  Location: [facility] INVASIVE CV LAB;  Service: Cardiovascular;  Laterality: N/A;  . CARDIAC CATHETERIZATION N/A 02/10/2016   Procedure: Coronary Stent Intervention;  Surgeon:  Runell Gess, MD;  Location: [facility] INVASIVE CV LAB;  Service: Cardiovascular;  Laterality: N/A;  . CARDIAC CATHETERIZATION N/A 02/14/2016   Procedure: Coronary Stent Intervention-LAD and CFX;  Surgeon: Runell Gess, MD;  Location: [facility] INVASIVE CV LAB;  Service: Cardiovascular;  Laterality: N/A;  . COLONOSCOPY WITH PROPOFOL N/A 05/06/2015   Procedure: COLONOSCOPY WITH PROPOFOL;  Surgeon: Wallace Cullens, MD;  Location: [facility] ENDOSCOPY;  Service: Gastroenterology;  Laterality: N/A;  . CORONARY ANGIOPLASTY WITH STENT PLACEMENT  02/14/2016   "2 stents"  . ESOPHAGOGASTRODUODENOSCOPY (EGD) WITH PROPOFOL N/A 05/06/2015   Procedure: ESOPHAGOGASTRODUODENOSCOPY (EGD) WITH PROPOFOL;  Surgeon: Wallace Cullens, MD;  Location: [facility] ENDOSCOPY;  Service: Gastroenterology;  Laterality: N/A;  . FOREARM FRACTURE SURGERY Right 1985   "got it tore up in [employer]"  . FRACTURE SURGERY       reports that he quit smoking about 31 years ago. His smoking use included cigarettes. He has a 60.00 pack-year smoking history. He has never used smokeless tobacco. He reports that he does not drink alcohol and does not use drugs.  No Known Allergies  Family History  Problem Relation Age of Onset  . Prostate cancer Father   . Bladder Cancer Neg Hx   . Kidney cancer Neg Hx       Prior to Admission medications   Medication Sig Start Date End Date Taking? Authorizing Provider  aspirin EC 81 MG tablet Take 81 mg by mouth daily.    [provider]  atorvastatin (LIPITOR) 20 MG tablet Take 1  tablet (20 mg total) by mouth daily. 04/22/19   Runell Gess, MD  BRILINTA 90 MG TABS tablet TAKE ONE TABLET BY MOUTH TWICE DAILY 03/14/17   Runell Gess, MD  clopidogrel (PLAVIX) 75 MG tablet Take 1 tablet (75 mg total) by mouth daily. 06/15/17   Runell Gess, MD  ferrous sulfate 325 (65 FE) MG tablet Take 325 mg by mouth daily with breakfast.    [provider]  galantamine (RAZADYNE) 4 MG tablet Take 4 mg by mouth 2  (two) times daily with a meal.  01/31/16 04/22/19  [provider]  lisinopril (PRINIVIL,ZESTRIL) 10 MG tablet Take 1 tablet by mouth once daily 06/10/18   Runell Gess, MD  memantine (NAMENDA) 5 MG tablet Take 1 tablet by mouth 2 (two) times daily. 05/07/17 04/22/19  [provider]  metoprolol tartrate (LOPRESSOR) 25 MG tablet Take 1 tablet (25 mg total) by mouth 2 (two) times daily. 06/10/19   Runell Gess, MD  nitroGLYCERIN (NITROSTAT) 0.4 MG SL tablet Place 1 tablet (0.4 mg total) under the tongue every 5 (five) minutes as needed for chest pain. 02/15/16   Janetta Hora, PA-C  omeprazole (PRILOSEC) 20 MG capsule Take 20 mg by mouth daily.    [provider]  vitamin B-12 (CYANOCOBALAMIN) 1000 MCG tablet Take 1,000 mcg by mouth daily.    [provider]    Physical Exam: Vitals:   10/13/19 2220 10/13/19 2230 10/13/19 2231 10/13/19 2300  BP: (!) 132/85 107/80  (!) 106/52  Pulse: 99 105  99  Resp:  17    Temp:      TempSrc:      SpO2:  90% 92% 93%  Weight:      Height:         Vitals:   10/13/19 2220 10/13/19 2230 10/13/19 2231 10/13/19 2300  BP: (!) 132/85 107/80  (!) 106/52  Pulse: 99 105  99  Resp:  17    Temp:      TempSrc:      SpO2:  90% 92% 93%  Weight:      Height:          Constitutional:  Lethargic and difficult to arouse.  Ill-appearing HEENT:      Head: Normocephalic and atraumatic.         Eyes: PERLA, EOMI, Conjunctivae are normal. Sclera is non-icteric.       Mouth/Throat: Mucous membranes are moist.       Neck: Supple with no signs of meningismus. Cardiovascular: Regular rate and rhythm. No murmurs, gallops, or rubs. 2+ symmetrical distal pulses are present . No JVD. No LE edema Respiratory: Respiratory effort increased.Lungs sounds diminished bilaterally.  Coarse breath sounds Gastrointestinal: Soft, non tender, and non distended with positive bowel sounds. No rebound or guarding. Genitourinary: No CVA  tenderness. Musculoskeletal: Nontender with normal range of motion in all extremities. No cyanosis, or erythema of extremities. Neurologic: . Face is symmetric. Moving all extremities. No gross focal neurologic deficits . Skin: Skin is warm, dry.  No rash or ulcers Psychiatric: Lethargic.  Unable to assess  Labs on Admission: I have personally reviewed following labs and imaging studies  CBC: Recent Labs  Lab 10/13/19 2204  WBC 17.8*  NEUTROABS 14.7*  HGB 10.9*  HCT 35.5*  MCV 93.7  PLT 252   Basic Metabolic Panel: Recent Labs  Lab 10/14/19 0102  NA 133*  K 3.9  CL 101  CO2 26  GLUCOSE 144*  BUN 15  CREATININE 1.06  CALCIUM 8.0*   GFR: Estimated Creatinine Clearance: 72.7 mL/min (by C-G formula based on SCr of 1.06 mg/dL). Liver Function Tests: Recent Labs  Lab 10/14/19 0102  AST 14*  ALT 8  ALKPHOS 83  BILITOT 1.0  PROT 6.8  ALBUMIN 3.2*   No results for input(s): LIPASE, AMYLASE in the last 168 hours. No results for input(s): AMMONIA in the last 168 hours. Coagulation Profile: Recent Labs  Lab 10/14/19 0102  INR 1.1   Cardiac Enzymes: No results for input(s): CKTOTAL, CKMB, CKMBINDEX, TROPONINI in the last 168 hours. BNP (last 3 results) No results for input(s): PROBNP in the last 8760 hours. HbA1C: No results for input(s): HGBA1C in the last 72 hours. CBG: No results for input(s): GLUCAP in the last 168 hours. Lipid Profile: No results for input(s): CHOL, HDL, LDLCALC, TRIG, CHOLHDL, LDLDIRECT in the last 72 hours. Thyroid Function Tests: No results for input(s): TSH, T4TOTAL, FREET4, T3FREE, THYROIDAB in the last 72 hours. Anemia Panel: No results for input(s): VITAMINB12, FOLATE, FERRITIN, TIBC, IRON, RETICCTPCT in the last 72 hours. Urine analysis:    Component Value Date/Time   COLORURINE YELLOW (A) 10/14/2019 0103   APPEARANCEUR CLEAR (A) 10/14/2019 0103   APPEARANCEUR Clear 10/16/2014 1539   LABSPEC 1.025 10/14/2019 0103   PHURINE 5.0  10/14/2019 0103   GLUCOSEU NEGATIVE 10/14/2019 0103   HGBUR NEGATIVE 10/14/2019 0103   BILIRUBINUR NEGATIVE 10/14/2019 0103   BILIRUBINUR Negative 10/16/2014 1539   KETONESUR NEGATIVE 10/14/2019 0103   PROTEINUR NEGATIVE 10/14/2019 0103   NITRITE NEGATIVE 10/14/2019 0103   LEUKOCYTESUR NEGATIVE 10/14/2019 0103    Radiological Exams on Admission: CT Head Wo Contrast  Result Date: 10/13/2019 CLINICAL DATA:  Delirium EXAM: CT HEAD WITHOUT CONTRAST TECHNIQUE: Contiguous axial images were obtained from the base of the skull through the vertex without intravenous contrast. COMPARISON:  Aug 18, 2019 FINDINGS: Brain: No evidence of acute territorial infarction, hemorrhage, hydrocephalus,extra-axial collection or mass lesion/mass effect. There is dilatation the ventricles and sulci consistent with age-related atrophy. Low-attenuation changes in the deep white matter consistent with small vessel ischemia. Vascular: No hyperdense vessel. Calcifications are seen within the internal carotid artery. Skull: The skull is intact. No fracture or focal lesion identified. Sinuses/Orbits: The visualized paranasal sinuses and mastoid air cells are clear. The orbits and globes intact. Other: None IMPRESSION: No acute intracranial abnormality. Findings consistent with age related atrophy and chronic small vessel ischemia Electronically Signed   By: Jonna ClarkBindu  Avutu M.D.   On: 10/13/2019 22:47   DG Chest Port 1 View  Result Date: 10/13/2019 CLINICAL DATA:  Fevers and altered mental status EXAM: PORTABLE CHEST 1 VIEW COMPARISON:  09/03/2019 FINDINGS: Cardiac shadow is enlarged but stable. The lungs are well aerated bilaterally. Mild elevation of the right hemidiaphragm is seen. Minimal bibasilar atelectatic changes are noted. No bony abnormality is noted. IMPRESSION: Mild bibasilar atelectasis. Electronically Signed   By: Alcide CleverMark  Lukens M.D.   On: 10/13/2019 22:11    EKG: Independently reviewed. Interpretation : Normal sinus  rhythm with nonspecific ST-T wave changes  Assessment/Plan 74 year old male with history of CAD, dementia, diastolic heart failure secondary to ischemic cardiomyopathy, last EF 40 to 45% 2018, ulcerative colitis admitted with sepsis secondary to suspected pneumonia after presenting with altered mental status.  Hypoxic with EMS, with EMS reporting an O2 sat 85%   Sepsis, severe Suspect CAP (community acquired pneumonia)   Acute respiratory failure with hypoxia (HCC) -Patient presents with AMS, O2 sat  85 with EMS, leukocytosis of 17,000 with lactic acid 2.5, bibasilar atelectasis on chest x-ray with no other source of infection, febrile and tachycardic and hypotensive -Rocephin and azithromycin for suspected pneumonia -Sepsis fluids -Supplemental O2 to keep sats over 94%  Acute metabolic encephalopathy -CT head with no acute findings and patient has no focal deficits -Suspect secondary to sepsis above -Fall and aspiration precautions -Neurologic checks    CAD (coronary artery disease) -Continue antiplatelets, statins and beta-blockers pending med rec  Diastolic heart failure secondary to ischemic cardiomyopathy -Last EF 40 to 45% in 2018 -Monitor for fluid overload with IV hydration with treatment of sepsis -Daily weights    Alzheimer's dementia without behavioral disturbance (HCC) -Continue galantamine and memantine pending med rec    Ulcerative colitis (HCC) -Followed by GI.  No acute concerns    DVT prophylaxis: Lovenox  Code Status: full code  Family Communication: Wife at bedside Disposition Plan: Back to previous home environment Consults called: none  Status:At the time of admission, it appears that the appropriate admission status for this patient is INPATIENT. This is judged to be reasonable and necessary in order to provide the required intensity of service to ensure the patient's safety given the presenting symptoms, physical exam findings, and initial radiographic and  laboratory data in the context of their  Comorbid conditions.   Patient requires inpatient status due to high intensity of service, high risk for further deterioration and high frequency of surveillance required.   I certify that at the point of admission it is my clinical judgment that the patient will require inpatient hospital care spanning beyond 2 midnights     Andris Baumann MD [facility]     10/14/2019, 1:49 AM

## 2019-10-14 NOTE — ED Notes (Signed)
SLP at bedside.

## 2019-10-14 NOTE — ED Notes (Addendum)
This RN noticed specimen container at bedside that contains a brown bug. This RN unsure what type of specimen and if it was for current pt. This RN and Vernona Rieger, RN changed pt's linens. Pt placed in clean gown. Pt's brief remains clean and dry at this time.

## 2019-10-14 NOTE — ED Provider Notes (Signed)
-----------------------------------------   1:41 AM on 10/14/2019 -----------------------------------------  Repeat lactate trending down.  Blood pressure stable.  Atelectasis noted on chest x-ray with negative urine; patient was hypoxic, suspect developing pneumonia.  Patient was covered with broad-spectrum IV antibiotics.  Discussed with hospital services for admission.   Irean Hong, MD 10/14/19 [phone]

## 2019-10-15 ENCOUNTER — Encounter: Payer: Self-pay | Admitting: Internal Medicine

## 2019-10-15 LAB — CBC WITH DIFFERENTIAL/PLATELET
Abs Immature Granulocytes: 0.07 10*3/uL (ref 0.00–0.07)
Basophils Absolute: 0 10*3/uL (ref 0.0–0.1)
Basophils Relative: 0 %
Eosinophils Absolute: 0.3 10*3/uL (ref 0.0–0.5)
Eosinophils Relative: 2 %
HCT: 28.3 % — ABNORMAL LOW (ref 39.0–52.0)
Hemoglobin: 9.4 g/dL — ABNORMAL LOW (ref 13.0–17.0)
Immature Granulocytes: 1 %
Lymphocytes Relative: 15 %
Lymphs Abs: 2.1 10*3/uL (ref 0.7–4.0)
MCH: 29.7 pg (ref 26.0–34.0)
MCHC: 33.2 g/dL (ref 30.0–36.0)
MCV: 89.3 fL (ref 80.0–100.0)
Monocytes Absolute: 1.4 10*3/uL — ABNORMAL HIGH (ref 0.1–1.0)
Monocytes Relative: 10 %
Neutro Abs: 9.7 10*3/uL — ABNORMAL HIGH (ref 1.7–7.7)
Neutrophils Relative %: 72 %
Platelets: 205 10*3/uL (ref 150–400)
RBC: 3.17 MIL/uL — ABNORMAL LOW (ref 4.22–5.81)
RDW: 12.9 % (ref 11.5–15.5)
WBC: 13.5 10*3/uL — ABNORMAL HIGH (ref 4.0–10.5)
nRBC: 0 % (ref 0.0–0.2)

## 2019-10-15 LAB — COMPREHENSIVE METABOLIC PANEL
ALT: 7 U/L (ref 0–44)
AST: 14 U/L — ABNORMAL LOW (ref 15–41)
Albumin: 2.8 g/dL — ABNORMAL LOW (ref 3.5–5.0)
Alkaline Phosphatase: 66 U/L (ref 38–126)
Anion gap: 9 (ref 5–15)
BUN: 12 mg/dL (ref 8–23)
CO2: 23 mmol/L (ref 22–32)
Calcium: 7.8 mg/dL — ABNORMAL LOW (ref 8.9–10.3)
Chloride: 105 mmol/L (ref 98–111)
Creatinine, Ser: 0.97 mg/dL (ref 0.61–1.24)
GFR calc Af Amer: >60 mL/min (ref >60)
GFR calc non Af Amer: >60 mL/min (ref >60)
Glucose, Bld: 105 mg/dL — ABNORMAL HIGH (ref 70–99)
Potassium: 3.5 mmol/L (ref 3.5–5.1)
Sodium: 137 mmol/L (ref 135–145)
Total Bilirubin: 0.7 mg/dL (ref 0.3–1.2)
Total Protein: 6.4 g/dL — ABNORMAL LOW (ref 6.5–8.1)

## 2019-10-15 LAB — URINE CULTURE: Culture: NO GROWTH

## 2019-10-15 LAB — PHOSPHORUS: Phosphorus: 3.1 mg/dL (ref 2.5–4.6)

## 2019-10-15 LAB — MAGNESIUM: Magnesium: 1.7 mg/dL (ref 1.7–2.4)

## 2019-10-15 MED ORDER — PANTOPRAZOLE SODIUM 40 MG PO PACK
40.0000 mg | PACK | Freq: Every day | ORAL | Status: DC
  Filled 2019-10-15 (×2): qty 20

## 2019-10-15 MED ORDER — ASPIRIN 81 MG PO CHEW
81.0000 mg | CHEWABLE_TABLET | Freq: Every day | ORAL | Status: DC
  Administered 2019-10-15 – 2019-10-17 (×3): 81 mg via ORAL
  Filled 2019-10-15 (×3): qty 1

## 2019-10-15 MED ORDER — CHLORHEXIDINE GLUCONATE CLOTH 2 % EX PADS
6.0000 | MEDICATED_PAD | Freq: Every day | CUTANEOUS | Status: DC
  Administered 2019-10-15: 6 via TOPICAL

## 2019-10-15 MED ORDER — ALBUTEROL SULFATE (2.5 MG/3ML) 0.083% IN NEBU: 2.5000 mg | INHALATION_SOLUTION | Freq: Four times a day (QID) | RESPIRATORY_TRACT | Status: DC | PRN

## 2019-10-15 MED ORDER — BALSALAZIDE DISODIUM 750 MG PO CAPS
2250.0000 mg | ORAL_CAPSULE | Freq: Three times a day (TID) | ORAL | Status: AC
  Administered 2019-10-15 – 2019-10-16 (×2): 2250 mg via ORAL
  Filled 2019-10-15 (×4): qty 3

## 2019-10-15 MED ORDER — ALBUTEROL SULFATE (2.5 MG/3ML) 0.083% IN NEBU: 2.5000 mg | INHALATION_SOLUTION | Freq: Once | RESPIRATORY_TRACT | Status: DC | PRN

## 2019-10-15 NOTE — Progress Notes (Signed)
PT Cancellation Note  Patient Details Name: JABIER DEESE MRN: [id number] DOB: [birth year]   Cancelled Treatment:    Reason Eval/Treat Not Completed: Fatigue/lethargy limiting ability to participate. Patient sleeping, wife present states patient had a busy day so far. Will hold PT eval until tomorrow. Wife in agreement.     Alby Schwabe 10/15/2019, 3:41 PM

## 2019-10-15 NOTE — Progress Notes (Signed)
OT Cancellation Note  Patient Details Name: Jeff Key MRN: [id number] DOB: Jul 30, 1945   Cancelled Treatment:    Reason Eval/Treat Not Completed: Fatigue/lethargy limiting ability to participate. OT order received and chart reviewed. Upon arrival, pt's wife reports recently fell asleep. Pt does not wake to voice, light touch, deep pressure, or repositioning head for comfort. Will attempt evaluation at later date/time as able.   Kathie Dike, M.S. OTR/L  10/15/19, 1:26 PM

## 2019-10-15 NOTE — ED Notes (Signed)
Respiratory has been called to come and assess patient. RT will be at patient room ASAP.

## 2019-10-15 NOTE — Progress Notes (Signed)
Palliative:  Consult received and chart reviewed. Briefly met with patient wife at bedside. Patient asleep during my visit. Explained palliative care role to wife - she tells me she is exhausted today and asks that we schedule goals of care discussion for tomorrow. Will plan to visit with her after lunch tomorrow.  Juel Burrow, DNP, AGNP-C Palliative Medicine Team Team Phone # [phone]  Pager # [phone]  NO CHARGE

## 2019-10-15 NOTE — Progress Notes (Signed)
Verbal order from MD Allena Katz to remove foley catheter. Foley care complete prior to removal, pt tolerated well. External catheter applied

## 2019-10-15 NOTE — Progress Notes (Signed)
[facility]  - [place] at [facility]   PATIENT NAME: Jeff Key    MR#:  [id number]  DATE OF BIRTH:  [birth year]  SUBJECTIVE:   Patient had fever of 100.1. He has baseline dementia unable to communicate much. Wife in the room gives much history. Noticing weakness and fever at home. Patient is dependent on some of the ADLs. He is ambulating in the house with someone next to him. No vomiting. REVIEW OF SYSTEMS:   Review of Systems  Constitutional: Negative for chills, fever and weight loss.  HENT: Negative for ear discharge, ear pain and nosebleeds.   Eyes: Negative for blurred vision, pain and discharge.  Respiratory: Negative for sputum production, shortness of breath, wheezing and stridor.   Cardiovascular: Negative for chest pain, palpitations, orthopnea and PND.  Gastrointestinal: Negative for abdominal pain, diarrhea, nausea and vomiting.  Genitourinary: Negative for frequency and urgency.  Musculoskeletal: Negative for back pain and joint pain.  Neurological: Negative for sensory change, speech change, focal weakness and weakness.  Psychiatric/Behavioral: Negative for depression and hallucinations. The patient is not nervous/anxious.    Tolerating Diet:yes Tolerating PT: pending  DRUG ALLERGIES:  No Known Allergies  VITALS:  Blood pressure 124/78, pulse 66, temperature 98.4 F (36.9 C), temperature source Oral, resp. rate 18, height 5\' 9"  (1.753 m), weight (!) 104.3 kg, SpO2 96 %.  PHYSICAL EXAMINATION:   Physical Exam  GENERAL:  74 y.o.-year-old patient lying in the bed with no acute distress. Obese EYES: Pupils equal, round, reactive to light and accommodation. No scleral icterus.   HEENT: Head atraumatic, normocephalic. Oropharynx and nasopharynx clear.  NECK:  Supple, no jugular venous distention. No thyroid enlargement, no tenderness.  LUNGS: Normal breath sounds bilaterally, expiratory  wheezing, no rales, no rhonchi. No use of accessory  muscles of respiration.  CARDIOVASCULAR: S1, S2 normal. No murmurs, rubs, or gallops.  ABDOMEN: Soft, nontender, nondistended. Bowel sounds present. No organomegaly or mass.  EXTREMITIES: No cyanosis, clubbing or edema b/l.    NEUROLOGIC: Cranial nerves II through XII are intact. No focal Motor or sensory deficits b/l.   PSYCHIATRIC:  patient is alert and oriented x 3.  SKIN: No obvious rash, lesion, or ulcer.   LABORATORY PANEL:  CBC Recent Labs  Lab 10/15/19 0351  WBC 13.5*  HGB 9.4*  HCT 28.3*  PLT 205    Chemistries  Recent Labs  Lab 10/15/19 0351  NA 137  K 3.5  CL 105  CO2 23  GLUCOSE 105*  BUN 12  CREATININE 0.97  CALCIUM 7.8*  MG 1.7  AST 14*  ALT 7  ALKPHOS 66  BILITOT 0.7   Cardiac Enzymes No results for input(s): TROPONINI in the last 168 hours. RADIOLOGY:  CT Head Wo Contrast  Result Date: 10/13/2019 CLINICAL DATA:  Delirium EXAM: CT HEAD WITHOUT CONTRAST TECHNIQUE: Contiguous axial images were obtained from the base of the skull through the vertex without intravenous contrast. COMPARISON:  Aug 18, 2019 FINDINGS: Brain: No evidence of acute territorial infarction, hemorrhage, hydrocephalus,extra-axial collection or mass lesion/mass effect. There is dilatation the ventricles and sulci consistent with age-related atrophy. Low-attenuation changes in the deep white matter consistent with small vessel ischemia. Vascular: No hyperdense vessel. Calcifications are seen within the internal carotid artery. Skull: The skull is intact. No fracture or focal lesion identified. Sinuses/Orbits: The visualized paranasal sinuses and mastoid air cells are clear. The orbits and globes intact. Other: None IMPRESSION: No acute intracranial abnormality. Findings consistent with age related atrophy and  chronic small vessel ischemia Electronically Signed   By: Jeff Key M.D.   On: 10/13/2019 22:47   DG Chest Port 1 View  Result Date: 10/13/2019 CLINICAL DATA:  Fevers and altered  mental status EXAM: PORTABLE CHEST 1 VIEW COMPARISON:  09/03/2019 FINDINGS: Cardiac shadow is enlarged but stable. The lungs are well aerated bilaterally. Mild elevation of the right hemidiaphragm is seen. Minimal bibasilar atelectatic changes are noted. No bony abnormality is noted. IMPRESSION: Mild bibasilar atelectasis. Electronically Signed   By: Jeff Key M.D.   On: 10/13/2019 22:11   ASSESSMENT AND PLAN:   Jeff Key is a 74 y.o. 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  Unable to obtain history from patient due to altered mental status.  History obtained from wife at bedside.  Sepsis POA Suspect CAP (community acquired pneumonia)  Acute respiratory failure with hypoxia (HCC) -Patient presents with AMS, O2 sat 85 with EMS, leukocytosis of 17,000 with lactic acid 2.5, bibasilar atelectasis on chest x-ray with no other source of infection, febrile and tachycardic and hypotensive -Rocephin and azithromycin for suspected pneumonia -Sepsis fluids -Supplemental O2 to keep sats over 94% --breathing rx's as needed -- blood culture so far negative. Urine culture negative. --COVID negative -- white count 19.1 K-- 13.5 K  Acute metabolic encephalopathy with underlying dementia -CT head with no acute findings and patient has no focal deficits -Suspect secondary to sepsis above -Fall and aspiration precautions -no neuro deficits    CAD (coronary artery disease) -Continue antiplatelets, statins and beta-blockers  Diastolic heart failure secondary to ischemic cardiomyopathy -Last EF 40 to 45% in 2018 -Monitor for fluid overload with IV hydration with treatment of sepsis -Daily weights    Alzheimer's dementia  without behavioral disturbance (HCC) -Continue galantamine and memantine    Ulcerative colitis (HCC) -Followed by GI.  No acute concerns -cont  Balsalazide    DVT prophylaxis: Lovenox  Code Status: full code  Family Communication: Wife at bedside Disposition Plan: Back to previous home environment Consults called: none  Status:At the time of admission, it appears that the appropriate admission status for this patient is INPATIENT.T Patient requires inpatient status due to high intensity of service, high risk for further deterioration and high frequency of surveillance required.   Workup for sepsis. Patient has low-grade fever.  PT evaluation pending.   TOTAL TIME TAKING CARE OF THIS PATIENT: *25* minutes.  >50% time spent on counselling and coordination of care  Note: This dictation was prepared with Dragon dictation along with smaller phrase technology. Any transcriptional errors that result from this process are unintentional.  Enedina Finner M.D    [facility]   CC: Primary care physician; [name], MDPatient ID: Lillia Dallas, male   DOB: May 12, 1945, 74 y.o.   MRN: [id number]

## 2019-10-16 ENCOUNTER — Inpatient Hospital Stay: Payer: PPO

## 2019-10-16 DIAGNOSIS — Z7189 Other specified counseling: Secondary | ICD-10-CM

## 2019-10-16 DIAGNOSIS — Z515 Encounter for palliative care: Secondary | ICD-10-CM

## 2019-10-16 DIAGNOSIS — G300 Alzheimer's disease with early onset: Secondary | ICD-10-CM

## 2019-10-16 DIAGNOSIS — F0280 Dementia in other diseases classified elsewhere without behavioral disturbance: Secondary | ICD-10-CM

## 2019-10-16 LAB — BRAIN NATRIURETIC PEPTIDE: B Natriuretic Peptide: 321.8 pg/mL — ABNORMAL HIGH (ref 0.0–100.0)

## 2019-10-16 MED ORDER — AZITHROMYCIN 250 MG PO TABS
500.0000 mg | ORAL_TABLET | Freq: Every day | ORAL | Status: DC
  Administered 2019-10-17: 500 mg via ORAL
  Filled 2019-10-16: qty 2

## 2019-10-16 MED ORDER — BALSALAZIDE DISODIUM 750 MG PO CAPS
2250.0000 mg | ORAL_CAPSULE | Freq: Three times a day (TID) | ORAL | Status: AC
  Administered 2019-10-16: 2250 mg via ORAL
  Filled 2019-10-16: qty 3

## 2019-10-16 MED ORDER — PANTOPRAZOLE SODIUM 40 MG PO TBEC
40.0000 mg | DELAYED_RELEASE_TABLET | Freq: Every day | ORAL | Status: DC
  Administered 2019-10-16 – 2019-10-17 (×2): 40 mg via ORAL
  Filled 2019-10-16 (×2): qty 1

## 2019-10-16 NOTE — Progress Notes (Signed)
[facility]  - [place] at [facility]   PATIENT NAME: Jeff Key    MR#:  [id number]  DATE OF BIRTH:  08-26-1945  SUBJECTIVE:  patient smiling today. Unable to communicate much. No fever documented. Eating very well according to the wife in the room.  Did work work with physical therapy earlier REVIEW OF SYSTEMS:   Review of Systems  Constitutional: Negative for chills, fever and weight loss.  HENT: Negative for ear discharge, ear pain and nosebleeds.   Eyes: Negative for blurred vision, pain and discharge.  Respiratory: Negative for sputum production, shortness of breath, wheezing and stridor.   Cardiovascular: Negative for chest pain, palpitations, orthopnea and PND.  Gastrointestinal: Negative for abdominal pain, diarrhea, nausea and vomiting.  Genitourinary: Negative for frequency and urgency.  Musculoskeletal: Negative for back pain and joint pain.  Neurological: Negative for sensory change, speech change, focal weakness and weakness.  Psychiatric/Behavioral: Negative for depression and hallucinations. The patient is not nervous/anxious.    Tolerating Diet:yes Tolerating PT: HHPT  DRUG ALLERGIES:  No Known Allergies  VITALS:  Blood pressure 112/65, pulse 62, temperature 99.2 F (37.3 C), temperature source Oral, resp. rate 18, height 5\' 9"  (1.753 m), weight (!) 104.3 kg, SpO2 96 %.  PHYSICAL EXAMINATION:   Physical Exam  GENERAL:  74 y.o.-year-old patient lying in the bed with no acute distress. Obese EYES: Pupils equal, round, reactive to light and accommodation. No scleral icterus.   HEENT: Head atraumatic, normocephalic. Oropharynx and nasopharynx clear.  NECK:  Supple, no jugular venous distention. No thyroid enlargement, no tenderness.  LUNGS: Normal breath sounds bilaterally, no expiratory  wheezing, no rales, no rhonchi. No use of accessory muscles of respiration.  CARDIOVASCULAR: S1, S2 normal. No murmurs, rubs, or gallops.  ABDOMEN: Soft,  nontender, nondistended. Bowel sounds present. No organomegaly or mass.  EXTREMITIES: No cyanosis, clubbing or edema b/l.    NEUROLOGIC: given patient's advanced dementia unable to test in detail however grossly nonfocal  PSYCHIATRIC:  patient is alert baseline dementia  SKIN: No obvious rash, lesion, or ulcer.   LABORATORY PANEL:  CBC Recent Labs  Lab 10/15/19 0351  WBC 13.5*  HGB 9.4*  HCT 28.3*  PLT 205    Chemistries  Recent Labs  Lab 10/15/19 0351  NA 137  K 3.5  CL 105  CO2 23  GLUCOSE 105*  BUN 12  CREATININE 0.97  CALCIUM 7.8*  MG 1.7  AST 14*  ALT 7  ALKPHOS 66  BILITOT 0.7   Cardiac Enzymes No results for input(s): TROPONINI in the last 168 hours. RADIOLOGY:  DG Chest Port 1 View  Result Date: 10/16/2019 CLINICAL DATA:  Fever EXAM: PORTABLE CHEST 1 VIEW COMPARISON:  10/13/2019 FINDINGS: Cardiomegaly, vascular congestion. Interstitial prominence may reflect interstitial edema. Bibasilar atelectasis. No visible effusions or acute bony abnormality. IMPRESSION: Suspect early interstitial edema. Bibasilar atelectasis. Electronically Signed   By: 10/15/2019 M.D.   On: 10/16/2019 11:49   ASSESSMENT AND PLAN:   HIEP OLLIS is a 74 y.o. 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  Unable to obtain history from patient due to altered mental status.  History obtained from wife at bedside.  Sepsis POA-- now resolved Suspect CAP (community acquired pneumonia)--  repeat chest x-ray negative for pneumonia  Acute respiratory failure with hypoxia (HCC)-- resolved. Sats are 93 to 96% on room air -Patient presents with AMS, O2 sat 85 with EMS, leukocytosis of 17,000 with lactic acid 2.5, bibasilar  atelectasis on chest x-ray with no other source of infection, febrile and tachycardic and hypotensive -Rocephin and azithromycin for suspected pneumonia-- change to PO Zithromax for three days -Sepsis fluids -Supplemental O2 to keep sats over 94% --breathing rx's as needed -- blood culture so far negative. Urine culture negative. --COVID negative -- white count 19.1 K-- 13.5 K -check BNP  Acute metabolic encephalopathy with underlying dementia -CT head with no acute findings and patient has no focal deficits -Fall and aspiration precautions -no neuro deficits -- appears at baseline    CAD (coronary artery disease) -Continue antiplatelets, statins and beta-blockers  Diastolic heart failure secondary to ischemic cardiomyopathy -Last EF 40 to 45% in 2018 -Monitor for fluid overload with IV hydration with treatment of sepsis -Daily weights -check BNP -chest x-ray from today showed mild interstitial edema. Patient has no respiratory distress sats are stable. Will consider Lasix IV PRN    Alzheimer's dementia without behavioral disturbance (HCC) -Continue galantamine and memantine    Ulcerative colitis (HCC) -Followed by GI.  No acute concerns -cont  Balsalazide    Appreciate palliative care consultation. Patient remains full code. Wife is considering DNR. She is receptive to outpatient palliative care   DVT prophylaxis: Lovenox  Code Status: full code  Family Communication: Wife at bedside Disposition Plan: Back to previous home environment Consults called: none  Status:At the time of admission, it appears that the appropriate admission status for this patient is INPATIENT. Patient requires inpatient status due to high intensity of service, high risk for further deterioration and high frequency of surveillance required.  Anticipate discharged tomorrow to home with home health PT if patient remains afebrile. This was discussed with patient's wife.   TOTAL TIME TAKING  CARE OF THIS PATIENT: *25* minutes.  >50% time spent on counselling and coordination of care  Note: This dictation was prepared with Dragon dictation along with smaller phrase technology. Any transcriptional errors that result from this process are unintentional.  Enedina Finner M.D    [facility]   CC: Primary care physician; [name], MDPatient ID: Lillia Dallas, male   DOB: [birth year], 74 y.o.   MRN: [id number]

## 2019-10-16 NOTE — Progress Notes (Signed)
OT Cancellation Note  Patient Details Name: Jeff Key MRN: [id number] DOB: March 14, 1946   Cancelled Treatment:    Reason Eval/Treat Not Completed: Other (comment). Consult received, chart reviewed.  Upon attempt, wife assisting pt with breakfast. Pt/wife briefly educated in role of acute OT. Pt appears easily distracted. Wife reports pt has "gone down hill a little" more recently. Wife agreeable to OT re-attempting at later time today after breakfast for evaluation.   Richrd Prime, MPH, MS, OTR/L ascom [phone] 10/16/19, 9:07 AM

## 2019-10-16 NOTE — Progress Notes (Signed)
PHARMACIST - PHYSICIAN COMMUNICATION DR:   Eliane Decree CONCERNING: Antibiotic IV to Oral Route Change Policy  RECOMMENDATION: This patient is receiving Azithromycin by the intravenous route.  Based on criteria approved by the Pharmacy and Therapeutics Committee, the antibiotic(s) is/are being converted to the equivalent oral dose form(s).   DESCRIPTION: These criteria include:  Patient being treated for a respiratory tract infection, urinary tract infection, cellulitis or clostridium difficile associated diarrhea if on metronidazole  The patient is not neutropenic and does not exhibit a GI malabsorption state  The patient is eating (either orally or via tube) and/or has been taking other orally administered medications for a least 24 hours  The patient is improving clinically and has a Tmax < 100.5  If you have questions about this conversion, please contact the Pharmacy Department   Albina Billet, PharmD, BCPS Clinical Pharmacist 10/16/2019 8:53 AM

## 2019-10-16 NOTE — Evaluation (Signed)
Physical Therapy Evaluation Patient Details Name: Jeff Key MRN: [id number] DOB: July 13, 1945 Today's Date: 10/16/2019   History of Present Illness   74 y.o. 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  Apparently he is much better today than the previous few days, but his baseline status is quite limited and wife is with him 24/7 and needs to assist with most mobility and ADLs.  He was able to ambulate ~50 ft holding wife's hand (struggled with unfamiliar walker and with PT HHA).  He certainly had some unsteadiness and poor safety awareness but wife present and assisting t/o the session and does acknowledge that it has been more difficult at home recently but children have been able to assist and she rightly feels that he would do poorly in an unfamiliar setting such as rehab.  Wife is confident that she will be able to manage him at home, she did report that HHPT has tried in the past with mixed results but this PT recommends to try again as pt is weaker than baseline and may benefit from introduction of AD (cane or walker) now before he is too weak and relying on her HHA is no longer tenible/safe.  Did discuss possibly getting paid help with some regularity may decrease burden on wife moving forward.  Follow Up Recommendations Home health PT (likely would struggle in rehab setting 2/2 mental status)    Equipment Recommendations  None recommended by PT (RW and SPC at home)    Recommendations for Other Services       Precautions / Restrictions Precautions Precautions: Fall Restrictions Weight Bearing Restrictions: No      Mobility  Bed Mobility Overal bed mobility: Needs Assistance Bed Mobility: Supine to Sit;Sit to Supine      Supine to sit: Mod assist Sit to supine: Mod assist   General bed mobility comments: Pt unable to initiate movement, needs direct cuing to do so.  Limited ability to follow direction but did show some effort once pt assisted to start transition  Transfers Overall transfer level: Needs assistance Equipment used: Rolling walker (2 wheeled) Transfers: Sit to/from Stand Sit to Stand: Min assist         General transfer comment: 2 sit to stand efforts during PT exam.  Each time needing repeated verbal and tactile cues to initiate transition to standing.  Again showed decent effort once cued enough to actually get up  Ambulation/Gait Ambulation/Gait assistance: Min assist Gait Distance (Feet): 50 Feet Assistive device: Rolling walker (2 wheeled);1 person hand held assist       General Gait Details: attempted to do some ambulation with walker (rarely used at baseline) with poor awareness, use, exercution and safety.  Then with PT giving HHA, again poor awareness and ability to follow instruction.  Once he had wife's hand (his familiar baseline) he did better though was still unsteady, halting and needing almost constant directional cuing ane encouragement simply to continue straight.  Pt was unable to sort out turning and attempting to return to room and ultimatley after standing still and blankly staring up and down the hall we sat in the recliner and rolled back to the room.    Stairs            Psychologist, prison and probation services  Modified Rankin (Stroke Patients Only)       Balance Overall balance assessment: Needs assistance Sitting-balance support: Single extremity supported Sitting balance-Leahy Scale: Fair Sitting balance - Comments: Pt leaning back to the R t/o much of time sitting, did improve somewhat with micro adjustements to EOB   Standing balance support: Single extremity supported Standing balance-Leahy Scale: Fair Standing balance comment: Pt with some unsteadiness, both  from physical factors but even more so secondary to confusion and likely disorientation                             Pertinent Vitals/Pain Pain Assessment: No/denies pain    Home Living Family/patient expects to be discharged to:: Private residence Living Arrangements: Spouse/significant other Available Help at Discharge: Available 24 hours/day;Family (3 daughter/sons-in-law that live locally) Type of Home: House Home Access: Stairs to enter   [employer] of Steps: 3 Home Layout: One level Home Equipment: Walker - 2 wheels;Cane - single point      Prior Function Level of Independence: Needs assistance   Gait / Transfers Assistance Needed: Pt can, at times walk w/o AD, however typically hold wife's hand and she needs to direct him  ADL's / Homemaking Assistance Needed: wife helps with essentially all ADLs 2/2 pt's dementia        Hand Dominance        Extremity/Trunk Assessment   Upper Extremity Assessment Upper Extremity Assessment: Difficult to assess due to impaired cognition    Lower Extremity Assessment Lower Extremity Assessment: Difficult to assess due to impaired cognition       Communication   Communication:  (very minimal and inconsistent verbalizations, no sentences)  Cognition Arousal/Alertness: Awake/alert Behavior During Therapy: Flat affect Overall Cognitive Status: History of cognitive impairments - at baseline                                 General Comments: wife reports he is much closer to his baseline today vs on admission      General Comments General comments (skin integrity, edema, etc.): Pt extremely confused t/o session, struggles to follow even very basic single step instructions; and when able certainly not consistently.    Exercises     Assessment/Plan    PT Assessment Patient needs continued PT services  PT Problem List Decreased strength;Decreased range of motion;Decreased activity  tolerance;Decreased balance;Decreased mobility;Decreased coordination;Decreased cognition;Decreased knowledge of use of DME;Decreased safety awareness;Decreased knowledge of precautions       PT Treatment Interventions DME instruction;Gait training;Stair training;Functional mobility training;Therapeutic activities;Therapeutic exercise;Balance training    PT Goals (Current goals can be found in the Care Plan section)  Acute Rehab PT Goals Patient Stated Goal: go home PT Goal Formulation: With family Time For Goal Achievement: 10/30/19 Potential to Achieve Goals: Fair    Frequency Min 2X/week   Barriers to discharge        Co-evaluation               AM-PAC PT "6 Clicks" Mobility  Outcome Measure Help needed turning from your back to your side while in a flat bed without using bedrails?: A Lot Help needed moving from lying on your back to sitting on the side of a flat bed without using bedrails?: A Lot Help needed moving to and from a bed to a chair (including a wheelchair)?: A Lot Help needed standing up from a  chair using your arms (e.g., wheelchair or bedside chair)?: A Lot Help needed to walk in hospital room?: A Lot Help needed climbing 3-5 steps with a railing? : A Lot 6 Click Score: 12    End of Session Equipment Utilized During Treatment: Gait belt Activity Tolerance:  (limited due to mental status)   Nurse Communication: Mobility status PT Visit Diagnosis: Unsteadiness on feet (R26.81);Muscle weakness (generalized) (M62.81);Difficulty in walking, not elsewhere classified (R26.2)    Time: 2297-9892 PT Time Calculation (min) (ACUTE ONLY): 34 min   Charges:   PT Evaluation $PT Eval Low Complexity: 1 Low PT Treatments $Gait Training: 8-22 mins $Therapeutic Activity: 8-22 mins        Malachi Pro, DPT 10/16/2019, 4:12 PM

## 2019-10-16 NOTE — Consult Note (Signed)
Consultation Note Date: 10/16/2019   Patient Name: Jeff Key  DOB: Apr 27, 1945  MRN: [id number]  Age / Sex: 74 y.o., male  PCP: Jeff Harrier, MD Referring Physician: Fritzi Mandes, MD  Reason for Consultation: Establishing goals of care  HPI/Patient Profile: 74 y.o. male  with past medical history of dementia, CAD, GERD, cardiomyopathy, and ulcerative colitis admitted on 10/13/2019 with AMS, fever, and hypoxia.  Being treated for suspected CAP. PMT consulted for [place].  Clinical Assessment and Goals of Care: I have reviewed medical records including EPIC notes, labs and imaging, received report from Dr. Posey Pronto, assessed the patient and then met with patient and wife, Jeff Key,  to discuss diagnosis prognosis, [place], EOL wishes, disposition and options.  I introduced Palliative Medicine as specialized medical care for people living with serious illness. It focuses on providing relief from the symptoms and stress of a serious illness. The goal is to improve quality of life for both the patient and the family.  We discussed a brief life review of the patient. Wife tells me patient is a "jack of all trades". He has had several different careers - worked for [employer] for a while. He was also a Investment banker, corporate at [employer]. They have been married 3 years and have 3 daughters that all live locally. They have multiple grandchildren and a great grandchild. Family all local and supportive.   As far as functional and nutritional status, Jeff Key tells me of a decline - specifically over the past 1-2 months. She tells me he is more dependent in ADLs. To ambulate, he requires extensive amount of support. He has lost most of his speech. He has maintained good appetite, no problems with swallowing. Wife tells me dementia was officially diagnosed about 3 years ago but she believes it was going on for a while before.     We discussed patient's current illness and what  it means in the larger context of patient's on-going co-morbidities.  Natural disease trajectory and expectations at EOL were discussed. Discuss progressive nature of dementia. Wife with good understanding.   I attempted to elicit values and goals of care important to the patient.  Wife tells me most important goal at this point is to keep Jeff Key at home - she is hopeful to avoid any SNF admissions. She feels this will lead to a rapid decline.   The difference between aggressive medical intervention and comfort care was considered in light of the patient's goals of care. We discussed when focusing more on comfort and avoid aggressive interventions may be more appropriate. Wife receptive to information.   Advance directives, concepts specific to code status, artificial feeding and hydration, and rehospitalization were considered and discussed. Encouraged wife to consider DNR/DNI status understanding evidenced based poor outcomes in similar hospitalized patients, as the cause of the arrest is likely associated with chronic/terminal disease rather than a reversible acute cardio-pulmonary event. Wife receptive to this. She does tell me she does not think Jeff Key would want to be on a ventilator. We discuss if he required CPR he would likely require ventilator support if he were to survive.   Discussed with Jeff Key the importance of continued conversation with family and the medical providers regarding overall plan of care and treatment options, ensuring decisions are within the context of the patient's values and GOCs.    Hospice and Palliative Care services outpatient were explained and offered. Discussed when hospice would be more appropriate for Jeff Key. Jeff Key is agreeable to outpatient  palliative care.   Hard choices booklet provided to [facility] for review.   Questions and concerns were addressed. The family was encouraged to call with questions or concerns.   Primary Decision  Maker NEXT OF KIN - wife Jeff Key    SUMMARY OF RECOMMENDATIONS   - wife receptive to initial goals of care conversation, education and written information provided about dementia disease process and future decisions to consider, DNR encouraged, hospice discussed - not quite ready (dementia FAST 7a) - more appropriate for  Hospice when he reaches FAST 7c - agreeable to outpatient palliative  Code Status/Advance Care Planning:  Full code - considering DNR  Prognosis:   Unable to determine  Discharge Planning: Home with Palliative Services      Primary Diagnoses: Present on Admission: . Sepsis ([place]) . CAD (coronary artery disease) . Alzheimer's dementia without behavioral disturbance ([place]) . Ischemic cardiomyopathy . Severe sepsis ([place])   I have reviewed the medical record, interviewed the patient and family, and examined the patient. The following aspects are pertinent.  Past Medical History:  Diagnosis Date  . Anemia ~ 11/2015  . CAD (coronary artery disease)    a. inf STEMI on 02/10/16. RCA treated with DES followed by staged PCI of LAD and Circumflex 02/14/16.  Marland Kitchen Dementia ([place]) dx'd 01/2016  . GERD (gastroesophageal reflux disease)   . History of stomach ulcers 1980s?  . Ischemic cardiomyopathy    a. 01/2016: EF 40-45%.    Social History   Socioeconomic History  . Marital status: Married    Spouse name: Not on file  . Number of children: Not on file  . Years of education: Not on file  . Highest education level: Not on file  Occupational History  . Not on file  Tobacco Use  . Smoking status: Former Smoker    Packs/day: 2.00    Years: 30.00    Pack years: 60.00    Types: Cigarettes    Quit date: 1990    Years since quitting: 31.5  . Smokeless tobacco: Never Used  Substance and Sexual Activity  . Alcohol use: No    Alcohol/week: 0.0 standard drinks  . Drug use: No  . Sexual activity: Never  Other Topics Concern  . Not on file  Social History Narrative   . Not on file   Social Determinants of Health   Financial Resource Strain:   . Difficulty of Paying Living Expenses:   Food Insecurity:   . Worried About Charity fundraiser in the Last Year:   . Arboriculturist in the Last Year:   Transportation Needs:   . Film/video editor (Medical):   Marland Kitchen Lack of Transportation (Non-Medical):   Physical Activity:   . Days of Exercise per Week:   . Minutes of Exercise per Session:   Stress:   . Feeling of Stress :   Social Connections:   . Frequency of Communication with Friends and Family:   . Frequency of Social Gatherings with Friends and Family:   . Attends Religious Services:   . Active Member of Clubs or Organizations:   . Attends Archivist Meetings:   Marland Kitchen Marital Status:    Family History  Problem Relation Age of Onset  . Prostate cancer Father   . Bladder Cancer Neg Hx   . Kidney cancer Neg Hx    Scheduled Meds: . aspirin  81 mg Oral Daily  . atorvastatin  20 mg Oral Daily  . [START ON 10/17/2019]  azithromycin  500 mg Oral Daily  . balsalazide  2,250 mg Oral TID  . clopidogrel  75 mg Oral Daily  . enoxaparin (LOVENOX) injection  40 mg Subcutaneous Q24H  . galantamine  12 mg Oral BID WC  . memantine  10 mg Oral BID  . metoprolol tartrate  25 mg Oral BID  . pantoprazole  40 mg Oral Daily   Continuous Infusions: . sodium chloride 75 mL/hr at 10/16/19 1409   PRN Meds:.acetaminophen **OR** acetaminophen, albuterol, ondansetron **OR** ondansetron (ZOFRAN) IV No Known Allergies Review of Systems  Unable to perform ROS: Dementia    Physical Exam Constitutional:      General: He is not in acute distress. Pulmonary:     Effort: Pulmonary effort is normal. No respiratory distress.     Comments: Cough present Skin:    General: Skin is warm and dry.  Neurological:     Mental Status: He is alert. He is disoriented.  Psychiatric:        Mood and Affect: Mood normal.     Vital Signs: BP 112/65   Pulse 62    Temp 99.2 F (37.3 C) (Oral)   Resp 18   Ht 5' 9"  (1.753 m)   Wt (!) 104.3 kg   SpO2 96%   BMI 33.97 kg/m  Pain Scale: 0-10   Pain Score: 0-No pain   SpO2: SpO2: 96 % O2 Device:SpO2: 96 % O2 Flow Rate: .   IO: Intake/output summary:   Intake/Output Summary (Last 24 hours) at 10/16/2019 1416 Last data filed at 10/16/2019 1409 Gross per 24 hour  Intake 4940 ml  Output 1600 ml  Net 3340 ml    LBM:   Baseline Weight: Weight: (!) 104.3 kg Most recent weight: Weight: (!) 104.3 kg     Palliative Assessment/Data: PPS 50%    Time Total: 70 minutes Greater than 50%  of this time was spent counseling and coordinating care related to the above assessment and plan.  Juel Burrow, DNP, AGNP-C Palliative Medicine Team [phone] Pager: [phone]

## 2019-10-16 NOTE — Progress Notes (Signed)
OT Cancellation Note  Patient Details Name: Jeff Key MRN: [id number] DOB: [birth year]   Cancelled Treatment:    Reason Eval/Treat Not Completed: Fatigue/lethargy limiting ability to participate. On 2nd attempt, pt fatigued. Recently worked with PT per chart. [name], agreeable to morning after breakfast to optimize pt's participation given fatigue this afternoon.   Richrd Prime, MPH, MS, OTR/L ascom [phone] 10/16/19, 4:22 PM

## 2019-10-17 DIAGNOSIS — I5023 Acute on chronic systolic (congestive) heart failure: Secondary | ICD-10-CM

## 2019-10-17 DIAGNOSIS — J9601 Acute respiratory failure with hypoxia: Secondary | ICD-10-CM

## 2019-10-17 MED ORDER — FUROSEMIDE 10 MG/ML IJ SOLN
20.0000 mg | Freq: Once | INTRAMUSCULAR | Status: AC
  Administered 2019-10-17: 20 mg via INTRAVENOUS
  Filled 2019-10-17: qty 4

## 2019-10-17 NOTE — Care Management Important Message (Signed)
Important Message  Patient Details  Name: Jeff Key MRN: [id number] Date of Birth: 28-May-1945   Medicare Important Message Given:  Yes     [name] 10/17/2019, 11:40 AM

## 2019-10-17 NOTE — Evaluation (Signed)
Occupational Therapy Evaluation Patient Details Name: Jeff Key MRN: [id number] DOB: Sep 11, 1945 Today's Date: 10/17/2019    History of Present Illness  74 y.o. 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 Prior to hospital admission and up until the past few weeks, the pt was able to move around the house with the spouse's assist (holding hands) and spouse was providing significant assist for all ADL 2/2 impaired motor planning/sequencing skills 2/2 dementia. Spouse present and involved during evaluation. Reports that their 3 daughters lives locally and visit/assist daily or every other day. Pt has 24/7 assist at home. Spouse endorses getting out of the house herself 1-2 times a week when family able to assist but does admit she could ask for help more. Encouraged her to rely on support system as needed to minimize risk of caregiver burnout and burden. Spouse verbalized understanding. Pt very fatigued during session. Currently pt demonstrates impairments as described below (See OT problem list) which functionally limit hisr ability to perform ADL/self-care tasks at baseline assist levels. Pt currently requires increased assist for all ADL and mobility tasks as compared to prior to admission. Pt/spouse educated in role of OT in the home. Pt would benefit from skilled OT to address noted impairments and functional limitations (see below for any additional details) in order to maximize safety and independence while minimizing falls risk and caregiver burden. Upon hospital discharge, recommend HHOT to maximize pt safety and return to functional independence during meaningful occupations of daily life. Suspect pt would NOT do well in a STR setting and would do  best in familiar environment with familiar family/supports.  Spouse verbalizes being open to trying. RNCM notified.     Follow Up Recommendations  Home health OT;Supervision/Assistance - 24 hour    Equipment Recommendations  None recommended by OT    Recommendations for Other Services       Precautions / Restrictions Precautions Precautions: Fall Restrictions Weight Bearing Restrictions: No      Mobility Bed Mobility               General bed mobility comments: deferred, pt lethargic  Transfers                 General transfer comment: deferred, pt lethargic    Balance                                           ADL either performed or assessed with clinical judgement   ADL Overall ADL's : Needs assistance/impaired                                       General ADL Comments: Pt requires max assist for all ADL tasks, cues for sequencing; spouse able to provide needed level of assist     Vision   Additional Comments: difficult to determine 2/2 cognitive impairment     Perception     Praxis      Pertinent Vitals/Pain Pain Assessment: Faces Faces Pain Scale: No hurt     Hand Dominance     Extremity/Trunk  Assessment Upper Extremity Assessment Upper Extremity Assessment: Difficult to assess due to impaired cognition   Lower Extremity Assessment Lower Extremity Assessment: Difficult to assess due to impaired cognition       Communication Communication Communication: Receptive difficulties;Expressive difficulties (very minimal and inconsistent verbalizations, no sentences, per spouse this has been his baseline for a few months)   Cognition Arousal/Alertness: Lethargic Behavior During Therapy: Flat affect Overall Cognitive Status: History of cognitive impairments - at baseline                                 General Comments: Pt very sleepy, requires assist for all sequencing/motor planning,  responds well with spouse's assist versus therapist/staff   General Comments       Exercises Other Exercises Other Exercises: caregiver education/training in strategies to support maximal safety for pt/spouse during ADL and mobility tasks Other Exercises: caregiver educated in role of home health OT   Shoulder Instructions      Home Living Family/patient expects to be discharged to:: Private residence Living Arrangements: Spouse/significant other Available Help at Discharge: Available 24 hours/day;Family (3 daughter/sons-in-law that live locally) Type of Home: House Home Access: Stairs to enter [employer] of Steps: 3   Home Layout: One level         Bathroom Toilet: Handicapped height Bathroom Accessibility: Yes   Home Equipment: Walker - 2 wheels;Cane - single point          Prior Functioning/Environment Level of Independence: Needs assistance  Gait / Transfers Assistance Needed: Pt can, at times walk w/o AD, however typically hold wife's hand and she needs to direct him ADL's / Homemaking Assistance Needed: wife helps with essentially all ADLs 2/2 pt's dementia, significant assist with all ADL, cues and assist for sequencing            OT Problem List: Decreased cognition;Decreased safety awareness;Decreased activity tolerance      OT Treatment/Interventions: Self-care/ADL training;Therapeutic exercise;Therapeutic activities;Cognitive remediation/compensation;DME and/or AE instruction;Patient/family education;Balance training;Energy conservation    OT Goals(Current goals can be found in the care plan section) Acute Rehab OT Goals Patient Stated Goal: go home OT Goal Formulation: With patient/family Time For Goal Achievement: 10/31/19 Potential to Achieve Goals: Good ADL Goals Additional ADL Goal #1: Pt will perform dressing task with caregiver independently assisting as needed. Additional ADL Goal #2: Pt will perform toileting task with caregiver  independently assisting. Additional ADL Goal #3: Pt will perform ADL transfers with caregiver independently assisting.  OT Frequency: Min 1X/week   Barriers to D/C:            Co-evaluation              AM-PAC OT "6 Clicks" Daily Activity     Outcome Measure Help from another person eating meals?: A Lot Help from another person taking care of personal grooming?: A Lot Help from another person toileting, which includes using toliet, bedpan, or urinal?: A Lot Help from another person bathing (including washing, rinsing, drying)?: A Lot Help from another person to put on and taking off regular upper body clothing?: A Lot Help from another person to put on and taking off regular lower body clothing?: A Lot 6 Click Score: 12   End of Session    Activity Tolerance: Patient limited by fatigue Patient left: in bed;with call bell/phone within reach;with bed alarm set;with nursing/sitter in room;with family/visitor present  OT Visit Diagnosis: Other abnormalities of gait  and mobility (R26.89);Other symptoms and signs involving cognitive function                Time: 0998-3382 OT Time Calculation (min): 25 min Charges:  OT General Charges $OT Visit: 1 Visit OT Evaluation $OT Eval Moderate Complexity: 1 Mod OT Treatments $Therapeutic Activity: 8-22 mins  Richrd Prime, MPH, MS, OTR/L ascom [phone] 10/17/19, 11:26 AM

## 2019-10-17 NOTE — Discharge Summary (Signed)
[facility] [place] at [facility]   PATIENT NAME: Jeff Key    MR#:  [id number]  DATE OF BIRTH:  [birth year]  DATE OF ADMISSION:  10/13/2019 ADMITTING PHYSICIAN: Dorcas Carrow, MD  DATE OF DISCHARGE: 10/17/2019  PRIMARY CARE PHYSICIAN: [name], MD    ADMISSION DIAGNOSIS:  Hypoxia [R09.02] Sepsis (HCC) [A41.9] Severe sepsis (HCC) [A41.9, R65.20] Fever, unspecified fever cause [R50.9] Altered mental status, unspecified altered mental status type [R41.82] Sepsis, due to unspecified organism, unspecified whether acute organ dysfunction present (HCC) [A41.9]  DISCHARGE DIAGNOSIS:  sepsis on admission-- resolved etiology unclear, possible viral illness versus bronchitis acute hypoxic respiratory failure on admission resolved  acute on chronic systolic heart failure-- mild resolved  SECONDARY DIAGNOSIS:   Past Medical History:  Diagnosis Date  . Anemia ~ 11/2015  . CAD (coronary artery disease)    a. inf STEMI on 02/10/16. RCA treated with DES followed by staged PCI of LAD and Circumflex 02/14/16.  Marland Kitchen Dementia (HCC) dx'd 01/2016  . GERD (gastroesophageal reflux disease)   . History of stomach ulcers 1980s?  . Ischemic cardiomyopathy    a. 01/2016: EF 40-45%.     HOSPITAL COURSE:   Jeff Key a 74 y.o.malewith medical history significant forCAD, dementia, diastolic heart failure secondary to ischemic cardiomyopathy, last EF 40 to 45% 2018, ulcerative colitis who was brought into the emergency room with altered mental status, with EMS reported an O2 sat 85% on room air when lying flat, 92% when sitting up. Unable to obtain history from patient due to altered mental status.History obtained from wife at bedside.  Sepsis POA-- now resolved SuspectCAP (community acquired pneumonia)-- repeat chest x-ray negative for pneumonia Acute respiratory failure with hypoxia (HCC)-- resolved. Sats are 93 to 96% on room air -Patient presents with  AMS, O2 sat 85 with EMS, leukocytosis of 17,000 with lactic acid 2.5, bibasilar atelectasis on chest x-ray with no other source of infection, febrile and tachycardicand hypotensive -Rocephin and azithromycin for suspected pneumonia-- change to PO Zithromax for three days (complted course) -Sepsis fluids -Supplemental O2 to keep sats over 94% --breathing rx's as needed -- blood culture so far negative. Urine culture negative. --COVID negative -- white count 19.1 K-- 13.5 K -check BNP  Acute metabolic encephalopathy with underlying dementia -CT head with no acute findings and patient has no focal deficits -Fall and aspiration precautions -no neuro deficits -- appears at baseline  CAD (coronary artery disease) -Continue antiplatelets, statins and beta-blockers  Systolic heart failure secondary to ischemic cardiomyopathy acute on chornic mild -Last EF 40 to 45% in 2018 -Monitor for fluid overload with IV hydration with treatment of sepsis -Daily weights -check BNP--321 gave IV lasix x1--sats >92% on RA. -chest x-ray from today showed mild interstitial edema. Patient has no respiratory distress sats are stable.   Alzheimer's dementia without behavioral disturbance (HCC) -Continue galantamine and memantine  Ulcerative colitis (HCC) -Followed by GI. No acute concerns -cont  Balsalazide   Appreciate palliative care consultation. Patient remains full code. Wife is considering DNR. She is receptive to outpatient palliative care   DVT prophylaxis:Lovenox  Code Status:full code Family Communication:Wife at bedside Disposition Plan:Back to previous home environment Consults called:none Status:At the time of admission, it appears that the appropriate admission status for this patient is INPATIENT. Discharge to home with home health PT with outpatient palliative care services CONSULTS OBTAINED:    DRUG ALLERGIES:  No Known Allergies  DISCHARGE MEDICATIONS:    Allergies as of 10/17/2019  No Known Allergies     Medication List    STOP taking these medications   sulfaSALAzine 500 MG tablet Commonly known as: AZULFIDINE     TAKE these medications   aspirin EC 81 MG tablet Take 81 mg by mouth daily.   atorvastatin 20 MG tablet Commonly known as: LIPITOR Take 1 tablet (20 mg total) by mouth daily.   balsalazide 750 MG capsule Commonly known as: COLAZAL Take 2,250 mg by mouth 3 (three) times daily.   clopidogrel 75 MG tablet Commonly known as: PLAVIX Take 1 tablet (75 mg total) by mouth daily.   escitalopram 5 MG tablet Commonly known as: LEXAPRO Take 10 mg by mouth daily.   ferrous sulfate 325 (65 FE) MG tablet Take 325 mg by mouth daily with breakfast.   galantamine 12 MG tablet Commonly known as: RAZADYNE Take 12 mg by mouth 2 (two) times daily with a meal.   lisinopril 10 MG tablet Commonly known as: ZESTRIL Take 1 tablet by mouth once daily   memantine 10 MG tablet Commonly known as: NAMENDA Take 10 mg by mouth 2 (two) times daily.   metoprolol tartrate 25 MG tablet Commonly known as: LOPRESSOR Take 1 tablet (25 mg total) by mouth 2 (two) times daily.   nitroGLYCERIN 0.4 MG SL tablet Commonly known as: Nitrostat Place 1 tablet (0.4 mg total) under the tongue every 5 (five) minutes as needed for chest pain.   omeprazole 20 MG capsule Commonly known as: PRILOSEC Take 20 mg by mouth daily.   vitamin B-12 1000 MCG tablet Commonly known as: CYANOCOBALAMIN Take 1,000 mcg by mouth daily.       If you experience worsening of your admission symptoms, develop shortness of breath, life threatening emergency, suicidal or homicidal thoughts you must seek medical attention immediately by calling 911 or calling your MD immediately  if symptoms less severe.  You Must read complete instructions/literature along with all the possible adverse reactions/side effects for all the Medicines you take and that have been  prescribed to you. Take any new Medicines after you have completely understood and accept all the possible adverse reactions/side effects.   Please note  You were cared for by a hospitalist during your hospital stay. If you have any questions about your discharge medications or the care you received while you were in the hospital after you are discharged, you can call the unit and asked to speak with the hospitalist on call if the hospitalist that took care of you is not available. Once you are discharged, your primary care physician will handle any further medical issues. Please note that NO REFILLS for any discharge medications will be authorized once you are discharged, as it is imperative that you return to your primary care physician (or establish a relationship with a primary care physician if you do not have one) for your aftercare needs so that they can reassess your need for medications and monitor your lab values. Today   SUBJECTIVE   No new issues per RN. Remains confused at baseline. Wife in the room. Remains afebrile.  VITAL SIGNS:  Blood pressure 108/70, pulse 60, temperature 98 F (36.7 C), temperature source Axillary, resp. rate 16, height 5\' 9"  (1.753 m), weight (!) 104.3 kg, SpO2 96 %.  I/O:    Intake/Output Summary (Last 24 hours) at 10/17/2019 1219 Last data filed at 10/17/2019 0951 Gross per 24 hour  Intake 1983.95 ml  Output 800 ml  Net 1183.95 ml    PHYSICAL  EXAMINATION:  GENERAL:  74 y.o.-year-old patient lying in the bed with no acute distress.  EYES: Pupils equal, round, reactive to light and accommodation. No scleral icterus.  HEENT: Head atraumatic, normocephalic. Oropharynx and nasopharynx clear.  NECK:  Supple, no jugular venous distention. No thyroid enlargement, no tenderness.  LUNGS: Normal breath sounds bilaterally, no wheezing, rales, scattered rhonchi + no crepitation. No use of accessory muscles of respiration.  CARDIOVASCULAR: S1, S2 normal. No  murmurs, rubs, or gallops.  ABDOMEN: Soft, non-tender, non-distended. Bowel sounds present. No organomegaly or mass.  EXTREMITIES: No pedal edema, cyanosis, or clubbing.  NEUROLOGIC: Cranial nerves II through XII are intact. Muscle strength 5/5 in all extremities. Sensation intact. Gait not checked.  PSYCHIATRIC: The patient is alert and awake--has dementia at baseline SKIN: No obvious rash, lesion, or ulcer.   DATA REVIEW:   CBC  Recent Labs  Lab 10/15/19 0351  WBC 13.5*  HGB 9.4*  HCT 28.3*  PLT 205    Chemistries  Recent Labs  Lab 10/15/19 0351  NA 137  K 3.5  CL 105  CO2 23  GLUCOSE 105*  BUN 12  CREATININE 0.97  CALCIUM 7.8*  MG 1.7  AST 14*  ALT 7  ALKPHOS 66  BILITOT 0.7    Microbiology Results   Recent Results (from the past 240 hour(s))  Blood Culture (routine x 2)     Status: None (Preliminary result)   Collection Time: 10/13/19 10:04 PM   Specimen: BLOOD  Result Value Ref Range Status   Specimen Description BLOOD LEFT HAND  Final   Special Requests BOTTLES DRAWN AEROBIC AND ANAEROBIC BCAV  Final   Culture   Final    NO GROWTH 4 DAYS Performed at [facility], [address], [place], [place] [address]    Report Status PENDING  Incomplete  Blood Culture (routine x 2)     Status: None (Preliminary result)   Collection Time: 10/13/19 10:04 PM   Specimen: BLOOD  Result Value Ref Range Status   Specimen Description BLOOD RIGHT HAND  Final   Special Requests BOTTLES DRAWN AEROBIC AND ANAEROBIC BCAV  Final   Culture   Final    NO GROWTH 4 DAYS Performed at [facility], [address], [place], [place] [address]    Report Status PENDING  Incomplete  SARS Coronavirus 2 by RT PCR (hospital order, performed in [facility] hospital lab) Nasopharyngeal Nasopharyngeal Swab     Status: None   Collection Time: 10/13/19 10:04 PM   Specimen: Nasopharyngeal Swab  Result Value Ref Range Status   SARS Coronavirus 2 NEGATIVE NEGATIVE Final     Comment: (NOTE) SARS-CoV-2 target nucleic acids are NOT DETECTED.  The SARS-CoV-2 RNA is generally detectable in upper and lower respiratory specimens during the acute phase of infection. The lowest concentration of SARS-CoV-2 viral copies this assay can detect is 250 copies / mL. A negative result does not preclude SARS-CoV-2 infection and should not be used as the sole basis for treatment or other patient management decisions.  A negative result may occur with improper specimen collection / handling, submission of specimen other than nasopharyngeal swab, presence of viral mutation(s) within the areas targeted by this assay, and inadequate number of viral copies (<250 copies / mL). A negative result must be combined with clinical observations, patient history, and epidemiological information.  Fact Sheet for Patients:   BoilerBrush.com.cy  Fact Sheet for Healthcare Providers: https://pope.com/  This test is not yet approved or  cleared by the [place] and has been authorized for detection and/or diagnosis of SARS-CoV-2 by FDA under an Emergency Use Authorization (EUA).  This EUA will remain in effect (meaning this test can be used) for the duration of the COVID-19 declaration under Section 564(b)(1) of the Act, 21 U.S.C. section 360bbb-3(b)(1), unless the authorization is terminated or revoked sooner.  Performed at [facility], [address] [place], [place] [address]   Urine culture     Status: None   Collection Time: 10/14/19  1:03 AM   Specimen: In/Out Cath Urine  Result Value Ref Range Status   Specimen Description   Final    IN/OUT CATH URINE Performed at [facility], [address], [place], [place] [address]    Special Requests   Final    NONE Performed at [facility], [address], [place], [place] [address]    Culture   Final    NO GROWTH Performed at [facility], [address], [place], [place] [address]    Report Status 10/15/2019 FINAL  Final    RADIOLOGY:  DG Chest Port 1 View  Result Date: 10/16/2019 CLINICAL DATA:  Fever EXAM: PORTABLE CHEST 1 VIEW COMPARISON:  10/13/2019 FINDINGS: Cardiomegaly, vascular congestion. Interstitial prominence may reflect interstitial edema. Bibasilar atelectasis. No visible effusions or acute bony abnormality. IMPRESSION: Suspect early interstitial edema. Bibasilar atelectasis. Electronically Signed   By: Charlett Nose M.D.   On: 10/16/2019 11:49     CODE STATUS:     Code Status Orders  (From admission, onward)         Start     Ordered   10/14/19 0146  Full code  Continuous        10/14/19 0148        Code Status History    Date Active Date Inactive Code Status Order ID Comments User Context   03/10/2016 1439 03/13/2016 1318 Full Code [id number]  Adrian Saran, MD ED   02/10/2016 1433 02/15/2016 1522 Full Code [id number]  Runell Gess, MD Inpatient   11/29/2015 0337 11/30/2015 1617 Full Code [id number]  Arnaldo Natal, MD Inpatient   Advance Care Planning Activity    Advance Directive Documentation     Most Recent Value  Type of Advance Directive Healthcare Power of Attorney  Pre-existing out of facility DNR order (yellow form or pink MOST form) --  "MOST" Form in Place? --       TOTAL TIME TAKING CARE OF THIS PATIENT: *35* minutes.    Enedina Finner M.D  [facility]    CC: Primary care physician; [name], MD

## 2019-10-17 NOTE — Care Management (Signed)
Patient to discharge home today. Wife to transport.  Home health orders for PT and OT.   Discussed with wife. She would like not to set up home health services at this time, and follow up PCP if she decides she feels like it is indicated.   Bedside RN and MD updated

## 2019-10-17 NOTE — Plan of Care (Signed)
Discharge order received. Patient mental status is at baseline. Vital signs stable . No signs of acute distress. Discharge instructions givent o wife. Patient's wife  verbalized understanding. No other issues noted at this time.

## 2019-10-18 LAB — CULTURE, BLOOD (ROUTINE X 2)
Culture: NO GROWTH
Culture: NO GROWTH

## 2019-10-23 ENCOUNTER — Telehealth: Payer: Self-pay | Admitting: Adult Health Nurse Practitioner

## 2019-10-23 NOTE — Telephone Encounter (Signed)
Called patient to offer to schedule a Palliative Consult, no answer - left message with reason for call along with my name and contact number 

## 2019-10-24 DIAGNOSIS — Z8719 Personal history of other diseases of the digestive system: Secondary | ICD-10-CM | POA: Diagnosis not present

## 2019-10-24 DIAGNOSIS — F0280 Dementia in other diseases classified elsewhere without behavioral disturbance: Secondary | ICD-10-CM | POA: Diagnosis not present

## 2019-10-24 DIAGNOSIS — Z7901 Long term (current) use of anticoagulants: Secondary | ICD-10-CM | POA: Diagnosis not present

## 2019-10-24 DIAGNOSIS — K2100 Gastro-esophageal reflux disease with esophagitis, without bleeding: Secondary | ICD-10-CM | POA: Diagnosis not present

## 2019-10-24 DIAGNOSIS — G309 Alzheimer's disease, unspecified: Secondary | ICD-10-CM | POA: Diagnosis not present

## 2019-10-24 DIAGNOSIS — F0150 Vascular dementia without behavioral disturbance: Secondary | ICD-10-CM | POA: Diagnosis not present

## 2019-10-24 DIAGNOSIS — K625 Hemorrhage of anus and rectum: Secondary | ICD-10-CM | POA: Diagnosis not present

## 2019-10-27 DIAGNOSIS — F0150 Vascular dementia without behavioral disturbance: Secondary | ICD-10-CM | POA: Diagnosis not present

## 2019-10-27 DIAGNOSIS — I2510 Atherosclerotic heart disease of native coronary artery without angina pectoris: Secondary | ICD-10-CM | POA: Diagnosis not present

## 2019-10-27 DIAGNOSIS — G309 Alzheimer's disease, unspecified: Secondary | ICD-10-CM | POA: Diagnosis not present

## 2019-10-27 DIAGNOSIS — E538 Deficiency of other specified B group vitamins: Secondary | ICD-10-CM | POA: Diagnosis not present

## 2019-10-27 DIAGNOSIS — D649 Anemia, unspecified: Secondary | ICD-10-CM | POA: Diagnosis not present

## 2019-10-27 DIAGNOSIS — F0280 Dementia in other diseases classified elsewhere without behavioral disturbance: Secondary | ICD-10-CM | POA: Diagnosis not present

## 2019-10-27 DIAGNOSIS — Z0000 Encounter for general adult medical examination without abnormal findings: Secondary | ICD-10-CM | POA: Diagnosis not present

## 2019-10-27 DIAGNOSIS — K51311 Ulcerative (chronic) rectosigmoiditis with rectal bleeding: Secondary | ICD-10-CM | POA: Diagnosis not present

## 2019-10-27 DIAGNOSIS — Z09 Encounter for follow-up examination after completed treatment for conditions other than malignant neoplasm: Secondary | ICD-10-CM | POA: Diagnosis not present

## 2019-10-27 DIAGNOSIS — R7309 Other abnormal glucose: Secondary | ICD-10-CM | POA: Diagnosis not present

## 2019-10-27 DIAGNOSIS — K5190 Ulcerative colitis, unspecified, without complications: Secondary | ICD-10-CM | POA: Diagnosis not present

## 2019-10-28 DIAGNOSIS — R7309 Other abnormal glucose: Secondary | ICD-10-CM | POA: Diagnosis not present

## 2019-10-28 DIAGNOSIS — G309 Alzheimer's disease, unspecified: Secondary | ICD-10-CM | POA: Diagnosis not present

## 2019-10-28 DIAGNOSIS — K51311 Ulcerative (chronic) rectosigmoiditis with rectal bleeding: Secondary | ICD-10-CM | POA: Diagnosis not present

## 2019-10-28 DIAGNOSIS — F0280 Dementia in other diseases classified elsewhere without behavioral disturbance: Secondary | ICD-10-CM | POA: Diagnosis not present

## 2019-10-28 DIAGNOSIS — I2510 Atherosclerotic heart disease of native coronary artery without angina pectoris: Secondary | ICD-10-CM | POA: Diagnosis not present

## 2019-10-28 DIAGNOSIS — Z0000 Encounter for general adult medical examination without abnormal findings: Secondary | ICD-10-CM | POA: Diagnosis not present

## 2019-10-28 DIAGNOSIS — F0150 Vascular dementia without behavioral disturbance: Secondary | ICD-10-CM | POA: Diagnosis not present

## 2019-11-05 DIAGNOSIS — R413 Other amnesia: Secondary | ICD-10-CM | POA: Diagnosis not present

## 2019-11-05 DIAGNOSIS — K51311 Ulcerative (chronic) rectosigmoiditis with rectal bleeding: Secondary | ICD-10-CM | POA: Diagnosis not present

## 2019-11-05 DIAGNOSIS — F0150 Vascular dementia without behavioral disturbance: Secondary | ICD-10-CM | POA: Diagnosis not present

## 2019-11-05 DIAGNOSIS — G309 Alzheimer's disease, unspecified: Secondary | ICD-10-CM | POA: Diagnosis not present

## 2019-11-05 DIAGNOSIS — F0280 Dementia in other diseases classified elsewhere without behavioral disturbance: Secondary | ICD-10-CM | POA: Diagnosis not present

## 2019-11-05 DIAGNOSIS — Z0000 Encounter for general adult medical examination without abnormal findings: Secondary | ICD-10-CM | POA: Diagnosis not present

## 2019-11-05 DIAGNOSIS — I2510 Atherosclerotic heart disease of native coronary artery without angina pectoris: Secondary | ICD-10-CM | POA: Diagnosis not present

## 2019-11-05 DIAGNOSIS — E538 Deficiency of other specified B group vitamins: Secondary | ICD-10-CM | POA: Diagnosis not present

## 2019-11-05 DIAGNOSIS — H6123 Impacted cerumen, bilateral: Secondary | ICD-10-CM | POA: Diagnosis not present

## 2019-11-05 DIAGNOSIS — D649 Anemia, unspecified: Secondary | ICD-10-CM | POA: Diagnosis not present

## 2019-11-19 ENCOUNTER — Ambulatory Visit: Payer: PPO | Admitting: Cardiovascular Disease

## 2019-11-27 ENCOUNTER — Telehealth: Payer: Self-pay | Admitting: Adult Health Nurse Practitioner

## 2019-11-27 NOTE — Telephone Encounter (Signed)
Called patient to schedule the Palliative Consult, no answer - unable to leave a message due to no voicemail set up.  I then called patient's wife's cell, no answer - left message with reason for call along with my name and contact number, requesting a return call to schedule visit.

## 2019-12-01 ENCOUNTER — Ambulatory Visit: Payer: PPO | Admitting: General Practice

## 2019-12-01 DIAGNOSIS — G309 Alzheimer's disease, unspecified: Secondary | ICD-10-CM | POA: Diagnosis not present

## 2019-12-01 DIAGNOSIS — K625 Hemorrhage of anus and rectum: Secondary | ICD-10-CM | POA: Diagnosis not present

## 2019-12-01 DIAGNOSIS — F0280 Dementia in other diseases classified elsewhere without behavioral disturbance: Secondary | ICD-10-CM | POA: Diagnosis not present

## 2019-12-01 DIAGNOSIS — E538 Deficiency of other specified B group vitamins: Secondary | ICD-10-CM | POA: Diagnosis not present

## 2019-12-01 DIAGNOSIS — F0150 Vascular dementia without behavioral disturbance: Secondary | ICD-10-CM | POA: Diagnosis not present

## 2019-12-01 DIAGNOSIS — Z8719 Personal history of other diseases of the digestive system: Secondary | ICD-10-CM | POA: Diagnosis not present

## 2019-12-01 DIAGNOSIS — D649 Anemia, unspecified: Secondary | ICD-10-CM | POA: Diagnosis not present

## 2019-12-01 DIAGNOSIS — Z0000 Encounter for general adult medical examination without abnormal findings: Secondary | ICD-10-CM | POA: Diagnosis not present

## 2019-12-01 DIAGNOSIS — I2510 Atherosclerotic heart disease of native coronary artery without angina pectoris: Secondary | ICD-10-CM | POA: Diagnosis not present

## 2019-12-01 DIAGNOSIS — K51311 Ulcerative (chronic) rectosigmoiditis with rectal bleeding: Secondary | ICD-10-CM | POA: Diagnosis not present

## 2019-12-01 DIAGNOSIS — R413 Other amnesia: Secondary | ICD-10-CM | POA: Diagnosis not present

## 2019-12-08 ENCOUNTER — Ambulatory Visit: Payer: PPO | Admitting: Physician Assistant

## 2019-12-11 ENCOUNTER — Ambulatory Visit: Payer: PPO | Admitting: Physician Assistant

## 2019-12-16 ENCOUNTER — Other Ambulatory Visit: Payer: Self-pay | Admitting: Physician Assistant

## 2019-12-16 DIAGNOSIS — M25561 Pain in right knee: Secondary | ICD-10-CM | POA: Diagnosis not present

## 2019-12-16 DIAGNOSIS — S8261XA Displaced fracture of lateral malleolus of right fibula, initial encounter for closed fracture: Secondary | ICD-10-CM

## 2019-12-19 ENCOUNTER — Other Ambulatory Visit: Payer: Self-pay

## 2019-12-19 ENCOUNTER — Ambulatory Visit
Admission: RE | Admit: 2019-12-19 | Discharge: 2019-12-19 | Disposition: A | Discharge status: Home / Self Care | Payer: PPO | Source: Ambulatory Visit | Attending: Physician Assistant | Admitting: Physician Assistant

## 2019-12-19 DIAGNOSIS — S82451A Displaced comminuted fracture of shaft of right fibula, initial encounter for closed fracture: Secondary | ICD-10-CM | POA: Diagnosis not present

## 2019-12-19 DIAGNOSIS — S8261XA Displaced fracture of lateral malleolus of right fibula, initial encounter for closed fracture: Secondary | ICD-10-CM | POA: Diagnosis not present

## 2019-12-19 DIAGNOSIS — S82831A Other fracture of upper and lower end of right fibula, initial encounter for closed fracture: Secondary | ICD-10-CM | POA: Diagnosis not present

## 2019-12-19 DIAGNOSIS — I739 Peripheral vascular disease, unspecified: Secondary | ICD-10-CM | POA: Diagnosis not present

## 2019-12-23 DIAGNOSIS — S8261XA Displaced fracture of lateral malleolus of right fibula, initial encounter for closed fracture: Secondary | ICD-10-CM | POA: Diagnosis not present

## 2019-12-31 ENCOUNTER — Telehealth: Payer: Self-pay | Admitting: Adult Health Nurse Practitioner

## 2019-12-31 NOTE — Telephone Encounter (Signed)
Called patient's home number to offer to schedule a Palliative Consult, no answer - left message with reason for call along with my name and call back number, requesting a return call to let me know if they wish to pursue Palliative services or not.  I also called wife's cell number with no answer - left message with above information.  I then called and spoke with patient's daughter, Jeff Key, and explained to her why I was calling and I explained what Palliative services were to her.  Daughter said that she is going to discuss this with her sister and they will try to talk with their Mom about this and she took my name and number and either she will call me back or have her Mom to call me.

## 2020-01-01 ENCOUNTER — Telehealth: Payer: Self-pay | Admitting: Adult Health Nurse Practitioner

## 2020-01-01 NOTE — Telephone Encounter (Signed)
Scheduled [facility] Palliative visit for 01-15-20 at 1:00.

## 2020-01-15 ENCOUNTER — Other Ambulatory Visit: Payer: Self-pay

## 2020-01-15 ENCOUNTER — Other Ambulatory Visit: Payer: PPO | Admitting: Adult Health Nurse Practitioner

## 2020-01-15 DIAGNOSIS — Z515 Encounter for palliative care: Secondary | ICD-10-CM

## 2020-01-15 DIAGNOSIS — G300 Alzheimer's disease with early onset: Secondary | ICD-10-CM

## 2020-01-15 DIAGNOSIS — F0280 Dementia in other diseases classified elsewhere without behavioral disturbance: Secondary | ICD-10-CM | POA: Diagnosis not present

## 2020-01-15 NOTE — Progress Notes (Signed)
Therapist, nutritional Palliative Care Consult Note Telephone: [phone]  Fax: [phone]  PATIENT NAME: Jeff Key DOB: 1945-07-27 MRN: [id number]  PRIMARY CARE PROVIDER:   [name], MD  REFERRING PROVIDER:  [name], MD [address] [facility] [place],  [place] [address]  RESPONSIBLE PARTY:   Hartley Urton, wife [phone]    RECOMMENDATIONS and PLAN:  1.  Advanced care planning.  Patient has living will.  Have encouraged wife to make sure provider has a copy.  Briefly went over MOST form and left with family to go over together.   2.  Functional status.  Patient is able to walk with assistance.  Has walker but does not use it.  Requires assistance with ADLs.  Is incontinent of B&B.  Appetite is good with no reported weight loss.  Wife states that he has worked with PT before but does not remember the instruction due to his dementia.  Does have frequent falls, see below.  Continue supportive care at home.  3. Support.  Wife is primary caregiver.  Family helps with care.  Wife does state that it is hard caring for him at times. States that it is harder in the mornings and evening when providing AM and PM care.  Discussed in home help and gave list of agencies.  Provided [employer] information as a source for resource help.  Goal is to keep him in the home and add in home help when needed.   I spent 60 minutes providing this consultation,  from 1:00 to 2:00 including time spent with patient/family, chart review, provider coordination, documentation. More than 50% of the time in this consultation was spent coordinating communication.   HISTORY OF PRESENT ILLNESS:  Jeff Key is a 74 y.o. year old male with multiple medical problems including mixed Alzheimer's and vascular dementia, CAD, ulcerative colitis, anemia, ischemic cardiomyopathy. Palliative Care was asked to help address goals of care. Patient had fall  on 08/18/19 and was treated in ER for head laceration.  He had fall on 09/02/19 and was treated in ER for left arm laceration and found to have UTI.  Was in hospital 7/26-7/30/21 for sepsis on unknown origin with acute respiratory failure and CHF exacerbation.  Wife states that about 4 weeks ago he fell in bathroom and fractured right ankle.  He has follow up with ortho next week to reevaluate fracture.  Wife states that he has been getting up and walking less for about 2 months and even more so since the right ankle fracture. Wife does state that he has been moving better over the past week.  Family states that he sleeps about 75% of the day.  He does not speak much and sometimes does not make sense with what he is saying.  He has been having some increased SOB with activity since he has been less active.  Will have occasional headache relieved with Tylenol.  Denies pain today.  No reports of cough, fever, N/V/D, constipation, changes in urinary habits.    CODE STATUS: see above  PPS: 40% HOSPICE ELIGIBILITY/DIAGNOSIS: TBD  PHYSICAL EXAM:  BP 128/72 HR 64 O2 98% on RA General: patient sitting in chair in NAD Cardiovascular: regular rate and rhythm Pulmonary: lung sounds clear; normal respiratory effort Abdomen: soft, nontender, + bowel sounds Extremities: no edema, no joint deformities Skin: no rashes on exposed skin Neurological: Weakness; A&O to person and place  PAST MEDICAL HISTORY:  Past Medical History:  Diagnosis Date  . Anemia ~ 11/2015  . CAD (coronary artery disease)    a. inf STEMI on 02/10/16. RCA treated with DES followed by staged PCI of LAD and Circumflex 02/14/16.  Marland Kitchen Dementia (HCC) dx'd 01/2016  . GERD (gastroesophageal reflux disease)   . History of stomach ulcers 1980s?  . Ischemic cardiomyopathy    a. 01/2016: EF 40-45%.     SOCIAL HX:  Social History   Tobacco Use  . Smoking status: Former Smoker    Packs/day: 2.00    Years: 30.00    Pack years: 60.00    Types:  Cigarettes    Quit date: 1990    Years since quitting: 31.8  . Smokeless tobacco: Never Used  Substance Use Topics  . Alcohol use: No    Alcohol/week: 0.0 standard drinks    ALLERGIES: No Known Allergies   PERTINENT MEDICATIONS:  Outpatient Encounter Medications as of 01/15/2020  Medication Sig  . aspirin EC 81 MG tablet Take 81 mg by mouth daily.  Marland Kitchen atorvastatin (LIPITOR) 20 MG tablet Take 1 tablet (20 mg total) by mouth daily.  . balsalazide (COLAZAL) 750 MG capsule Take 2,250 mg by mouth 3 (three) times daily.  . clopidogrel (PLAVIX) 75 MG tablet Take 1 tablet (75 mg total) by mouth daily.  Marland Kitchen escitalopram (LEXAPRO) 5 MG tablet Take 10 mg by mouth daily.  . ferrous sulfate 325 (65 FE) MG tablet Take 325 mg by mouth daily with breakfast.  . galantamine (RAZADYNE) 12 MG tablet Take 12 mg by mouth 2 (two) times daily with a meal.  . lisinopril (PRINIVIL,ZESTRIL) 10 MG tablet Take 1 tablet by mouth once daily (Patient taking differently: Take 10 mg by mouth daily. )  . memantine (NAMENDA) 10 MG tablet Take 10 mg by mouth 2 (two) times daily.  . metoprolol tartrate (LOPRESSOR) 25 MG tablet Take 1 tablet (25 mg total) by mouth 2 (two) times daily.  . nitroGLYCERIN (NITROSTAT) 0.4 MG SL tablet Place 1 tablet (0.4 mg total) under the tongue every 5 (five) minutes as needed for chest pain.  Marland Kitchen omeprazole (PRILOSEC) 20 MG capsule Take 20 mg by mouth daily.  . vitamin B-12 (CYANOCOBALAMIN) 1000 MCG tablet Take 1,000 mcg by mouth daily.   No facility-administered encounter medications on file as of 01/15/2020.      Wrenley Sayed Marlena Clipper, NP

## 2020-01-19 DIAGNOSIS — S8261XD Displaced fracture of lateral malleolus of right fibula, subsequent encounter for closed fracture with routine healing: Secondary | ICD-10-CM | POA: Diagnosis not present

## 2020-01-23 ENCOUNTER — Ambulatory Visit: Payer: PPO | Admitting: Cardiovascular Disease

## 2020-02-24 DIAGNOSIS — F0280 Dementia in other diseases classified elsewhere without behavioral disturbance: Secondary | ICD-10-CM | POA: Diagnosis not present

## 2020-02-24 DIAGNOSIS — G309 Alzheimer's disease, unspecified: Secondary | ICD-10-CM | POA: Diagnosis not present

## 2020-02-24 DIAGNOSIS — F0150 Vascular dementia without behavioral disturbance: Secondary | ICD-10-CM | POA: Diagnosis not present

## 2020-02-24 DIAGNOSIS — Z0000 Encounter for general adult medical examination without abnormal findings: Secondary | ICD-10-CM | POA: Diagnosis not present

## 2020-02-24 DIAGNOSIS — R413 Other amnesia: Secondary | ICD-10-CM | POA: Diagnosis not present

## 2020-02-24 DIAGNOSIS — E538 Deficiency of other specified B group vitamins: Secondary | ICD-10-CM | POA: Diagnosis not present

## 2020-02-24 DIAGNOSIS — D649 Anemia, unspecified: Secondary | ICD-10-CM | POA: Diagnosis not present

## 2020-02-24 DIAGNOSIS — I2510 Atherosclerotic heart disease of native coronary artery without angina pectoris: Secondary | ICD-10-CM | POA: Diagnosis not present

## 2020-02-24 DIAGNOSIS — R7309 Other abnormal glucose: Secondary | ICD-10-CM | POA: Diagnosis not present

## 2020-02-24 DIAGNOSIS — K51311 Ulcerative (chronic) rectosigmoiditis with rectal bleeding: Secondary | ICD-10-CM | POA: Diagnosis not present

## 2020-03-01 ENCOUNTER — Ambulatory Visit: Payer: PPO | Admitting: Student

## 2020-03-02 DIAGNOSIS — Z8781 Personal history of (healed) traumatic fracture: Secondary | ICD-10-CM | POA: Diagnosis not present

## 2020-03-02 DIAGNOSIS — G309 Alzheimer's disease, unspecified: Secondary | ICD-10-CM | POA: Diagnosis not present

## 2020-03-02 DIAGNOSIS — F0150 Vascular dementia without behavioral disturbance: Secondary | ICD-10-CM | POA: Diagnosis not present

## 2020-03-02 DIAGNOSIS — F0280 Dementia in other diseases classified elsewhere without behavioral disturbance: Secondary | ICD-10-CM | POA: Diagnosis not present

## 2020-03-02 DIAGNOSIS — Z6833 Body mass index (BMI) 33.0-33.9, adult: Secondary | ICD-10-CM | POA: Diagnosis not present

## 2020-03-02 DIAGNOSIS — I2510 Atherosclerotic heart disease of native coronary artery without angina pectoris: Secondary | ICD-10-CM | POA: Diagnosis not present

## 2020-03-02 DIAGNOSIS — R7309 Other abnormal glucose: Secondary | ICD-10-CM | POA: Diagnosis not present

## 2020-03-02 DIAGNOSIS — D649 Anemia, unspecified: Secondary | ICD-10-CM | POA: Diagnosis not present

## 2020-03-02 DIAGNOSIS — K5190 Ulcerative colitis, unspecified, without complications: Secondary | ICD-10-CM | POA: Diagnosis not present

## 2020-03-25 ENCOUNTER — Other Ambulatory Visit: Payer: Self-pay

## 2020-03-25 ENCOUNTER — Other Ambulatory Visit: Payer: PPO | Admitting: Adult Health Nurse Practitioner

## 2020-04-09 ENCOUNTER — Telehealth: Payer: Self-pay | Admitting: Adult Health Nurse Practitioner

## 2020-04-09 NOTE — Telephone Encounter (Signed)
Called patient's home number to offer to reschedule the 03/25/20 Palliative f/u visit (due to NP was out sick that day), no answer - left message requesting a return call to reschedule visit.  Also called wife's cell number and left message as well, requesting a return call.

## 2020-04-15 ENCOUNTER — Telehealth: Payer: Self-pay | Admitting: Adult Health Nurse Practitioner

## 2020-04-15 NOTE — Telephone Encounter (Signed)
Called home # to reschedule the 03/25/20 Palliative f/u visit, no answer - left my name and call back number requesting a return call.    I also called wife's cell and left a message asking if they wished to reschedle the f/u visit and also to find out if they wish to continue Paliative services or not - left my name and call back number requesting a return call by Friday, 04/16/20.

## 2020-04-27 ENCOUNTER — Other Ambulatory Visit: Payer: Self-pay

## 2020-04-27 ENCOUNTER — Ambulatory Visit (INDEPENDENT_AMBULATORY_CARE_PROVIDER_SITE_OTHER): Payer: PPO | Admitting: Cardiovascular Disease

## 2020-04-27 ENCOUNTER — Encounter: Payer: Self-pay | Admitting: Cardiovascular Disease

## 2020-04-27 DIAGNOSIS — I255 Ischemic cardiomyopathy: Secondary | ICD-10-CM | POA: Diagnosis not present

## 2020-04-27 DIAGNOSIS — I2119 ST elevation (STEMI) myocardial infarction involving other coronary artery of inferior wall: Secondary | ICD-10-CM

## 2020-04-27 NOTE — Patient Instructions (Signed)
Medication Instructions:  Your physician recommends that you continue on your current medications as directed. Please refer to the Current Medication list given to you today.  *If you need a refill on your cardiac medications before your next appointment, please call your pharmacy*   Follow-Up: At [facility], you and your health needs are our priority.  As part of our continuing mission to provide you with exceptional heart care, we have created designated Provider Care Teams.  These Care Teams include your primary Cardiologist (physician) and Advanced Practice Providers (APPs -  Physician Assistants and Nurse Practitioners) who all work together to provide you with the care you need, when you need it.  We recommend signing up for the patient portal called "MyChart".  Sign up information is provided on this After Visit Summary.  MyChart is used to connect with patients for Virtual Visits (Telemedicine).  Patients are able to view lab/test results, encounter notes, upcoming appointments, etc.  Non-urgent messages can be sent to your provider as well.   To learn more about what you can do with MyChart, go to [email or web address].    Your next appointment:   No future appointments made. If you need an appointment please give our office a call.   Provider:   Nanetta Batty, MD

## 2020-04-27 NOTE — Progress Notes (Signed)
04/27/2020 Jeff Key   March 21, 1945  [id number]  Primary Physician [name], MD Primary Cardiologist: Runell Gess MD Milagros Loll, [place], [place]  HPI:  Jeff Key is a 75 y.o.  Caucasian male father of 23, grandfather and 6 grandchildren who lives in [place] I last saw him in the office2/04/2019. He is accompanied by his wife Agustin Cree today.  He has newly diagnosed Alzheimer's disease and was recently admitted to [facility] in September for pneumonia. He was brought into the ER on Thanksgiving day with nonspecific symptoms however he was complaining of chest pain apparently and an EKG showed inferior ST segment elevation. He was transferred by EMS to come in for angiography and urgent intervention. He had a totally occluded proximal dominant RCA stenosis which I stented using a synergy drug-eluting stent. He had residual mid LAD and circumflex disease. His EF was mildly depressed. Treated with aspirin and Brilenta has remained stable. His troponin peaked at 40. He underwent staged LAD and circumflex PCI and drug-eluting stenting. Right femoral approach on 02/15/16 and this was uncomplicated. He was discharged on the following day. Apparently he was seen in early follow-up in the office and a bruit was noted.He had a small pseudoaneurysm by duplex ultrasound performed 02/22/16. Follow-up ultrasound recently performed showed that this had spontaneously occluded. His EF likewise has somewhat improved from the 40-45% range back in November up to 45-50% recently by 2-D echocardiogram.  Since I saw him a year ago his Alzheimer's disease has progressed.  He is minimally communicative.  He is in a wheelchair because of an ankle injury.  He is not communicative chest pain or shortness of breath to his wife.   Current Meds  Medication Sig  . aspirin EC 81 MG tablet Take 81 mg by mouth daily.  Marland Kitchen atorvastatin (LIPITOR) 20 MG tablet Take 1 tablet (20 mg total) by mouth daily.  .  balsalazide (COLAZAL) 750 MG capsule Take 2,250 mg by mouth 3 (three) times daily.  . clopidogrel (PLAVIX) 75 MG tablet Take 1 tablet (75 mg total) by mouth daily.  Marland Kitchen escitalopram (LEXAPRO) 5 MG tablet Take 10 mg by mouth daily.  . ferrous sulfate 325 (65 FE) MG tablet Take 325 mg by mouth daily with breakfast.  . galantamine (RAZADYNE) 12 MG tablet Take 12 mg by mouth 2 (two) times daily with a meal.  . lisinopril (PRINIVIL,ZESTRIL) 10 MG tablet Take 1 tablet by mouth once daily (Patient taking differently: Take 10 mg by mouth daily.)  . memantine (NAMENDA) 10 MG tablet Take 10 mg by mouth 2 (two) times daily.  . metoprolol tartrate (LOPRESSOR) 25 MG tablet Take 1 tablet (25 mg total) by mouth 2 (two) times daily.  . nitroGLYCERIN (NITROSTAT) 0.4 MG SL tablet Place 1 tablet (0.4 mg total) under the tongue every 5 (five) minutes as needed for chest pain.  Marland Kitchen omeprazole (PRILOSEC) 20 MG capsule Take 20 mg by mouth daily.  . vitamin B-12 (CYANOCOBALAMIN) 1000 MCG tablet Take 1,000 mcg by mouth daily.     No Known Allergies  Social History   Socioeconomic History  . Marital status: Married    Spouse name: Not on file  . Number of children: Not on file  . Years of education: Not on file  . Highest education level: Not on file  Occupational History  . Not on file  Tobacco Use  . Smoking status: Former Smoker    Packs/day: 2.00    Years: 30.00  Pack years: 60.00    Types: Cigarettes    Quit date: 1990    Years since quitting: 32.1  . Smokeless tobacco: Never Used  Substance and Sexual Activity  . Alcohol use: No    Alcohol/week: 0.0 standard drinks  . Drug use: No  . Sexual activity: Never  Other Topics Concern  . Not on file  Social History Narrative  . Not on file   Social Determinants of Health   Financial Resource Strain: Not on file  Food Insecurity: Not on file  Transportation Needs: Not on file  Physical Activity: Not on file  Stress: Not on file  Social  Connections: Not on file  Intimate Partner Violence: Not on file     Review of Systems: General: negative for chills, fever, night sweats or weight changes.  Cardiovascular: negative for chest pain, dyspnea on exertion, edema, orthopnea, palpitations, paroxysmal nocturnal dyspnea or shortness of breath Dermatological: negative for rash Respiratory: negative for cough or wheezing Urologic: negative for hematuria Abdominal: negative for nausea, vomiting, diarrhea, bright red blood per rectum, melena, or hematemesis Neurologic: negative for visual changes, syncope, or dizziness All other systems reviewed and are otherwise negative except as noted above.    Blood pressure 119/82, pulse 84, height 5\' 11"  (1.803 m), weight 240 lb (108.9 kg), SpO2 99 %.  General appearance: alert and no distress Neck: no adenopathy, no carotid bruit, no JVD, supple, symmetrical, trachea midline and thyroid not enlarged, symmetric, no tenderness/mass/nodules Lungs: clear to auscultation bilaterally Heart: regular rate and rhythm, S1, S2 normal, no murmur, click, rub or gallop Extremities: extremities normal, atraumatic, no cyanosis or edema Pulses: 2+ and symmetric Skin: Skin color, texture, turgor normal. No rashes or lesions Neurologic: Alert and oriented X 3, normal strength and tone. Normal symmetric reflexes. Normal coordination and gait  EKG not performed today  ASSESSMENT AND PLAN:   STEMI involving oth coronary artery of inferior wall (HCC) History of CAD status post inferior STEMI on Thanksgiving day 2017.  I performed urgent catheterization revealing an occluded RCA which I stented using a synergy drug-eluting stent.  He had residual mid LAD and circumflex disease.  His EF was mildly depressed.  I performed staged intervention of his LAD and circumflex after that via the femoral approach.  He did have a small pseudoaneurysm of follow-up which spontaneously resolved.  He remains on dual antiplatelet  therapy.  Because of his Alzheimer's disease he is minimally communicative but has not complained of chest pain or shortness of breath to his wife.  Ischemic cardiomyopathy History of ischemic cardiomyopathy with an EF in the 45 to 50% range by his most recent echo 05/18/2016.      07/18/2016 MD FACP,FACC,FAHA, [facility] 04/27/2020 11:40 AM

## 2020-04-27 NOTE — Assessment & Plan Note (Signed)
History of ischemic cardiomyopathy with an EF in the 45 to 50% range by his most recent echo 05/18/2016.

## 2020-04-27 NOTE — Assessment & Plan Note (Signed)
History of CAD status post inferior STEMI on Thanksgiving day 2017.  I performed urgent catheterization revealing an occluded RCA which I stented using a synergy drug-eluting stent.  He had residual mid LAD and circumflex disease.  His EF was mildly depressed.  I performed staged intervention of his LAD and circumflex after that via the femoral approach.  He did have a small pseudoaneurysm of follow-up which spontaneously resolved.  He remains on dual antiplatelet therapy.  Because of his Alzheimer's disease he is minimally communicative but has not complained of chest pain or shortness of breath to his wife.

## 2020-05-11 ENCOUNTER — Telehealth: Payer: Self-pay

## 2020-05-11 NOTE — Telephone Encounter (Signed)
Volunteer called patient on behalf of Palliative Care and did not get a answer from patient/family. ° °

## 2020-05-13 ENCOUNTER — Telehealth: Payer: Self-pay | Admitting: Adult Health Nurse Practitioner

## 2020-05-13 NOTE — Telephone Encounter (Signed)
Called patient's wife to schedule f/u visit, no answer - left message requesting a return call by the end of the day on Fri. 05/14/20 to let us know if they wish to continue with Palliative services or not and if we do not hear back we would discharged patient and notify the PCP

## 2020-05-15 ENCOUNTER — Other Ambulatory Visit: Payer: Self-pay | Admitting: Cardiovascular Disease

## 2020-05-19 ENCOUNTER — Other Ambulatory Visit: Payer: Self-pay | Admitting: Cardiovascular Disease

## 2020-06-22 DIAGNOSIS — D649 Anemia, unspecified: Secondary | ICD-10-CM | POA: Diagnosis not present

## 2020-06-22 DIAGNOSIS — F330 Major depressive disorder, recurrent, mild: Secondary | ICD-10-CM | POA: Diagnosis not present

## 2020-06-22 DIAGNOSIS — F0280 Dementia in other diseases classified elsewhere without behavioral disturbance: Secondary | ICD-10-CM | POA: Diagnosis not present

## 2020-06-22 DIAGNOSIS — Z8781 Personal history of (healed) traumatic fracture: Secondary | ICD-10-CM | POA: Diagnosis not present

## 2020-06-22 DIAGNOSIS — K5190 Ulcerative colitis, unspecified, without complications: Secondary | ICD-10-CM | POA: Diagnosis not present

## 2020-06-22 DIAGNOSIS — I2510 Atherosclerotic heart disease of native coronary artery without angina pectoris: Secondary | ICD-10-CM | POA: Diagnosis not present

## 2020-06-22 DIAGNOSIS — R7309 Other abnormal glucose: Secondary | ICD-10-CM | POA: Diagnosis not present

## 2020-06-22 DIAGNOSIS — Z6833 Body mass index (BMI) 33.0-33.9, adult: Secondary | ICD-10-CM | POA: Diagnosis not present

## 2020-06-22 DIAGNOSIS — G309 Alzheimer's disease, unspecified: Secondary | ICD-10-CM | POA: Diagnosis not present

## 2020-06-22 DIAGNOSIS — F0150 Vascular dementia without behavioral disturbance: Secondary | ICD-10-CM | POA: Diagnosis not present

## 2020-06-22 DIAGNOSIS — Z6835 Body mass index (BMI) 35.0-35.9, adult: Secondary | ICD-10-CM | POA: Diagnosis not present

## 2020-06-22 DIAGNOSIS — Z125 Encounter for screening for malignant neoplasm of prostate: Secondary | ICD-10-CM | POA: Diagnosis not present

## 2020-06-22 DIAGNOSIS — Z0000 Encounter for general adult medical examination without abnormal findings: Secondary | ICD-10-CM | POA: Diagnosis not present

## 2020-06-26 ENCOUNTER — Other Ambulatory Visit: Payer: Self-pay | Admitting: Cardiovascular Disease

## 2020-07-26 ENCOUNTER — Emergency Department: Payer: PPO

## 2020-07-26 ENCOUNTER — Encounter: Payer: Self-pay | Admitting: Radiology

## 2020-07-26 ENCOUNTER — Other Ambulatory Visit: Payer: Self-pay

## 2020-07-26 ENCOUNTER — Inpatient Hospital Stay
Admission: EM | Admit: 2020-07-26 | Discharge: 2020-07-29 | DRG: 871 | Disposition: A | Discharge status: Home / Self Care | Payer: PPO | Attending: Internal Medicine | Admitting: Internal Medicine

## 2020-07-26 DIAGNOSIS — I255 Ischemic cardiomyopathy: Secondary | ICD-10-CM | POA: Diagnosis present

## 2020-07-26 DIAGNOSIS — K219 Gastro-esophageal reflux disease without esophagitis: Secondary | ICD-10-CM | POA: Diagnosis present

## 2020-07-26 DIAGNOSIS — R0602 Shortness of breath: Secondary | ICD-10-CM | POA: Diagnosis not present

## 2020-07-26 DIAGNOSIS — R0902 Hypoxemia: Secondary | ICD-10-CM | POA: Diagnosis present

## 2020-07-26 DIAGNOSIS — J9811 Atelectasis: Secondary | ICD-10-CM | POA: Diagnosis not present

## 2020-07-26 DIAGNOSIS — J9601 Acute respiratory failure with hypoxia: Secondary | ICD-10-CM

## 2020-07-26 DIAGNOSIS — E785 Hyperlipidemia, unspecified: Secondary | ICD-10-CM | POA: Diagnosis not present

## 2020-07-26 DIAGNOSIS — K429 Umbilical hernia without obstruction or gangrene: Secondary | ICD-10-CM | POA: Diagnosis not present

## 2020-07-26 DIAGNOSIS — A419 Sepsis, unspecified organism: Secondary | ICD-10-CM

## 2020-07-26 DIAGNOSIS — G9341 Metabolic encephalopathy: Secondary | ICD-10-CM | POA: Diagnosis present

## 2020-07-26 DIAGNOSIS — I2510 Atherosclerotic heart disease of native coronary artery without angina pectoris: Secondary | ICD-10-CM | POA: Diagnosis not present

## 2020-07-26 DIAGNOSIS — I252 Old myocardial infarction: Secondary | ICD-10-CM

## 2020-07-26 DIAGNOSIS — A4189 Other specified sepsis: Principal | ICD-10-CM | POA: Diagnosis present

## 2020-07-26 DIAGNOSIS — Z7902 Long term (current) use of antithrombotics/antiplatelets: Secondary | ICD-10-CM | POA: Diagnosis not present

## 2020-07-26 DIAGNOSIS — K449 Diaphragmatic hernia without obstruction or gangrene: Secondary | ICD-10-CM | POA: Diagnosis not present

## 2020-07-26 DIAGNOSIS — F0280 Dementia in other diseases classified elsewhere without behavioral disturbance: Secondary | ICD-10-CM | POA: Diagnosis present

## 2020-07-26 DIAGNOSIS — R0689 Other abnormalities of breathing: Secondary | ICD-10-CM | POA: Diagnosis not present

## 2020-07-26 DIAGNOSIS — E119 Type 2 diabetes mellitus without complications: Secondary | ICD-10-CM | POA: Diagnosis present

## 2020-07-26 DIAGNOSIS — R000 Tachycardia, unspecified: Secondary | ICD-10-CM | POA: Diagnosis not present

## 2020-07-26 DIAGNOSIS — Z7984 Long term (current) use of oral hypoglycemic drugs: Secondary | ICD-10-CM | POA: Diagnosis not present

## 2020-07-26 DIAGNOSIS — Z79899 Other long term (current) drug therapy: Secondary | ICD-10-CM

## 2020-07-26 DIAGNOSIS — R404 Transient alteration of awareness: Secondary | ICD-10-CM | POA: Diagnosis not present

## 2020-07-26 DIAGNOSIS — K8020 Calculus of gallbladder without cholecystitis without obstruction: Secondary | ICD-10-CM | POA: Diagnosis not present

## 2020-07-26 DIAGNOSIS — Z955 Presence of coronary angioplasty implant and graft: Secondary | ICD-10-CM

## 2020-07-26 DIAGNOSIS — Z7982 Long term (current) use of aspirin: Secondary | ICD-10-CM | POA: Diagnosis not present

## 2020-07-26 DIAGNOSIS — Z87891 Personal history of nicotine dependence: Secondary | ICD-10-CM

## 2020-07-26 DIAGNOSIS — M2550 Pain in unspecified joint: Secondary | ICD-10-CM | POA: Diagnosis not present

## 2020-07-26 DIAGNOSIS — R509 Fever, unspecified: Secondary | ICD-10-CM | POA: Diagnosis not present

## 2020-07-26 DIAGNOSIS — I10 Essential (primary) hypertension: Secondary | ICD-10-CM | POA: Diagnosis not present

## 2020-07-26 DIAGNOSIS — A080 Rotaviral enteritis: Secondary | ICD-10-CM | POA: Diagnosis present

## 2020-07-26 DIAGNOSIS — K567 Ileus, unspecified: Secondary | ICD-10-CM

## 2020-07-26 DIAGNOSIS — R0989 Other specified symptoms and signs involving the circulatory and respiratory systems: Secondary | ICD-10-CM

## 2020-07-26 DIAGNOSIS — Z20822 Contact with and (suspected) exposure to covid-19: Secondary | ICD-10-CM | POA: Diagnosis not present

## 2020-07-26 DIAGNOSIS — I5023 Acute on chronic systolic (congestive) heart failure: Secondary | ICD-10-CM

## 2020-07-26 DIAGNOSIS — I517 Cardiomegaly: Secondary | ICD-10-CM | POA: Diagnosis not present

## 2020-07-26 DIAGNOSIS — R1084 Generalized abdominal pain: Secondary | ICD-10-CM

## 2020-07-26 DIAGNOSIS — K529 Noninfective gastroenteritis and colitis, unspecified: Secondary | ICD-10-CM

## 2020-07-26 DIAGNOSIS — Z8711 Personal history of peptic ulcer disease: Secondary | ICD-10-CM

## 2020-07-26 DIAGNOSIS — R6520 Severe sepsis without septic shock: Secondary | ICD-10-CM | POA: Diagnosis not present

## 2020-07-26 DIAGNOSIS — G309 Alzheimer's disease, unspecified: Secondary | ICD-10-CM | POA: Diagnosis present

## 2020-07-26 DIAGNOSIS — Z8042 Family history of malignant neoplasm of prostate: Secondary | ICD-10-CM | POA: Diagnosis not present

## 2020-07-26 DIAGNOSIS — M47819 Spondylosis without myelopathy or radiculopathy, site unspecified: Secondary | ICD-10-CM | POA: Diagnosis not present

## 2020-07-26 DIAGNOSIS — E872 Acidosis: Secondary | ICD-10-CM | POA: Diagnosis present

## 2020-07-26 DIAGNOSIS — G300 Alzheimer's disease with early onset: Secondary | ICD-10-CM | POA: Diagnosis not present

## 2020-07-26 DIAGNOSIS — R21 Rash and other nonspecific skin eruption: Secondary | ICD-10-CM | POA: Diagnosis not present

## 2020-07-26 DIAGNOSIS — Z7401 Bed confinement status: Secondary | ICD-10-CM | POA: Diagnosis not present

## 2020-07-26 LAB — RESP PANEL BY RT-PCR (FLU A&B, COVID) ARPGX2
Influenza A by PCR: NEGATIVE
Influenza B by PCR: NEGATIVE
SARS Coronavirus 2 by RT PCR: NEGATIVE

## 2020-07-26 LAB — CBC WITH DIFFERENTIAL/PLATELET
Abs Immature Granulocytes: 0.16 10*3/uL — ABNORMAL HIGH (ref 0.00–0.07)
Basophils Absolute: 0 10*3/uL (ref 0.0–0.1)
Basophils Relative: 0 %
Eosinophils Absolute: 0.1 10*3/uL (ref 0.0–0.5)
Eosinophils Relative: 0 %
HCT: 40.7 % (ref 39.0–52.0)
Hemoglobin: 13.2 g/dL (ref 13.0–17.0)
Immature Granulocytes: 1 %
Lymphocytes Relative: 6 %
Lymphs Abs: 0.9 10*3/uL (ref 0.7–4.0)
MCH: 28.8 pg (ref 26.0–34.0)
MCHC: 32.4 g/dL (ref 30.0–36.0)
MCV: 88.9 fL (ref 80.0–100.0)
Monocytes Absolute: 0.8 10*3/uL (ref 0.1–1.0)
Monocytes Relative: 5 %
Neutro Abs: 12.7 10*3/uL — ABNORMAL HIGH (ref 1.7–7.7)
Neutrophils Relative %: 88 %
Platelets: 337 10*3/uL (ref 150–400)
RBC: 4.58 MIL/uL (ref 4.22–5.81)
RDW: 13.4 % (ref 11.5–15.5)
WBC: 14.6 10*3/uL — ABNORMAL HIGH (ref 4.0–10.5)
nRBC: 0 % (ref 0.0–0.2)

## 2020-07-26 LAB — URINALYSIS, COMPLETE (UACMP) WITH MICROSCOPIC
Bilirubin Urine: NEGATIVE
Glucose, UA: NEGATIVE mg/dL
Hgb urine dipstick: NEGATIVE
Ketones, ur: NEGATIVE mg/dL
Leukocytes,Ua: NEGATIVE
Nitrite: NEGATIVE
Protein, ur: NEGATIVE mg/dL
Specific Gravity, Urine: 1.028 (ref 1.005–1.030)
pH: 5 (ref 5.0–8.0)

## 2020-07-26 LAB — COMPREHENSIVE METABOLIC PANEL
ALT: 17 U/L (ref 0–44)
AST: 28 U/L (ref 15–41)
Albumin: 4.2 g/dL (ref 3.5–5.0)
Alkaline Phosphatase: 106 U/L (ref 38–126)
Anion gap: 15 (ref 5–15)
BUN: 24 mg/dL — ABNORMAL HIGH (ref 8–23)
CO2: 26 mmol/L (ref 22–32)
Calcium: 9.3 mg/dL (ref 8.9–10.3)
Chloride: 97 mmol/L — ABNORMAL LOW (ref 98–111)
Creatinine, Ser: 1.28 mg/dL — ABNORMAL HIGH (ref 0.61–1.24)
GFR, Estimated: 58 mL/min — ABNORMAL LOW (ref >60)
Glucose, Bld: 197 mg/dL — ABNORMAL HIGH (ref 70–99)
Potassium: 4.7 mmol/L (ref 3.5–5.1)
Sodium: 138 mmol/L (ref 135–145)
Total Bilirubin: 0.7 mg/dL (ref 0.3–1.2)
Total Protein: 8.9 g/dL — ABNORMAL HIGH (ref 6.5–8.1)

## 2020-07-26 LAB — APTT: aPTT: 28 seconds (ref 24–36)

## 2020-07-26 LAB — LACTIC ACID, PLASMA
Lactic Acid, Venous: 3.9 mmol/L (ref 0.5–1.9)
Lactic Acid, Venous: 3.4 mmol/L (ref 0.5–1.9)

## 2020-07-26 LAB — PROTIME-INR
INR: 1 (ref 0.8–1.2)
Prothrombin Time: 13.2 seconds (ref 11.4–15.2)

## 2020-07-26 MED ORDER — ONDANSETRON HCL 4 MG/2ML IJ SOLN
INTRAMUSCULAR | Status: AC
  Administered 2020-07-26: 4 mg via INTRAVENOUS
  Filled 2020-07-26: qty 2

## 2020-07-26 MED ORDER — SODIUM CHLORIDE 0.9 % IV BOLUS (SEPSIS)
1000.0000 mL | Freq: Once | INTRAVENOUS | Status: AC
  Administered 2020-07-26: 1000 mL via INTRAVENOUS

## 2020-07-26 MED ORDER — SODIUM CHLORIDE 0.9 % IV SOLN
2.0000 g | INTRAVENOUS | Status: DC
  Administered 2020-07-26: 2 g via INTRAVENOUS
  Filled 2020-07-26: qty 20

## 2020-07-26 MED ORDER — ONDANSETRON HCL 4 MG/2ML IJ SOLN
4.0000 mg | Freq: Once | INTRAMUSCULAR | Status: AC
Start: 2020-07-26 — End: 2020-07-26

## 2020-07-26 MED ORDER — IOHEXOL 300 MG/ML  SOLN
100.0000 mL | Freq: Once | INTRAMUSCULAR | Status: AC | PRN
  Administered 2020-07-26: 100 mL via INTRAVENOUS

## 2020-07-26 MED ORDER — LACTATED RINGERS IV SOLN: INTRAVENOUS | Status: DC

## 2020-07-26 MED ORDER — SODIUM CHLORIDE 0.9 % IV SOLN
500.0000 mg | INTRAVENOUS | Status: DC
  Administered 2020-07-26: 500 mg via INTRAVENOUS
  Filled 2020-07-26 (×2): qty 500

## 2020-07-26 NOTE — ED Notes (Signed)
Patient had large, liquid BM. Changed patient and linens.

## 2020-07-26 NOTE — Consult Note (Signed)
CODE SEPSIS - PHARMACY COMMUNICATION  **Broad Spectrum Antibiotics should be administered within 1 hour of Sepsis diagnosis**  Time Code Sepsis Called/Page Received: 6314   Antibiotics Ordered: ceftriaxone, azithromycin  Time of 1st antibiotic administration: 2000  Additional action taken by pharmacy: n/a  If necessary, Name of Provider/Nurse Contacted: n/a    Sharen Hones ,PharmD, BCPS Clinical Pharmacist  07/26/2020  7:39 PM

## 2020-07-26 NOTE — ED Notes (Signed)
Patient in CT

## 2020-07-26 NOTE — ED Triage Notes (Signed)
Reported via EMS for vomiting and diarrhea since this AM. Patient has baseline dementia and is nonverbal.

## 2020-07-26 NOTE — ED Notes (Signed)
Patient reporting pain again. Informed Dr. Scotty Court. Awaiting further order.

## 2020-07-27 ENCOUNTER — Encounter: Payer: Self-pay | Admitting: Internal Medicine

## 2020-07-27 DIAGNOSIS — Z79899 Other long term (current) drug therapy: Secondary | ICD-10-CM | POA: Diagnosis not present

## 2020-07-27 DIAGNOSIS — G300 Alzheimer's disease with early onset: Secondary | ICD-10-CM

## 2020-07-27 DIAGNOSIS — I255 Ischemic cardiomyopathy: Secondary | ICD-10-CM | POA: Diagnosis not present

## 2020-07-27 DIAGNOSIS — Z20822 Contact with and (suspected) exposure to covid-19: Secondary | ICD-10-CM | POA: Diagnosis present

## 2020-07-27 DIAGNOSIS — E119 Type 2 diabetes mellitus without complications: Secondary | ICD-10-CM | POA: Diagnosis present

## 2020-07-27 DIAGNOSIS — F0280 Dementia in other diseases classified elsewhere without behavioral disturbance: Secondary | ICD-10-CM | POA: Diagnosis present

## 2020-07-27 DIAGNOSIS — A4189 Other specified sepsis: Secondary | ICD-10-CM | POA: Diagnosis present

## 2020-07-27 DIAGNOSIS — Z7902 Long term (current) use of antithrombotics/antiplatelets: Secondary | ICD-10-CM | POA: Diagnosis not present

## 2020-07-27 DIAGNOSIS — R0602 Shortness of breath: Secondary | ICD-10-CM | POA: Diagnosis present

## 2020-07-27 DIAGNOSIS — J9811 Atelectasis: Secondary | ICD-10-CM | POA: Diagnosis present

## 2020-07-27 DIAGNOSIS — K567 Ileus, unspecified: Secondary | ICD-10-CM

## 2020-07-27 DIAGNOSIS — A080 Rotaviral enteritis: Secondary | ICD-10-CM | POA: Diagnosis present

## 2020-07-27 DIAGNOSIS — I2510 Atherosclerotic heart disease of native coronary artery without angina pectoris: Secondary | ICD-10-CM

## 2020-07-27 DIAGNOSIS — G309 Alzheimer's disease, unspecified: Secondary | ICD-10-CM | POA: Diagnosis present

## 2020-07-27 DIAGNOSIS — A419 Sepsis, unspecified organism: Secondary | ICD-10-CM

## 2020-07-27 DIAGNOSIS — Z87891 Personal history of nicotine dependence: Secondary | ICD-10-CM | POA: Diagnosis not present

## 2020-07-27 DIAGNOSIS — Z7982 Long term (current) use of aspirin: Secondary | ICD-10-CM | POA: Diagnosis not present

## 2020-07-27 DIAGNOSIS — K529 Noninfective gastroenteritis and colitis, unspecified: Secondary | ICD-10-CM

## 2020-07-27 DIAGNOSIS — K219 Gastro-esophageal reflux disease without esophagitis: Secondary | ICD-10-CM | POA: Diagnosis present

## 2020-07-27 DIAGNOSIS — E872 Acidosis: Secondary | ICD-10-CM | POA: Diagnosis present

## 2020-07-27 DIAGNOSIS — R0902 Hypoxemia: Secondary | ICD-10-CM | POA: Diagnosis present

## 2020-07-27 DIAGNOSIS — Z955 Presence of coronary angioplasty implant and graft: Secondary | ICD-10-CM | POA: Diagnosis not present

## 2020-07-27 DIAGNOSIS — I252 Old myocardial infarction: Secondary | ICD-10-CM | POA: Diagnosis not present

## 2020-07-27 DIAGNOSIS — Z8711 Personal history of peptic ulcer disease: Secondary | ICD-10-CM | POA: Diagnosis not present

## 2020-07-27 DIAGNOSIS — Z8042 Family history of malignant neoplasm of prostate: Secondary | ICD-10-CM | POA: Diagnosis not present

## 2020-07-27 DIAGNOSIS — G9341 Metabolic encephalopathy: Secondary | ICD-10-CM | POA: Diagnosis not present

## 2020-07-27 DIAGNOSIS — E785 Hyperlipidemia, unspecified: Secondary | ICD-10-CM | POA: Diagnosis present

## 2020-07-27 DIAGNOSIS — Z7984 Long term (current) use of oral hypoglycemic drugs: Secondary | ICD-10-CM | POA: Diagnosis not present

## 2020-07-27 LAB — GASTROINTESTINAL PANEL BY PCR, STOOL (REPLACES STOOL CULTURE)

## 2020-07-27 LAB — COMPREHENSIVE METABOLIC PANEL
ALT: 12 U/L (ref 0–44)
AST: 22 U/L (ref 15–41)
Albumin: 3 g/dL — ABNORMAL LOW (ref 3.5–5.0)
Alkaline Phosphatase: 60 U/L (ref 38–126)
Anion gap: 9 (ref 5–15)
BUN: 25 mg/dL — ABNORMAL HIGH (ref 8–23)
CO2: 25 mmol/L (ref 22–32)
Calcium: 7.9 mg/dL — ABNORMAL LOW (ref 8.9–10.3)
Chloride: 102 mmol/L (ref 98–111)
Creatinine, Ser: 1.19 mg/dL (ref 0.61–1.24)
GFR, Estimated: >60 mL/min (ref >60)
Glucose, Bld: 129 mg/dL — ABNORMAL HIGH (ref 70–99)
Potassium: 3.9 mmol/L (ref 3.5–5.1)
Sodium: 136 mmol/L (ref 135–145)
Total Bilirubin: 0.5 mg/dL (ref 0.3–1.2)
Total Protein: 6.6 g/dL (ref 6.5–8.1)

## 2020-07-27 LAB — GLUCOSE, CAPILLARY
Glucose-Capillary: 150 mg/dL — ABNORMAL HIGH (ref 70–99)
Glucose-Capillary: 134 mg/dL — ABNORMAL HIGH (ref 70–99)
Glucose-Capillary: 128 mg/dL — ABNORMAL HIGH (ref 70–99)
Glucose-Capillary: 114 mg/dL — ABNORMAL HIGH (ref 70–99)
Glucose-Capillary: 99 mg/dL (ref 70–99)

## 2020-07-27 LAB — PROTIME-INR
INR: 1.1 (ref 0.8–1.2)
Prothrombin Time: 14.2 seconds (ref 11.4–15.2)

## 2020-07-27 LAB — C DIFFICILE QUICK SCREEN W PCR REFLEX
C Diff antigen: NEGATIVE
C Diff interpretation: NOT DETECTED
C Diff toxin: NEGATIVE

## 2020-07-27 LAB — CBC
HCT: 31.4 % — ABNORMAL LOW (ref 39.0–52.0)
Hemoglobin: 9.9 g/dL — ABNORMAL LOW (ref 13.0–17.0)
MCH: 28.4 pg (ref 26.0–34.0)
MCHC: 31.5 g/dL (ref 30.0–36.0)
MCV: 90 fL (ref 80.0–100.0)
Platelets: 262 10*3/uL (ref 150–400)
RBC: 3.49 MIL/uL — ABNORMAL LOW (ref 4.22–5.81)
RDW: 13.7 % (ref 11.5–15.5)
WBC: 13 10*3/uL — ABNORMAL HIGH (ref 4.0–10.5)
nRBC: 0 % (ref 0.0–0.2)

## 2020-07-27 LAB — HEMOGLOBIN A1C
Hgb A1c MFr Bld: 6.8 % — ABNORMAL HIGH (ref 4.8–5.6)
Mean Plasma Glucose: 148.46 mg/dL

## 2020-07-27 LAB — LACTIC ACID, PLASMA
Lactic Acid, Venous: 3.6 mmol/L (ref 0.5–1.9)
Lactic Acid, Venous: 2.7 mmol/L (ref 0.5–1.9)
Lactic Acid, Venous: 1.8 mmol/L (ref 0.5–1.9)
Lactic Acid, Venous: 2 mmol/L (ref 0.5–1.9)

## 2020-07-27 LAB — CORTISOL-AM, BLOOD: Cortisol - AM: 16.6 ug/dL (ref 6.7–22.6)

## 2020-07-27 LAB — PROCALCITONIN: Procalcitonin: 0.27 ng/mL

## 2020-07-27 MED ORDER — ENOXAPARIN SODIUM 60 MG/0.6ML IJ SOSY
0.5000 mg/kg | PREFILLED_SYRINGE | INTRAMUSCULAR | Status: DC
  Administered 2020-07-27 – 2020-07-29 (×3): 55 mg via SUBCUTANEOUS
  Filled 2020-07-27: qty 0.55
  Filled 2020-07-27 (×2): qty 0.6

## 2020-07-27 MED ORDER — LACTATED RINGERS IV SOLN: INTRAVENOUS | Status: DC

## 2020-07-27 MED ORDER — BALSALAZIDE DISODIUM 750 MG PO CAPS
2250.0000 mg | ORAL_CAPSULE | Freq: Three times a day (TID) | ORAL | Status: DC
  Administered 2020-07-27 – 2020-07-29 (×6): 2250 mg via ORAL
  Filled 2020-07-27 (×13): qty 3

## 2020-07-27 MED ORDER — CLOPIDOGREL BISULFATE 75 MG PO TABS
75.0000 mg | ORAL_TABLET | Freq: Every day | ORAL | Status: DC
  Administered 2020-07-27 – 2020-07-29 (×3): 75 mg via ORAL
  Filled 2020-07-27 (×3): qty 1

## 2020-07-27 MED ORDER — METRONIDAZOLE 500 MG/100ML IV SOLN
500.0000 mg | Freq: Once | INTRAVENOUS | Status: AC
  Administered 2020-07-27: 500 mg via INTRAVENOUS
  Filled 2020-07-27: qty 100

## 2020-07-27 MED ORDER — LACTATED RINGERS IV BOLUS
1500.0000 mL | Freq: Once | INTRAVENOUS | Status: AC
  Administered 2020-07-27: 1500 mL via INTRAVENOUS

## 2020-07-27 MED ORDER — GALANTAMINE HYDROBROMIDE 4 MG PO TABS
12.0000 mg | ORAL_TABLET | Freq: Two times a day (BID) | ORAL | Status: DC
  Administered 2020-07-27 – 2020-07-29 (×5): 12 mg via ORAL
  Filled 2020-07-27 (×6): qty 3

## 2020-07-27 MED ORDER — SODIUM CHLORIDE 0.9 % IV SOLN
2.0000 g | Freq: Three times a day (TID) | INTRAVENOUS | Status: DC
  Administered 2020-07-27: 2 g via INTRAVENOUS
  Filled 2020-07-27 (×4): qty 2

## 2020-07-27 MED ORDER — NITROGLYCERIN 0.4 MG SL SUBL: 0.4000 mg | SUBLINGUAL_TABLET | SUBLINGUAL | Status: DC | PRN

## 2020-07-27 MED ORDER — MEMANTINE HCL 5 MG PO TABS
10.0000 mg | ORAL_TABLET | Freq: Two times a day (BID) | ORAL | Status: DC
  Administered 2020-07-27 – 2020-07-29 (×5): 10 mg via ORAL
  Filled 2020-07-27 (×2): qty 2
  Filled 2020-07-27 (×2): qty 1
  Filled 2020-07-27: qty 2

## 2020-07-27 MED ORDER — LISINOPRIL 10 MG PO TABS
10.0000 mg | ORAL_TABLET | Freq: Every day | ORAL | Status: DC
  Administered 2020-07-27 – 2020-07-29 (×3): 10 mg via ORAL
  Filled 2020-07-27: qty 2
  Filled 2020-07-27 (×2): qty 1

## 2020-07-27 MED ORDER — METOPROLOL TARTRATE 25 MG PO TABS
25.0000 mg | ORAL_TABLET | Freq: Two times a day (BID) | ORAL | Status: DC
  Administered 2020-07-27 – 2020-07-29 (×5): 25 mg via ORAL
  Filled 2020-07-27 (×5): qty 1

## 2020-07-27 MED ORDER — INSULIN ASPART 100 UNIT/ML IJ SOLN: 0.0000 [IU] | Freq: Every day | INTRAMUSCULAR | Status: DC

## 2020-07-27 MED ORDER — SODIUM CHLORIDE 0.9 % IV SOLN: 2.0000 g | Freq: Once | INTRAVENOUS | Status: DC

## 2020-07-27 MED ORDER — CHLORHEXIDINE GLUCONATE CLOTH 2 % EX PADS
6.0000 | MEDICATED_PAD | Freq: Every day | CUTANEOUS | Status: DC
  Administered 2020-07-27 – 2020-07-28 (×2): 6 via TOPICAL

## 2020-07-27 MED ORDER — ONDANSETRON HCL 4 MG/2ML IJ SOLN
4.0000 mg | Freq: Four times a day (QID) | INTRAMUSCULAR | Status: DC | PRN
  Administered 2020-07-28: 4 mg via INTRAVENOUS
  Filled 2020-07-27 (×2): qty 2

## 2020-07-27 MED ORDER — PANTOPRAZOLE SODIUM 40 MG PO TBEC: 40.0000 mg | DELAYED_RELEASE_TABLET | Freq: Every day | ORAL | Status: DC

## 2020-07-27 MED ORDER — ESCITALOPRAM OXALATE 10 MG PO TABS
10.0000 mg | ORAL_TABLET | Freq: Every day | ORAL | Status: DC
  Administered 2020-07-27 – 2020-07-29 (×3): 10 mg via ORAL
  Filled 2020-07-27 (×3): qty 1

## 2020-07-27 MED ORDER — FERROUS SULFATE 325 (65 FE) MG PO TABS
325.0000 mg | ORAL_TABLET | Freq: Every day | ORAL | Status: DC
  Administered 2020-07-27 – 2020-07-29 (×3): 325 mg via ORAL
  Filled 2020-07-27 (×3): qty 1

## 2020-07-27 MED ORDER — PANTOPRAZOLE SODIUM 40 MG IV SOLR
40.0000 mg | INTRAVENOUS | Status: DC
  Administered 2020-07-27 – 2020-07-28 (×2): 40 mg via INTRAVENOUS
  Filled 2020-07-27: qty 40

## 2020-07-27 MED ORDER — ASPIRIN EC 81 MG PO TBEC
81.0000 mg | DELAYED_RELEASE_TABLET | Freq: Every day | ORAL | Status: DC
  Administered 2020-07-27 – 2020-07-29 (×3): 81 mg via ORAL
  Filled 2020-07-27 (×3): qty 1

## 2020-07-27 MED ORDER — ATORVASTATIN CALCIUM 20 MG PO TABS
20.0000 mg | ORAL_TABLET | Freq: Every day | ORAL | Status: DC
  Administered 2020-07-27 – 2020-07-29 (×3): 20 mg via ORAL
  Filled 2020-07-27 (×3): qty 1

## 2020-07-27 MED ORDER — ACETAMINOPHEN 10 MG/ML IV SOLN
1000.0000 mg | INTRAVENOUS | Status: AC
  Administered 2020-07-27: 1000 mg via INTRAVENOUS
  Filled 2020-07-27: qty 100

## 2020-07-27 MED ORDER — INSULIN ASPART 100 UNIT/ML IJ SOLN
0.0000 [IU] | Freq: Three times a day (TID) | INTRAMUSCULAR | Status: DC
  Administered 2020-07-27 – 2020-07-29 (×5): 1 [IU] via SUBCUTANEOUS
  Filled 2020-07-27 (×4): qty 1

## 2020-07-27 MED ORDER — ONDANSETRON HCL 4 MG PO TABS: 4.0000 mg | ORAL_TABLET | Freq: Four times a day (QID) | ORAL | Status: DC | PRN

## 2020-07-27 MED ORDER — VITAMIN B-12 1000 MCG PO TABS
1000.0000 ug | ORAL_TABLET | Freq: Every day | ORAL | Status: DC
  Administered 2020-07-27 – 2020-07-29 (×3): 1000 ug via ORAL
  Filled 2020-07-27 (×3): qty 1

## 2020-07-27 MED ORDER — METRONIDAZOLE 500 MG/100ML IV SOLN
500.0000 mg | Freq: Three times a day (TID) | INTRAVENOUS | Status: DC
Start: 2020-07-27 — End: 2020-07-27
  Administered 2020-07-27: 500 mg via INTRAVENOUS
  Filled 2020-07-27 (×4): qty 100

## 2020-07-27 NOTE — H&P (Signed)
History and Physical   Jeff Key ZOX:[id number] DOB: [birth year] DOA: 07/26/2020  Referring MD/NP/PA: Dr. Scotty Court  PCP: [name], MD   Outpatient Specialists: None  Patient coming from: Home  Chief Complaint: Fever and nausea  HPI: Jeff Key is a 75 y.o. male with medical history significant of advanced dementia, diabetes, coronary artery disease with ischemic cardiomyopathy, GERD, history of stomach ulcers, GERD was brought into the ED with vomiting and diarrhea since this morning.  Patient also apparently noted to have some level of hypoxia.  He is unable to communicate due to mental status changes.  Patient's work-up shows some enteritis and he meets sepsis criteria with temperature heart rate.  He will be admitted to the hospital for further evaluation and treatment..  ED Course: Temperature is 102.9, blood pressure 160/75, pulse 120 course of 30 amoxicillin 3% normal.  His white count is 14.6 hemoglobin 13.1 platelet 337.  Chemistry showed a BUN of 24 creatinine 1.28 and glucose 197.  Also lactic acidosis.  Urinalysis so far shows WBC 6-10 and rare bacteria.  Chest x-ray showed vascular congestion and edema no focal consolidation.  CT chest and abdomen shows enteritis with diarrheal state.  Also cholelithiasis.  Patient will be admitted for work-up.  Review of Systems: As per HPI otherwise 10 point review of systems negative.    Past Medical History:  Diagnosis Date  . Anemia ~ 11/2015  . CAD (coronary artery disease)    a. inf STEMI on 02/10/16. RCA treated with DES followed by staged PCI of LAD and Circumflex 02/14/16.  Marland Kitchen Dementia (HCC) dx'd 01/2016  . GERD (gastroesophageal reflux disease)   . History of stomach ulcers 1980s?  . Ischemic cardiomyopathy    a. 01/2016: EF 40-45%.     Past Surgical History:  Procedure Laterality Date  . CARDIAC CATHETERIZATION N/A 02/10/2016   Procedure: Left Heart Cath and Coronary Angiography;  Surgeon: Runell Gess,  MD;  Location: [facility] INVASIVE CV LAB;  Service: Cardiovascular;  Laterality: N/A;  . CARDIAC CATHETERIZATION N/A 02/10/2016   Procedure: Coronary Stent Intervention;  Surgeon: Runell Gess, MD;  Location: [facility] INVASIVE CV LAB;  Service: Cardiovascular;  Laterality: N/A;  . CARDIAC CATHETERIZATION N/A 02/14/2016   Procedure: Coronary Stent Intervention-LAD and CFX;  Surgeon: Runell Gess, MD;  Location: [facility] INVASIVE CV LAB;  Service: Cardiovascular;  Laterality: N/A;  . COLONOSCOPY WITH PROPOFOL N/A 05/06/2015   Procedure: COLONOSCOPY WITH PROPOFOL;  Surgeon: Wallace Cullens, MD;  Location: [facility] ENDOSCOPY;  Service: Gastroenterology;  Laterality: N/A;  . CORONARY ANGIOPLASTY WITH STENT PLACEMENT  02/14/2016   "2 stents"  . ESOPHAGOGASTRODUODENOSCOPY (EGD) WITH PROPOFOL N/A 05/06/2015   Procedure: ESOPHAGOGASTRODUODENOSCOPY (EGD) WITH PROPOFOL;  Surgeon: Wallace Cullens, MD;  Location: [facility] ENDOSCOPY;  Service: Gastroenterology;  Laterality: N/A;  . FOREARM FRACTURE SURGERY Right 1985   "got it tore up in [place]"  . FRACTURE SURGERY       reports that he quit smoking about 32 years ago. His smoking use included cigarettes. He has a 60.00 pack-year smoking history. He has never used smokeless tobacco. He reports that he does not drink alcohol and does not use drugs.  No Known Allergies  Family History  Problem Relation Age of Onset  . Prostate cancer Father   . Bladder Cancer Neg Hx   . Kidney cancer Neg Hx      Prior to Admission medications   Medication Sig Start Date End Date Taking? Authorizing Provider  aspirin  EC 81 MG tablet Take 81 mg by mouth daily.   Yes [provider]  atorvastatin (LIPITOR) 20 MG tablet Take 1 tablet by mouth once daily 05/20/20  Yes Runell GessBerry, Jonathan J, MD  balsalazide (COLAZAL) 750 MG capsule Take 2,250 mg by mouth 3 (three) times daily. 09/23/19  Yes [provider]  clopidogrel (PLAVIX) 75 MG tablet Take 1 tablet (75 mg total) by mouth daily. 06/15/17   Yes Runell GessBerry, Jonathan J, MD  escitalopram (LEXAPRO) 5 MG tablet Take 10 mg by mouth daily. 08/29/19  Yes [provider]  ferrous sulfate 325 (65 FE) MG tablet Take 325 mg by mouth daily with breakfast.   Yes [provider]  galantamine (RAZADYNE) 12 MG tablet Take 12 mg by mouth 2 (two) times daily with a meal. 09/02/19  Yes [provider]  lisinopril (PRINIVIL,ZESTRIL) 10 MG tablet Take 1 tablet by mouth once daily Patient taking differently: Take 10 mg by mouth daily. 06/10/18  Yes Runell GessBerry, Jonathan J, MD  memantine (NAMENDA) 10 MG tablet Take 10 mg by mouth 2 (two) times daily. 08/07/19  Yes [provider]  metFORMIN (GLUCOPHAGE) 500 MG tablet Take 1 tablet by mouth 2 (two) times daily. 06/23/20  Yes [provider]  metoprolol tartrate (LOPRESSOR) 25 MG tablet Take 1 tablet by mouth twice daily 06/28/20  Yes Runell GessBerry, Jonathan J, MD  omeprazole (PRILOSEC) 20 MG capsule Take 20 mg by mouth daily.   Yes [provider]  vitamin B-12 (CYANOCOBALAMIN) 1000 MCG tablet Take 1,000 mcg by mouth daily.   Yes [provider]  nitroGLYCERIN (NITROSTAT) 0.4 MG SL tablet Place 1 tablet (0.4 mg total) under the tongue every 5 (five) minutes as needed for chest pain. 02/15/16   Janetta Horahompson, Kathryn R, PA-C    Physical Exam: Vitals:   07/26/20 2200 07/26/20 2300 07/27/20 0025 07/27/20 0030  BP: 130/70 133/81 133/81 116/75  Pulse: (!) 111 (!) 117 (!) 123 (!) 121  Resp: (!) 22 (!) 30  16  Temp:   (!) 102.9 F (39.4 C)   TempSrc:   Axillary   SpO2: 98% 97% 96% 93%  Weight:      Height:          Constitutional: Obtunded, not communicating adequately Vitals:   07/26/20 2200 07/26/20 2300 07/27/20 0025 07/27/20 0030  BP: 130/70 133/81 133/81 116/75  Pulse: (!) 111 (!) 117 (!) 123 (!) 121  Resp: (!) 22 (!) 30  16  Temp:   (!) 102.9 F (39.4 C)   TempSrc:   Axillary   SpO2: 98% 97% 96% 93%  Weight:      Height:       Eyes: PERRL, lids and  conjunctivae normal ENMT: Mucous membranes are dry. Posterior pharynx clear of any exudate or lesions.Normal dentition.  Neck: normal, supple, no masses, no thyromegaly Respiratory: clear to auscultation bilaterally, no wheezing, no crackles. Normal respiratory effort. No accessory muscle use.  Cardiovascular: Sinus tachycardia no murmurs / rubs / gallops. No extremity edema. 2+ pedal pulses. No carotid bruits.  Abdomen: no tenderness, mild distention no masses palpated. No hepatosplenomegaly. Bowel sounds positive.  Musculoskeletal: no clubbing / cyanosis. No joint deformity upper and lower extremities. Good ROM, no contractures. Normal muscle tone.  Skin: no rashes, lesions, ulcers. No induration Neurologic: CN 2-12 grossly intact. Sensation intact, DTR normal. Strength 5/5 in all 4.  Psychiatric: Confused, mild agitation: Normal mood.     Labs on Admission: I have personally reviewed following labs  and imaging studies  CBC: Recent Labs  Lab 07/26/20 1930  WBC 14.6*  NEUTROABS 12.7*  HGB 13.2  HCT 40.7  MCV 88.9  PLT 337   Basic Metabolic Panel: Recent Labs  Lab 07/26/20 1930  NA 138  K 4.7  CL 97*  CO2 26  GLUCOSE 197*  BUN 24*  CREATININE 1.28*  CALCIUM 9.3   GFR: Estimated Creatinine Clearance: 62.1 mL/min (A) (by C-G formula based on SCr of 1.28 mg/dL (H)). Liver Function Tests: Recent Labs  Lab 07/26/20 1930  AST 28  ALT 17  ALKPHOS 106  BILITOT 0.7  PROT 8.9*  ALBUMIN 4.2   No results for input(s): LIPASE, AMYLASE in the last 168 hours. No results for input(s): AMMONIA in the last 168 hours. Coagulation Profile: Recent Labs  Lab 07/26/20 1930  INR 1.0   Cardiac Enzymes: No results for input(s): CKTOTAL, CKMB, CKMBINDEX, TROPONINI in the last 168 hours. BNP (last 3 results) No results for input(s): PROBNP in the last 8760 hours. HbA1C: No results for input(s): HGBA1C in the last 72 hours. CBG: No results for input(s): GLUCAP in the last 168  hours. Lipid Profile: No results for input(s): CHOL, HDL, LDLCALC, TRIG, CHOLHDL, LDLDIRECT in the last 72 hours. Thyroid Function Tests: No results for input(s): TSH, T4TOTAL, FREET4, T3FREE, THYROIDAB in the last 72 hours. Anemia Panel: No results for input(s): VITAMINB12, FOLATE, FERRITIN, TIBC, IRON, RETICCTPCT in the last 72 hours. Urine analysis:    Component Value Date/Time   COLORURINE AMBER (A) 07/26/2020 1939   APPEARANCEUR HAZY (A) 07/26/2020 1939   APPEARANCEUR Clear 10/16/2014 1539   LABSPEC 1.028 07/26/2020 1939   PHURINE 5.0 07/26/2020 1939   GLUCOSEU NEGATIVE 07/26/2020 1939   HGBUR NEGATIVE 07/26/2020 1939   BILIRUBINUR NEGATIVE 07/26/2020 1939   BILIRUBINUR Negative 10/16/2014 1539   KETONESUR NEGATIVE 07/26/2020 1939   PROTEINUR NEGATIVE 07/26/2020 1939   NITRITE NEGATIVE 07/26/2020 1939   LEUKOCYTESUR NEGATIVE 07/26/2020 1939   Sepsis Labs: @LABRCNTIP (procalcitonin:4,lacticidven:4) ) Recent Results (from the past 240 hour(s))  Resp Panel by RT-PCR (Flu A&B, Covid) Nasopharyngeal Swab     Status: None   Collection Time: 07/26/20  7:39 PM   Specimen: Nasopharyngeal Swab; Nasopharyngeal(NP) swabs in vial transport medium  Result Value Ref Range Status   SARS Coronavirus 2 by RT PCR NEGATIVE NEGATIVE Final    Comment: (NOTE) SARS-CoV-2 target nucleic acids are NOT DETECTED.  The SARS-CoV-2 RNA is generally detectable in upper respiratory specimens during the acute phase of infection. The lowest concentration of SARS-CoV-2 viral copies this assay can detect is 138 copies/mL. A negative result does not preclude SARS-Cov-2 infection and should not be used as the sole basis for treatment or other patient management decisions. A negative result may occur with  improper specimen collection/handling, submission of specimen other than nasopharyngeal swab, presence of viral mutation(s) within the areas targeted by this assay, and inadequate number of  viral copies(<138 copies/mL). A negative result must be combined with clinical observations, patient history, and epidemiological information. The expected result is Negative.  Fact Sheet for Patients:  09/25/20  Fact Sheet for Healthcare Providers:  BloggerCourse.com  This test is no t yet approved or cleared by the [place] FDA and  has been authorized for detection and/or diagnosis of SARS-CoV-2 by FDA under an Emergency Use Authorization (EUA). This EUA will remain  in effect (meaning this test can be used) for the duration of the COVID-19 declaration under Section 564(b)(1) of the Act, 21  U.S.C.section 360bbb-3(b)(1), unless the authorization is terminated  or revoked sooner.       Influenza A by PCR NEGATIVE NEGATIVE Final   Influenza B by PCR NEGATIVE NEGATIVE Final    Comment: (NOTE) The Xpert Xpress SARS-CoV-2/FLU/RSV plus assay is intended as an aid in the diagnosis of influenza from Nasopharyngeal swab specimens and should not be used as a sole basis for treatment. Nasal washings and aspirates are unacceptable for Xpert Xpress SARS-CoV-2/FLU/RSV testing.  Fact Sheet for Patients: BloggerCourse.com  Fact Sheet for Healthcare Providers: SeriousBroker.it  This test is not yet approved or cleared by the [place] FDA and has been authorized for detection and/or diagnosis of SARS-CoV-2 by FDA under an Emergency Use Authorization (EUA). This EUA will remain in effect (meaning this test can be used) for the duration of the COVID-19 declaration under Section 564(b)(1) of the Act, 21 U.S.C. section 360bbb-3(b)(1), unless the authorization is terminated or revoked.  Performed at [facility], [address], [place], [place] [address]      Radiological Exams on Admission: CT CHEST ABDOMEN PELVIS W CONTRAST  Result Date: 07/26/2020 CLINICAL  DATA:  75 year old male with abdominal pain. EXAM: CT CHEST, ABDOMEN, AND PELVIS WITH CONTRAST TECHNIQUE: Multidetector CT imaging of the chest, abdomen and pelvis was performed following the standard protocol during bolus administration of intravenous contrast. CONTRAST:  OMNIPAQUE IOHEXOL 300 MG/ML  SOLN COMPARISON:  CT abdomen pelvis dated 01/04/2016. FINDINGS: CT CHEST FINDINGS Cardiovascular: Top-normal cardiac size. No pericardial effusion. Advanced 3 vessel coronary vascular calcification. There is moderate atherosclerotic calcification of the thoracic aorta. No aneurysmal dilatation or dissection. The origins of the great vessels of the aortic arch appear patent. The central pulmonary arteries are unremarkable. Mediastinum/Nodes: The no hilar or mediastinal adenopathy. Small hiatal hernia. The esophagus and the thyroid gland are grossly unremarkable. No mediastinal fluid collection. Lungs/Pleura: There are bibasilar linear atelectasis/scarring. No focal consolidation, pleural effusion, or pneumothorax. The the central airways are patent. Musculoskeletal: Degenerative changes of the spine. No acute osseous pathology. CT ABDOMEN PELVIS FINDINGS No intra-abdominal free air or free fluid. Hepatobiliary: Probable fatty liver. Subcentimeter left hepatic hypodense focus is too small to characterize. No intrahepatic biliary dilatation. Small stones in the gallbladder. No pericholecystic fluid or evidence of acute cholecystitis by CT. Pancreas: Unremarkable. No pancreatic ductal dilatation or surrounding inflammatory changes. Spleen: Normal in size without focal abnormality. Adrenals/Urinary Tract: The adrenal glands unremarkable. There is no hydronephrosis on either side. There is symmetric enhancement and excretion of contrast by both kidneys. The visualized ureters and urinary bladder appear unremarkable. Stomach/Bowel: There is fluid-filled loops of small bowel throughout the abdomen likely representing  enteritis. There is mild dilatation of proximal small bowel loops measuring up to 4 cm, likely ileus. There is loose stool throughout the colon compatible with diarrheal state. No definite evidence of obstruction. The appendix is normal. Vascular/Lymphatic: Advanced aortoiliac atherosclerotic disease. The IVC is unremarkable. No portal venous gas. There is no adenopathy. Reproductive: The prostate and seminal vesicles are grossly unremarkable. No pelvic mass. Other: Small fat containing umbilical hernia. Musculoskeletal: Degenerative changes of the spine and osteopenia. No acute osseous pathology. IMPRESSION: 1. Enteritis with diarrheal state. Correlation with clinical exam and stool cultures recommended. No bowel obstruction. Normal appendix. 2. Cholelithiasis. 3. Aortic Atherosclerosis (ICD10-I70.0). Electronically Signed   By: Elgie Collard M.D.   On: 07/26/2020 22:53   DG Chest Port 1 View  Result Date: 07/26/2020 CLINICAL DATA:  75 year old male with sepsis. EXAM: PORTABLE CHEST  1 VIEW COMPARISON:  Chest radiograph dated 10/16/2019. FINDINGS: There is shallow inspiration. Diffuse interstitial and vascular prominence as seen previously may represent vascular congestion and edema. Faint left mid lung field densities, likely atelectasis and vascular crowding. No focal consolidation, pleural effusion or pneumothorax. Stable cardiomegaly. Atherosclerotic calcification of the aorta. No acute osseous pathology. IMPRESSION: Vascular congestion and edema. No focal consolidation. Electronically Signed   By: Elgie Collard M.D.   On: 07/26/2020 20:21      Assessment/Plan Principal Problem:   Sepsis (HCC) Active Problems:   CAD (coronary artery disease)   Ischemic cardiomyopathy   Alzheimer's dementia without behavioral disturbance (HCC)   Acute metabolic encephalopathy     #1 Sepsis: Most likely due to intra-abdominal infection.  Patient will be admitted and initiated on sepsis protocol.  He has  low EF and we will be careful about fluids.  Continue antibiotics.  Stool studies ordered and will be followed.  #2 nausea vomiting and diarrhea: Acute enteritis.  Continue with antibiotics and stool studies.  Monitor closely  #3 ischemic cardiomyopathy: Continue home regimen.  #4 Alzheimer's dementia: Continue home regimen  #5 diabetes: Continue SSI's  #6 coronary artery disease: Stable at baseline   DVT prophylaxis: Lovenox Code Status: Full code Family Communication: No family at bedside Disposition Plan: Home Consults called: None Admission status: Inpatient  Severity of Illness: The appropriate patient status for this patient is INPATIENT. Inpatient status is judged to be reasonable and necessary in order to provide the required intensity of service to ensure the patient's safety. The patient's presenting symptoms, physical exam findings, and initial radiographic and laboratory data in the context of their chronic comorbidities is felt to place them at high risk for further clinical deterioration. Furthermore, it is not anticipated that the patient will be medically stable for discharge from the hospital within 2 midnights of admission. The following factors support the patient status of inpatient.   " The patient's presenting symptoms include nausea vomiting and diarrhea. " The worrisome physical exam findings include dementia with worsening altered mental status. " The initial radiographic and laboratory data are worrisome because of evidence of sepsis. " The chronic co-morbidities include dementia.   * I certify that at the point of admission it is my clinical judgment that the patient will require inpatient hospital care spanning beyond 2 midnights from the point of admission due to high intensity of service, high risk for further deterioration and high frequency of surveillance required.Lonia Blood MD [facility] Pager [phone]  If 7PM-7AM, please  contact night-coverage [email or web address] Password [facility]  07/27/2020, 12:47 AM

## 2020-07-27 NOTE — Progress Notes (Signed)
Patient ID: Jeff Key, male   DOB: 07-May-1945, 75 y.o.   MRN: [id number]  Pt tested positive for ROTAVIRUS--d/c IV abxs, IV fluids and cont to monitor

## 2020-07-27 NOTE — ED Notes (Signed)
Pts brief changed and temperature taken.

## 2020-07-27 NOTE — Progress Notes (Signed)
Secure chat message to Dr. Mikeal Hawthorne who quickly responded and ordered additional lactic acid

## 2020-07-27 NOTE — ED Provider Notes (Signed)
[facility] Emergency Department Provider Note  ____________________________________________  Time seen: Approximately 12:25 AM  I have reviewed the triage vital signs and the nursing notes.   HISTORY  Chief Complaint Nausea (fe) and Fever    Level 5 Caveat: Portions of the History and Physical including HPI and review of systems are unable to be completely obtained due to patient chronic dementia and altered mental status  HPI Jeff Key is a 75 y.o. male with a history of CAD dementia GERD  is brought to the ED due to vomiting and diarrhea since this morning.  Also noted to have oxygen saturation of 85% on room air.  Patient is reportedly at his baseline mental status which includes being nonverbal due to advanced dementia.     Past Medical History:  Diagnosis Date  . Anemia ~ 11/2015  . CAD (coronary artery disease)    a. inf STEMI on 02/10/16. RCA treated with DES followed by staged PCI of LAD and Circumflex 02/14/16.  Marland Kitchen Dementia (HCC) dx'd 01/2016  . GERD (gastroesophageal reflux disease)   . History of stomach ulcers 1980s?  . Ischemic cardiomyopathy    a. 01/2016: EF 40-45%.      Patient Active Problem List   Diagnosis Date Noted  . Acute on chronic systolic CHF (congestive heart failure) (HCC)   . Goals of care, counseling/discussion   . Palliative care by specialist   . CAP (community acquired pneumonia) 10/14/2019  . Acute metabolic encephalopathy 10/14/2019  . Ulcerative colitis (HCC) 10/14/2019  . Acute respiratory failure with hypoxia (HCC) 10/14/2019  . Severe sepsis (HCC) 10/14/2019  . Hyperlipidemia 06/15/2017  . Alzheimer's dementia without behavioral disturbance (HCC) 12/11/2016  . Sepsis (HCC) 03/10/2016  . 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 NSVT (nonsustained ventricular tachycardia) (HCC) 02/15/2016  . CAD (coronary artery disease)   . Ischemic cardiomyopathy   . STEMI involving oth coronary  artery of inferior wall (HCC) 02/10/2016  . Abnormal urinalysis 10/16/2014     Past Surgical History:  Procedure Laterality Date  . CARDIAC CATHETERIZATION N/A 02/10/2016   Procedure: Left Heart Cath and Coronary Angiography;  Surgeon: Runell Gess, MD;  Location: [facility] INVASIVE CV LAB;  Service: Cardiovascular;  Laterality: N/A;  . CARDIAC CATHETERIZATION N/A 02/10/2016   Procedure: Coronary Stent Intervention;  Surgeon: Runell Gess, MD;  Location: [facility] INVASIVE CV LAB;  Service: Cardiovascular;  Laterality: N/A;  . CARDIAC CATHETERIZATION N/A 02/14/2016   Procedure: Coronary Stent Intervention-LAD and CFX;  Surgeon: Runell Gess, MD;  Location: [facility] INVASIVE CV LAB;  Service: Cardiovascular;  Laterality: N/A;  . COLONOSCOPY WITH PROPOFOL N/A 05/06/2015   Procedure: COLONOSCOPY WITH PROPOFOL;  Surgeon: Wallace Cullens, MD;  Location: [facility] ENDOSCOPY;  Service: Gastroenterology;  Laterality: N/A;  . CORONARY ANGIOPLASTY WITH STENT PLACEMENT  02/14/2016   "2 stents"  . ESOPHAGOGASTRODUODENOSCOPY (EGD) WITH PROPOFOL N/A 05/06/2015   Procedure: ESOPHAGOGASTRODUODENOSCOPY (EGD) WITH PROPOFOL;  Surgeon: Wallace Cullens, MD;  Location: [facility] ENDOSCOPY;  Service: Gastroenterology;  Laterality: N/A;  . FOREARM FRACTURE SURGERY Right 1985   "got it tore up in [place]"  . FRACTURE SURGERY       Prior to Admission medications   Medication Sig Start Date End Date Taking? Authorizing Provider  aspirin EC 81 MG tablet Take 81 mg by mouth daily.   Yes [provider]  atorvastatin (LIPITOR) 20 MG tablet Take 1 tablet by mouth once daily 05/20/20  Yes Runell Gess, MD  balsalazide (COLAZAL) 750 MG capsule Take 2,250 mg by mouth 3 (three) times daily. 09/23/19  Yes [provider]  clopidogrel (PLAVIX) 75 MG tablet Take 1 tablet (75 mg total) by mouth daily. 06/15/17  Yes Runell Gess, MD  escitalopram (LEXAPRO) 5 MG tablet Take 10 mg by mouth daily. 08/29/19  Yes [provider]   ferrous sulfate 325 (65 FE) MG tablet Take 325 mg by mouth daily with breakfast.   Yes [provider]  galantamine (RAZADYNE) 12 MG tablet Take 12 mg by mouth 2 (two) times daily with a meal. 09/02/19  Yes [provider]  lisinopril (PRINIVIL,ZESTRIL) 10 MG tablet Take 1 tablet by mouth once daily Patient taking differently: Take 10 mg by mouth daily. 06/10/18  Yes Runell Gess, MD  memantine (NAMENDA) 10 MG tablet Take 10 mg by mouth 2 (two) times daily. 08/07/19  Yes [provider]  metFORMIN (GLUCOPHAGE) 500 MG tablet Take 1 tablet by mouth 2 (two) times daily. 06/23/20  Yes [provider]  metoprolol tartrate (LOPRESSOR) 25 MG tablet Take 1 tablet by mouth twice daily 06/28/20  Yes Runell Gess, MD  omeprazole (PRILOSEC) 20 MG capsule Take 20 mg by mouth daily.   Yes [provider]  vitamin B-12 (CYANOCOBALAMIN) 1000 MCG tablet Take 1,000 mcg by mouth daily.   Yes [provider]  nitroGLYCERIN (NITROSTAT) 0.4 MG SL tablet Place 1 tablet (0.4 mg total) under the tongue every 5 (five) minutes as needed for chest pain. 02/15/16   Janetta Hora, PA-C     Allergies Patient has no known allergies.   Family History  Problem Relation Age of Onset  . Prostate cancer Father   . Bladder Cancer Neg Hx   . Kidney cancer Neg Hx     Social History Social History   Tobacco Use  . Smoking status: Former Smoker    Packs/day: 2.00    Years: 30.00    Pack years: 60.00    Types: Cigarettes    Quit date: 1990    Years since quitting: 32.3  . Smokeless tobacco: Never Used  Substance Use Topics  . Alcohol use: No    Alcohol/week: 0.0 standard drinks  . Drug use: No    Review of Systems Level 5 Caveat: Portions of the History and Physical including HPI and review of systems are unable to be completely obtained due to patient being a poor historian   Constitutional:   No known fever.  ENT:   No  rhinorrhea. Cardiovascular:   No chest pain or syncope. Respiratory:   No dyspnea or cough. Gastrointestinal:   Positive for abdominal pain, vomiting and diarrhea.  Musculoskeletal:   Negative for focal pain or swelling ____________________________________________   PHYSICAL EXAM:  VITAL SIGNS: ED Triage Vitals [07/26/20 1955]  Enc Vitals Group     BP 119/76     Pulse Rate (!) 129     Resp 20     Temp (!) 101.6 F (38.7 C)     Temp Source Rectal     SpO2 94 %     Weight 235 lb 14.3 oz (107 kg)     Height 5\' 11"  (1.803 m)     Head Circumference      Peak Flow      Pain Score      Pain Loc      Pain Edu?      Excl. in GC?     Vital signs reviewed,  nursing assessments reviewed.   Constitutional:   Awake, not oriented.  Ill-appearing. Eyes:   Conjunctivae are normal. EOMI. PERRL. ENT      Head:   Normocephalic and atraumatic.      Nose:   No congestion/rhinnorhea.       Mouth/Throat:   Dry mucous membranes, no pharyngeal erythema. No peritonsillar mass.       Neck:   No meningismus. Full ROM. Hematological/Lymphatic/Immunilogical:   No cervical lymphadenopathy. Cardiovascular:   Tachycardia heart rate 125. Symmetric bilateral radial and DP pulses.  No murmurs. Cap refill less than 2 seconds. Respiratory:   Normal respiratory effort without tachypnea/retractions. Breath sounds are clear and equal bilaterally. No wheezes/rales/rhonchi. Gastrointestinal:   Soft with generalized tenderness and moderate distention.. There is no CVA tenderness.  No rebound, rigidity, or guarding. Genitourinary:   deferred Musculoskeletal:   Normal range of motion in all extremities. No joint effusions.  No lower extremity tenderness.  No edema. Neurologic:   Nonverbal.  Not following commands. Motor grossly intact.  Skin:    Skin is warm, dry and intact. No rash noted.  No petechiae, purpura, or bullae.  ____________________________________________    LABS (pertinent  positives/negatives) (all labs ordered are listed, but only abnormal results are displayed) Labs Reviewed  LACTIC ACID, PLASMA - Abnormal; Notable for the following components:      Result Value   Lactic Acid, Venous 3.9 (*)    All other components within normal limits  LACTIC ACID, PLASMA - Abnormal; Notable for the following components:   Lactic Acid, Venous 3.4 (*)    All other components within normal limits  COMPREHENSIVE METABOLIC PANEL - Abnormal; Notable for the following components:   Chloride 97 (*)    Glucose, Bld 197 (*)    BUN 24 (*)    Creatinine, Ser 1.28 (*)    Total Protein 8.9 (*)    GFR, Estimated 58 (*)    All other components within normal limits  CBC WITH DIFFERENTIAL/PLATELET - Abnormal; Notable for the following components:   WBC 14.6 (*)    Neutro Abs 12.7 (*)    Abs Immature Granulocytes 0.16 (*)    All other components within normal limits  URINALYSIS, COMPLETE (UACMP) WITH MICROSCOPIC - Abnormal; Notable for the following components:   Color, Urine AMBER (*)    APPearance HAZY (*)    Bacteria, UA RARE (*)    All other components within normal limits  RESP PANEL BY RT-PCR (FLU A&B, COVID) ARPGX2  CULTURE, BLOOD (ROUTINE X 2)  CULTURE, BLOOD (ROUTINE X 2)  URINE CULTURE  GASTROINTESTINAL PANEL BY PCR, STOOL (REPLACES STOOL CULTURE)  C DIFFICILE QUICK SCREEN W PCR REFLEX  PROTIME-INR  APTT   ____________________________________________   EKG  Interpreted by me Sinus tachycardia rate 129.  Normal axis and intervals.  Normal QRS ST segments and T waves.  ____________________________________________    RADIOLOGY  CT CHEST ABDOMEN PELVIS W CONTRAST  Result Date: 07/26/2020 CLINICAL DATA:  75 year old male with abdominal pain. EXAM: CT CHEST, ABDOMEN, AND PELVIS WITH CONTRAST TECHNIQUE: Multidetector CT imaging of the chest, abdomen and pelvis was performed following the standard protocol during bolus administration of intravenous contrast.  CONTRAST:  OMNIPAQUE IOHEXOL 300 MG/ML  SOLN COMPARISON:  CT abdomen pelvis dated 01/04/2016. FINDINGS: CT CHEST FINDINGS Cardiovascular: Top-normal cardiac size. No pericardial effusion. Advanced 3 vessel coronary vascular calcification. There is moderate atherosclerotic calcification of the thoracic aorta. No aneurysmal dilatation or dissection. The origins of the great vessels  of the aortic arch appear patent. The central pulmonary arteries are unremarkable. Mediastinum/Nodes: The no hilar or mediastinal adenopathy. Small hiatal hernia. The esophagus and the thyroid gland are grossly unremarkable. No mediastinal fluid collection. Lungs/Pleura: There are bibasilar linear atelectasis/scarring. No focal consolidation, pleural effusion, or pneumothorax. The the central airways are patent. Musculoskeletal: Degenerative changes of the spine. No acute osseous pathology. CT ABDOMEN PELVIS FINDINGS No intra-abdominal free air or free fluid. Hepatobiliary: Probable fatty liver. Subcentimeter left hepatic hypodense focus is too small to characterize. No intrahepatic biliary dilatation. Small stones in the gallbladder. No pericholecystic fluid or evidence of acute cholecystitis by CT. Pancreas: Unremarkable. No pancreatic ductal dilatation or surrounding inflammatory changes. Spleen: Normal in size without focal abnormality. Adrenals/Urinary Tract: The adrenal glands unremarkable. There is no hydronephrosis on either side. There is symmetric enhancement and excretion of contrast by both kidneys. The visualized ureters and urinary bladder appear unremarkable. Stomach/Bowel: There is fluid-filled loops of small bowel throughout the abdomen likely representing enteritis. There is mild dilatation of proximal small bowel loops measuring up to 4 cm, likely ileus. There is loose stool throughout the colon compatible with diarrheal state. No definite evidence of obstruction. The appendix is normal. Vascular/Lymphatic: Advanced  aortoiliac atherosclerotic disease. The IVC is unremarkable. No portal venous gas. There is no adenopathy. Reproductive: The prostate and seminal vesicles are grossly unremarkable. No pelvic mass. Other: Small fat containing umbilical hernia. Musculoskeletal: Degenerative changes of the spine and osteopenia. No acute osseous pathology. IMPRESSION: 1. Enteritis with diarrheal state. Correlation with clinical exam and stool cultures recommended. No bowel obstruction. Normal appendix. 2. Cholelithiasis. 3. Aortic Atherosclerosis (ICD10-I70.0). Electronically Signed   By: Elgie Collard M.D.   On: 07/26/2020 22:53   DG Chest Port 1 View  Result Date: 07/26/2020 CLINICAL DATA:  75 year old male with sepsis. EXAM: PORTABLE CHEST 1 VIEW COMPARISON:  Chest radiograph dated 10/16/2019. FINDINGS: There is shallow inspiration. Diffuse interstitial and vascular prominence as seen previously may represent vascular congestion and edema. Faint left mid lung field densities, likely atelectasis and vascular crowding. No focal consolidation, pleural effusion or pneumothorax. Stable cardiomegaly. Atherosclerotic calcification of the aorta. No acute osseous pathology. IMPRESSION: Vascular congestion and edema. No focal consolidation. Electronically Signed   By: Elgie Collard M.D.   On: 07/26/2020 20:21    ____________________________________________   PROCEDURES .Critical Care Performed by: Sharman Cheek, MD Authorized by: Sharman Cheek, MD   Critical care provider statement:    Critical care time (minutes):  40   Critical care time was exclusive of:  Separately billable procedures and treating other patients   Critical care was necessary to treat or prevent imminent or life-threatening deterioration of the following conditions:  Sepsis   Critical care was time spent personally by me on the following activities:  Development of treatment plan with patient or surrogate, discussions with consultants,  evaluation of patient's response to treatment, examination of patient, obtaining history from patient or surrogate, ordering and performing treatments and interventions, ordering and review of laboratory studies, ordering and review of radiographic studies, pulse oximetry, re-evaluation of patient's condition and review of old charts    ____________________________________________  DIFFERENTIAL DIAGNOSIS   Pneumonia, pulmonary edema, pleural effusion, bowel obstruction, bowel perforation, diverticulitis, intra-abdominal abscess, viral illness, COVID  CLINICAL IMPRESSION / ASSESSMENT AND PLAN / ED COURSE  Medications ordered in the ED: Medications  lactated ringers infusion ( Intravenous New Bag/Given 07/26/20 2255)  cefTRIAXone (ROCEPHIN) 2 g in sodium chloride 0.9 % 100 mL IVPB (0 g  Intravenous Stopped 07/26/20 2040)  azithromycin (ZITHROMAX) 500 mg in sodium chloride 0.9 % 250 mL IVPB (0 mg Intravenous Stopped 07/26/20 2105)  metroNIDAZOLE (FLAGYL) IVPB 500 mg (has no administration in time range)  sodium chloride 0.9 % bolus 1,000 mL (0 mLs Intravenous Stopped 07/26/20 2139)  ondansetron (ZOFRAN) injection 4 mg (4 mg Intravenous Given 07/26/20 2039)  iohexol (OMNIPAQUE) 300 MG/ML solution 100 mL (100 mLs Intravenous Contrast Given 07/26/20 2230)    Pertinent labs & imaging results that were available during my care of the patient were reviewed by me and considered in my medical decision making (see chart for details).   Lillia DallasRoger H Pion was evaluated in Emergency Department on 07/27/2020 for the symptoms described in the history of present illness. He was evaluated in the context of the global COVID-19 pandemic, which necessitated consideration that the patient might be at risk for infection with the SARS-CoV-2 virus that causes COVID-19. Institutional protocols and algorithms that pertain to the evaluation of patients at risk for COVID-19 are in a state of rapid change based on information released  by regulatory bodies including the CDC and federal and state organizations. These policies and algorithms were followed during the patient's care in the ED.   Patient presents with vomiting and diarrhea, concerning abdominal exam.  Also febrile tachycardic and hypoxic.  Sepsis work-up initiated, empiric ceftriaxone and azithromycin given for suspected pneumonia.  Work-up showed elevated lactate at 3.9.  Also has a leukocytosis of 14.6.  Other labs are unremarkable.  COVID and flu negative.  Blood pressure remains normal.  No signs of septic shock.  Does remain substantially febrile so have ordered him a dose of IV Tylenol, ordered stool studies for GI panel and C. Difficile.  CT scans show enteritis without other complications.  Due to sepsis and severe chronic comorbidities, will need to admit for further management.      ____________________________________________   FINAL CLINICAL IMPRESSION(S) / ED DIAGNOSES    Final diagnoses:  Shortness of breath  Generalized abdominal pain  Enteritis  Sepsis, due to unspecified organism, unspecified whether acute organ dysfunction present (HCC)  Acute on chronic systolic (congestive) heart failure (HCC)  Acute respiratory failure with hypoxia [facility](HCC)     ED Discharge Orders    None      Portions of this note were generated with dragon dictation software. Dictation errors may occur despite best attempts at proofreading.   Sharman CheekStafford, Kaylob Wallen, MD 07/27/20 [phone]

## 2020-07-27 NOTE — Progress Notes (Signed)
Pharmacy Antibiotic Note  EAGLE PITTA is a 75 y.o. male admitted on 07/26/2020 with intra abdominal infection.  Pharmacy has been consulted for Cefepime  dosing.  Plan: Ceftriaxone 2 gm IV X 1 given in ED on 5/9 @ 2000. Metronidazole 500 mg IV X 1 given in ED on 5/10 @ 0113.  Will order cefepime 2 gm IV Q8H to continue on 5/10 @ 0900.   Height: 5\' 11"  (180.3 cm) Weight: 107 kg (235 lb 14.3 oz) IBW/kg (Calculated) : 75.3  Temp (24hrs), Avg:102.3 F (39.1 C), Min:101.6 F (38.7 C), Max:102.9 F (39.4 C)  Recent Labs  Lab 07/26/20 1930 07/26/20 2300  WBC 14.6*  --   CREATININE 1.28*  --   LATICACIDVEN 3.9* 3.4*    Estimated Creatinine Clearance: 62.1 mL/min (A) (by C-G formula based on SCr of 1.28 mg/dL (H)).    No Known Allergies  Antimicrobials this admission:   >>    >>   Dose adjustments this admission:   Microbiology results:  BCx:   UCx:    Sputum:    MRSA PCR:   Thank you for allowing pharmacy to be a part of this patient's care.  Guinn Delarosa D 07/27/2020 1:43 AM

## 2020-07-27 NOTE — Progress Notes (Signed)
Anticoagulation monitoring(Lovenox):  75 yo male ordered Lovenox 40 mg Q24h  Filed Weights   07/26/20 1955 07/27/20 0440  Weight: 107 kg (235 lb 14.3 oz) 108.2 kg (238 lb 8.6 oz)   BMI 34   Lab Results  Component Value Date   CREATININE 1.28 (H) 07/26/2020   CREATININE 0.97 10/15/2019   CREATININE 1.04 10/14/2019   Estimated Creatinine Clearance: 61.4 mL/min (A) (by C-G formula based on SCr of 1.28 mg/dL (H)). Hemoglobin & Hematocrit     Component Value Date/Time   HGB 13.2 07/26/2020 1930   HCT 40.7 07/26/2020 1930     Per Protocol for Patient with estCrcl > 30 ml/min and BMI > 30, will transition to Lovenox 55 mg Q24h.    \

## 2020-07-27 NOTE — Progress Notes (Signed)
[facility]  - [place] [facility]   PATIENT NAME: Jeff Key    MR#:  [id number]  DATE OF BIRTH:  28-Dec-1945  SUBJECTIVE:  patient has dementia at baseline. Does not communicate much. Wife and daughter at bedside.  Was brought in with diarrhea nausea and vomiting. Was in contact with son-in-law who had viral gastroenteritis as well. You since admission to the ICU. Otherwise hemodynamically stable. No distress noted. Patient has been eating fairly well.  REVIEW OF SYSTEMS:   Review of Systems  Unable to perform ROS: Dementia   Tolerating Diet:yes Tolerating PT: per wife pt does not walk--uses WC most of the thime  DRUG ALLERGIES:  No Known Allergies  VITALS:  Blood pressure 94/71, pulse 73, temperature 98.4 F (36.9 C), temperature source Axillary, resp. rate 18, height 5\' 10"  (1.778 m), weight 108.2 kg, SpO2 96 %.  PHYSICAL EXAMINATION:   Physical Exam  GENERAL:  75 y.o.-year-old patient lying in the bed with no acute distress.obese  LUNGS: Normal breath sounds bilaterally, no wheezing, rales, rhonchi. No use of accessory muscles of respiration.  CARDIOVASCULAR: S1, S2 normal. No murmurs, rubs, or gallops.  ABDOMEN: Soft, nontender, nondistended. Bowel sounds present. No organomegaly or mass.  EXTREMITIES: No cyanosis, clubbing or edema b/l.    NEUROLOGIC: dementia at baseline PSYCHIATRIC:  patient is alert at baseline has dementia, does not communicate  SKIN: No obvious rash, lesion, or ulcer.   LABORATORY PANEL:  CBC Recent Labs  Lab 07/27/20 0819  WBC 13.0*  HGB 9.9*  HCT 31.4*  PLT 262    Chemistries  Recent Labs  Lab 07/27/20 0819  NA 136  K 3.9  CL 102  CO2 25  GLUCOSE 129*  BUN 25*  CREATININE 1.19  CALCIUM 7.9*  AST 22  ALT 12  ALKPHOS 60  BILITOT 0.5   Cardiac Enzymes No results for input(s): TROPONINI in the last 168 hours. RADIOLOGY:  CT CHEST ABDOMEN PELVIS W CONTRAST  Result Date: 07/26/2020 CLINICAL DATA:   75 year old male with abdominal pain. EXAM: CT CHEST, ABDOMEN, AND PELVIS WITH CONTRAST TECHNIQUE: Multidetector CT imaging of the chest, abdomen and pelvis was performed following the standard protocol during bolus administration of intravenous contrast. CONTRAST:  61 OMNIPAQUE IOHEXOL 300 MG/ML  SOLN COMPARISON:  CT abdomen pelvis dated 01/04/2016. FINDINGS: CT CHEST FINDINGS Cardiovascular: Top-normal cardiac size. No pericardial effusion. Advanced 3 vessel coronary vascular calcification. There is moderate atherosclerotic calcification of the thoracic aorta. No aneurysmal dilatation or dissection. The origins of the great vessels of the aortic arch appear patent. The central pulmonary arteries are unremarkable. Mediastinum/Nodes: The no hilar or mediastinal adenopathy. Small hiatal hernia. The esophagus and the thyroid gland are grossly unremarkable. No mediastinal fluid collection. Lungs/Pleura: There are bibasilar linear atelectasis/scarring. No focal consolidation, pleural effusion, or pneumothorax. The the central airways are patent. Musculoskeletal: Degenerative changes of the spine. No acute osseous pathology. CT ABDOMEN PELVIS FINDINGS No intra-abdominal free air or free fluid. Hepatobiliary: Probable fatty liver. Subcentimeter left hepatic hypodense focus is too small to characterize. No intrahepatic biliary dilatation. Small stones in the gallbladder. No pericholecystic fluid or evidence of acute cholecystitis by CT. Pancreas: Unremarkable. No pancreatic ductal dilatation or surrounding inflammatory changes. Spleen: Normal in size without focal abnormality. Adrenals/Urinary Tract: The adrenal glands unremarkable. There is no hydronephrosis on either side. There is symmetric enhancement and excretion of contrast by both kidneys. The visualized ureters and urinary bladder appear unremarkable. Stomach/Bowel: There is fluid-filled loops of small bowel  throughout the abdomen likely representing enteritis.  There is mild dilatation of proximal small bowel loops measuring up to 4 cm, likely ileus. There is loose stool throughout the colon compatible with diarrheal state. No definite evidence of obstruction. The appendix is normal. Vascular/Lymphatic: Advanced aortoiliac atherosclerotic disease. The IVC is unremarkable. No portal venous gas. There is no adenopathy. Reproductive: The prostate and seminal vesicles are grossly unremarkable. No pelvic mass. Other: Small fat containing umbilical hernia. Musculoskeletal: Degenerative changes of the spine and osteopenia. No acute osseous pathology. IMPRESSION: 1. Enteritis with diarrheal state. Correlation with clinical exam and stool cultures recommended. No bowel obstruction. Normal appendix. 2. Cholelithiasis. 3. Aortic Atherosclerosis (ICD10-I70.0). Electronically Signed   By: Elgie Collard M.D.   On: 07/26/2020 22:53   DG Chest Port 1 View  Result Date: 07/26/2020 CLINICAL DATA:  75 year old male with sepsis. EXAM: PORTABLE CHEST 1 VIEW COMPARISON:  Chest radiograph dated 10/16/2019. FINDINGS: There is shallow inspiration. Diffuse interstitial and vascular prominence as seen previously may represent vascular congestion and edema. Faint left mid lung field densities, likely atelectasis and vascular crowding. No focal consolidation, pleural effusion or pneumothorax. Stable cardiomegaly. Atherosclerotic calcification of the aorta. No acute osseous pathology. IMPRESSION: Vascular congestion and edema. No focal consolidation. Electronically Signed   By: Elgie Collard M.D.   On: 07/26/2020 20:21   ASSESSMENT AND PLAN:  Jeff Key is a 75 y.o. male with medical history significant of advanced dementia, diabetes, coronary artery disease with ischemic cardiomyopathy, GERD, history of stomach ulcers, GERD was brought into the ED with vomiting and diarrhea . Patient's work-up shows some enteritis and he meets sepsis criteria with temperature heart rate.  Sepsis  present on admission suspected due to enteritis appears likely due to viral enteritis. -- Patient has sick contact with son-in-law with similar symptoms -- diarrhea resolved. No nausea vomiting. Patient tolerating regular food -- stool samples not given since patient has no diarrhea. Will continue antibiotics for now if patient not able to give anymore stool sample or symptoms do not recurrent then discontinue antibiotic -- received IV hydration -- white count improving -- lactic acid trending down 3.9 >>2.7>>2.0 -- CT results reflect enteritis -- Pro calcitonin .2  History of ischemic cardiomyopathy CAD -- resume home meds  Alzheimer's dementia -- patient is dependent on all his ADLs. He is taken care of by wife at home. He does not ambulate uses wheelchair. He is able to transfer from bed to wheelchair --cont home meds  Type II diabetes continue sliding scale insulin for now  Procedures: Family communication : daughter and wife at bedside Consults : none CODE STATUS: full DVT Prophylaxis : Lovenox Level of care: Med-Surg Status is: Inpatient  Remains inpatient appropriate because:Inpatient level of care appropriate due to severity of illness   Dispo: The patient is from: Home              Anticipated d/c is to: Home              Patient currently is not medically stable to d/c.   Difficult to place patient No  Transfer out of ICU. If continues to show improvement discharge in 1 to 2 days.      TOTAL TIME TAKING CARE OF THIS PATIENT: 25 minutes.  >50% time spent on counselling and coordination of care  Note: This dictation was prepared with Dragon dictation along with smaller phrase technology. Any transcriptional errors that result from this process are unintentional.  Nani Skillern.D  [facility]   CC: Primary care physician; [name], MDPatient ID: Lillia Dallas, male   DOB: [birth year], 75 y.o.   MRN: [id number]

## 2020-07-27 NOTE — ED Notes (Signed)
Report provided to ICU for room 14. Writer to transport pt to ICU at this time.

## 2020-07-27 NOTE — ED Notes (Signed)
Pt axillary temp still 102/9 after tylenol infusion. NP Manuela Schwartz notified.

## 2020-07-28 ENCOUNTER — Inpatient Hospital Stay: Payer: PPO

## 2020-07-28 DIAGNOSIS — G9341 Metabolic encephalopathy: Secondary | ICD-10-CM

## 2020-07-28 LAB — LACTIC ACID, PLASMA
Lactic Acid, Venous: 1 mmol/L (ref 0.5–1.9)
Lactic Acid, Venous: 1 mmol/L (ref 0.5–1.9)

## 2020-07-28 LAB — URINE CULTURE: Culture: NO GROWTH

## 2020-07-28 LAB — GLUCOSE, CAPILLARY
Glucose-Capillary: 123 mg/dL — ABNORMAL HIGH (ref 70–99)
Glucose-Capillary: 125 mg/dL — ABNORMAL HIGH (ref 70–99)
Glucose-Capillary: 117 mg/dL — ABNORMAL HIGH (ref 70–99)
Glucose-Capillary: 126 mg/dL — ABNORMAL HIGH (ref 70–99)

## 2020-07-28 MED ORDER — PANTOPRAZOLE SODIUM 40 MG PO TBEC
40.0000 mg | DELAYED_RELEASE_TABLET | Freq: Every day | ORAL | Status: DC
  Administered 2020-07-29: 40 mg via ORAL
  Filled 2020-07-28: qty 1

## 2020-07-28 MED ORDER — PROCHLORPERAZINE EDISYLATE 10 MG/2ML IJ SOLN
10.0000 mg | Freq: Four times a day (QID) | INTRAMUSCULAR | Status: DC | PRN
  Filled 2020-07-28: qty 2

## 2020-07-28 NOTE — Progress Notes (Signed)
PHARMACIST - PHYSICIAN COMMUNICATION  DR: Marylu Lund  CONCERNING: IV to Oral Route Change Policy  RECOMMENDATION: This patient is receiving pantoprazole by the intravenous route.  Based on criteria approved by the Pharmacy and Therapeutics Committee, the intravenous medication(s) is/are being converted to the equivalent oral dose form(s).   DESCRIPTION: These criteria include:  The patient is eating (either orally or via tube) and/or has been taking other orally administered medications for a least 24 hours  The patient has no evidence of active gastrointestinal bleeding or impaired GI absorption (gastrectomy, short bowel, patient on TNA or NPO).  If you have questions about this conversion, please contact the Pharmacy Department  []   [phone] )  ( [phone] [[phone]]   [phone] )  [facility] []   [phone] )  [facility] []   [phone] )  [facility] []   [phone] )  [facility]   ( [phone], [facility] 07/28/2020 9:22 AM

## 2020-07-28 NOTE — Progress Notes (Signed)
PROGRESS NOTE    Lillia DallasRoger H Reetz  ZOX:[id number] DOB: [birth year] DOA: 07/26/2020 PCP: Barbette ReichmannHande, Vishwanath, MD    Brief Narrative:  patient has dementia at baseline. Does not communicate much.Was brought in with diarrhea nausea and vomiting. Was in contact with son-in-law who had viral gastroenteritis as well. +Rotavirus on GI panel  5/11-wife at bedside. Had one large episode of diarrhea this am. Tolerating feeding. Coughing.     Consultants:     Procedures:   Antimicrobials:       Subjective: Pt non verbal, quiet.   Objective: Vitals:   07/27/20 2330 07/28/20 0438 07/28/20 0800 07/28/20 1123  BP: 136/74 125/72 (!) 136/104 137/83  Pulse: 71 68 64 62  Resp: 18 18 17 17   Temp:  97.6 F (36.4 C) 97.6 F (36.4 C) 98.5 F (36.9 C)  TempSrc:  Oral  Oral  SpO2: 92% 94% 93% 93%  Weight:      Height:        Intake/Output Summary (Last 24 hours) at 07/28/2020 1325 Last data filed at 07/27/2020 1543 Gross per 24 hour  Intake 182.04 ml  Output --  Net 182.04 ml   Filed Weights   07/26/20 1955 07/27/20 0440  Weight: 107 kg 108.2 kg    Examination:  General exam: Appears calm and comfortable  Respiratory system: rhonchorus, no wheezing Cardiovascular system: S1 & S2 heard, RRR. No JVD, murmurs, rubs, gallops or clicks.  Gastrointestinal system: Abdomen is nondistended, soft and nontender. Normal bowel sounds heard. Central nervous system: Alert and awake. Unable to assess more Extremities: no edema Skin: warm, dry Psychiatry: . Mood & affect appropriate.     Data Reviewed: I have personally reviewed following labs and imaging studies  CBC: Recent Labs  Lab 07/26/20 1930 07/27/20 0819  WBC 14.6* 13.0*  NEUTROABS 12.7*  --   HGB 13.2 9.9*  HCT 40.7 31.4*  MCV 88.9 90.0  PLT 337 262   Basic Metabolic Panel: Recent Labs  Lab 07/26/20 1930 07/27/20 0819  NA 138 136  K 4.7 3.9  CL 97* 102  CO2 26 25  GLUCOSE 197* 129*  BUN 24* 25*  CREATININE  1.28* 1.19  CALCIUM 9.3 7.9*   GFR: Estimated Creatinine Clearance: 66.1 mL/min (by C-G formula based on SCr of 1.19 mg/dL). Liver Function Tests: Recent Labs  Lab 07/26/20 1930 07/27/20 0819  AST 28 22  ALT 17 12  ALKPHOS 106 60  BILITOT 0.7 0.5  PROT 8.9* 6.6  ALBUMIN 4.2 3.0*   No results for input(s): LIPASE, AMYLASE in the last 168 hours. No results for input(s): AMMONIA in the last 168 hours. Coagulation Profile: Recent Labs  Lab 07/26/20 1930 07/27/20 0819  INR 1.0 1.1   Cardiac Enzymes: No results for input(s): CKTOTAL, CKMB, CKMBINDEX, TROPONINI in the last 168 hours. BNP (last 3 results) No results for input(s): PROBNP in the last 8760 hours. HbA1C: Recent Labs    07/27/20 0819  HGBA1C 6.8*   CBG: Recent Labs  Lab 07/27/20 1136 07/27/20 1613 07/27/20 2155 07/28/20 0745 07/28/20 1124  GLUCAP 128* 114* 99 123* 125*   Lipid Profile: No results for input(s): CHOL, HDL, LDLCALC, TRIG, CHOLHDL, LDLDIRECT in the last 72 hours. Thyroid Function Tests: No results for input(s): TSH, T4TOTAL, FREET4, T3FREE, THYROIDAB in the last 72 hours. Anemia Panel: No results for input(s): VITAMINB12, FOLATE, FERRITIN, TIBC, IRON, RETICCTPCT in the last 72 hours. Sepsis Labs: Recent Labs  Lab 07/27/20 0819 07/27/20 1227 07/28/20 0816 07/28/20 1156  PROCALCITON 0.27  --   --   --   LATICACIDVEN 1.8 2.0* 1.0 1.0    Recent Results (from the past 240 hour(s))  Resp Panel by RT-PCR (Flu A&B, Covid) Nasopharyngeal Swab     Status: None   Collection Time: 07/26/20  7:39 PM   Specimen: Nasopharyngeal Swab; Nasopharyngeal(NP) swabs in vial transport medium  Result Value Ref Range Status   SARS Coronavirus 2 by RT PCR NEGATIVE NEGATIVE Final    Comment: (NOTE) SARS-CoV-2 target nucleic acids are NOT DETECTED.  The SARS-CoV-2 RNA is generally detectable in upper respiratory specimens during the acute phase of infection. The lowest concentration of SARS-CoV-2 viral  copies this assay can detect is 138 copies/mL. A negative result does not preclude SARS-Cov-2 infection and should not be used as the sole basis for treatment or other patient management decisions. A negative result may occur with  improper specimen collection/handling, submission of specimen other than nasopharyngeal swab, presence of viral mutation(s) within the areas targeted by this assay, and inadequate number of viral copies(<138 copies/mL). A negative result must be combined with clinical observations, patient history, and epidemiological information. The expected result is Negative.  Fact Sheet for Patients:  BloggerCourse.com  Fact Sheet for Healthcare Providers:  SeriousBroker.it  This test is no t yet approved or cleared by the [place] FDA and  has been authorized for detection and/or diagnosis of SARS-CoV-2 by FDA under an Emergency Use Authorization (EUA). This EUA will remain  in effect (meaning this test can be used) for the duration of the COVID-19 declaration under Section 564(b)(1) of the Act, 21 U.S.C.section 360bbb-3(b)(1), unless the authorization is terminated  or revoked sooner.       Influenza A by PCR NEGATIVE NEGATIVE Final   Influenza B by PCR NEGATIVE NEGATIVE Final    Comment: (NOTE) The Xpert Xpress SARS-CoV-2/FLU/RSV plus assay is intended as an aid in the diagnosis of influenza from Nasopharyngeal swab specimens and should not be used as a sole basis for treatment. Nasal washings and aspirates are unacceptable for Xpert Xpress SARS-CoV-2/FLU/RSV testing.  Fact Sheet for Patients: BloggerCourse.com  Fact Sheet for Healthcare Providers: SeriousBroker.it  This test is not yet approved or cleared by the [place] FDA and has been authorized for detection and/or diagnosis of SARS-CoV-2 by FDA under an Emergency Use Authorization (EUA). This  EUA will remain in effect (meaning this test can be used) for the duration of the COVID-19 declaration under Section 564(b)(1) of the Act, 21 U.S.C. section 360bbb-3(b)(1), unless the authorization is terminated or revoked.  Performed at [facility], [address] [place], [place] [address]   Blood Culture (routine x 2)     Status: None (Preliminary result)   Collection Time: 07/26/20  7:39 PM   Specimen: BLOOD  Result Value Ref Range Status   Specimen Description BLOOD BLOOD LEFT FOREARM  Final   Special Requests   Final    BOTTLES DRAWN AEROBIC AND ANAEROBIC Blood Culture results may not be optimal due to an inadequate volume of blood received in culture bottles   Culture   Final    NO GROWTH 2 DAYS Performed at [facility], [address] [place], [place] [address]    Report Status PENDING  Incomplete  Urine culture     Status: None   Collection Time: 07/26/20  7:39 PM   Specimen: In/Out Cath Urine  Result Value Ref Range Status   Specimen Description   Final  IN/OUT CATH URINE Performed at [facility], [address] [place] [address], [place], [place] [address]    Special Requests   Final    NONE Performed at [facility], [address], [place], [place] [address]    Culture   Final    NO GROWTH Performed at [facility], [address], [place], [place] [address]    Report Status 07/28/2020 FINAL  Final  Blood Culture (routine x 2)     Status: None (Preliminary result)   Collection Time: 07/26/20  7:44 PM   Specimen: BLOOD  Result Value Ref Range Status   Specimen Description BLOOD BLOOD LEFT ARM  Final   Special Requests   Final    BOTTLES DRAWN AEROBIC AND ANAEROBIC Blood Culture results may not be optimal due to an inadequate volume of blood received in culture bottles   Culture   Final    NO GROWTH 2 DAYS Performed at [facility], [address], [place], [place] [address]    Report Status PENDING  Incomplete   C Difficile Quick Screen w PCR reflex     Status: None   Collection Time: 07/27/20  1:00 PM   Specimen: STOOL  Result Value Ref Range Status   C Diff antigen NEGATIVE NEGATIVE Final   C Diff toxin NEGATIVE NEGATIVE Final   C Diff interpretation No C. difficile detected.  Final    Comment: Performed at [facility], [address] [place], [place] [address]  Gastrointestinal Panel by PCR , Stool     Status: Abnormal   Collection Time: 07/27/20  1:00 PM   Specimen: Stool  Result Value Ref Range Status   Campylobacter species NOT DETECTED NOT DETECTED Final   Plesimonas shigelloides NOT DETECTED NOT DETECTED Final   Salmonella species NOT DETECTED NOT DETECTED Final   Yersinia enterocolitica NOT DETECTED NOT DETECTED Final   Vibrio species NOT DETECTED NOT DETECTED Final   Vibrio cholerae NOT DETECTED NOT DETECTED Final   Enteroaggregative E coli (EAEC) NOT DETECTED NOT DETECTED Final   Enteropathogenic E coli (EPEC) NOT DETECTED NOT DETECTED Final   Enterotoxigenic E coli (ETEC) NOT DETECTED NOT DETECTED Final   Shiga like toxin producing E coli (STEC) NOT DETECTED NOT DETECTED Final   Shigella/Enteroinvasive E coli (EIEC) NOT DETECTED NOT DETECTED Final   Cryptosporidium NOT DETECTED NOT DETECTED Final   Cyclospora cayetanensis NOT DETECTED NOT DETECTED Final   Entamoeba histolytica NOT DETECTED NOT DETECTED Final   Giardia lamblia NOT DETECTED NOT DETECTED Final   Adenovirus F40/41 NOT DETECTED NOT DETECTED Final   Astrovirus NOT DETECTED NOT DETECTED Final   Norovirus GI/GII NOT DETECTED NOT DETECTED Final   Rotavirus A DETECTED (A) NOT DETECTED Final   Sapovirus (I, II, IV, and V) NOT DETECTED NOT DETECTED Final    Comment: Performed at [facility], [address], [place], [place] [address]         Radiology Studies: CT CHEST ABDOMEN PELVIS W CONTRAST  Result Date: 07/26/2020 CLINICAL DATA:  75 year old male with abdominal pain. EXAM: CT CHEST,  ABDOMEN, AND PELVIS WITH CONTRAST TECHNIQUE: Multidetector CT imaging of the chest, abdomen and pelvis was performed following the standard protocol during bolus administration of intravenous contrast. CONTRAST:  OMNIPAQUE IOHEXOL 300 MG/ML  SOLN COMPARISON:  CT abdomen pelvis dated 01/04/2016. FINDINGS: CT CHEST FINDINGS Cardiovascular: Top-normal cardiac size. No pericardial effusion. Advanced 3 vessel coronary vascular calcification. There is moderate atherosclerotic calcification of the thoracic aorta.  No aneurysmal dilatation or dissection. The origins of the great vessels of the aortic arch appear patent. The central pulmonary arteries are unremarkable. Mediastinum/Nodes: The no hilar or mediastinal adenopathy. Small hiatal hernia. The esophagus and the thyroid gland are grossly unremarkable. No mediastinal fluid collection. Lungs/Pleura: There are bibasilar linear atelectasis/scarring. No focal consolidation, pleural effusion, or pneumothorax. The the central airways are patent. Musculoskeletal: Degenerative changes of the spine. No acute osseous pathology. CT ABDOMEN PELVIS FINDINGS No intra-abdominal free air or free fluid. Hepatobiliary: Probable fatty liver. Subcentimeter left hepatic hypodense focus is too small to characterize. No intrahepatic biliary dilatation. Small stones in the gallbladder. No pericholecystic fluid or evidence of acute cholecystitis by CT. Pancreas: Unremarkable. No pancreatic ductal dilatation or surrounding inflammatory changes. Spleen: Normal in size without focal abnormality. Adrenals/Urinary Tract: The adrenal glands unremarkable. There is no hydronephrosis on either side. There is symmetric enhancement and excretion of contrast by both kidneys. The visualized ureters and urinary bladder appear unremarkable. Stomach/Bowel: There is fluid-filled loops of small bowel throughout the abdomen likely representing enteritis. There is mild dilatation of proximal small bowel  loops measuring up to 4 cm, likely ileus. There is loose stool throughout the colon compatible with diarrheal state. No definite evidence of obstruction. The appendix is normal. Vascular/Lymphatic: Advanced aortoiliac atherosclerotic disease. The IVC is unremarkable. No portal venous gas. There is no adenopathy. Reproductive: The prostate and seminal vesicles are grossly unremarkable. No pelvic mass. Other: Small fat containing umbilical hernia. Musculoskeletal: Degenerative changes of the spine and osteopenia. No acute osseous pathology. IMPRESSION: 1. Enteritis with diarrheal state. Correlation with clinical exam and stool cultures recommended. No bowel obstruction. Normal appendix. 2. Cholelithiasis. 3. Aortic Atherosclerosis (ICD10-I70.0). Electronically Signed   By: Elgie Collard M.D.   On: 07/26/2020 22:53   DG Chest Port 1 View  Result Date: 07/28/2020 CLINICAL DATA:  Rhonchi EXAM: PORTABLE CHEST 1 VIEW COMPARISON:  Chest radiograph and chest CT Jul 26, 2020 FINDINGS: There is mild atelectatic change in the lung bases. There is no edema or airspace opacity. Heart is mildly enlarged with pulmonary vascularity normal. No adenopathy. There is aortic atherosclerosis. There is degenerative change in each shoulder. IMPRESSION: Stable cardiac prominence. Bibasilar atelectasis. No edema or airspace opacity. Aortic Atherosclerosis (ICD10-I70.0). Electronically Signed   By: Bretta Bang III M.D.   On: 07/28/2020 11:32   DG Chest Port 1 View  Result Date: 07/26/2020 CLINICAL DATA:  75 year old male with sepsis. EXAM: PORTABLE CHEST 1 VIEW COMPARISON:  Chest radiograph dated 10/16/2019. FINDINGS: There is shallow inspiration. Diffuse interstitial and vascular prominence as seen previously may represent vascular congestion and edema. Faint left mid lung field densities, likely atelectasis and vascular crowding. No focal consolidation, pleural effusion or pneumothorax. Stable cardiomegaly. Atherosclerotic  calcification of the aorta. No acute osseous pathology. IMPRESSION: Vascular congestion and edema. No focal consolidation. Electronically Signed   By: Elgie Collard M.D.   On: 07/26/2020 20:21        Scheduled Meds: . aspirin EC  81 mg Oral Daily  . atorvastatin  20 mg Oral Daily  . balsalazide  2,250 mg Oral TID  . Chlorhexidine Gluconate Cloth  6 each Topical Daily  . clopidogrel  75 mg Oral Daily  . enoxaparin (LOVENOX) injection  0.5 mg/kg Subcutaneous Q24H  . escitalopram  10 mg Oral Daily  . ferrous sulfate  325 mg Oral Q breakfast  . galantamine  12 mg Oral BID WC  . insulin aspart  0-5 Units Subcutaneous QHS  .  insulin aspart  0-9 Units Subcutaneous TID WC  . lisinopril  10 mg Oral Daily  . memantine  10 mg Oral BID  . metoprolol tartrate  25 mg Oral BID  . [START ON 07/29/2020] pantoprazole  40 mg Oral Daily  . vitamin B-12  1,000 mcg Oral Daily   Continuous Infusions:  Assessment & Plan:   Principal Problem:   Sepsis (HCC) Active Problems:   CAD (coronary artery disease)   Ischemic cardiomyopathy   Alzheimer's dementia without behavioral disturbance (HCC)   Acute metabolic encephalopathy   Enteritis   Ileus (HCC)   Selso H Aldermanis a 75 y.o.malewith medical history significant ofadvanced dementia, diabetes, coronary artery disease with ischemic cardiomyopathy, GERD, history of stomach ulcers, GERD was brought into the ED with vomiting and diarrhea . Patient's work-up shows some enteritis and he meets sepsis criteria with temperature, heart rate.  Sepsis present on admission suspected due to enteritis appears likely due to Rotavirus enteritis. -- Patient has sick contact with son-in-law with similar symptoms -- 5/11-still with diarrhea, large amount this am. Tolerating po more. abx discontinued since viral Repeat lactic acid improved CT reflects enteritis. If diarrhea slows down and starts forming hopefully will DC in a.m. Encourage p.o.  hydration Check a.m. labs    History of ischemic cardiomyopathy CAD Continue ACE inhibitor, nitro sublingual, beta-blockers, aspirin, statin  Alzheimer's dementia -- patient is dependent on all his ADLs. He is taken care of by wife at home. He does not ambulate uses wheelchair. He is able to transfer from bed to wheelchair 5/11 no need for PT as wife declined Continue home meds  Type II diabetes  BG stable  A1c 6.8 Continue RISS   Coughing- on exam mildly rhonchorus. Instructed wife to have pt sit up while eating and post feeding up for 2 hours before lying down. Will obtain cxr.  DVT prophylaxis: Lovenox  Code Status: Full Family Communication: Wife at bedside   Status is: Inpatient  Remains inpatient appropriate because:Inpatient level of care appropriate due to severity of illness   Dispo: The patient is from: Home              Anticipated d/c is to: Home              Patient currently is not medically stable to d/c.   Difficult to place patient No  Still with large amount of diarrhea this am. Risk for dehydration . Encouraged more po hydration. If diarrhea slows down will dc in am.wife in agreement as she is the main care taker at home .          LOS: 1 day   Time spent: 35 min with >50% on coc   Lynn Ito, MD [facility] Pager [phone]  If 7PM-7AM, please contact night-coverage 07/28/2020, 1:25 PM

## 2020-07-29 LAB — GLUCOSE, CAPILLARY
Glucose-Capillary: 119 mg/dL — ABNORMAL HIGH (ref 70–99)
Glucose-Capillary: 123 mg/dL — ABNORMAL HIGH (ref 70–99)

## 2020-07-29 LAB — CBC
HCT: 33.5 % — ABNORMAL LOW (ref 39.0–52.0)
Hemoglobin: 10.5 g/dL — ABNORMAL LOW (ref 13.0–17.0)
MCH: 28.3 pg (ref 26.0–34.0)
MCHC: 31.3 g/dL (ref 30.0–36.0)
MCV: 90.3 fL (ref 80.0–100.0)
Platelets: 282 10*3/uL (ref 150–400)
RBC: 3.71 MIL/uL — ABNORMAL LOW (ref 4.22–5.81)
RDW: 13.6 % (ref 11.5–15.5)
WBC: 9.9 10*3/uL (ref 4.0–10.5)
nRBC: 0 % (ref 0.0–0.2)

## 2020-07-29 LAB — POTASSIUM: Potassium: 3.5 mmol/L (ref 3.5–5.1)

## 2020-07-29 MED ORDER — POTASSIUM CHLORIDE CRYS ER 10 MEQ PO TBCR
10.0000 meq | EXTENDED_RELEASE_TABLET | Freq: Once | ORAL | Status: AC
  Administered 2020-07-29: 10 meq via ORAL
  Filled 2020-07-29: qty 1

## 2020-07-29 NOTE — Progress Notes (Addendum)
received Md order to discharge patient to home, reviewed discharge instructions, homes meds and follow up appointments with patient wife and patient and wife verbalized understanding

## 2020-07-29 NOTE — Discharge Summary (Signed)
Jeff Key DVV:[id number] DOB: 08-24-1945 DOA: 07/26/2020  PCP: [name], MD  Admit date: 07/26/2020 Discharge date: 07/29/2020  Admitted From: Home Disposition: Home  Recommendations for Outpatient Follow-up:  1. Follow up with PCP in 1 week 2. Please obtain BMP/CBC in one week   Home Health:yes    Discharge Condition:Stable CODE STATUS:full  Diet recommendation: Heart Healthy / Carb Modified Brief/Interim Summary: Per GYI:RSWNI Jeff Key is a 75 y.o. male with medical history significant of advanced dementia, diabetes, coronary artery disease with ischemic cardiomyopathy, GERD, history of stomach ulcers, GERD was brought into the ED with vomiting and diarrhea since this morning.  Patient also apparently noted to have some level of hypoxia.  He is unable to communicate due to mental status changes.  Patient's work-up shows some enteritis and he meets sepsis criteria with temperature heart rate.  He will be admitted to the hospital for further evaluation and treatment.  He was found to be positive for rotavirus.  He was treated and managed with supportive care.  His mental status is at baseline.  He is stable for discharge.  His diarrhea has formed to mushy nests and only had one episode today.    Sepsis present on admission suspected due to enteritis appears likely due to Rotavirus enteritis. --Patient has sick contact with son-in-law with similar symptoms C. difficile negative.  Rotavirus positive on stool panel  patient was treated with supportive care abx discontinued since viral Repeat lactic acid improved CT reflected enteritis. Patient was hydrated. Hemodynamically stable.  Starting to form mushy stools, and diarrhea # of stools have decreased. He is tolerating feeding and hydration   History of ischemic cardiomyopathy CAD Continue ACE inhibitor, nitro sublingual, beta-blockers, aspirin, statin home dose  Alzheimer's dementia --patient is dependent on  all his ADLs. He is taken care of by wife at home. He does not ambulate uses wheelchair. He is able to transfer from bed to wheelchair Wife declined PT/OT   Type II diabetes  BG stable stable here while off metformin. A1c 6.8 Instructed to f/u with pcp to discuss metformin reinitiation when pt able to eat his diet regimen at home.    Coughing-  Instructed wife to have pt sit up while eating and post feeding up for 2 hours before lying down. CXR with atelectasis no airspace dz/or edema 02 sat 95% on RA   Discharge Diagnoses:  Principal Problem:   Sepsis (HCC) Active Problems:   CAD (coronary artery disease)   Ischemic cardiomyopathy   Alzheimer's dementia without behavioral disturbance (HCC)   Acute metabolic encephalopathy   Enteritis   [facility])    Discharge Instructions  Discharge Instructions    Diet - low sodium heart healthy   Complete by: As directed    Diet Carb Modified   Complete by: As directed    Discharge instructions   Complete by: As directed    Hold metformin, check blood sugars, if they start going up and he is eating more need to resume.  Follow up with pcp in 3 days, needs blood work. Hydrate hydrate   Increase activity slowly   Complete by: As directed      Allergies as of 07/29/2020   No Known Allergies     Medication List    STOP taking these medications   metFORMIN 500 MG tablet Commonly known as: GLUCOPHAGE     TAKE these medications   aspirin EC 81 MG tablet Take 81 mg by mouth daily.   atorvastatin  20 MG tablet Commonly known as: LIPITOR Take 1 tablet by mouth once daily   balsalazide 750 MG capsule Commonly known as: COLAZAL Take 2,250 mg by mouth 3 (three) times daily.   clopidogrel 75 MG tablet Commonly known as: PLAVIX Take 1 tablet (75 mg total) by mouth daily.   escitalopram 5 MG tablet Commonly known as: LEXAPRO Take 10 mg by mouth daily.   ferrous sulfate 325 (65 FE) MG tablet Take 325 mg by mouth daily  with breakfast.   galantamine 12 MG tablet Commonly known as: RAZADYNE Take 12 mg by mouth 2 (two) times daily with a meal.   lisinopril 10 MG tablet Commonly known as: ZESTRIL Take 1 tablet by mouth once daily   memantine 10 MG tablet Commonly known as: NAMENDA Take 10 mg by mouth 2 (two) times daily.   metoprolol tartrate 25 MG tablet Commonly known as: LOPRESSOR Take 1 tablet by mouth twice daily   nitroGLYCERIN 0.4 MG SL tablet Commonly known as: Nitrostat Place 1 tablet (0.4 mg total) under the tongue every 5 (five) minutes as needed for chest pain.   omeprazole 20 MG capsule Commonly known as: PRILOSEC Take 20 mg by mouth daily.   vitamin B-12 1000 MCG tablet Commonly known as: CYANOCOBALAMIN Take 1,000 mcg by mouth daily.       Follow-up Information    [name], MD Follow up in 3 day(s).   Specialty: Internal Medicine Contact information: [address] [place] [address] [phone]              No Known Allergies  Consultations:     Procedures/Studies: CT CHEST ABDOMEN PELVIS W CONTRAST  Result Date: 07/26/2020 CLINICAL DATA:  75 year old male with abdominal pain. EXAM: CT CHEST, ABDOMEN, AND PELVIS WITH CONTRAST TECHNIQUE: Multidetector CT imaging of the chest, abdomen and pelvis was performed following the standard protocol during bolus administration of intravenous contrast. CONTRAST:  OMNIPAQUE IOHEXOL 300 MG/ML  SOLN COMPARISON:  CT abdomen pelvis dated 01/04/2016. FINDINGS: CT CHEST FINDINGS Cardiovascular: Top-normal cardiac size. No pericardial effusion. Advanced 3 vessel coronary vascular calcification. There is moderate atherosclerotic calcification of the thoracic aorta. No aneurysmal dilatation or dissection. The origins of the great vessels of the aortic arch appear patent. The central pulmonary arteries are unremarkable. Mediastinum/Nodes: The no hilar or mediastinal adenopathy. Small hiatal  hernia. The esophagus and the thyroid gland are grossly unremarkable. No mediastinal fluid collection. Lungs/Pleura: There are bibasilar linear atelectasis/scarring. No focal consolidation, pleural effusion, or pneumothorax. The the central airways are patent. Musculoskeletal: Degenerative changes of the spine. No acute osseous pathology. CT ABDOMEN PELVIS FINDINGS No intra-abdominal free air or free fluid. Hepatobiliary: Probable fatty liver. Subcentimeter left hepatic hypodense focus is too small to characterize. No intrahepatic biliary dilatation. Small stones in the gallbladder. No pericholecystic fluid or evidence of acute cholecystitis by CT. Pancreas: Unremarkable. No pancreatic ductal dilatation or surrounding inflammatory changes. Spleen: Normal in size without focal abnormality. Adrenals/Urinary Tract: The adrenal glands unremarkable. There is no hydronephrosis on either side. There is symmetric enhancement and excretion of contrast by both kidneys. The visualized ureters and urinary bladder appear unremarkable. Stomach/Bowel: There is fluid-filled loops of small bowel throughout the abdomen likely representing enteritis. There is mild dilatation of proximal small bowel loops measuring up to 4 cm, likely ileus. There is loose stool throughout the colon compatible with diarrheal state. No definite evidence of obstruction. The appendix is normal. Vascular/Lymphatic: Advanced aortoiliac atherosclerotic disease. The IVC  is unremarkable. No portal venous gas. There is no adenopathy. Reproductive: The prostate and seminal vesicles are grossly unremarkable. No pelvic mass. Other: Small fat containing umbilical hernia. Musculoskeletal: Degenerative changes of the spine and osteopenia. No acute osseous pathology. IMPRESSION: 1. Enteritis with diarrheal state. Correlation with clinical exam and stool cultures recommended. No bowel obstruction. Normal appendix. 2. Cholelithiasis. 3. Aortic Atherosclerosis  (ICD10-I70.0). Electronically Signed   By: Elgie Collard M.D.   On: 07/26/2020 22:53   DG Chest Port 1 View  Result Date: 07/28/2020 CLINICAL DATA:  Rhonchi EXAM: PORTABLE CHEST 1 VIEW COMPARISON:  Chest radiograph and chest CT Jul 26, 2020 FINDINGS: There is mild atelectatic change in the lung bases. There is no edema or airspace opacity. Heart is mildly enlarged with pulmonary vascularity normal. No adenopathy. There is aortic atherosclerosis. There is degenerative change in each shoulder. IMPRESSION: Stable cardiac prominence. Bibasilar atelectasis. No edema or airspace opacity. Aortic Atherosclerosis (ICD10-I70.0). Electronically Signed   By: Bretta Bang III M.D.   On: 07/28/2020 11:32   DG Chest Port 1 View  Result Date: 07/26/2020 CLINICAL DATA:  75 year old male with sepsis. EXAM: PORTABLE CHEST 1 VIEW COMPARISON:  Chest radiograph dated 10/16/2019. FINDINGS: There is shallow inspiration. Diffuse interstitial and vascular prominence as seen previously may represent vascular congestion and edema. Faint left mid lung field densities, likely atelectasis and vascular crowding. No focal consolidation, pleural effusion or pneumothorax. Stable cardiomegaly. Atherosclerotic calcification of the aorta. No acute osseous pathology. IMPRESSION: Vascular congestion and edema. No focal consolidation. Electronically Signed   By: Elgie Collard M.D.   On: 07/26/2020 20:21       Subjective: Pt interactive and at baseline per wife.   Discharge Exam: Vitals:   07/29/20 0553 07/29/20 0731  BP: (!) 158/90 (!) 163/90  Pulse: (!) 59 64  Resp: 16 16  Temp: 98.1 F (36.7 C) 98.7 F (37.1 C)  SpO2: 91% 94%   Vitals:   07/28/20 1954 07/29/20 0128 07/29/20 0553 07/29/20 0731  BP: (!) 168/87 (!) 142/83 (!) 158/90 (!) 163/90  Pulse: 62 61 (!) 59 64  Resp: Temp: 98.6 F (37 C) 97.9 F (36.6 C) 98.1 F (36.7 C) 98.7 F (37.1 C)  TempSrc: Oral Oral Oral Oral  SpO2: 94% 94% 91% 94%   Weight:      Height:        General: Pt is alert, awake, not in acute distress Cardiovascular: RRR, S1/S2 +, no rubs, no gallops Respiratory: CTA bilaterally, no wheezing, no rhonchi Abdominal: Soft, NT, ND, bowel sounds + Extremities: no edema    The results of significant diagnostics from this hospitalization (including imaging, microbiology, ancillary and laboratory) are listed below for reference.     Microbiology: Recent Results (from the past 240 hour(s))  Resp Panel by RT-PCR (Flu A&B, Covid) Nasopharyngeal Swab     Status: None   Collection Time: 07/26/20  7:39 PM   Specimen: Nasopharyngeal Swab; Nasopharyngeal(NP) swabs in vial transport medium  Result Value Ref Range Status   SARS Coronavirus 2 by RT PCR NEGATIVE NEGATIVE Final    Comment: (NOTE) SARS-CoV-2 target nucleic acids are NOT DETECTED.  The SARS-CoV-2 RNA is generally detectable in upper respiratory specimens during the acute phase of infection. The lowest concentration of SARS-CoV-2 viral copies this assay can detect is 138 copies/mL. A negative result does not preclude SARS-Cov-2 infection and should not be used as the sole basis for treatment or other patient management decisions. A  negative result may occur with  improper specimen collection/handling, submission of specimen other than nasopharyngeal swab, presence of viral mutation(s) within the areas targeted by this assay, and inadequate number of viral copies(<138 copies/mL). A negative result must be combined with clinical observations, patient history, and epidemiological information. The expected result is Negative.  Fact Sheet for Patients:  BloggerCourse.com  Fact Sheet for Healthcare Providers:  SeriousBroker.it  This test is no t yet approved or cleared by the [place] FDA and  has been authorized for detection and/or diagnosis of SARS-CoV-2 by FDA under an Emergency Use  Authorization (EUA). This EUA will remain  in effect (meaning this test can be used) for the duration of the COVID-19 declaration under Section 564(b)(1) of the Act, 21 U.S.C.section 360bbb-3(b)(1), unless the authorization is terminated  or revoked sooner.       Influenza A by PCR NEGATIVE NEGATIVE Final   Influenza B by PCR NEGATIVE NEGATIVE Final    Comment: (NOTE) The Xpert Xpress SARS-CoV-2/FLU/RSV plus assay is intended as an aid in the diagnosis of influenza from Nasopharyngeal swab specimens and should not be used as a sole basis for treatment. Nasal washings and aspirates are unacceptable for Xpert Xpress SARS-CoV-2/FLU/RSV testing.  Fact Sheet for Patients: BloggerCourse.com  Fact Sheet for Healthcare Providers: SeriousBroker.it  This test is not yet approved or cleared by the [place] FDA and has been authorized for detection and/or diagnosis of SARS-CoV-2 by FDA under an Emergency Use Authorization (EUA). This EUA will remain in effect (meaning this test can be used) for the duration of the COVID-19 declaration under Section 564(b)(1) of the Act, 21 U.S.C. section 360bbb-3(b)(1), unless the authorization is terminated or revoked.  Performed at [facility], [address] [place], [place] [address]   Blood Culture (routine x 2)     Status: None (Preliminary result)   Collection Time: 07/26/20  7:39 PM   Specimen: BLOOD  Result Value Ref Range Status   Specimen Description BLOOD BLOOD LEFT FOREARM  Final   Special Requests   Final    BOTTLES DRAWN AEROBIC AND ANAEROBIC Blood Culture results may not be optimal due to an inadequate volume of blood received in culture bottles   Culture   Final    NO GROWTH 3 DAYS Performed at [facility], [address], [place], [place] [address]    Report Status PENDING  Incomplete  Urine culture     Status: None   Collection Time: 07/26/20  7:39 PM    Specimen: In/Out Cath Urine  Result Value Ref Range Status   Specimen Description   Final    IN/OUT CATH URINE Performed at [facility], [address] [place], [place] [address]    Special Requests   Final    NONE Performed at [facility], [address], [place], [place] [address]    Culture   Final    NO GROWTH Performed at [facility], [address], [place], [place] [address]    Report Status 07/28/2020 FINAL  Final  Blood Culture (routine x 2)     Status: None (Preliminary result)   Collection Time: 07/26/20  7:44 PM   Specimen: BLOOD  Result Value Ref Range Status   Specimen Description BLOOD BLOOD LEFT ARM  Final   Special Requests   Final    BOTTLES DRAWN AEROBIC AND ANAEROBIC Blood Culture results may not be optimal due to an inadequate volume of blood received in culture  bottles   Culture   Final    NO GROWTH 3 DAYS Performed at [facility], [address] [place], [place] [address]    Report Status PENDING  Incomplete  C Difficile Quick Screen w PCR reflex     Status: None   Collection Time: 07/27/20  1:00 PM   Specimen: STOOL  Result Value Ref Range Status   C Diff antigen NEGATIVE NEGATIVE Final   C Diff toxin NEGATIVE NEGATIVE Final   C Diff interpretation No C. difficile detected.  Final    Comment: Performed at [facility], [address] [place], [place] [address]  Gastrointestinal Panel by PCR , Stool     Status: Abnormal   Collection Time: 07/27/20  1:00 PM   Specimen: Stool  Result Value Ref Range Status   Campylobacter species NOT DETECTED NOT DETECTED Final   Plesimonas shigelloides NOT DETECTED NOT DETECTED Final   Salmonella species NOT DETECTED NOT DETECTED Final   Yersinia enterocolitica NOT DETECTED NOT DETECTED Final   Vibrio species NOT DETECTED NOT DETECTED Final   Vibrio cholerae NOT DETECTED NOT DETECTED Final   Enteroaggregative E coli (EAEC) NOT DETECTED NOT DETECTED Final    Enteropathogenic E coli (EPEC) NOT DETECTED NOT DETECTED Final   Enterotoxigenic E coli (ETEC) NOT DETECTED NOT DETECTED Final   Shiga like toxin producing E coli (STEC) NOT DETECTED NOT DETECTED Final   Shigella/Enteroinvasive E coli (EIEC) NOT DETECTED NOT DETECTED Final   Cryptosporidium NOT DETECTED NOT DETECTED Final   Cyclospora cayetanensis NOT DETECTED NOT DETECTED Final   Entamoeba histolytica NOT DETECTED NOT DETECTED Final   Giardia lamblia NOT DETECTED NOT DETECTED Final   Adenovirus F40/41 NOT DETECTED NOT DETECTED Final   Astrovirus NOT DETECTED NOT DETECTED Final   Norovirus GI/GII NOT DETECTED NOT DETECTED Final   Rotavirus A DETECTED (A) NOT DETECTED Final   Sapovirus (I, II, IV, and V) NOT DETECTED NOT DETECTED Final    Comment: Performed at [facility], [address] [place], [place] [address]     Labs: BNP (last 3 results) Recent Labs    10/16/19 1627  BNP 321.8*   Basic Metabolic Panel: Recent Labs  Lab 07/26/20 1930 07/27/20 0819 07/29/20 0325  NA 138 136  --   K 4.7 3.9 3.5  CL 97* 102  --   CO2 26 25  --   GLUCOSE 197* 129*  --   BUN 24* 25*  --   CREATININE 1.28* 1.19  --   CALCIUM 9.3 7.9*  --    Liver Function Tests: Recent Labs  Lab 07/26/20 1930 07/27/20 0819  AST 28 22  ALT 17 12  ALKPHOS 106 60  BILITOT 0.7 0.5  PROT 8.9* 6.6  ALBUMIN 4.2 3.0*   No results for input(s): LIPASE, AMYLASE in the last 168 hours. No results for input(s): AMMONIA in the last 168 hours. CBC: Recent Labs  Lab 07/26/20 1930 07/27/20 0819 07/29/20 0325  WBC 14.6* 13.0* 9.9  NEUTROABS 12.7*  --   --   HGB 13.2 9.9* 10.5*  HCT 40.7 31.4* 33.5*  MCV 88.9 90.0 90.3  PLT 337 262 282   Cardiac Enzymes: No results for input(s): CKTOTAL, CKMB, CKMBINDEX, TROPONINI in the last 168 hours. BNP: Invalid input(s): POCBNP CBG: Recent Labs  Lab 07/28/20 0745 07/28/20 1124 07/28/20 1810 07/28/20 1958 07/29/20 0733  GLUCAP 123* 125* 117*  126* 119*   D-Dimer No results for input(s): DDIMER in the last  72 hours. Hgb A1c Recent Labs    07/27/20 0819  HGBA1C 6.8*   Lipid Profile No results for input(s): CHOL, HDL, LDLCALC, TRIG, CHOLHDL, LDLDIRECT in the last 72 hours. Thyroid function studies No results for input(s): TSH, T4TOTAL, T3FREE, THYROIDAB in the last 72 hours.  Invalid input(s): FREET3 Anemia work up No results for input(s): VITAMINB12, FOLATE, FERRITIN, TIBC, IRON, RETICCTPCT in the last 72 hours. Urinalysis    Component Value Date/Time   COLORURINE AMBER (A) 07/26/2020 1939   APPEARANCEUR HAZY (A) 07/26/2020 1939   APPEARANCEUR Clear 10/16/2014 1539   LABSPEC 1.028 07/26/2020 1939   PHURINE 5.0 07/26/2020 1939   GLUCOSEU NEGATIVE 07/26/2020 1939   HGBUR NEGATIVE 07/26/2020 1939   BILIRUBINUR NEGATIVE 07/26/2020 1939   BILIRUBINUR Negative 10/16/2014 1539   KETONESUR NEGATIVE 07/26/2020 1939   PROTEINUR NEGATIVE 07/26/2020 1939   NITRITE NEGATIVE 07/26/2020 1939   LEUKOCYTESUR NEGATIVE 07/26/2020 1939   Sepsis Labs Invalid input(s): PROCALCITONIN,  WBC,  LACTICIDVEN Microbiology Recent Results (from the past 240 hour(s))  Resp Panel by RT-PCR (Flu A&B, Covid) Nasopharyngeal Swab     Status: None   Collection Time: 07/26/20  7:39 PM   Specimen: Nasopharyngeal Swab; Nasopharyngeal(NP) swabs in vial transport medium  Result Value Ref Range Status   SARS Coronavirus 2 by RT PCR NEGATIVE NEGATIVE Final    Comment: (NOTE) SARS-CoV-2 target nucleic acids are NOT DETECTED.  The SARS-CoV-2 RNA is generally detectable in upper respiratory specimens during the acute phase of infection. The lowest concentration of SARS-CoV-2 viral copies this assay can detect is 138 copies/mL. A negative result does not preclude SARS-Cov-2 infection and should not be used as the sole basis for treatment or other patient management decisions. A negative result may occur with  improper specimen collection/handling,  submission of specimen other than nasopharyngeal swab, presence of viral mutation(s) within the areas targeted by this assay, and inadequate number of viral copies(<138 copies/mL). A negative result must be combined with clinical observations, patient history, and epidemiological information. The expected result is Negative.  Fact Sheet for Patients:  BloggerCourse.com  Fact Sheet for Healthcare Providers:  SeriousBroker.it  This test is no t yet approved or cleared by the [place] FDA and  has been authorized for detection and/or diagnosis of SARS-CoV-2 by FDA under an Emergency Use Authorization (EUA). This EUA will remain  in effect (meaning this test can be used) for the duration of the COVID-19 declaration under Section 564(b)(1) of the Act, 21 U.S.C.section 360bbb-3(b)(1), unless the authorization is terminated  or revoked sooner.       Influenza A by PCR NEGATIVE NEGATIVE Final   Influenza B by PCR NEGATIVE NEGATIVE Final    Comment: (NOTE) The Xpert Xpress SARS-CoV-2/FLU/RSV plus assay is intended as an aid in the diagnosis of influenza from Nasopharyngeal swab specimens and should not be used as a sole basis for treatment. Nasal washings and aspirates are unacceptable for Xpert Xpress SARS-CoV-2/FLU/RSV testing.  Fact Sheet for Patients: BloggerCourse.com  Fact Sheet for Healthcare Providers: SeriousBroker.it  This test is not yet approved or cleared by the [place] FDA and has been authorized for detection and/or diagnosis of SARS-CoV-2 by FDA under an Emergency Use Authorization (EUA). This EUA will remain in effect (meaning this test can be used) for the duration of the COVID-19 declaration under Section 564(b)(1) of the Act, 21 U.S.C. section 360bbb-3(b)(1), unless the authorization is terminated or revoked.  Performed at [facility], [address] [place],  [place] [address]   Blood Culture (routine x 2)     Status: None (Preliminary result)   Collection Time: 07/26/20  7:39 PM   Specimen: BLOOD  Result Value Ref Range Status   Specimen Description BLOOD BLOOD LEFT FOREARM  Final   Special Requests   Final    BOTTLES DRAWN AEROBIC AND ANAEROBIC Blood Culture results may not be optimal due to an inadequate volume of blood received in [id number] bottles   Culture   Final    NO GROWTH 3 DAYS Performed at [facility], [address] [place], [place] [address]    Report Status PENDING  Incomplete  Urine culture     Status: None   Collection Time: 07/26/20  7:39 PM   Specimen: In/Out Cath Urine  Result Value Ref Range Status   Specimen Description   Final    IN/OUT CATH URINE Performed at [facility], [address] [place], [place] [address]    Special Requests   Final    NONE Performed at [facility], [address] [place], [place] [address]    Culture   Final    NO GROWTH Performed at [facility], [address] [place], [place] [address]    Report Status 07/28/2020 FINAL  Final  Blood Culture (routine x 2)     Status: None (Preliminary result)   Collection Time: 07/26/20  7:44 PM   Specimen: BLOOD  Result Value Ref Range Status   Specimen Description BLOOD BLOOD LEFT ARM  Final   Special Requests   Final    BOTTLES DRAWN AEROBIC AND ANAEROBIC Blood Culture results may not be optimal due to an inadequate volume of blood received in culture bottles   Culture   Final    NO GROWTH 3 DAYS Performed at [facility], [address] [place], [place] [address]    Report Status PENDING  Incomplete  C Difficile Quick Screen w PCR reflex     Status: None   Collection Time: 07/27/20  1:00 PM   Specimen: STOOL  Result Value Ref Range Status   C Diff antigen NEGATIVE NEGATIVE Final   C Diff toxin NEGATIVE NEGATIVE Final   C Diff interpretation No C. difficile  detected.  Final    Comment: Performed at [facility], [address] [place], [place] [address]  Gastrointestinal Panel by PCR , Stool     Status: Abnormal   Collection Time: 07/27/20  1:00 PM   Specimen: Stool  Result Value Ref Range Status   Campylobacter species NOT DETECTED NOT DETECTED Final   Plesimonas shigelloides NOT DETECTED NOT DETECTED Final   Salmonella species NOT DETECTED NOT DETECTED Final   Yersinia enterocolitica NOT DETECTED NOT DETECTED Final   Vibrio species NOT DETECTED NOT DETECTED Final   Vibrio cholerae NOT DETECTED NOT DETECTED Final   Enteroaggregative E coli (EAEC) NOT DETECTED NOT DETECTED Final   Enteropathogenic E coli (EPEC) NOT DETECTED NOT DETECTED Final   Enterotoxigenic E coli (ETEC) NOT DETECTED NOT DETECTED Final   Shiga like toxin producing E coli (STEC) NOT DETECTED NOT DETECTED Final   Shigella/Enteroinvasive E coli (EIEC) NOT DETECTED NOT DETECTED Final   Cryptosporidium NOT DETECTED NOT DETECTED Final   Cyclospora cayetanensis NOT DETECTED NOT DETECTED Final   Entamoeba histolytica NOT DETECTED NOT DETECTED Final   Giardia lamblia NOT DETECTED NOT DETECTED Final   Adenovirus F40/41 NOT DETECTED NOT DETECTED Final   Astrovirus NOT DETECTED NOT  DETECTED Final   Norovirus GI/GII NOT DETECTED NOT DETECTED Final   Rotavirus A DETECTED (A) NOT DETECTED Final   Sapovirus (I, II, IV, and V) NOT DETECTED NOT DETECTED Final    Comment: Performed at [facility], [address], [place], [place] [address]     Time coordinating discharge: Over 30 minutes  SIGNED:   Lynn Ito, MD  [facility] 07/29/2020, 10:36 AM Pager   If 7PM-7AM, please contact night-coverage [email or web address] Password [email or web address]

## 2020-07-29 NOTE — TOC Transition Note (Signed)
Transition of Care [facility]) - CM/SW Discharge Note   Patient Details  Name: Jeff Key MRN: [id number] Date of Birth: 1946/03/04  Transition of Care [facility]) CM/SW Contact:  Allayne Butcher, RN Phone Number: 07/29/2020, 2:28 PM   Clinical Narrative:    Patient medically cleared for discharge home.  EMS has been arranged, patient is 2nd in line for pick up.     Final next level of care: Home/Self Care Barriers to Discharge: No Barriers Identified   Patient Goals and CMS Choice Patient states their goals for this hospitalization and ongoing recovery are:: To get back home      Discharge Placement                Patient to be transferred to facility by: EMS transporting patient home      Discharge Plan and Services                DME Arranged: N/A DME Agency: NA       HH Arranged: NA,Refused HH          Social Determinants of Health (SDOH) Interventions     Readmission Risk Interventions No flowsheet data found.

## 2020-07-29 NOTE — TOC Initial Note (Signed)
Transition of Care [facility]) - Initial/Assessment Note    Patient Details  Name: Jeff Key MRN: [id number] Date of Birth: 11/06/1945  Transition [facility] Care [facility]) CM/SW Contact:    Shelbie Hutching, RN Phone Number: 07/29/2020, 11:07 AM  Clinical Narrative:                 Patient admitted to the hospital with enteritis.  RNCM met with patient and the patient's wife, Carlyon Shadow, at the bedside.  Patient has advanced dementia and is mostly non verbal.  Wife is the patient's caregiver and takes care of all ADL's and IADLs.  Patient has 3 daughters that help with patient care, they alternate staying at night.  Patient has a transport chair, shower stool, and lift chair.  He has a walker but wife reports he doesn't use it.  She provides all his transportation but she has daughters help to get him in and out of the car.  Patient is current with Dr. Ginette Pitman.  [name] declines home health services at this time but RNCM did explain that if she changes her mind in the future she can reach out to Dr. Ginette Pitman to get [facility] ordered.    Expected Discharge Plan: Home/Self Care Barriers to Discharge: No Barriers Identified   Patient Goals and CMS Choice Patient states their goals for this hospitalization and ongoing recovery are:: To get back home      Expected Discharge Plan and Services Expected Discharge Plan: Home/Self Care       Living arrangements for the past 2 months: Single Family Home Expected Discharge Date: 07/29/20               DME Arranged: N/A DME Agency: NA       HH Arranged: NA,Refused HH          Prior Living Arrangements/Services Living arrangements for the past 2 months: Single Family Home Lives with:: Spouse Patient language and need for interpreter reviewed:: Yes Do you feel safe going back to the place where you live?: Yes      Need for Family Participation in Patient Care: Yes (Comment) (dementia) Care giver support system in place?: Yes (comment) (wife and  daughters) Current home services: DME (transport chair, walker, lift chair) Criminal Activity/Legal Involvement Pertinent to Current Situation/Hospitalization: No - Comment as needed  Activities of Daily Living Home Assistive Devices/Equipment: None ADL Screening (condition at time of admission) Patient's cognitive ability adequate to safely complete daily activities?: No Is the patient deaf or have difficulty hearing?: No Does the patient have difficulty seeing, even when wearing glasses/contacts?: No Does the patient have difficulty concentrating, remembering, or making decisions?: Yes Patient able to express need for assistance with ADLs?: No Does the patient have difficulty dressing or bathing?: Yes Independently performs ADLs?: No Communication: Dependent Is this a change from baseline?: Pre-admission baseline Dressing (OT): Dependent Is this a change from baseline?: Pre-admission baseline Grooming: Dependent Is this a change from baseline?: Pre-admission baseline Feeding: Dependent Is this a change from baseline?: Pre-admission baseline Bathing: Dependent Is this a change from baseline?: Pre-admission baseline Toileting: Dependent Is this a change from baseline?: Pre-admission baseline In/Out Bed: Dependent Is this a change from baseline?: Pre-admission baseline Walks in Home: Dependent Is this a change from baseline?: Pre-admission baseline Does the patient have difficulty walking or climbing stairs?: Yes Weakness of Legs: Both Weakness of Arms/Hands: Both  Permission Sought/Granted Permission sought to share information with : Case Manager,Family Supports,Other (comment) Permission granted to share information with :  Yes, Verbal Permission Granted  Share Information with NAME: Carlyon Shadow  Permission granted to share info w AGENCY: [facility]  Permission granted to share info w Relationship: wife     Emotional Assessment Appearance:: Appears stated  age Attitude/Demeanor/Rapport: Unable to Assess Affect (typically observed): Quiet,Happy Orientation: : Oriented to Self Alcohol / Substance Use: Not Applicable Psych Involvement: No (comment)  Admission diagnosis:  Shortness of breath [R06.02] Enteritis [K52.9] Ileus ([place]) [K56.7] Generalized abdominal pain [R10.84] Acute respiratory failure with hypoxia (HCC) [J96.01] Sepsis ([place]) [A41.9] Acute on chronic systolic (congestive) heart failure (HCC) [I50.23] Sepsis, due to unspecified organism, unspecified whether acute organ dysfunction present [facility]) [A41.9] Patient Active Problem List   Diagnosis Date Noted  . Enteritis   . Ileus ([place])   . Acute on chronic systolic CHF (congestive heart failure) ([place])   . Goals of care, counseling/discussion   . Palliative care by specialist   . CAP (community acquired pneumonia) 10/14/2019  . Acute metabolic encephalopathy 71/24/5809  . hx of Ulcerative colitis ([place]) 10/14/2019  . Acute respiratory failure with hypoxia ([place]) 10/14/2019  . Severe sepsis ([place]) 10/14/2019  . Hyperlipidemia 06/15/2017  . Alzheimer's dementia without behavioral disturbance ([place]) 12/11/2016  . Sepsis ([place]) 03/10/2016  . 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 NSVT (nonsustained ventricular tachycardia) ([place]) 02/15/2016  . CAD (coronary artery disease)   . Ischemic cardiomyopathy   . STEMI involving oth coronary artery of inferior wall ([place]) 02/10/2016  . Abnormal urinalysis 10/16/2014   PCP:  Tracie Harrier, MD Pharmacy:   [facility] [address] [place] [address] [place] [address] Phone: [phone] Fax: [phone]     Social Determinants of Health (SDOH) Interventions    Readmission Risk Interventions No flowsheet data found.

## 2020-07-31 LAB — CULTURE, BLOOD (ROUTINE X 2)
Culture: NO GROWTH
Culture: NO GROWTH

## 2020-08-06 DIAGNOSIS — I472 Ventricular tachycardia: Secondary | ICD-10-CM | POA: Diagnosis not present

## 2020-08-06 DIAGNOSIS — G309 Alzheimer's disease, unspecified: Secondary | ICD-10-CM | POA: Diagnosis not present

## 2020-08-06 DIAGNOSIS — R197 Diarrhea, unspecified: Secondary | ICD-10-CM | POA: Diagnosis not present

## 2020-08-06 DIAGNOSIS — K5190 Ulcerative colitis, unspecified, without complications: Secondary | ICD-10-CM | POA: Diagnosis not present

## 2020-08-06 DIAGNOSIS — I2510 Atherosclerotic heart disease of native coronary artery without angina pectoris: Secondary | ICD-10-CM | POA: Diagnosis not present

## 2020-08-06 DIAGNOSIS — F0280 Dementia in other diseases classified elsewhere without behavioral disturbance: Secondary | ICD-10-CM | POA: Diagnosis not present

## 2020-08-06 DIAGNOSIS — F0150 Vascular dementia without behavioral disturbance: Secondary | ICD-10-CM | POA: Diagnosis not present

## 2020-08-06 DIAGNOSIS — R112 Nausea with vomiting, unspecified: Secondary | ICD-10-CM | POA: Diagnosis not present

## 2020-08-06 DIAGNOSIS — D649 Anemia, unspecified: Secondary | ICD-10-CM | POA: Diagnosis not present

## 2020-08-06 DIAGNOSIS — Z09 Encounter for follow-up examination after completed treatment for conditions other than malignant neoplasm: Secondary | ICD-10-CM | POA: Diagnosis not present

## 2020-08-23 ENCOUNTER — Other Ambulatory Visit: Payer: Self-pay | Admitting: Cardiovascular Disease

## 2020-11-05 DIAGNOSIS — Z125 Encounter for screening for malignant neoplasm of prostate: Secondary | ICD-10-CM | POA: Diagnosis not present

## 2020-11-05 DIAGNOSIS — I2510 Atherosclerotic heart disease of native coronary artery without angina pectoris: Secondary | ICD-10-CM | POA: Diagnosis not present

## 2020-11-05 DIAGNOSIS — Z6833 Body mass index (BMI) 33.0-33.9, adult: Secondary | ICD-10-CM | POA: Diagnosis not present

## 2020-11-05 DIAGNOSIS — F0280 Dementia in other diseases classified elsewhere without behavioral disturbance: Secondary | ICD-10-CM | POA: Diagnosis not present

## 2020-11-05 DIAGNOSIS — Z6835 Body mass index (BMI) 35.0-35.9, adult: Secondary | ICD-10-CM | POA: Diagnosis not present

## 2020-11-05 DIAGNOSIS — K5190 Ulcerative colitis, unspecified, without complications: Secondary | ICD-10-CM | POA: Diagnosis not present

## 2020-11-05 DIAGNOSIS — F0150 Vascular dementia without behavioral disturbance: Secondary | ICD-10-CM | POA: Diagnosis not present

## 2020-11-05 DIAGNOSIS — Z0000 Encounter for general adult medical examination without abnormal findings: Secondary | ICD-10-CM | POA: Diagnosis not present

## 2020-11-05 DIAGNOSIS — R7309 Other abnormal glucose: Secondary | ICD-10-CM | POA: Diagnosis not present

## 2020-11-05 DIAGNOSIS — E785 Hyperlipidemia, unspecified: Secondary | ICD-10-CM | POA: Diagnosis not present

## 2020-11-05 DIAGNOSIS — G309 Alzheimer's disease, unspecified: Secondary | ICD-10-CM | POA: Diagnosis not present

## 2021-01-31 DIAGNOSIS — H6123 Impacted cerumen, bilateral: Secondary | ICD-10-CM | POA: Diagnosis not present

## 2021-01-31 DIAGNOSIS — H6063 Unspecified chronic otitis externa, bilateral: Secondary | ICD-10-CM | POA: Diagnosis not present

## 2021-05-23 DIAGNOSIS — G309 Alzheimer's disease, unspecified: Secondary | ICD-10-CM | POA: Diagnosis not present

## 2021-05-23 DIAGNOSIS — F0150 Vascular dementia without behavioral disturbance: Secondary | ICD-10-CM | POA: Diagnosis not present

## 2021-05-23 DIAGNOSIS — K5130 Ulcerative (chronic) rectosigmoiditis without complications: Secondary | ICD-10-CM | POA: Diagnosis not present

## 2021-05-23 DIAGNOSIS — F0280 Dementia in other diseases classified elsewhere without behavioral disturbance: Secondary | ICD-10-CM | POA: Diagnosis not present

## 2021-06-07 DIAGNOSIS — F0150 Vascular dementia without behavioral disturbance: Secondary | ICD-10-CM | POA: Diagnosis not present

## 2021-06-07 DIAGNOSIS — E785 Hyperlipidemia, unspecified: Secondary | ICD-10-CM | POA: Diagnosis not present

## 2021-06-07 DIAGNOSIS — I255 Ischemic cardiomyopathy: Secondary | ICD-10-CM | POA: Diagnosis not present

## 2021-06-07 DIAGNOSIS — R8290 Unspecified abnormal findings in urine: Secondary | ICD-10-CM | POA: Diagnosis not present

## 2021-06-07 DIAGNOSIS — R410 Disorientation, unspecified: Secondary | ICD-10-CM | POA: Diagnosis not present

## 2021-06-07 DIAGNOSIS — I4729 Other ventricular tachycardia: Secondary | ICD-10-CM | POA: Diagnosis not present

## 2021-06-07 DIAGNOSIS — G309 Alzheimer's disease, unspecified: Secondary | ICD-10-CM | POA: Diagnosis not present

## 2021-06-07 DIAGNOSIS — R413 Other amnesia: Secondary | ICD-10-CM | POA: Diagnosis not present

## 2021-06-07 DIAGNOSIS — Z6833 Body mass index (BMI) 33.0-33.9, adult: Secondary | ICD-10-CM | POA: Diagnosis not present

## 2021-06-07 DIAGNOSIS — Z0000 Encounter for general adult medical examination without abnormal findings: Secondary | ICD-10-CM | POA: Diagnosis not present

## 2021-06-07 DIAGNOSIS — F0280 Dementia in other diseases classified elsewhere without behavioral disturbance: Secondary | ICD-10-CM | POA: Diagnosis not present

## 2021-06-07 DIAGNOSIS — F330 Major depressive disorder, recurrent, mild: Secondary | ICD-10-CM | POA: Diagnosis not present

## 2021-06-07 DIAGNOSIS — I2510 Atherosclerotic heart disease of native coronary artery without angina pectoris: Secondary | ICD-10-CM | POA: Diagnosis not present

## 2021-06-07 DIAGNOSIS — R7309 Other abnormal glucose: Secondary | ICD-10-CM | POA: Diagnosis not present

## 2021-06-07 DIAGNOSIS — D649 Anemia, unspecified: Secondary | ICD-10-CM | POA: Diagnosis not present

## 2021-06-09 DIAGNOSIS — Z6833 Body mass index (BMI) 33.0-33.9, adult: Secondary | ICD-10-CM | POA: Diagnosis not present

## 2021-06-09 DIAGNOSIS — E785 Hyperlipidemia, unspecified: Secondary | ICD-10-CM | POA: Diagnosis not present

## 2021-06-09 DIAGNOSIS — R413 Other amnesia: Secondary | ICD-10-CM | POA: Diagnosis not present

## 2021-06-09 DIAGNOSIS — D649 Anemia, unspecified: Secondary | ICD-10-CM | POA: Diagnosis not present

## 2021-06-09 DIAGNOSIS — R8290 Unspecified abnormal findings in urine: Secondary | ICD-10-CM | POA: Diagnosis not present

## 2021-07-09 ENCOUNTER — Other Ambulatory Visit: Payer: Self-pay | Admitting: Cardiovascular Disease

## 2021-09-10 ENCOUNTER — Other Ambulatory Visit: Payer: Self-pay | Admitting: Cardiovascular Disease

## 2021-09-19 ENCOUNTER — Telehealth: Payer: Self-pay | Admitting: Cardiovascular Disease

## 2021-09-19 NOTE — Telephone Encounter (Signed)
Wife states she cannot get medications refilled because patient has not been seen in over a year. Patient has Alzheimer DZ  and is bedridden. Would you like for me to refill all of his cardiac medications or do you need to have a visit with patient first?

## 2021-09-19 NOTE — Telephone Encounter (Signed)
Patient's wife states the patient's medications will not be refilled without a follow up, but she says at his last appointment Dr. Allyson Sabal told them he did not need to follow up again. She says he also would not be able to come into the office, because he is basically bedridden.

## 2021-09-21 ENCOUNTER — Other Ambulatory Visit: Payer: Self-pay

## 2021-09-21 MED ORDER — METOPROLOL TARTRATE 25 MG PO TABS: 25.0000 mg | ORAL_TABLET | Freq: Two times a day (BID) | ORAL | 3 refills | Status: DC

## 2021-09-21 MED ORDER — LISINOPRIL 10 MG PO TABS: 10.0000 mg | ORAL_TABLET | Freq: Every day | ORAL | 3 refills | Status: DC

## 2021-09-21 MED ORDER — ATORVASTATIN CALCIUM 20 MG PO TABS: 20.0000 mg | ORAL_TABLET | Freq: Every day | ORAL | 3 refills | Status: DC

## 2021-09-21 MED ORDER — CLOPIDOGREL BISULFATE 75 MG PO TABS: 75.0000 mg | ORAL_TABLET | Freq: Every day | ORAL | 3 refills | Status: DC

## 2021-09-21 NOTE — Telephone Encounter (Signed)
Spoke with wife. Cardiac meds refilled.

## 2021-11-07 DIAGNOSIS — Z515 Encounter for palliative care: Secondary | ICD-10-CM | POA: Diagnosis not present

## 2021-11-08 IMAGING — CT CT HEAD W/O CM
3 series · 15 of 47 positions shown, 18 images · non-contrast
Comparison: August 18, 2019

CLINICAL DATA: Delirium

EXAM:
CT HEAD WITHOUT CONTRAST
TECHNIQUE: Contiguous axial images were obtained from the base of the skull
through the vertex without intravenous contrast.

[Series 2: head wo · axial · 0.45mm/px · z∈[-181,-46]mm · 9 of 33 slices shown, 12 images]
[im 3/33  brain]
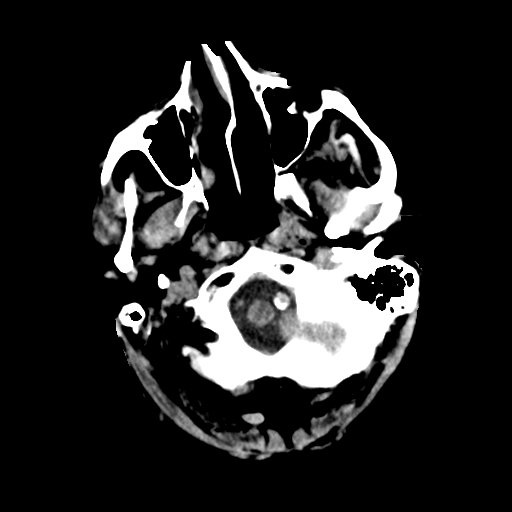
[im 3/33  bone]
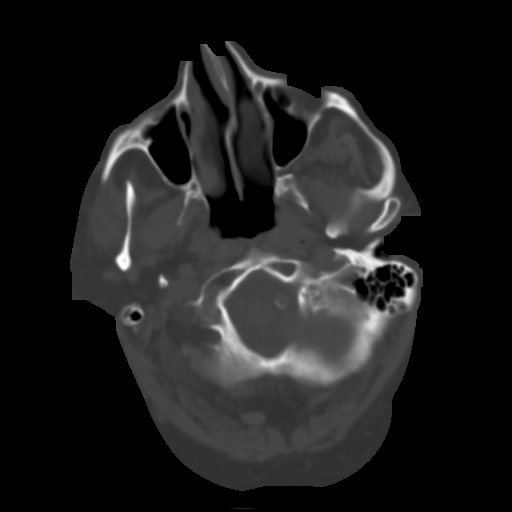
[im 6/33  brain]
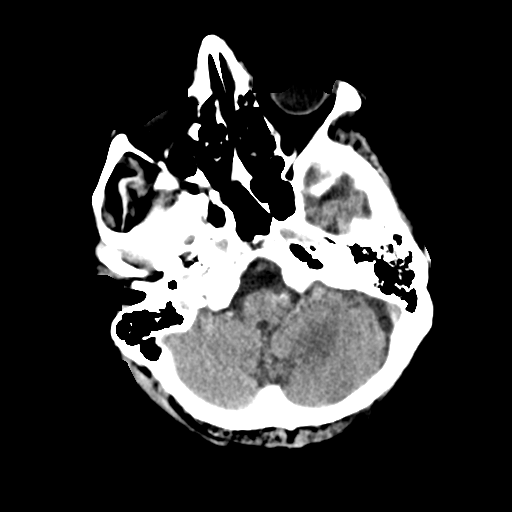
[im 9/33  brain]
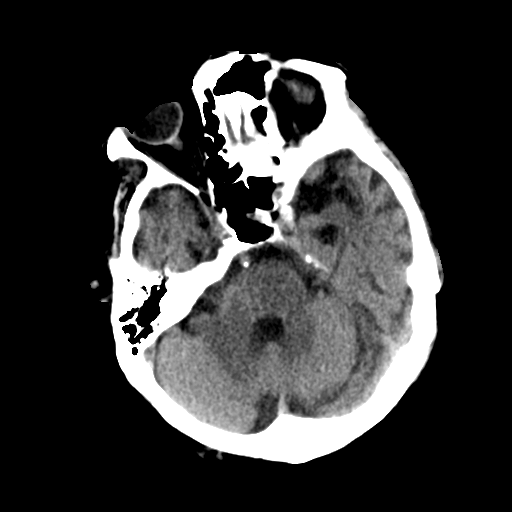
[im 13/33  brain]
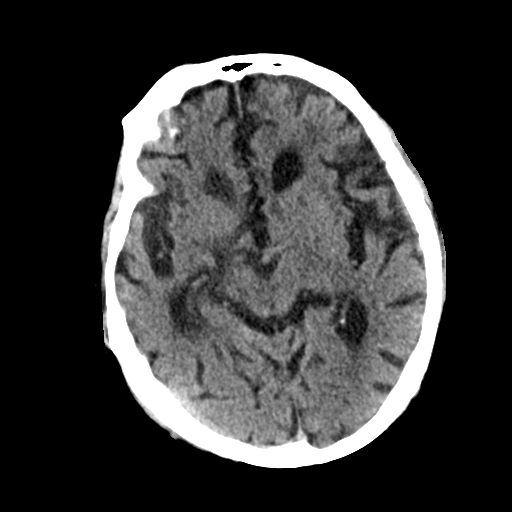
[im 17/33  brain]
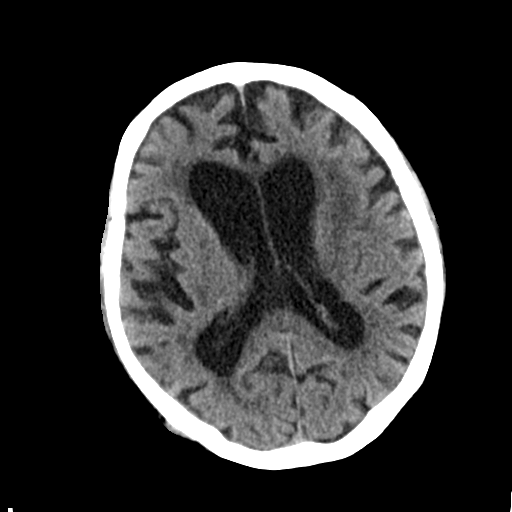
[im 17/33  bone]
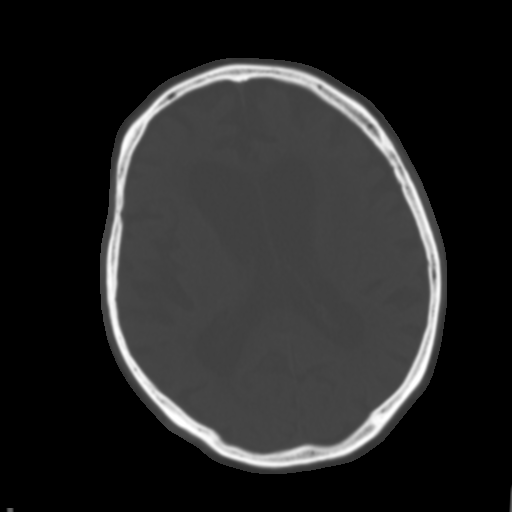
[im 20/33  brain]
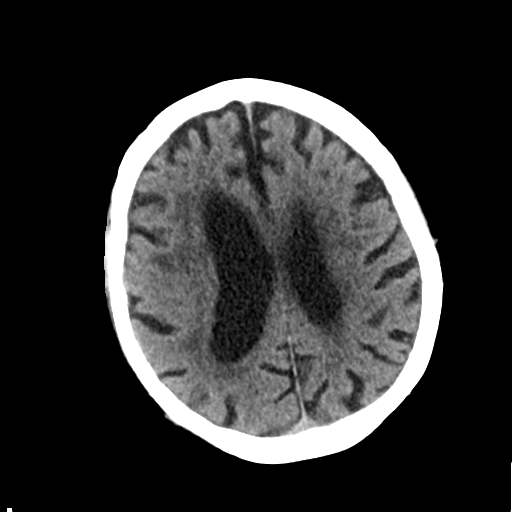
[im 24/33  brain]
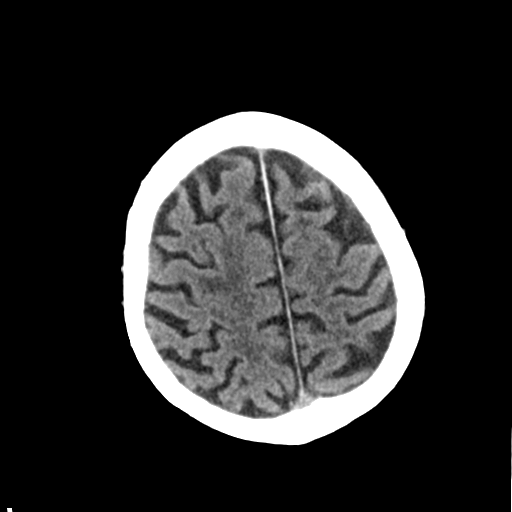
[im 27/33  brain]
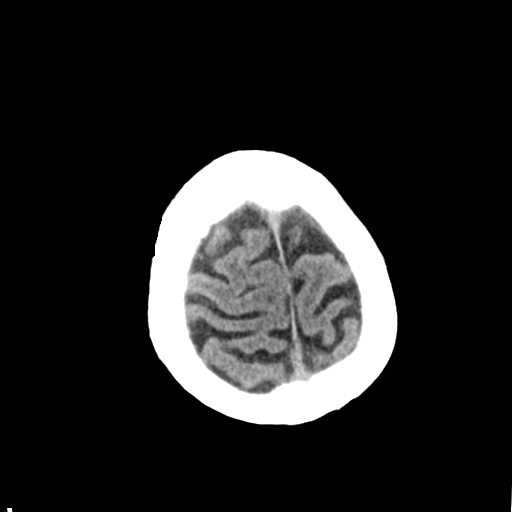
[im 30/33  brain]
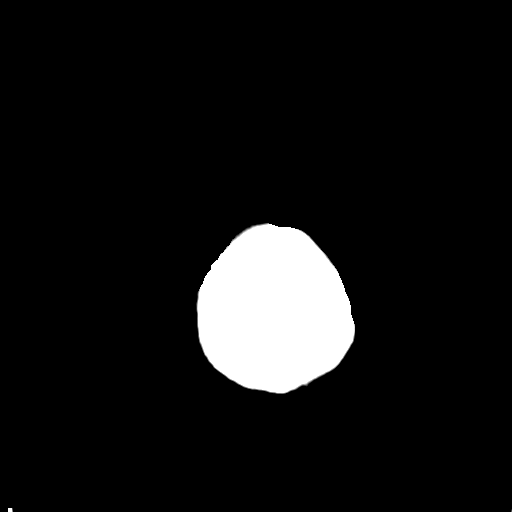
[im 30/33  bone]
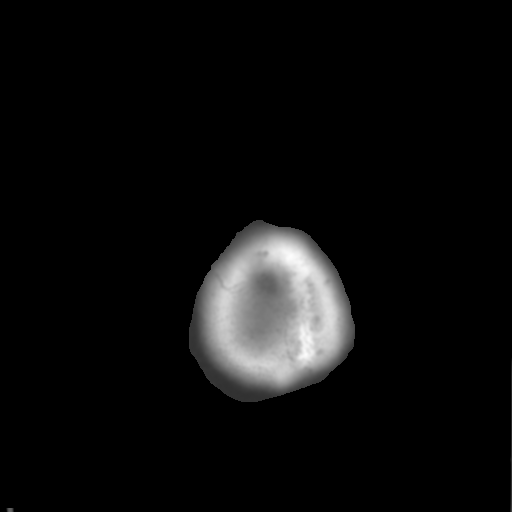

[Series 4: coronal soft tissue · coronal · 0.33mm/px · 3 of 77 slices shown]
[im 26/77  brain]
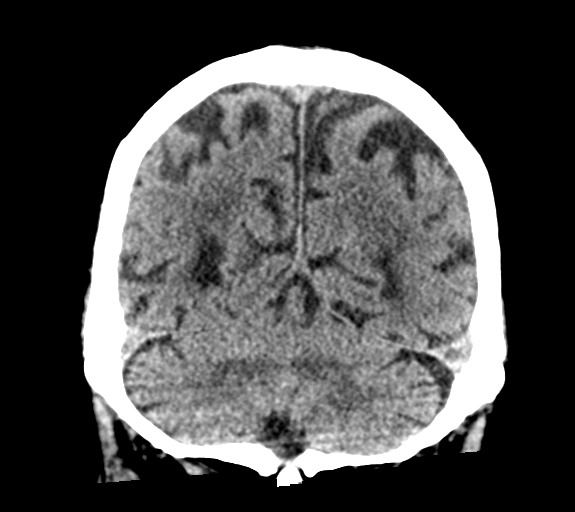
[im 34/77  brain]
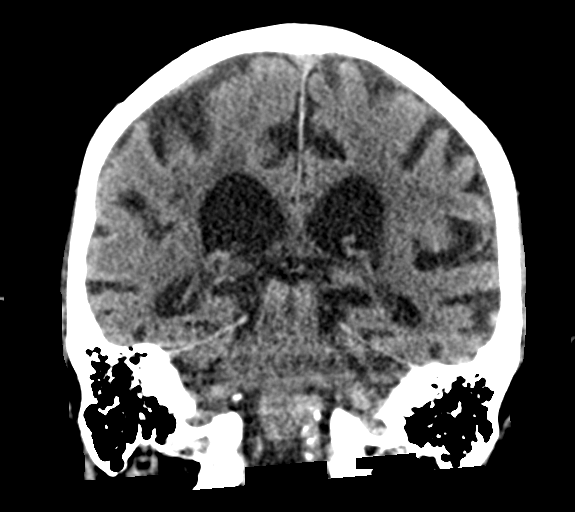
[im 43/77  brain]
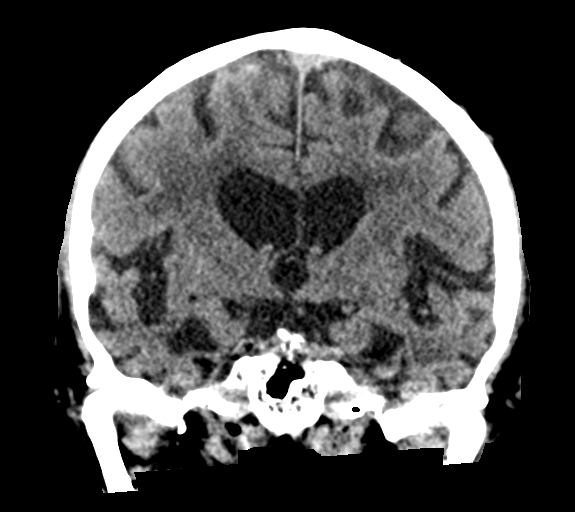

[Series 5: sagittal soft tissue · sagittal · 0.31mm/px · 3 of 64 slices shown]
[im 22/64  brain]
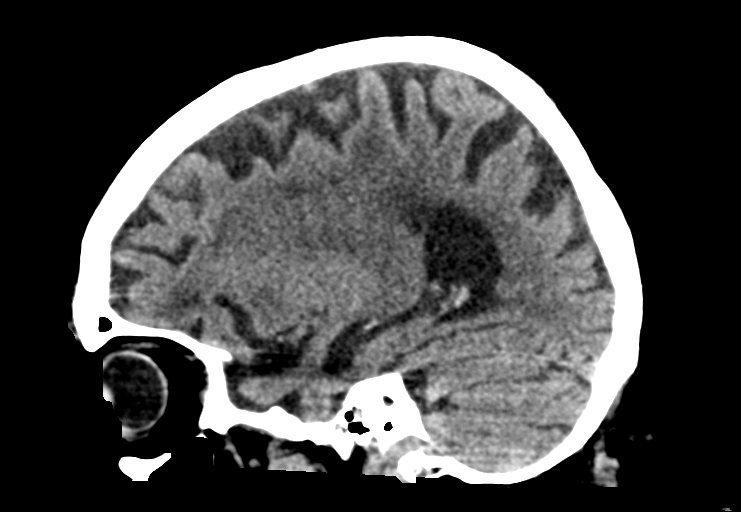
[im 32/64  brain]
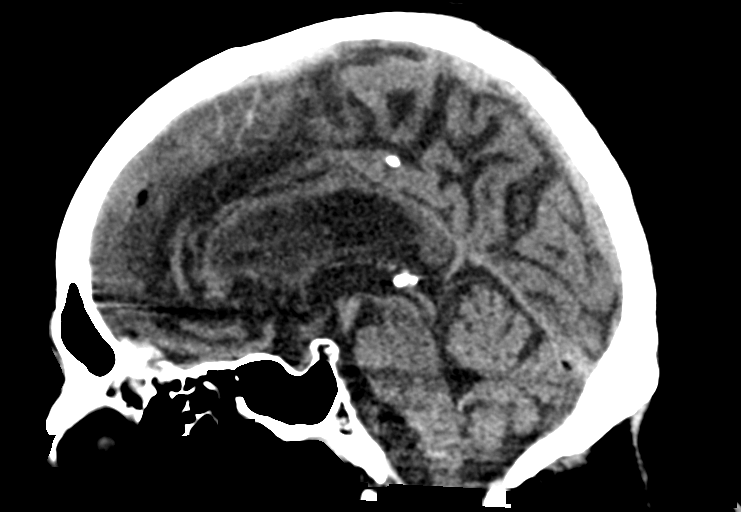
[im 43/64  brain]
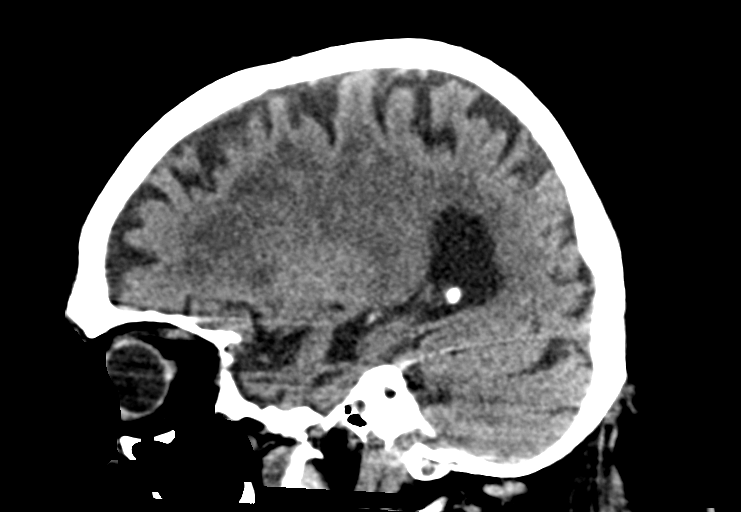

[15 of 47 positions shown; findings below may reference images not displayed]

FINDINGS: Brain: No evidence of acute territorial infarction, hemorrhage,
hydrocephalus,extra-axial collection or mass lesion/mass effect.
There is dilatation the ventricles and sulci consistent with
age-related atrophy. Low-attenuation changes in the deep white
matter consistent with small vessel ischemia.

Vascular: No hyperdense vessel. Calcifications are seen within the
internal carotid artery.

Skull: The skull is intact. No fracture or focal lesion identified.

Sinuses/Orbits: The visualized paranasal sinuses and mastoid air
cells are clear. The orbits and globes intact.

Other: None
IMPRESSION: No acute intracranial abnormality.

Findings consistent with age related atrophy and chronic small
vessel ischemia

## 2021-11-16 ENCOUNTER — Inpatient Hospital Stay: Payer: PPO

## 2021-11-16 ENCOUNTER — Encounter: Payer: Self-pay | Admitting: Emergency Medicine

## 2021-11-16 ENCOUNTER — Emergency Department: Payer: PPO

## 2021-11-16 ENCOUNTER — Inpatient Hospital Stay
Admission: EM | Admit: 2021-11-16 | Discharge: 2021-11-20 | DRG: 871 | Disposition: A | Discharge status: Home Health | Payer: PPO | Attending: Hospitalist | Admitting: Hospitalist

## 2021-11-16 DIAGNOSIS — K5150 Left sided colitis without complications: Secondary | ICD-10-CM | POA: Diagnosis not present

## 2021-11-16 DIAGNOSIS — K851 Biliary acute pancreatitis without necrosis or infection: Secondary | ICD-10-CM | POA: Diagnosis not present

## 2021-11-16 DIAGNOSIS — Z66 Do not resuscitate: Secondary | ICD-10-CM | POA: Diagnosis present

## 2021-11-16 DIAGNOSIS — I252 Old myocardial infarction: Secondary | ICD-10-CM | POA: Diagnosis not present

## 2021-11-16 DIAGNOSIS — K219 Gastro-esophageal reflux disease without esophagitis: Secondary | ICD-10-CM | POA: Diagnosis present

## 2021-11-16 DIAGNOSIS — I2510 Atherosclerotic heart disease of native coronary artery without angina pectoris: Secondary | ICD-10-CM | POA: Diagnosis present

## 2021-11-16 DIAGNOSIS — E119 Type 2 diabetes mellitus without complications: Secondary | ICD-10-CM | POA: Diagnosis not present

## 2021-11-16 DIAGNOSIS — Z8042 Family history of malignant neoplasm of prostate: Secondary | ICD-10-CM | POA: Diagnosis not present

## 2021-11-16 DIAGNOSIS — K8020 Calculus of gallbladder without cholecystitis without obstruction: Secondary | ICD-10-CM | POA: Diagnosis not present

## 2021-11-16 DIAGNOSIS — Z7401 Bed confinement status: Secondary | ICD-10-CM | POA: Diagnosis not present

## 2021-11-16 DIAGNOSIS — N390 Urinary tract infection, site not specified: Secondary | ICD-10-CM | POA: Diagnosis not present

## 2021-11-16 DIAGNOSIS — Z515 Encounter for palliative care: Secondary | ICD-10-CM

## 2021-11-16 DIAGNOSIS — F0390 Unspecified dementia without behavioral disturbance: Secondary | ICD-10-CM | POA: Diagnosis not present

## 2021-11-16 DIAGNOSIS — G9341 Metabolic encephalopathy: Secondary | ICD-10-CM | POA: Diagnosis present

## 2021-11-16 DIAGNOSIS — I10 Essential (primary) hypertension: Secondary | ICD-10-CM | POA: Diagnosis not present

## 2021-11-16 DIAGNOSIS — R1111 Vomiting without nausea: Secondary | ICD-10-CM | POA: Diagnosis not present

## 2021-11-16 DIAGNOSIS — R001 Bradycardia, unspecified: Secondary | ICD-10-CM | POA: Diagnosis not present

## 2021-11-16 DIAGNOSIS — I255 Ischemic cardiomyopathy: Secondary | ICD-10-CM | POA: Diagnosis not present

## 2021-11-16 DIAGNOSIS — Z8711 Personal history of peptic ulcer disease: Secondary | ICD-10-CM

## 2021-11-16 DIAGNOSIS — I700 Atherosclerosis of aorta: Secondary | ICD-10-CM | POA: Diagnosis not present

## 2021-11-16 DIAGNOSIS — R531 Weakness: Secondary | ICD-10-CM | POA: Diagnosis not present

## 2021-11-16 DIAGNOSIS — Z87891 Personal history of nicotine dependence: Secondary | ICD-10-CM

## 2021-11-16 DIAGNOSIS — Z7984 Long term (current) use of oral hypoglycemic drugs: Secondary | ICD-10-CM | POA: Diagnosis not present

## 2021-11-16 DIAGNOSIS — I517 Cardiomegaly: Secondary | ICD-10-CM | POA: Diagnosis not present

## 2021-11-16 DIAGNOSIS — R7989 Other specified abnormal findings of blood chemistry: Secondary | ICD-10-CM | POA: Diagnosis not present

## 2021-11-16 DIAGNOSIS — R251 Tremor, unspecified: Secondary | ICD-10-CM | POA: Diagnosis not present

## 2021-11-16 DIAGNOSIS — E872 Acidosis, unspecified: Secondary | ICD-10-CM | POA: Diagnosis present

## 2021-11-16 DIAGNOSIS — R4182 Altered mental status, unspecified: Secondary | ICD-10-CM | POA: Diagnosis not present

## 2021-11-16 DIAGNOSIS — Z79899 Other long term (current) drug therapy: Secondary | ICD-10-CM

## 2021-11-16 DIAGNOSIS — R918 Other nonspecific abnormal finding of lung field: Secondary | ICD-10-CM | POA: Diagnosis not present

## 2021-11-16 DIAGNOSIS — R6520 Severe sepsis without septic shock: Secondary | ICD-10-CM | POA: Diagnosis not present

## 2021-11-16 DIAGNOSIS — Z955 Presence of coronary angioplasty implant and graft: Secondary | ICD-10-CM

## 2021-11-16 DIAGNOSIS — Z20822 Contact with and (suspected) exposure to covid-19: Secondary | ICD-10-CM | POA: Diagnosis not present

## 2021-11-16 DIAGNOSIS — K760 Fatty (change of) liver, not elsewhere classified: Secondary | ICD-10-CM | POA: Diagnosis not present

## 2021-11-16 DIAGNOSIS — D649 Anemia, unspecified: Secondary | ICD-10-CM | POA: Diagnosis present

## 2021-11-16 DIAGNOSIS — A4151 Sepsis due to Escherichia coli [E. coli]: Principal | ICD-10-CM | POA: Diagnosis present

## 2021-11-16 DIAGNOSIS — R945 Abnormal results of liver function studies: Secondary | ICD-10-CM | POA: Diagnosis not present

## 2021-11-16 DIAGNOSIS — A419 Sepsis, unspecified organism: Secondary | ICD-10-CM | POA: Diagnosis not present

## 2021-11-16 DIAGNOSIS — Z7902 Long term (current) use of antithrombotics/antiplatelets: Secondary | ICD-10-CM

## 2021-11-16 DIAGNOSIS — Z7189 Other specified counseling: Secondary | ICD-10-CM | POA: Diagnosis not present

## 2021-11-16 DIAGNOSIS — J9811 Atelectasis: Secondary | ICD-10-CM | POA: Diagnosis not present

## 2021-11-16 DIAGNOSIS — R112 Nausea with vomiting, unspecified: Secondary | ICD-10-CM | POA: Diagnosis not present

## 2021-11-16 DIAGNOSIS — R000 Tachycardia, unspecified: Secondary | ICD-10-CM | POA: Diagnosis not present

## 2021-11-16 DIAGNOSIS — J9601 Acute respiratory failure with hypoxia: Secondary | ICD-10-CM | POA: Diagnosis not present

## 2021-11-16 DIAGNOSIS — Z7982 Long term (current) use of aspirin: Secondary | ICD-10-CM

## 2021-11-16 DIAGNOSIS — R0902 Hypoxemia: Secondary | ICD-10-CM | POA: Diagnosis not present

## 2021-11-16 LAB — COMPREHENSIVE METABOLIC PANEL
ALT: 115 U/L — ABNORMAL HIGH (ref 0–44)
AST: 314 U/L — ABNORMAL HIGH (ref 15–41)
Albumin: 3.9 g/dL (ref 3.5–5.0)
Alkaline Phosphatase: 140 U/L — ABNORMAL HIGH (ref 38–126)
Anion gap: 14 (ref 5–15)
BUN: 15 mg/dL (ref 8–23)
CO2: 25 mmol/L (ref 22–32)
Calcium: 8.9 mg/dL (ref 8.9–10.3)
Chloride: 101 mmol/L (ref 98–111)
Creatinine, Ser: 1.09 mg/dL (ref 0.61–1.24)
GFR, Estimated: >60 mL/min (ref >60)
Glucose, Bld: 192 mg/dL — ABNORMAL HIGH (ref 70–99)
Potassium: 4.1 mmol/L (ref 3.5–5.1)
Sodium: 140 mmol/L (ref 135–145)
Total Bilirubin: 1.4 mg/dL — ABNORMAL HIGH (ref 0.3–1.2)
Total Protein: 7.9 g/dL (ref 6.5–8.1)

## 2021-11-16 LAB — RESP PANEL BY RT-PCR (FLU A&B, COVID) ARPGX2
Influenza A by PCR: NEGATIVE
Influenza B by PCR: NEGATIVE
SARS Coronavirus 2 by RT PCR: NEGATIVE

## 2021-11-16 LAB — BLOOD CULTURE ID PANEL (REFLEXED) - BCID2

## 2021-11-16 LAB — T4, FREE: Free T4: 1.15 ng/dL — ABNORMAL HIGH (ref 0.61–1.12)

## 2021-11-16 LAB — LIPID PANEL
Cholesterol: 99 mg/dL (ref 0–200)
HDL: 30 mg/dL — ABNORMAL LOW (ref >40)
LDL Cholesterol: 53 mg/dL (ref 0–99)
Total CHOL/HDL Ratio: 3.3 RATIO
Triglycerides: 81 mg/dL (ref <150)
VLDL: 16 mg/dL (ref 0–40)

## 2021-11-16 LAB — URINALYSIS, COMPLETE (UACMP) WITH MICROSCOPIC
Bilirubin Urine: NEGATIVE
Glucose, UA: NEGATIVE mg/dL
Ketones, ur: NEGATIVE mg/dL
Nitrite: NEGATIVE
Protein, ur: NEGATIVE mg/dL
RBC / HPF: >50 RBC/hpf — ABNORMAL HIGH (ref 0–5)
Specific Gravity, Urine: 1.018 (ref 1.005–1.030)
WBC, UA: >50 WBC/hpf — ABNORMAL HIGH (ref 0–5)
pH: 5 (ref 5.0–8.0)

## 2021-11-16 LAB — CBG MONITORING, ED: Glucose-Capillary: 147 mg/dL — ABNORMAL HIGH (ref 70–99)

## 2021-11-16 LAB — CBC WITH DIFFERENTIAL/PLATELET
Abs Immature Granulocytes: 0.07 10*3/uL (ref 0.00–0.07)
Basophils Absolute: 0 10*3/uL (ref 0.0–0.1)
Basophils Relative: 0 %
Eosinophils Absolute: 0.2 10*3/uL (ref 0.0–0.5)
Eosinophils Relative: 1 %
HCT: 42.7 % (ref 39.0–52.0)
Hemoglobin: 13.1 g/dL (ref 13.0–17.0)
Immature Granulocytes: 0 %
Lymphocytes Relative: 7 %
Lymphs Abs: 1.1 10*3/uL (ref 0.7–4.0)
MCH: 28.4 pg (ref 26.0–34.0)
MCHC: 30.7 g/dL (ref 30.0–36.0)
MCV: 92.6 fL (ref 80.0–100.0)
Monocytes Absolute: 0.1 10*3/uL (ref 0.1–1.0)
Monocytes Relative: 0 %
Neutro Abs: 14.8 10*3/uL — ABNORMAL HIGH (ref 1.7–7.7)
Neutrophils Relative %: 92 %
Platelets: 294 10*3/uL (ref 150–400)
RBC: 4.61 MIL/uL (ref 4.22–5.81)
RDW: 13 % (ref 11.5–15.5)
WBC: 16.2 10*3/uL — ABNORMAL HIGH (ref 4.0–10.5)
nRBC: 0 % (ref 0.0–0.2)

## 2021-11-16 LAB — HEPATIC FUNCTION PANEL
ALT: 149 U/L — ABNORMAL HIGH (ref 0–44)
AST: 280 U/L — ABNORMAL HIGH (ref 15–41)
Albumin: 2.9 g/dL — ABNORMAL LOW (ref 3.5–5.0)
Alkaline Phosphatase: 113 U/L (ref 38–126)
Bilirubin, Direct: 1.4 mg/dL — ABNORMAL HIGH (ref 0.0–0.2)
Indirect Bilirubin: 1.1 mg/dL — ABNORMAL HIGH (ref 0.3–0.9)
Total Bilirubin: 2.5 mg/dL — ABNORMAL HIGH (ref 0.3–1.2)
Total Protein: 6.4 g/dL — ABNORMAL LOW (ref 6.5–8.1)

## 2021-11-16 LAB — BLOOD GAS, ARTERIAL
Acid-Base Excess: 1.3 mmol/L (ref 0.0–2.0)
Bicarbonate: 25.9 mmol/L (ref 20.0–28.0)
O2 Content: 3.5 L/min
O2 Saturation: 98.4 %
Patient temperature: 37
pCO2 arterial: 40 mmHg (ref 32–48)
pH, Arterial: 7.42 (ref 7.35–7.45)
pO2, Arterial: 93 mmHg (ref 83–108)

## 2021-11-16 LAB — PROCALCITONIN: Procalcitonin: 0.21 ng/mL

## 2021-11-16 LAB — TROPONIN I (HIGH SENSITIVITY)
Troponin I (High Sensitivity): 15 ng/L (ref <18)
Troponin I (High Sensitivity): 11 ng/L (ref <18)

## 2021-11-16 LAB — LIPASE, BLOOD: Lipase: 284 U/L — ABNORMAL HIGH (ref 11–51)

## 2021-11-16 LAB — HEPATITIS PANEL, ACUTE
HCV Ab: NONREACTIVE
Hep A IgM: NONREACTIVE
Hep B C IgM: NONREACTIVE
Hepatitis B Surface Ag: NONREACTIVE

## 2021-11-16 LAB — LACTIC ACID, PLASMA
Lactic Acid, Venous: 5.6 mmol/L (ref 0.5–1.9)
Lactic Acid, Venous: >9 mmol/L (ref 0.5–1.9)
Lactic Acid, Venous: 2.6 mmol/L (ref 0.5–1.9)

## 2021-11-16 LAB — APTT: aPTT: 26 seconds (ref 24–36)

## 2021-11-16 LAB — CK: Total CK: 84 U/L (ref 49–397)

## 2021-11-16 LAB — PROTIME-INR
INR: 1 (ref 0.8–1.2)
Prothrombin Time: 13.2 seconds (ref 11.4–15.2)

## 2021-11-16 LAB — GLUCOSE, CAPILLARY: Glucose-Capillary: 133 mg/dL — ABNORMAL HIGH (ref 70–99)

## 2021-11-16 LAB — AMMONIA: Ammonia: 18 umol/L (ref 9–35)

## 2021-11-16 LAB — TSH: TSH: 1.35 u[IU]/mL (ref 0.350–4.500)

## 2021-11-16 LAB — MRSA NEXT GEN BY PCR, NASAL: MRSA by PCR Next Gen: NOT DETECTED

## 2021-11-16 LAB — BRAIN NATRIURETIC PEPTIDE: B Natriuretic Peptide: 52.1 pg/mL (ref 0.0–100.0)

## 2021-11-16 MED ORDER — ACETAMINOPHEN 325 MG RE SUPP
650.0000 mg | Freq: Once | RECTAL | Status: AC
  Administered 2021-11-16: 650 mg via RECTAL
  Filled 2021-11-16: qty 2

## 2021-11-16 MED ORDER — CEFEPIME HCL 2 G IV SOLR
2.0000 g | Freq: Once | INTRAVENOUS | Status: AC
  Administered 2021-11-16: 2 g via INTRAVENOUS
  Filled 2021-11-16: qty 12.5

## 2021-11-16 MED ORDER — LACTATED RINGERS IV BOLUS (SEPSIS)
1000.0000 mL | Freq: Once | INTRAVENOUS | Status: AC
  Administered 2021-11-16: 1000 mL via INTRAVENOUS

## 2021-11-16 MED ORDER — LACTATED RINGERS IV BOLUS
1000.0000 mL | Freq: Once | INTRAVENOUS | Status: AC
  Administered 2021-11-16: 1000 mL via INTRAVENOUS

## 2021-11-16 MED ORDER — ORAL CARE MOUTH RINSE: 15.0000 mL | OROMUCOSAL | Status: DC | PRN

## 2021-11-16 MED ORDER — PIPERACILLIN-TAZOBACTAM 3.375 G IVPB
3.3750 g | Freq: Three times a day (TID) | INTRAVENOUS | Status: DC
  Administered 2021-11-16 – 2021-11-20 (×12): 3.375 g via INTRAVENOUS
  Filled 2021-11-16 (×12): qty 50

## 2021-11-16 MED ORDER — ENOXAPARIN SODIUM 40 MG/0.4ML IJ SOSY
40.0000 mg | PREFILLED_SYRINGE | INTRAMUSCULAR | Status: DC
  Administered 2021-11-16 – 2021-11-19 (×4): 40 mg via SUBCUTANEOUS
  Filled 2021-11-16 (×4): qty 0.4

## 2021-11-16 MED ORDER — SODIUM CHLORIDE 0.9 % IV SOLN: 250.0000 mL | INTRAVENOUS | Status: DC

## 2021-11-16 MED ORDER — SODIUM CHLORIDE 0.9 % IV SOLN
2.0000 g | Freq: Three times a day (TID) | INTRAVENOUS | Status: DC
  Filled 2021-11-16: qty 12.5

## 2021-11-16 MED ORDER — VANCOMYCIN HCL 1250 MG/250ML IV SOLN: 1250.0000 mg | INTRAVENOUS | Status: DC

## 2021-11-16 MED ORDER — DOCUSATE SODIUM 100 MG PO CAPS: 100.0000 mg | ORAL_CAPSULE | Freq: Two times a day (BID) | ORAL | Status: DC | PRN

## 2021-11-16 MED ORDER — IOHEXOL 300 MG/ML  SOLN
100.0000 mL | Freq: Once | INTRAMUSCULAR | Status: AC | PRN
  Administered 2021-11-16: 100 mL via INTRAVENOUS

## 2021-11-16 MED ORDER — LACTATED RINGERS IV BOLUS (SEPSIS)
1500.0000 mL | Freq: Once | INTRAVENOUS | Status: AC
  Administered 2021-11-16: 1500 mL via INTRAVENOUS

## 2021-11-16 MED ORDER — NOREPINEPHRINE 4 MG/250ML-% IV SOLN: 2.0000 ug/min | INTRAVENOUS | Status: DC

## 2021-11-16 MED ORDER — LACTATED RINGERS IV SOLN: INTRAVENOUS | Status: DC

## 2021-11-16 MED ORDER — CHLORHEXIDINE GLUCONATE CLOTH 2 % EX PADS
6.0000 | MEDICATED_PAD | Freq: Every day | CUTANEOUS | Status: DC
  Administered 2021-11-16: 6 via TOPICAL

## 2021-11-16 MED ORDER — LACTATED RINGERS IV BOLUS: 1000.0000 mL | Freq: Once | INTRAVENOUS | Status: DC

## 2021-11-16 MED ORDER — ORAL CARE MOUTH RINSE
15.0000 mL | OROMUCOSAL | Status: DC
  Administered 2021-11-16 – 2021-11-19 (×13): 15 mL via OROMUCOSAL

## 2021-11-16 MED ORDER — VANCOMYCIN HCL IN DEXTROSE 1-5 GM/200ML-% IV SOLN
1000.0000 mg | Freq: Once | INTRAVENOUS | Status: AC
  Administered 2021-11-16: 1000 mg via INTRAVENOUS
  Filled 2021-11-16: qty 200

## 2021-11-16 MED ORDER — ONDANSETRON HCL 4 MG/2ML IJ SOLN
4.0000 mg | Freq: Once | INTRAMUSCULAR | Status: AC
  Administered 2021-11-16: 4 mg via INTRAVENOUS
  Filled 2021-11-16: qty 2

## 2021-11-16 MED ORDER — POLYETHYLENE GLYCOL 3350 17 G PO PACK: 17.0000 g | PACK | Freq: Every day | ORAL | Status: DC | PRN

## 2021-11-16 MED ORDER — VANCOMYCIN HCL 1500 MG/300ML IV SOLN
1500.0000 mg | Freq: Once | INTRAVENOUS | Status: AC
  Administered 2021-11-16: 1500 mg via INTRAVENOUS
  Filled 2021-11-16: qty 300

## 2021-11-16 MED ORDER — METRONIDAZOLE 500 MG/100ML IV SOLN
500.0000 mg | Freq: Once | INTRAVENOUS | Status: AC
Start: 2021-11-16 — End: 2021-11-16
  Administered 2021-11-16: 500 mg via INTRAVENOUS
  Filled 2021-11-16: qty 100

## 2021-11-16 NOTE — Progress Notes (Signed)
PHARMACY - PHYSICIAN COMMUNICATION CRITICAL VALUE ALERT - BLOOD CULTURE IDENTIFICATION (BCID)  Jeff Key is an 76 y.o. male who presented to [facility] on 11/16/2021 with a chief complaint of N/V, fever  Assessment:  blood cultures from 8/30 with GNR, BCID detects E. Coli.  Potential sources: GI or UTI  Name of physician (or Provider) Contacted: Dr Belia Heman  Current antibiotics: piperacillin/tazobactam  Changes to prescribed antibiotics recommended:  Patient is on recommended antibiotics - No changes needed  Results for orders placed or performed during the hospital encounter of 11/16/21  Blood Culture ID Panel (Reflexed) (Collected: 11/16/2021  2:21 AM)  Result Value Ref Range   Enterococcus faecalis NOT DETECTED NOT DETECTED   Enterococcus Faecium NOT DETECTED NOT DETECTED   Listeria monocytogenes NOT DETECTED NOT DETECTED   Staphylococcus species NOT DETECTED NOT DETECTED   Staphylococcus aureus (BCID) NOT DETECTED NOT DETECTED   Staphylococcus epidermidis NOT DETECTED NOT DETECTED   Staphylococcus lugdunensis NOT DETECTED NOT DETECTED   Streptococcus species NOT DETECTED NOT DETECTED   Streptococcus agalactiae NOT DETECTED NOT DETECTED   Streptococcus pneumoniae NOT DETECTED NOT DETECTED   Streptococcus pyogenes NOT DETECTED NOT DETECTED   A.calcoaceticus-baumannii NOT DETECTED NOT DETECTED   Bacteroides fragilis NOT DETECTED NOT DETECTED   Enterobacterales DETECTED (A) NOT DETECTED   Enterobacter cloacae complex NOT DETECTED NOT DETECTED   Escherichia coli DETECTED (A) NOT DETECTED   Klebsiella aerogenes NOT DETECTED NOT DETECTED   Klebsiella oxytoca NOT DETECTED NOT DETECTED   Klebsiella pneumoniae NOT DETECTED NOT DETECTED   Proteus species NOT DETECTED NOT DETECTED   Salmonella species NOT DETECTED NOT DETECTED   Serratia marcescens NOT DETECTED NOT DETECTED   Haemophilus influenzae NOT DETECTED NOT DETECTED   Neisseria meningitidis NOT DETECTED NOT DETECTED    Pseudomonas aeruginosa NOT DETECTED NOT DETECTED   Stenotrophomonas maltophilia NOT DETECTED NOT DETECTED   Candida albicans NOT DETECTED NOT DETECTED   Candida auris NOT DETECTED NOT DETECTED   Candida glabrata NOT DETECTED NOT DETECTED   Candida krusei NOT DETECTED NOT DETECTED   Candida parapsilosis NOT DETECTED NOT DETECTED   Candida tropicalis NOT DETECTED NOT DETECTED   Cryptococcus neoformans/gattii NOT DETECTED NOT DETECTED   CTX-M ESBL NOT DETECTED NOT DETECTED   Carbapenem resistance IMP NOT DETECTED NOT DETECTED   Carbapenem resistance KPC NOT DETECTED NOT DETECTED   Carbapenem resistance NDM NOT DETECTED NOT DETECTED   Carbapenem resist OXA 48 LIKE NOT DETECTED NOT DETECTED   Carbapenem resistance VIM NOT DETECTED NOT DETECTED   Juliette Alcide, PharmD, BCPS, BCIDP Work Cell: [phone] 11/16/2021 1:23 PM

## 2021-11-16 NOTE — IPAL (Signed)
  Interdisciplinary Goals of Care Family Meeting   Date carried out: 11/16/2021  Location of the meeting: Bedside  Member's involved: Physician and Family Member or next of kin     GOALS OF CARE DISCUSSION  The Clinical status was relayed to family in detail-Wife and Daughter at Bedside  Updated and notified of patients medical condition- Patient remains unresponsive and will not open eyes to command.   Explained to family course of therapy and the modalities   Patient with Progressive multiorgan failure with a very high probablity of a very minimal chance of meaningful recovery despite all aggressive and optimal medical therapy.    Family understands the situation. Patient is nonverbal severe dementia at baseline Admitted for severe sepsis and infection and inflammation of pancreas with severe toxins and acidosis  They have consented and agreed to DNR/DNI status as this would cause severe suffering  Family are satisfied with Plan of action and management. All questions answered  Additional CC time 25 mins   Mckynna Vanloan Santiago Glad, M.D.  [facility]  Medical Director [facility] Medical Director [facility] Cardio-Pulmonary Department

## 2021-11-16 NOTE — Sepsis Progress Note (Signed)
Following per sepsis protocol   

## 2021-11-16 NOTE — Progress Notes (Signed)
PHARMACY -  BRIEF ANTIBIOTIC NOTE   Pharmacy has received consult(s) for Vanc, Cefepime from an ED provider.  The patient's profile has been reviewed for ht/wt/allergies/indication/available labs.    One time order(s) placed for Vancomying 2500 mg IV X 1 and Cefepime 2 gm IV X 1.   Further antibiotics/pharmacy consults should be ordered by admitting physician if indicated.                       Thank you, Gavin Telford D 11/16/2021  3:08 AM

## 2021-11-16 NOTE — ED Triage Notes (Signed)
Pt presents via EMS from home with complaints of N/V that started 2 hours ago with associated tremors. Per family, pt is nonverbal at baseline due to dementia. No fevers at home, bloody stools, CP or  SOB.

## 2021-11-16 NOTE — Progress Notes (Signed)
Pharmacy Antibiotic Note  Jeff Key is a 76 y.o. male admitted on 11/16/2021 with sepsis.  Pharmacy has been consulted for Zosyn, Vancomycin dosing.  Plan: Cefepime 2 gm IV X 1 given in ED on 8/30 @ 0240.  Zoysn 3.375 gm IV Q8H EI ordered to start on 8/30 @ 1100.  Vancomycin 1 gm IV X 1 given in ED on 8/30 @ 0244 followed by  Vanc 1500 mg IV X 1 on 8/30 @ 0344.   Vancomycin 1250 mg IV Q24H ordered to start on 8/31 @ 0300.  AUC = 513.5 Vanc trough = 11.9    Height: 5\' 10"  (177.8 cm) Weight: 67.5 kg (148 lb 13 oz) IBW/kg (Calculated) : 73  Temp (24hrs), Avg:100.5 F (38.1 C), Min:99 F (37.2 C), Max:102.2 F (39 C)  Recent Labs  Lab 11/16/21 0221 11/16/21 0459  WBC 16.2*  --   CREATININE 1.09  --   LATICACIDVEN 5.6* >9.0*    Estimated Creatinine Clearance: 55 mL/min (by C-G formula based on SCr of 1.09 mg/dL).    No Known Allergies  Antimicrobials this admission:   >>    >>   Dose adjustments this admission:   Microbiology results:  BCx:   UCx:    Sputum:    MRSA PCR:   Thank you for allowing pharmacy to be a part of this patient's care.  Loyal Rudy D 11/16/2021 6:55 AM

## 2021-11-16 NOTE — Sepsis Progress Note (Signed)
Notified provider via secure chat of need to order repeat lactic acid.  Second lactic acid >9.0. Provider aware.

## 2021-11-16 NOTE — Progress Notes (Addendum)
Patient transferred to ICU from ED, Vasopressor on hold at this time per RN Alycia. No need  for US guided PIV at this time.

## 2021-11-16 NOTE — Progress Notes (Signed)
CODE SEPSIS - PHARMACY COMMUNICATION  **Broad Spectrum Antibiotics should be administered within 1 hour of Sepsis diagnosis**  Time Code Sepsis Called/Page Received: 8/30 @ 0212  Antibiotics Ordered:  Vanc, Cefepime   Time of 1st antibiotic administration:  Cefepime 2 gm IV X 1 on 8/30 @ 0240.   Additional action taken by pharmacy:   If necessary, Name of Provider/Nurse Contacted:     Terilynn Buresh D ,PharmD Clinical Pharmacist  11/16/2021  3:08 AM

## 2021-11-16 NOTE — H&P (Addendum)
NAME:  Jeff Key, MRN:  [id number], DOB:  Oct 26, 1945, LOS: 0 ADMISSION DATE:  11/16/2021, CONSULTATION DATE: 11/16/2021 REFERRING MD: Pryor Curia, DO, CHIEF COMPLAINT: Altered mental status   HPI  76 y.o with significant PMH of CAD, ischemic cardiomyopathy, GERD, chronic ulcerative proctosigmoiditis, advanced dementia, DM, and anemia who presented to the ED with chief complaints of nausea and vomiting with associated tremors.  Patient is nonverbal at baseline due to advanced dementia.  ED Course: In the emergency department, vital signs showed an initial elevated blood pressure of 148/105 mm Hg, tachycardia of 123 beats per minute, a respiratory rate of 30 breaths per minute, and a temperature of 102.2 F (39C).  Oxygen saturation 94% on 4 L nasal cannula.  He was lethargic, moaned intermittently, and nonresponsive   Pertinent Labs/Diagnostics Findings: Chemistry:Na+/ K+: 40/4.1 glucose: 192 BUN/Cr.:15/1.19,  AST/ALT: 314/115, alk phos 140, total bilirubin 1.4 CBC: WBC: 16.2  Other Lab findings:   PCT: 0.21 lactic acid: 5.4~>9,  Lipase 284, Troponin: 15 BNP: 52.1, Arterial Blood Gas result:  pO2 93; pCO2 93; pH 7.42;  HCO3 40, %O2 Sat 98.4.  UA:+UTI Imaging:  CXR>No active disease CTH> CT Chest/ Abd/pelvis>No acute abnormality.  Patient given 30 cc/kg of fluids and started on broad-spectrum antibiotics Vanco cefepime and Flagyl for sepsis with septic shock. Patient remained hypotensive despite IVF boluses therefore was started on Levophed. PCCM consulted.   Past Medical History  CAD, ischemic cardiomyopathy, GERD, chronic ulcerative proctosigmoiditis, advanced dementia, DM, and anemia   Significant Hospital Events   8/30: Mated to ICU with sepsis with septic shock   Consults:  GI  Procedures:  None  Significant Diagnostic Tests:  8/30: Chest Xray>No active disease 8/30: Noncontrast CT head> Pending 8/30: CT Chest abdomen and pelvis>1. No bowel obstruction or visible  inflammation. 2. Atelectasis in the left more than right dependent lung. 3. Atherosclerosis including the coronary arteries.  Micro Data:  8/30: SARS-CoV-2 PCR> negative 8/30: Influenza PCR> negative 8/30: Blood culture x2> 8/30: Urine Culture> 8/30: MRSA PCR>>  8/30: Strep pneumo urinary antigen> 8/30: Legionella urinary antigen>  Antimicrobials:  Vancomycin 8/30> Cefepime 8/30> Metronidazole 8/30>  OBJECTIVE  Blood pressure 107/65, pulse 92, temperature 99 F (37.2 C), temperature source Oral, resp. rate 17, height 5' 10"  (1.778 m), weight 118.5 kg, SpO2 100 %.        Intake/Output Summary (Last 24 hours) at 11/16/2021 0539 Last data filed at 11/16/2021 0344 Gross per 24 hour  Intake 1381.78 ml  Output --  Net 1381.78 ml   Filed Weights   11/16/21 0246  Weight: 118.5 kg   Physical Examination  GENERAL: 76 year-old critically ill patient lying in the bed with no acute distress.  EYES: Pupils equal, round, reactive to light and accommodation. No scleral icterus. Extraocular muscles intact.  HEENT: Head atraumatic, normocephalic. Oropharynx and nasopharynx clear.  NECK:  Supple, no jugular venous distention. No thyroid enlargement, no tenderness.  LUNGS: Normal breath sounds bilaterally, no wheezing, rales,rhonchi or crepitation. No use of accessory muscles of respiration.  CARDIOVASCULAR: S1, S2 normal. No murmurs, rubs, or gallops.  ABDOMEN: Soft, nontender, nondistended. Bowel sounds present. No organomegaly or mass.  EXTREMITIES: No pedal edema, cyanosis, or clubbing.  NEUROLOGIC: Cranial nerves II through XII are intact.  Moves all extremities. Sensation intact. Gait not checked.  PSYCHIATRIC: The patient is  lethargic and unresponsive to verbal and tactile stimuli SKIN: No obvious rash, lesion, or ulcer.   Labs/imaging that I havepersonally reviewed  (right  click and "Reselect all SmartList Selections" daily)     Labs   CBC: Recent Labs  Lab 11/16/21 0221   WBC 16.2*  NEUTROABS 14.8*  HGB 13.1  HCT 42.7  MCV 92.6  PLT 381    Basic Metabolic Panel: Recent Labs  Lab 11/16/21 0221  NA 140  K 4.1  CL 101  CO2 25  GLUCOSE 192*  BUN 15  CREATININE 1.09  CALCIUM 8.9   GFR: Estimated Creatinine Clearance: 74.4 mL/min (by C-G formula based on SCr of 1.09 mg/dL). Recent Labs  Lab 11/16/21 0221 11/16/21 0459  PROCALCITON 0.21  --   WBC 16.2*  --   LATICACIDVEN 5.6* >9.0*    Liver Function Tests: Recent Labs  Lab 11/16/21 0221  AST 314*  ALT 115*  ALKPHOS 140*  BILITOT 1.4*  PROT 7.9  ALBUMIN 3.9   Recent Labs  Lab 11/16/21 0218  LIPASE 284*   No results for input(s): "AMMONIA" in the last 168 hours.  ABG No results found for: "PHART", "PCO2ART", "PO2ART", "HCO3", "TCO2", "ACIDBASEDEF", "O2SAT"   Coagulation Profile: Recent Labs  Lab 11/16/21 0221  INR 1.0    Cardiac Enzymes: No results for input(s): "CKTOTAL", "CKMB", "CKMBINDEX", "TROPONINI" in the last 168 hours.  HbA1C: Hgb A1c MFr Bld  Date/Time Value Ref Range Status  07/27/2020 08:19 AM 6.8 (H) 4.8 - 5.6 % Final    Comment:    (NOTE) Pre diabetes:          5.7%-6.4%  Diabetes:              >6.4%  Glycemic control for   <7.0% adults with diabetes   11/28/2015 11:07 PM 6.3 (H) 4.0 - 6.0 % Final    CBG: Recent Labs  Lab 11/16/21 0534  GLUCAP 147*    Review of Systems:   Unable to obtain patient is currently altered and nonverbal at baseline  Past Medical History  He,  has a past medical history of Anemia (~ 11/2015), CAD (coronary artery disease), Dementia ([place]) (dx'd 01/2016), GERD (gastroesophageal reflux disease), History of stomach ulcers (1980s?), and Ischemic cardiomyopathy.   Surgical History    Past Surgical History:  Procedure Laterality Date   CARDIAC CATHETERIZATION N/A 02/10/2016   Procedure: Left Heart Cath and Coronary Angiography;  Surgeon: Lorretta Harp, MD;  Location: [place] [facility];  Service:  Cardiovascular;  Laterality: N/A;   CARDIAC CATHETERIZATION N/A 02/10/2016   Procedure: Coronary Stent Intervention;  Surgeon: Lorretta Harp, MD;  Location: [place] [facility];  Service: Cardiovascular;  Laterality: N/A;   CARDIAC CATHETERIZATION N/A 02/14/2016   Procedure: Coronary Stent Intervention-LAD and CFX;  Surgeon: Lorretta Harp, MD;  Location: [place] [facility];  Service: Cardiovascular;  Laterality: N/A;   COLONOSCOPY WITH PROPOFOL N/A 05/06/2015   Procedure: COLONOSCOPY WITH PROPOFOL;  Surgeon: Hulen Luster, MD;  Location: [facility] ENDOSCOPY;  Service: Gastroenterology;  Laterality: N/A;   CORONARY ANGIOPLASTY WITH STENT PLACEMENT  02/14/2016   "2 stents"   ESOPHAGOGASTRODUODENOSCOPY (EGD) WITH PROPOFOL N/A 05/06/2015   Procedure: ESOPHAGOGASTRODUODENOSCOPY (EGD) WITH PROPOFOL;  Surgeon: Hulen Luster, MD;  Location: [facility] ENDOSCOPY;  Service: Gastroenterology;  Laterality: N/A;   FOREARM FRACTURE SURGERY Right 1985   "got it tore up in [place]"   [place] History   reports that he quit smoking about 33 years ago. His smoking use included cigarettes. He has a 60.00 pack-year smoking history. He has never used  smokeless tobacco. He reports that he does not drink alcohol and does not use drugs.   Family History   His family history includes Prostate cancer in his father. There is no history of Bladder Cancer or Kidney cancer.   Allergies No Known Allergies   Home Medications  Prior to Admission medications   Medication Sig Start Date End Date Taking? Authorizing Provider  aspirin EC 81 MG tablet Take 81 mg by mouth daily.    [provider]  atorvastatin (LIPITOR) 20 MG tablet Take 1 tablet (20 mg total) by mouth daily. Schedule an appointment for further refills 09/21/21   Lorretta Harp, MD  balsalazide (COLAZAL) 750 MG capsule Take 2,250 mg by mouth 3 (three) times daily. 09/23/19   [provider]  clopidogrel (PLAVIX) 75 MG tablet Take 1  tablet (75 mg total) by mouth daily. 09/21/21   Lorretta Harp, MD  escitalopram (LEXAPRO) 5 MG tablet Take 10 mg by mouth daily. 08/29/19   [provider]  ferrous sulfate 325 (65 FE) MG tablet Take 325 mg by mouth daily with breakfast.    [provider]  galantamine (RAZADYNE) 12 MG tablet Take 12 mg by mouth 2 (two) times daily with a meal. 09/02/19   [provider]  lisinopril (ZESTRIL) 10 MG tablet Take 1 tablet (10 mg total) by mouth daily. 09/21/21   Lorretta Harp, MD  memantine (NAMENDA) 10 MG tablet Take 10 mg by mouth 2 (two) times daily. 08/07/19   [provider]  metoprolol tartrate (LOPRESSOR) 25 MG tablet Take 1 tablet (25 mg total) by mouth 2 (two) times daily. 09/21/21   Lorretta Harp, MD  nitroGLYCERIN (NITROSTAT) 0.4 MG SL tablet Place 1 tablet (0.4 mg total) under the tongue every 5 (five) minutes as needed for chest pain. 02/15/16   Eileen Stanford, PA-C  omeprazole (PRILOSEC) 20 MG capsule Take 20 mg by mouth daily.    [provider]  vitamin B-12 (CYANOCOBALAMIN) 1000 MCG tablet Take 1,000 mcg by mouth daily.    [provider]  Scheduled Meds: Continuous Infusions:  sodium chloride Stopped (11/16/21 0539)   lactated ringers 150 mL/hr at 11/16/21 0344   lactated ringers 150 mL/hr at 11/16/21 0341   norepinephrine (LEVOPHED) Adult infusion Stopped (11/16/21 0539)   PRN Meds:.docusate sodium, polyethylene glycol   Active Hospital Problem list     Assessment & Plan:   Sepsis with Septic Shock due to Suspected Intra-abdominal source and possible UTI Severe Lactic Acidosis Lactic: 5.4>9, Baseline PCT: 0.20, UA: + Initial interventions/workup included:4 L of NS/LR & Cefepime/ Vancomycin/ Metronidazole -Supplemental oxygen as needed, to maintain SpO2 > 90% -F/u cultures, trend lactic/ PCT -Monitor WBC/ fever curve -IV antibiotics vancomycin & Zosyn -IVF hydration as needed -Pressors for MAP goal  >65 -Strict I/O's    Elevated LFTs Concerns for cholangiopathy although cannot rule out shock liver given sepsis as above -Lipase elevated -CT abdomen pelvis negative -US abdomen pending -Check hepatitis panel -Lipid profile, CK and TSH -GI consulted for possible MRCP   Acute Metabolic Encephalopathy ~ Hx: Advanced Dementia nonverbal at baseline -Provide supportive care -Promote normal sleep/wake cycle -Avoid sedating meds as able -Check Ammonia Levels -CT Head pending   Diabetes mellitus HgbA1c pending -CBG's AC & hs; Target range of 140 to 180 -SSI -Follow ICU Hypo/Hyperglycemia protocol   Best practice:  Diet:  NPO Pain/Anxiety/Delirium protocol (if indicated): No VAP protocol (if indicated): Not indicated DVT prophylaxis: Contraindicated GI prophylaxis:  PPI Glucose control:  SSI No Central venous access:  N/A Arterial line:  N/A Foley:  Yes, and it is still needed Mobility:  bed rest  PT consulted: N/A Last date of multidisciplinary goals of care discussion [8/30] Code Status:  full code Disposition: ICU   = Goals of Care = Code Status Order: FULL  Primary Emergency Contact: Lashawn, [place], Home Phone: [phone] Wishes to pursue full aggressive treatment and intervention options, including CPR and intubation, but goals of care will be addressed on going with family if that should become necessary.   Critical care time: 45 minutes       Rufina Falco, DNP, CCRN, FNP-C, AGACNP-BC Acute Care Nurse Practitioner [facility]  PCCM on call pager [phone] until 7 am

## 2021-11-16 NOTE — Consult Note (Addendum)
[name], MD [address]  [place]  [place], [place] [address]  Main: [phone]  Fax: [phone] Pager: [phone]   Consultation  Referring Provider:     No ref. provider found Primary Care Physician:  Tracie Harrier, MD Primary Gastroenterologist: [facility]    Reason for Consultation: Elevated LFTs  Date of Admission:  11/16/2021 Date of Consultation:  11/16/2021         HPI:   Jeff Key is a 76 y.o. male history of coronary artery disease, STEMI, s/p PCI with DES in 2017, ischemic cardiomyopathy, EF of 45 to 50%, dementia.  Patient presented from home with nausea and vomiting.  He was febrile 102.2, tachycardia, altered mental status, initiated on sepsis protocol secondary to UTI and admitted to ICU.  He was found to have elevated serum lipase 284, AST/ALT 314/115, alkaline phosphatase 140, total bilirubin 1.4, leukocytosis WBC count 16.2.  He underwent CT chest abdomen pelvis with no acute abnormality.  GI is consulted for elevated LFTs.  CT did not reveal any gallstones or choledocholithiasis.  Patient is currently being treated for severe sepsis, on broad-spectrum antibiotics.  Levophed has been ordered but never started because he responded to IV fluids  Patient is diagnosed with left-sided ulcerative colitis in 2017, maintained on balsalazide, managed by [facility] gastroenterology.  Patient has advanced dementia, last seen by GI in 05/2021, I did not see any follow-up colonoscopy to confirm if colitis is in endoscopic remission on balsalazide  Patient's wife and daughters are bedside.  At baseline, patient is nonverbal, fully dependent on ADLs.  His wife is his caregiver.  Patient stopped nausea and vomiting upon arrival to ICU.  NSAIDs: None  Antiplts/Anticoagulants/Anti thrombotics: Aspirin and Plavix for history of coronary artery disease, s/p PCI with DES  GI Procedures: Colonoscopy in 2017  Past Medical  History:  Diagnosis Date   Anemia ~ 11/2015   CAD (coronary artery disease)    a. inf STEMI on 02/10/16. RCA treated with DES followed by staged PCI of LAD and Circumflex 02/14/16.   Dementia ([place]) dx'd 01/2016   GERD (gastroesophageal reflux disease)    History of stomach ulcers 1980s?   Ischemic cardiomyopathy    a. 01/2016: EF 40-45%.     Past Surgical History:  Procedure Laterality Date   CARDIAC CATHETERIZATION N/A 02/10/2016   Procedure: Left Heart Cath and Coronary Angiography;  Surgeon: Lorretta Harp, MD;  Location: [place] [facility];  Service: Cardiovascular;  Laterality: N/A;   CARDIAC CATHETERIZATION N/A 02/10/2016   Procedure: Coronary Stent Intervention;  Surgeon: Lorretta Harp, MD;  Location: [place] [facility];  Service: Cardiovascular;  Laterality: N/A;   CARDIAC CATHETERIZATION N/A 02/14/2016   Procedure: Coronary Stent Intervention-LAD and CFX;  Surgeon: Lorretta Harp, MD;  Location: [place] [facility];  Service: Cardiovascular;  Laterality: N/A;   COLONOSCOPY WITH PROPOFOL N/A 05/06/2015   Procedure: COLONOSCOPY WITH PROPOFOL;  Surgeon: Hulen Luster, MD;  Location: [facility] ENDOSCOPY;  Service: Gastroenterology;  Laterality: N/A;   CORONARY ANGIOPLASTY WITH STENT PLACEMENT  02/14/2016   "2 stents"   ESOPHAGOGASTRODUODENOSCOPY (EGD) WITH PROPOFOL N/A 05/06/2015   Procedure: ESOPHAGOGASTRODUODENOSCOPY (EGD) WITH PROPOFOL;  Surgeon: Hulen Luster, MD;  Location: [facility] ENDOSCOPY;  Service: Gastroenterology;  Laterality: N/A;   FOREARM FRACTURE SURGERY Right 1985   "got it tore up in [place]"   FRACTURE SURGERY       Current Facility-Administered Medications:    0.9 %  sodium chloride infusion, 250 mL, Intravenous, Continuous, Ouma, Bing Neighbors, NP, Held at 11/16/21 6283   docusate sodium (COLACE) capsule 100 mg, 100 mg, Oral, BID PRN, Lang Snow, NP   lactated ringers infusion, , Intravenous, Continuous, Ward, Delice Bison, DO, Last Rate: 150 mL/hr at  11/16/21 0341, New Bag at 11/16/21 0341   norepinephrine (LEVOPHED) 68m in 2566m(0.016 mg/mL) premix infusion, 2-10 mcg/min, Intravenous, Titrated, Ouma, ElBing NeighborsNP, Held at 11/16/21 051517 Oral care mouth rinse, 15 mL, Mouth Rinse, PRN, KaFlora LippsMD   Oral care mouth rinse, 15 mL, Mouth Rinse, 4 times per day, KaFlora LippsMD   Oral care mouth rinse, 15 mL, Mouth Rinse, PRN, KaMortimer FriesKurian, MD   piperacillin-tazobactam (ZOSYN) IVPB 3.375 g, 3.375 g, Intravenous, Q8H, Kasa, Kurian, MD   polyethylene glycol (MIRALAX / GLYCOLAX) packet 17 g, 17 g, Oral, Daily PRN, OuLang SnowNP   [START ON 11/17/2021] vancomycin (VANCOREADY) IVPB 1250 mg/250 mL, 1,250 mg, Intravenous, Q24H, Kasa, Kurian, MD   Family History  Problem Relation Age of Onset   Prostate cancer Father    Bladder Cancer Neg Hx    Kidney cancer Neg Hx      Social History   Tobacco Use   Smoking status: Former    Packs/day: 2.00    Years: 30.00    Total pack years: 60.00    Types: Cigarettes    Quit date: 1990    Years since quitting: 33.6   Smokeless tobacco: Never  Substance Use Topics   Alcohol use: No    Alcohol/week: 0.0 standard drinks of alcohol   Drug use: No    Allergies as of 11/16/2021   (No Known Allergies)    Review of Systems:    All systems reviewed and negative except where noted in HPI.   Physical Exam:  Vital signs in last 24 hours: Temp:  [99 F (37.2 C)-102.2 F (39 C)] 99.4 F (37.4 C) (08/30 0800) Pulse Rate:  [56-123] 89 (08/30 0900) Resp:  [15-30] 19 (08/30 0830) BP: (91-148)/(53-105) 91/53 (08/30 0830) SpO2:  [92 %-100 %] 95 % (08/30 0900) Weight:  [67.5 kg-118.5 kg] 67.5 kg (08/30 0630)   General:   Non verbal, not in distress Head:  Normocephalic and atraumatic. Eyes:   No icterus.   Conjunctiva pink. PERRLA. Ears:  Normal auditory acuity. Neck:  Supple; no masses or thyroidomegaly Lungs: Respirations even and unlabored. Lungs clear to auscultation  bilaterally.   No wheezes, crackles, or rhonchi.  Heart:  Regular rate and rhythm;  Without murmur, clicks, rubs or gallops Abdomen:  Soft, nondistended, nontender. Normal bowel sounds. No appreciable masses or hepatomegaly.  No rebound or guarding.  Rectal:  Not performed. Msk:  Symmetrical without gross deformities.  Unable to assess Extremities:  Without edema, cyanosis or clubbing. Neurologic: Nonverbal Skin:  Intact without significant lesions or rashes.   LAB RESULTS:    Latest Ref Rng & Units 11/16/2021    2:21 AM 07/29/2020    3:25 AM 07/27/2020    8:19 AM  CBC  WBC 4.0 - 10.5 K/uL 16.2  9.9  13.0   Hemoglobin 13.0 - 17.0 g/dL 13.1  10.5  9.9   Hematocrit 39.0 - 52.0 % 42.7  33.5  31.4   Platelets 150 - 400 K/uL 294  282  262     BMET    Latest Ref Rng & Units 11/16/2021    2:21 AM 07/29/2020    3:25 AM  07/27/2020    8:19 AM  BMP  Glucose 70 - 99 mg/dL 192   129   BUN 8 - 23 mg/dL 15   25   Creatinine 0.61 - 1.24 mg/dL 1.09   1.19   Sodium 135 - 145 mmol/L 140   136   Potassium 3.5 - 5.1 mmol/L 4.1  3.5  3.9   Chloride 98 - 111 mmol/L 101   102   CO2 22 - 32 mmol/L 25   25   Calcium 8.9 - 10.3 mg/dL 8.9   7.9     LFT    Latest Ref Rng & Units 11/16/2021    2:21 AM 07/27/2020    8:19 AM 07/26/2020    7:30 PM  Hepatic Function  Total Protein 6.5 - 8.1 g/dL 7.9  6.6  8.9   Albumin 3.5 - 5.0 g/dL 3.9  3.0  4.2   AST 15 - 41 U/L 314  22  28   ALT 0 - 44 U/L 115  12  17   Alk Phosphatase 38 - 126 U/L 140  60  106   Total Bilirubin 0.3 - 1.2 mg/dL 1.4  0.5  0.7      STUDIES: US ABDOMEN LIMITED RUQ (LIVER/GB)  Result Date: 11/16/2021 CLINICAL DATA:  Elevated liver function tests EXAM: ULTRASOUND ABDOMEN LIMITED RIGHT UPPER QUADRANT COMPARISON:  Abdominal CT from earlier the same day FINDINGS: Gallbladder: Small gallstones which are in retrospect confirmed on prior CT. No focal tenderness detected. No wall thickening Common bile duct: Diameter: 3 mm Liver: There is  diffusely echogenic liver with diminished acoustic penetration. Portal vein is patent on color Doppler imaging with normal direction of blood flow towards the liver. IMPRESSION: 1. Hepatic steatosis. 2. Tiny gallbladder calculi. 3. No acute finding Electronically Signed   By: Jorje Guild M.D.   On: 11/16/2021 07:52   CT HEAD WO CONTRAST (5MM)  Result Date: 11/16/2021 CLINICAL DATA:  Mental status change with unknown cause EXAM: CT HEAD WITHOUT CONTRAST TECHNIQUE: Contiguous axial images were obtained from the base of the skull through the vertex without intravenous contrast. RADIATION DOSE REDUCTION: This exam was performed according to the departmental dose-optimization program which includes automated exposure control, adjustment of the mA and/or kV according to patient size and/or use of iterative reconstruction technique. COMPARISON:  10/13/2019 FINDINGS: Brain: No evidence of acute infarction, hemorrhage, hydrocephalus, extra-axial collection or mass lesion/mass effect. Motion artifact. Advanced brain atrophy. Chronic small vessel ischemia to a moderate degree. Vascular: Atheromatous calcification Skull: No acute or aggressive finding. Sinuses/Orbits: Interval opacification of the left frontal, maxillary, and ethmoid sinuses, obstruction at the middle meatus. IMPRESSION: 1. Motion degraded head CT without acute finding. 2. Advanced brain atrophy.  Chronic small vessel ischemia. 3. Interval left-sided sinusitis with middle meatus obstruction. Electronically Signed   By: Jorje Guild M.D.   On: 11/16/2021 06:26   CT CHEST ABDOMEN PELVIS W CONTRAST  Result Date: 11/16/2021 CLINICAL DATA:  Sepsis. Nausea and vomiting that started 2 hours ago with tremors EXAM: CT CHEST, ABDOMEN, AND PELVIS WITH CONTRAST TECHNIQUE: Multidetector CT imaging of the chest, abdomen and pelvis was performed following the standard protocol during bolus administration of intravenous contrast. RADIATION DOSE REDUCTION: This  exam was performed according to the departmental dose-optimization program which includes automated exposure control, adjustment of the mA and/or kV according to patient size and/or use of iterative reconstruction technique. CONTRAST:  170m OMNIPAQUE IOHEXOL 300 MG/ML  SOLN COMPARISON:  07/26/2020 FINDINGS: CT CHEST  FINDINGS Cardiovascular: Normal heart size. No pericardial effusion. Extensive atheromatous calcification of the aorta and coronaries. Mediastinum/Nodes: Negative for adenopathy or mass. Lungs/Pleura: Dependent opacity in the left more than right lungs with volume loss. No definite pneumonia. No edema or effusion. Musculoskeletal: Generalized spondylosis CT ABDOMEN PELVIS FINDINGS Hepatobiliary: Hepatic steatosis.No evidence of biliary obstruction or stone. Pancreas: Unremarkable. Spleen: Unremarkable. Adrenals/Urinary Tract: Negative adrenals. No hydronephrosis or stone. Unremarkable bladder. Stomach/Bowel:  No obstruction. No appendicitis. Vascular/Lymphatic: No acute vascular abnormality. Extensive atheromatous calcification of the aorta and branch vessels. No mass or adenopathy. Reproductive:No pathologic findings. Other: No ascites or pneumoperitoneum. Musculoskeletal: No acute abnormalities. Ordinary lumbar spine degeneration. IMPRESSION: 1. No bowel obstruction or visible inflammation. 2. Atelectasis in the left more than right dependent lung. 3. Atherosclerosis including the coronary arteries. Electronically Signed   By: Jorje Guild M.D.   On: 11/16/2021 05:11   DG Chest Port 1 View  Result Date: 11/16/2021 CLINICAL DATA:  Possible sepsis EXAM: PORTABLE CHEST 1 VIEW COMPARISON:  07/28/2020 FINDINGS: Heart is mildly enlarged in size. Aortic calcifications are noted. Elevation of the right hemidiaphragm is seen. No focal infiltrate is noted. Abnormality is seen. IMPRESSION: No active disease. Electronically Signed   By: Inez Catalina M.D.   On: 11/16/2021 03:16      Impression / Plan:    Jeff Key is a 76 y.o. male with history of ischemic cardiomyopathy, coronary artery disease s/p PCI with DES on aspirin and Plavix is admitted with severe sepsis, abnormal UA otherwise no clear source of sepsis identified.  He does have elevated LFTs and elevated serum lipase, cholelithiasis  Elevated LFTs with elevated serum lipase and cholelithiasis Patient likely has gallstone pancreatitis without evidence of ascending cholangitis Ultrasound and CT abdomen with contrast did not reveal choledocholithiasis or dilated CBD.  No evidence of acute cholecystitis.  I do not suspect ascending cholangitis Elevated LFTs are likely in the setting of severe sepsis Monitor LFTs closely Continue aggressive fluid resuscitation Maintain n.p.o. except ice chips for next 24 hours Work-up for source of infection is in process Agree with acute viral hepatitis panel  Discussed my recommendations with patient's family Thank you for involving me in the care of this patient    LOS: 0 days   Sherri Sear, MD  11/16/2021, 9:21 AM    Note: This dictation was prepared with Dragon dictation along with smaller phrase technology. Any transcriptional errors that result from this process are unintentional.

## 2021-11-16 NOTE — Consult Note (Signed)
PHARMACY CONSULT NOTE - FOLLOW UP  Pharmacy Consult for Electrolyte Monitoring and Replacement   Recent Labs: Potassium (mmol/L)  Date Value  11/16/2021 4.1   Magnesium (mg/dL)  Date Value  85/27/7824 1.7   Calcium (mg/dL)  Date Value  23/53/6144 8.9   Albumin (g/dL)  Date Value  31/54/0086 3.9  05/26/2016 4.4   Phosphorus (mg/dL)  Date Value  76/19/5093 3.1   Sodium (mmol/L)  Date Value  11/16/2021 140     Assessment: 75yo M w/ h/o CAD, STEMI (s/p DES '17), iCMP (EF 45-50%), dementia, & UC presenting from home with N/V, AMS, fever of 102.2, and septic 2/2 UTI & pancreatitis (lipase 284, AST/ALT 314/115). CT chest no abnormality and did not reveal any gallstones or choledocholithiasis. Pharmacy consulted for mgmt of electrolytes in CCU.  Goal of Therapy:  K>4, Mg>2  Plan:  Scr 1.09 (unk BL); I/O Net +1.1L K 4.1 - WNL, no repletion at this time. Mg NNL - Will order with [facility] for AM collection 8/31. Other lytes WNL, will CTM daily with AM labs.   Martyn Malay ,PharmD Clinical Pharmacist 11/16/2021 9:01 AM

## 2021-11-16 NOTE — ED Provider Notes (Signed)
[facility] Provider Note    Event Date/Time   First MD Initiated Contact with Patient 11/16/21 0203     (approximate)   History   Emesis   HPI  Jeff Key is a 76 y.o. male with history of dementia who is mostly nonverbal at baseline, CAD, ischemic cardiomyopathy who presents to the emergency department with EMS for concerns for nausea, vomiting and diarrhea that started tonight around midnight.  Found to be febrile here at 102.2.  Patient is unable to answer any questions but is awake, alert and moving all extremities.   History provided by EMS and patient's family.  Level 5 caveat secondary to dementia.    Past Medical History:  Diagnosis Date   Anemia ~ 11/2015   CAD (coronary artery disease)    a. inf STEMI on 02/10/16. RCA treated with DES followed by staged PCI of LAD and Circumflex 02/14/16.   Dementia (HCC) dx'd 01/2016   GERD (gastroesophageal reflux disease)    History of stomach ulcers 1980s?   Ischemic cardiomyopathy    a. 01/2016: EF 40-45%.     Past Surgical History:  Procedure Laterality Date   CARDIAC CATHETERIZATION N/A 02/10/2016   Procedure: Left Heart Cath and Coronary Angiography;  Surgeon: Runell Gess, MD;  Location: [facility] INVASIVE CV LAB;  Service: Cardiovascular;  Laterality: N/A;   CARDIAC CATHETERIZATION N/A 02/10/2016   Procedure: Coronary Stent Intervention;  Surgeon: Runell Gess, MD;  Location: [facility] INVASIVE CV LAB;  Service: Cardiovascular;  Laterality: N/A;   CARDIAC CATHETERIZATION N/A 02/14/2016   Procedure: Coronary Stent Intervention-LAD and CFX;  Surgeon: Runell Gess, MD;  Location: [facility] INVASIVE CV LAB;  Service: Cardiovascular;  Laterality: N/A;   COLONOSCOPY WITH PROPOFOL N/A 05/06/2015   Procedure: COLONOSCOPY WITH PROPOFOL;  Surgeon: Wallace Cullens, MD;  Location: [facility] ENDOSCOPY;  Service: Gastroenterology;  Laterality: N/A;   CORONARY ANGIOPLASTY WITH STENT PLACEMENT  02/14/2016   "2 stents"    ESOPHAGOGASTRODUODENOSCOPY (EGD) WITH PROPOFOL N/A 05/06/2015   Procedure: ESOPHAGOGASTRODUODENOSCOPY (EGD) WITH PROPOFOL;  Surgeon: Wallace Cullens, MD;  Location: [facility] ENDOSCOPY;  Service: Gastroenterology;  Laterality: N/A;   FOREARM FRACTURE SURGERY Right 1985   "got it tore up in [place]"   FRACTURE SURGERY      MEDICATIONS:  Prior to Admission medications   Medication Sig Start Date End Date Taking? Authorizing Provider  aspirin EC 81 MG tablet Take 81 mg by mouth daily.    [provider]  atorvastatin (LIPITOR) 20 MG tablet Take 1 tablet (20 mg total) by mouth daily. Schedule an appointment for further refills 09/21/21   Runell Gess, MD  balsalazide (COLAZAL) 750 MG capsule Take 2,250 mg by mouth 3 (three) times daily. 09/23/19   [provider]  clopidogrel (PLAVIX) 75 MG tablet Take 1 tablet (75 mg total) by mouth daily. 09/21/21   Runell Gess, MD  escitalopram (LEXAPRO) 5 MG tablet Take 10 mg by mouth daily. 08/29/19   [provider]  ferrous sulfate 325 (65 FE) MG tablet Take 325 mg by mouth daily with breakfast.    [provider]  galantamine (RAZADYNE) 12 MG tablet Take 12 mg by mouth 2 (two) times daily with a meal. 09/02/19   [provider]  lisinopril (ZESTRIL) 10 MG tablet Take 1 tablet (10 mg total) by mouth daily. 09/21/21   Runell Gess, MD  memantine (NAMENDA) 10 MG tablet Take 10 mg by mouth 2 (two) times  daily. 08/07/19   [provider]  metoprolol tartrate (LOPRESSOR) 25 MG tablet Take 1 tablet (25 mg total) by mouth 2 (two) times daily. 09/21/21   Runell Gess, MD  nitroGLYCERIN (NITROSTAT) 0.4 MG SL tablet Place 1 tablet (0.4 mg total) under the tongue every 5 (five) minutes as needed for chest pain. 02/15/16   Janetta Hora, PA-C  omeprazole (PRILOSEC) 20 MG capsule Take 20 mg by mouth daily.    [provider]  vitamin B-12 (CYANOCOBALAMIN) 1000 MCG tablet Take 1,000 mcg by mouth daily.     [provider]    Physical Exam   Triage Vital Signs: ED Triage Vitals [11/16/21 0216]  Enc Vitals Group     BP (!) 148/105     Pulse Rate (!) 123     Resp (!) 30     Temp (!) 102.2 F (39 C)     Temp Source Rectal     SpO2 94 %     Weight      Height      Head Circumference      Peak Flow      Pain Score      Pain Loc      Pain Edu?      Excl. in GC?     Most recent vital signs: Vitals:   11/16/21 0530 11/16/21 0545  BP: 107/65 111/65  Pulse: 92 96  Resp: 17 (!) 21  Temp:    SpO2: 100% 100%    CONSTITUTIONAL: Alert and awake but does not answer questions or follow commands.  Elderly.  Obese. HEAD: Normocephalic, atraumatic EYES: Conjunctivae clear, pupils appear equal, sclera nonicteric ENT: normal nose; moist mucous membranes NECK: Supple, normal ROM CARD: Regular and tachycardic; S1 and S2 appreciated; no murmurs, no clicks, no rubs, no gallops RESP: Slightly tachypneic, breath sounds clear and equal bilaterally; no wheezes, no rhonchi, no rales, no hypoxia or respiratory distress, speaking full sentences ABD/GI: Normal bowel sounds; non-distended; soft, non-tender, no rebound, no guarding, no peritoneal signs BACK: The back appears normal EXT: Normal ROM in all joints; no deformity noted, no edema; no cyanosis SKIN: Normal color for age and race; warm; no rash on exposed skin NEURO: Moves all extremities equally, does not answer questions or follow commands.  Opens eyes spontaneously.   ED Results / Procedures / Treatments   LABS: (all labs ordered are listed, but only abnormal results are displayed) Labs Reviewed  LACTIC ACID, PLASMA - Abnormal; Notable for the following components:      Result Value   Lactic Acid, Venous 5.6 (*)    All other components within normal limits  COMPREHENSIVE METABOLIC PANEL - Abnormal; Notable for the following components:   Glucose, Bld 192 (*)    AST 314 (*)    ALT 115 (*)    Alkaline Phosphatase 140 (*)     Total Bilirubin 1.4 (*)    All other components within normal limits  CBC WITH DIFFERENTIAL/PLATELET - Abnormal; Notable for the following components:   WBC 16.2 (*)    Neutro Abs 14.8 (*)    All other components within normal limits  URINALYSIS, COMPLETE (UACMP) WITH MICROSCOPIC - Abnormal; Notable for the following components:   Color, Urine AMBER (*)    APPearance HAZY (*)    Hgb urine dipstick MODERATE (*)    Leukocytes,Ua LARGE (*)    RBC / HPF >50 (*)    WBC, UA >50 (*)    Bacteria, UA  RARE (*)    All other components within normal limits  LACTIC ACID, PLASMA - Abnormal; Notable for the following components:   Lactic Acid, Venous >9.0 (*)    All other components within normal limits  LIPASE, BLOOD - Abnormal; Notable for the following components:   Lipase 284 (*)    All other components within normal limits  CBG MONITORING, ED - Abnormal; Notable for the following components:   Glucose-Capillary 147 (*)    All other components within normal limits  RESP PANEL BY RT-PCR (FLU A&B, COVID) ARPGX2  CULTURE, BLOOD (ROUTINE X 2)  CULTURE, BLOOD (ROUTINE X 2)  URINE CULTURE  PROTIME-INR  APTT  PROCALCITONIN  BRAIN NATRIURETIC PEPTIDE  BLOOD GAS, ARTERIAL  LEGIONELLA PNEUMOPHILA SEROGP 1 UR AG  STREP PNEUMONIAE URINARY ANTIGEN  AMMONIA  TROPONIN I (HIGH SENSITIVITY)  TROPONIN I (HIGH SENSITIVITY)     EKG:  EKG Interpretation  Date/Time:  Wednesday November 16 2021 02:05:42 EDT Ventricular Rate:  120 PR Interval:    QRS Duration: 89 QT Interval:  311 QTC Calculation: 442 R Axis:   -17 Text Interpretation: Sinus tachycardia Abnormal R-wave progression, early transition Inferior infarct, age indeterminate Lateral leads are also involved Artifact in lead(s) I II III aVR aVL aVF V1 V2 V3 Confirmed by Rochele Raring [id number]) on 11/16/2021 2:12:59 AM         RADIOLOGY: My personal review and interpretation of imaging: CT chest abdomen pelvis shows no source of sepsis.   Chest x-ray clear.  I have personally reviewed all radiology reports.   CT CHEST ABDOMEN PELVIS W CONTRAST  Result Date: 11/16/2021 CLINICAL DATA:  Sepsis. Nausea and vomiting that started 2 hours ago with tremors EXAM: CT CHEST, ABDOMEN, AND PELVIS WITH CONTRAST TECHNIQUE: Multidetector CT imaging of the chest, abdomen and pelvis was performed following the standard protocol during bolus administration of intravenous contrast. RADIATION DOSE REDUCTION: This exam was performed according to the departmental dose-optimization program which includes automated exposure control, adjustment of the mA and/or kV according to patient size and/or use of iterative reconstruction technique. CONTRAST:  OMNIPAQUE IOHEXOL 300 MG/ML  SOLN COMPARISON:  07/26/2020 FINDINGS: CT CHEST FINDINGS Cardiovascular: Normal heart size. No pericardial effusion. Extensive atheromatous calcification of the aorta and coronaries. Mediastinum/Nodes: Negative for adenopathy or mass. Lungs/Pleura: Dependent opacity in the left more than right lungs with volume loss. No definite pneumonia. No edema or effusion. Musculoskeletal: Generalized spondylosis CT ABDOMEN PELVIS FINDINGS Hepatobiliary: Hepatic steatosis.No evidence of biliary obstruction or stone. Pancreas: Unremarkable. Spleen: Unremarkable. Adrenals/Urinary Tract: Negative adrenals. No hydronephrosis or stone. Unremarkable bladder. Stomach/Bowel:  No obstruction. No appendicitis. Vascular/Lymphatic: No acute vascular abnormality. Extensive atheromatous calcification of the aorta and branch vessels. No mass or adenopathy. Reproductive:No pathologic findings. Other: No ascites or pneumoperitoneum. Musculoskeletal: No acute abnormalities. Ordinary lumbar spine degeneration. IMPRESSION: 1. No bowel obstruction or visible inflammation. 2. Atelectasis in the left more than right dependent lung. 3. Atherosclerosis including the coronary arteries. Electronically Signed   By: Tiburcio Pea  M.D.   On: 11/16/2021 05:11   DG Chest Port 1 View  Result Date: 11/16/2021 CLINICAL DATA:  Possible sepsis EXAM: PORTABLE CHEST 1 VIEW COMPARISON:  07/28/2020 FINDINGS: Heart is mildly enlarged in size. Aortic calcifications are noted. Elevation of the right hemidiaphragm is seen. No focal infiltrate is noted. Abnormality is seen. IMPRESSION: No active disease. Electronically Signed   By: Alcide Clever M.D.   On: 11/16/2021 03:16     PROCEDURES:  Critical Care performed:  Yes, see critical care procedure note(s)   CRITICAL CARE Performed by: Rochele RaringKristen Azzan Butler   Total critical care time: 45 minutes  Critical care time was exclusive of separately billable procedures and treating other patients.  Critical care was necessary to treat or prevent imminent or life-threatening deterioration.  Critical care was time spent personally by me on the following activities: development of treatment plan with patient and/or surrogate as well as nursing, discussions with consultants, evaluation of patient's response to treatment, examination of patient, obtaining history from patient or surrogate, ordering and performing treatments and interventions, ordering and review of laboratory studies, ordering and review of radiographic studies, pulse oximetry and re-evaluation of patient's condition.   Marland Kitchen.1-3 Lead EKG Interpretation  Performed by: Alysia Scism, Layla MawKristen N, DO Authorized by: Mara Favero, Layla MawKristen N, DO     Interpretation: abnormal     ECG rate:  121   ECG rate assessment: tachycardic     Rhythm: sinus tachycardia     Ectopy: none     Conduction: normal       IMPRESSION / MDM / ASSESSMENT AND PLAN / ED COURSE  I reviewed the triage vital signs and the nursing notes.    Patient here with fever, nausea, vomiting and diarrhea.  The patient is on the cardiac monitor to evaluate for evidence of arrhythmia and/or significant heart rate changes.   DIFFERENTIAL DIAGNOSIS (includes but not limited to):   Sepsis,  gastroenteritis, colitis, diverticulitis, appendicitis, bowel obstruction, UTI, pneumonia, COVID, other viral illness, bacteremia   Patient's presentation is most consistent with acute presentation with potential threat to life or bodily function.   PLAN: Code sepsis initiated given patient is febrile, tachycardic and tachypneic.  Will obtain CBC, CMP, lipase, urinalysis, troponin, chest x-ray, CT of the abdomen pelvis, give IV fluids, broad-spectrum antibiotics, antipyretics, antiemetics.  Anticipate admission.  Cultures pending.   MEDICATIONS GIVEN IN ED: Medications  lactated ringers infusion ( Intravenous New Bag/Given 11/16/21 0344)  lactated ringers infusion ( Intravenous New Bag/Given 11/16/21 0341)  docusate sodium (COLACE) capsule 100 mg (has no administration in time range)  polyethylene glycol (MIRALAX / GLYCOLAX) packet 17 g (has no administration in time range)  0.9 %  sodium chloride infusion (0 mLs Intravenous Hold 11/16/21 0539)  norepinephrine (LEVOPHED) 4mg  in 250mL (0.016 mg/mL) premix infusion (0 mcg/min Intravenous Hold 11/16/21 0539)  lactated ringers bolus 1,000 mL (0 mLs Intravenous Stopped 11/16/21 0339)  ceFEPIme (MAXIPIME) 2 g in sodium chloride 0.9 % 100 mL IVPB (0 g Intravenous Stopped 11/16/21 0310)  metroNIDAZOLE (FLAGYL) IVPB 500 mg (0 mg Intravenous Stopped 11/16/21 0344)  vancomycin (VANCOCIN) IVPB 1000 mg/200 mL premix (0 mg Intravenous Stopped 11/16/21 0344)  ondansetron (ZOFRAN) injection 4 mg (4 mg Intravenous Given 11/16/21 0245)  acetaminophen (TYLENOL) suppository 650 mg (650 mg Rectal Given 11/16/21 0237)  vancomycin (VANCOREADY) IVPB 1500 mg/300 mL (0 mg Intravenous Stopped 11/16/21 0544)  lactated ringers bolus 1,500 mL (0 mLs Intravenous Stopped 11/16/21 0522)  lactated ringers bolus 1,000 mL (1,000 mLs Intravenous New Bag/Given 11/16/21 0420)  iohexol (OMNIPAQUE) 300 MG/ML solution 100 mL (100 mLs Intravenous Contrast Given 11/16/21 0432)  lactated ringers  bolus 1,000 mL (1,000 mLs Intravenous New Bag/Given 11/16/21 0552)     ED COURSE: Patient's white blood cell count is 16,000 with left shift.  Lactic of 5.6.  He is getting 30 mL/kg IV fluid bolus based on his ideal body weight.  Will monitor closely for respiratory distress given history of cardiomyopathy.  COVID and flu negative.  He has elevated liver function test and family reports that he still has his gallbladder.  Chest x-ray obtained and shows no acute abnormality.  CT of the chest, abdomen pelvis pending.  Pt may need MRCP as well to evaluate for choledocholithiasis.  We will add on lipase.    4:50 AM  Patient's lipase has come back elevated at 284.  Patient may need an MRCP but at this time his blood pressure is dropping and I do not feel he is stable enough to go to MRI for an extended period of time.  We will give another liter fluid bolus and continue to closely monitor.  Elevated liver function test could also be from shock liver due to severe sepsis.  He does not seem exquisitely tender in the right upper quadrant but exam is very limited due to his nonverbal status and dementia.    5:25 AM  CT scans reviewed and interpreted by myself and the radiologist and show no acute abnormality.  Biliary tree, liver and gallbladder appear normal other than 1 possible gallstone.  We discussed elevated liver function test and lipase.  While the elevated liver function test could be from shock liver I am concerned about the elevated lipase and that he could have choledocholithiasis, cholangitis.  We will add on a right upper quadrant ultrasound.  I have discussed the case with Webb Silversmith, NP on-call for critical care.  She will admit to the ICU.  Patient seems more somnolent than before but is protecting his airway.  We will recheck CBG, ABG and obtain a head CT.  ICU provider also ordered an ammonia level.   CBG normal.  Repeat lactic is uptrending to greater than 9.  He has received 3.5 L of  IV fluids.  Will give fourth liter.  Nurse updated.  Levophed has been ordered to start if blood pressures drop below 90 systolic or maps under 65.  Family has been updated multiple times due to patient's critical illness.   5:50 AM  ABG is normal.  Nurse will start the fourth liter of IV fluids and obtain the ammonia level and then patient will go directly to CT scan and then to the ICU.  Right upper quadrant ultrasound will still be pending.  I did update the critical care nurse practitioner.  We discussed possibly ordering an MRCP to look for choledocholithiasis, cholangitis, pancreatitis but given patient is so ill, I do not feel he is safe to be in MRI for an extended period of time currently.  CONSULTS: Critical care consulted for admission for severe sepsis likely from UTI.  Appreciate critical care help.   5:58 AM  GI consulted as well due to concerns for elevated liver function test with elevated lipase and total bilirubin and potential need for ERCP in the future.  Discussed with Dr. Tobi Bastos.  He agrees that patient sounds like he is too unstable for MRCP.  It does look like Dr. Servando Snare is on call per Amion with "[facility] outpatient phone call" 8/30 and 8/31.  Dr. Tobi Bastos reports patient could get also get a cholecystostomy tube by IR if needed for decompression.  ICU team updated with this consult.  Secure chat with patient information sent to Dr. Tobi Bastos.  Appreciate GI help.  OUTSIDE RECORDS REVIEWED:  Reviewed patient's last neurology note on 11/07/2021 with Dr. Sherryll Burger.       FINAL CLINICAL IMPRESSION(S) / ED DIAGNOSES   Final diagnoses:  Nausea vomiting and diarrhea  Severe sepsis with  septic shock (HCC)  Acute UTI  Elevated liver function tests  Lactic acidosis     Rx / DC Orders   ED Discharge Orders     None        Note:  This document was prepared using Dragon voice recognition software and may include unintentional dictation errors.   Seva Chancy, Layla Maw, DO 11/16/21  [phone]

## 2021-11-16 NOTE — Progress Notes (Signed)
eLink Physician-Brief Progress Note Patient Name: ASANI MCBURNEY DOB: [birth year] MRN: [id number]   Date of Service  11/16/2021  HPI/Events of Note  61M with dementia, ICM, GERD who p/w N/V and tremors. Admitted for septic shock s/p 4.5L LR.  Weaned off levo. Patient appears comfortable and resting  WBC 16.2 LA> 9 AST 314 ALT 115  eICU Interventions  Broad spectrum antibiotics for UTI+/- abdominal source. Culture data pending Currently off pressors. MAP goal >65 mIVF     Intervention Category Evaluation Type: New Patient Evaluation  Cobey Raineri Mechele Collin 11/16/2021, 6:34 AM

## 2021-11-16 NOTE — ED Notes (Signed)
Pt to CT

## 2021-11-17 ENCOUNTER — Other Ambulatory Visit: Payer: Self-pay

## 2021-11-17 DIAGNOSIS — E872 Acidosis, unspecified: Secondary | ICD-10-CM | POA: Diagnosis not present

## 2021-11-17 DIAGNOSIS — Z66 Do not resuscitate: Secondary | ICD-10-CM | POA: Diagnosis not present

## 2021-11-17 DIAGNOSIS — Z515 Encounter for palliative care: Secondary | ICD-10-CM | POA: Diagnosis not present

## 2021-11-17 DIAGNOSIS — Z7189 Other specified counseling: Secondary | ICD-10-CM

## 2021-11-17 DIAGNOSIS — R6520 Severe sepsis without septic shock: Secondary | ICD-10-CM | POA: Diagnosis not present

## 2021-11-17 DIAGNOSIS — A419 Sepsis, unspecified organism: Secondary | ICD-10-CM | POA: Diagnosis not present

## 2021-11-17 LAB — CBC
HCT: 32 % — ABNORMAL LOW (ref 39.0–52.0)
Hemoglobin: 10.1 g/dL — ABNORMAL LOW (ref 13.0–17.0)
MCH: 28.9 pg (ref 26.0–34.0)
MCHC: 31.6 g/dL (ref 30.0–36.0)
MCV: 91.4 fL (ref 80.0–100.0)
Platelets: 206 10*3/uL (ref 150–400)
RBC: 3.5 MIL/uL — ABNORMAL LOW (ref 4.22–5.81)
RDW: 13.6 % (ref 11.5–15.5)
WBC: 15.7 10*3/uL — ABNORMAL HIGH (ref 4.0–10.5)
nRBC: 0 % (ref 0.0–0.2)

## 2021-11-17 LAB — BASIC METABOLIC PANEL
Anion gap: 7 (ref 5–15)
BUN: 15 mg/dL (ref 8–23)
CO2: 27 mmol/L (ref 22–32)
Calcium: 8 mg/dL — ABNORMAL LOW (ref 8.9–10.3)
Chloride: 104 mmol/L (ref 98–111)
Creatinine, Ser: 1.22 mg/dL (ref 0.61–1.24)
GFR, Estimated: >60 mL/min (ref >60)
Glucose, Bld: 138 mg/dL — ABNORMAL HIGH (ref 70–99)
Potassium: 3.8 mmol/L (ref 3.5–5.1)
Sodium: 138 mmol/L (ref 135–145)

## 2021-11-17 LAB — HEPATIC FUNCTION PANEL
ALT: 119 U/L — ABNORMAL HIGH (ref 0–44)
AST: 147 U/L — ABNORMAL HIGH (ref 15–41)
Albumin: 3 g/dL — ABNORMAL LOW (ref 3.5–5.0)
Alkaline Phosphatase: 104 U/L (ref 38–126)
Bilirubin, Direct: 2.1 mg/dL — ABNORMAL HIGH (ref 0.0–0.2)
Indirect Bilirubin: 1.1 mg/dL — ABNORMAL HIGH (ref 0.3–0.9)
Total Bilirubin: 3.2 mg/dL — ABNORMAL HIGH (ref 0.3–1.2)
Total Protein: 6.3 g/dL — ABNORMAL LOW (ref 6.5–8.1)

## 2021-11-17 LAB — URINE CULTURE

## 2021-11-17 LAB — STREP PNEUMONIAE URINARY ANTIGEN: Strep Pneumo Urinary Antigen: NEGATIVE

## 2021-11-17 LAB — PHOSPHORUS: Phosphorus: 3.7 mg/dL (ref 2.5–4.6)

## 2021-11-17 LAB — LIPASE, BLOOD: Lipase: 28 U/L (ref 11–51)

## 2021-11-17 LAB — LACTIC ACID, PLASMA: Lactic Acid, Venous: 1.2 mmol/L (ref 0.5–1.9)

## 2021-11-17 LAB — GLUCOSE, CAPILLARY: Glucose-Capillary: 132 mg/dL — ABNORMAL HIGH (ref 70–99)

## 2021-11-17 LAB — MAGNESIUM: Magnesium: 1.3 mg/dL — ABNORMAL LOW (ref 1.7–2.4)

## 2021-11-17 MED ORDER — POTASSIUM CHLORIDE 10 MEQ/100ML IV SOLN
10.0000 meq | INTRAVENOUS | Status: AC
  Administered 2021-11-17 (×4): 10 meq via INTRAVENOUS
  Filled 2021-11-17 (×4): qty 100

## 2021-11-17 MED ORDER — SODIUM CHLORIDE 0.9 % IV SOLN: INTRAVENOUS | Status: AC

## 2021-11-17 MED ORDER — MAGNESIUM SULFATE 4 GM/100ML IV SOLN
4.0000 g | Freq: Once | INTRAVENOUS | Status: AC
  Administered 2021-11-17: 4 g via INTRAVENOUS
  Filled 2021-11-17: qty 100

## 2021-11-17 NOTE — Plan of Care (Signed)

## 2021-11-17 NOTE — Progress Notes (Signed)
  PROGRESS NOTE    Jeff Key  YIR:[id number] DOB: 1945/10/24 DOA: 11/16/2021 PCP: [name], MD  [id number]  LOS: 1 day   Brief hospital course: No notes on file  Assessment & Plan: 76 y.o with significant PMH of CAD, ischemic cardiomyopathy, GERD, chronic ulcerative proctosigmoiditis, advanced dementia, DM, and anemia who presented to the ED with chief complaints of nausea and vomiting with associated tremors.  Patient is nonverbal at baseline due to advanced dementia.  Pt was admitted by ICU and transferred to hospitalist service on 8/31.   Severe sepsis 2/2 E coli bacteremia (Septic shock ruled out since pt did not need pressors) Fever, leukocytosis, Lactic: 5.4>9, source E coli bacteremia --started on Cefepime/ Vancomycin/ Metronidazole, switched to zosyn Plan: --cont zosyn  Acute gallstone pancreatitis --lipase 284, already back to normal the next day.  Ultrasound and CT abdomen with contrast did not reveal choledocholithiasis or dilated CBD.  No evidence of acute cholecystitis.  GI did not suspect ascending cholangitis --cont NS@75     Elevated LFTs --likely due to severe sepsis -CT abdomen pelvis and US abdomen no acute finding -hepatitis panel neg --monitor  Acute Metabolic Encephalopathy  Hx: Advanced Dementia nonverbal at baseline --per wife, pt has been somnolent, does not wake up to eat currently --monitor   Diabetes mellitus --BG checks    DVT prophylaxis: Lovenox SQ Code Status: DNR  Family Communication: wife updated at bedside today Level of care: Med-Surg Dispo:   The patient is from: home Anticipated d/c is to: home Anticipated d/c date is: undetermined   Subjective and Interval History:  Pt is sleeping almost continuously.  Non-verbal at baseline.     Objective: Vitals:   11/17/21 1300 11/17/21 1400 11/17/21 1459 11/17/21 1704  BP: 129/63 (!) 107/55 (!) 139/96 109/60  Pulse: 70 68 73 73  Resp: (!) 25 20 20 19   Temp:   97.7  F (36.5 C) (!) 100.4 F (38 C)  TempSrc:   Oral Oral  SpO2: 95% 94% 94% 90%  Weight:      Height:        Intake/Output Summary (Last 24 hours) at 11/17/2021 1854 Last data filed at 11/17/2021 1700 Gross per 24 hour  Intake 696.86 ml  Output 1050 ml  Net -353.14 ml   Filed Weights   11/16/21 0246 11/16/21 0630 11/17/21 0115  Weight: 118.5 kg 67.5 kg 118.8 kg    Examination:   Constitutional: NAD, sleeping CV: No cyanosis.   RESP: normal respiratory effort, on RA Extremities: right UE swollen SKIN: warm, dry   Data Reviewed: I have personally reviewed labs and imaging studies  Time spent: 50 minutes  11/19/21, MD [facility] If 7PM-7AM, please contact night-coverage 11/17/2021, 6:54 PM

## 2021-11-17 NOTE — TOC Initial Note (Signed)
Transition of Care [facility]) - Initial/Assessment Note    Patient Details  Name: Jeff Key MRN: [id number] Date of Birth: 09-03-1945  Transition of Care [facility]) CM/SW Contact:    Shelbie Hutching, RN Phone Number: 11/17/2021, 12:38 PM  Clinical Narrative:                 Patient admitted to the hospital with severe sepsis.  RNCM met with patient and his wife at the bedside, introduced self and explained role in DC planning.  Patient is from home with wife, she has been his caregiver, he is non verbal and non ambulatory at baseline and has been for the past 2 years.  They have a hospital bed but he sleeps in the bed with her.  Her son in laws and 3 daughters help with mobility.  The daughter's rotate nights that they come over and help out and get him to bed.  With 2 people on each side they can walk him to the bathroom before bed at night.  The only thing that the wife can think that she may need would be a lift to help with transfers.  RNCM has ordered hoyer lift through [employer].   Wife is not interested in placing the patient in a nursing facility and she is not interested in hospice.    TOC will cont to follow patient progress.  He may need EMS transport home.  Expected Discharge Plan: Home/Self Care Barriers to Discharge: Continued Medical Work up   Patient Goals and CMS Choice Patient states their goals for this hospitalization and ongoing recovery are:: Wife wants to get patient back home      Expected Discharge Plan and Services Expected Discharge Plan: Home/Self Care   Discharge Planning Services: CM Consult                     DME Arranged: Other see comment Harrel Lemon Lift) DME Agency: [employer] Date DME Agency Contacted: 11/17/21 Time DME Agency Contacted: [phone] Representative spoke with at DME Agency: [phone] [facility] Arranged: Patient Refused [place]          Prior Living Arrangements/Services   Lives with:: Spouse Patient language and need for interpreter reviewed::  Yes Do you feel safe going back to the place where you live?: Yes      Need for Family Participation in Patient Care: Yes (Comment) Care giver support system in place?: Yes (comment) Current home services: DME (hospital bed, walker, shower chair, bedside commode) Criminal Activity/Legal Involvement Pertinent to Current Situation/Hospitalization: No - Comment as needed  Activities of Daily Living      Permission Sought/Granted Permission sought to share information with : Case Manager, Family Supports Permission granted to share information with : Yes, Verbal Permission Granted  Share Information with NAME: Lautaro Koral     Permission granted to share info w Relationship: spouse  Permission granted to share info w Contact Information: [phone]  Emotional Assessment Appearance:: Appears stated age Attitude/Demeanor/Rapport:  (non verbal) Affect (typically observed): Unable to Assess   Alcohol / Substance Use: Not Applicable Psych Involvement: No (comment)  Admission diagnosis:  Lactic acidosis [E87.20] Elevated liver function tests [R79.89] Acute UTI [N39.0] Severe sepsis with septic shock (HCC) [A41.9, R65.21] Nausea vomiting and diarrhea [R11.2, R19.7] Severe sepsis with lactic acidosis (HCC) [A41.9, R65.20, E87.20] Patient Active Problem List   Diagnosis Date Noted   Severe sepsis with lactic acidosis ([place]) 11/16/2021   Elevated liver function tests    Enteritis  Ileus ([place])    Acute on chronic systolic CHF (congestive heart failure) ([place])    Goals of care, counseling/discussion    Palliative care by specialist    CAP (community acquired pneumonia) 45/36/4680   Acute metabolic encephalopathy 32/02/2481   hx of Ulcerative colitis ([place]) 10/14/2019   Acute respiratory failure with hypoxia ([place]) 10/14/2019   Severe sepsis ([place]) 10/14/2019   Hyperlipidemia 06/15/2017   Alzheimer's dementia without behavioral disturbance ([place]) 12/11/2016   Sepsis ([place]) 03/10/2016    Pseudoaneurysm of right femoral artery ([place]) 02/23/2016   NSVT (nonsustained ventricular tachycardia) ([place]) 02/15/2016   CAD (coronary artery disease)    Ischemic cardiomyopathy    STEMI involving oth coronary artery of inferior wall ([place]) 02/10/2016   Abnormal urinalysis 10/16/2014   PCP:  Tracie Harrier, MD Pharmacy:   [facility] [address] [place] [address] Phone: [phone] Fax: [phone]     Social Determinants of Health (SDOH) Interventions    Readmission Risk Interventions     No data to display

## 2021-11-17 NOTE — Progress Notes (Signed)
Tried calling report but RN on lunch and asked me to call back

## 2021-11-17 NOTE — Consult Note (Signed)
PHARMACY CONSULT NOTE - FOLLOW UP  Pharmacy Consult for Electrolyte Monitoring and Replacement   Recent Labs: Potassium (mmol/L)  Date Value  11/17/2021 3.8   Magnesium (mg/dL)  Date Value  21/62/4469 1.3 (L)   Calcium (mg/dL)  Date Value  50/72/2575 8.0 (L)   Albumin (g/dL)  Date Value  08/04/3356 3.0 (L)  05/26/2016 4.4   Phosphorus (mg/dL)  Date Value  25/18/9842 3.7   Sodium (mmol/L)  Date Value  11/17/2021 138     Assessment: 75yo M w/ h/o CAD, STEMI (s/p DES '17), iCMP (EF 45-50%), dementia, & UC presenting from home with N/V, AMS, fever of 102.2, and septic 2/2 UTI & pancreatitis (lipase 284, AST/ALT 314/115). CT chest no abnormality and did not reveal any gallstones or choledocholithiasis. Pharmacy consulted for mgmt of electrolytes in CCU.  Goal of Therapy:  K>4, Mg>2  Plan:  Scr 1.09>1.22 (unk BL); I/O Net +1L K 4.1>3.8 - slighly below goal with low mag. Replete with KCL IV q1h x4 Mg 1.3 - Replete with MgSO 4g IV x1 Other lytes WNL, will CTM daily with AM labs.   Martyn Malay ,PharmD Clinical Pharmacist 11/17/2021 9:05 AM

## 2021-11-17 NOTE — Consult Note (Signed)
Consultation Note Date: 11/17/2021   Patient Name: Jeff Key  DOB: 1945-11-04  MRN: [id number]  Age / Sex: 76 y.o., male  PCP: Tracie Harrier, MD Referring Physician: Enzo Bi, MD  Reason for Consultation: Establishing goals of care  HPI/Patient Profile: 76 y.o. male  with past medical history of CAD, ischemic cardiomyopathy, GERD, chronic ulcerative proctosigmoiditis, advanced dementia, DM, and anemia admitted on 11/16/2021 with nausea, vomiting, and diarrhea.  Patient diagnosed with likely gallstone pancreatitis and septic shock.  Patient also with elevated LFTs likely due to septic shock.  PMT consulted to discuss goals of care.  Clinical Assessment and Goals of Care: I have reviewed medical records including EPIC notes, labs and imaging, received report from RN, assessed the patient and then met with patient's wife [name] to discuss diagnosis prognosis, GOC, EOL wishes, disposition and options.  I introduced Palliative Medicine as specialized medical care for people living with serious illness. It focuses on providing relief from the symptoms and stress of a serious illness. The goal is to improve quality of life for both the patient and the family.  We discussed a brief life review of the patient.  Today's patient's birthday.  She tells me they have been married for 15 years.  She tells me they have a lot of family support-3 daughters and grandchildren that help them.  As far as functional and nutritional status she tells me patient is mostly dependent on others to care for him.  She tells me he is nonverbal.  She tells me he is mostly sedentary but she is able to transfer him with extensive assistance.  She tells me he has maintained a good appetite.  She tells me she gets them up in the morning and he spends time watching TV and typically takes several hour nap in the middle of the day and then is awake for dinnertime before going to  bed.   We discussed patient's current illness and what it means in the larger context of patient's on-going co-morbidities.  Natural disease trajectory and expectations at EOL were discussed.  We discussed that patient's dementia is quite advanced.  We discussed concern that significant illness will likely lead to a new baseline worse than when he was before.  She expresses understanding of this.  I attempted to elicit values and goals of care important to the patient.  She would like to continue to care for [name] at home.  We discussed the idea of rehab either through home health or rehab facility.  She does not feel that [name] would benefit much from rehab due to his lack of ability to participate and I shared I agreed.  The difference between aggressive medical intervention and comfort care was considered in light of the patient's goals of care.  She would like to continue current interventions for now and see how his body responds.  Discussed with spouse the importance of continued conversation with family and the medical providers regarding overall plan of care and treatment options, ensuring decisions are within the context of the patients values and GOCs.    Hospice services outpatient were explained and offered.  We discussed [name] is likely eligible for hospice services at home due to his advanced dementia.  Discussed the philosophy of hospice care and type of services that could be offered.  She tells me she will give this some thought we will follow-up in the coming days.  She would like to see how he responds to current  medical interventions prior to making decisions.  Questions and concerns were addressed. The family was encouraged to call with questions or concerns.  Primary Decision Maker NEXT OF KIN -wife [name]    SUMMARY OF RECOMMENDATIONS   -Discussion of patient's advanced dementia and concern about what his new baseline will be like  - introduction to  hospice and discussed patient is likely eligible for their services -wife considering hospice but she is unsure, she like to see how he does in the coming days -Wife likely not interested in rehab facility or home health at home -PMT will follow  Code Status/Advance Care Planning: DNR      Primary Diagnoses: Present on Admission:  Severe sepsis with lactic acidosis ([place])   I have reviewed the medical record, interviewed the patient and family, and examined the patient. The following aspects are pertinent.  Past Medical History:  Diagnosis Date   Anemia ~ 11/2015   CAD (coronary artery disease)    a. inf STEMI on 02/10/16. RCA treated with DES followed by staged PCI of LAD and Circumflex 02/14/16.   Dementia ([place]) dx'd 01/2016   GERD (gastroesophageal reflux disease)    History of stomach ulcers 1980s?   Ischemic cardiomyopathy    a. 01/2016: EF 40-45%.    Social History   Socioeconomic History   Marital status: Married    Spouse name: Not on file   Number of children: Not on file   Years of education: Not on file   Highest education level: Not on file  Occupational History   Not on file  Tobacco Use   Smoking status: Former    Packs/day: 2.00    Years: 30.00    Total pack years: 60.00    Types: Cigarettes    Quit date: 57    Years since quitting: 33.6   Smokeless tobacco: Never  Substance and Sexual Activity   Alcohol use: No    Alcohol/week: 0.0 standard drinks of alcohol   Drug use: No   Sexual activity: Never  Other Topics Concern   Not on file  Social History Narrative   Not on file   Social Determinants of Health   Financial Resource Strain: Not on file  Food Insecurity: Not on file  Transportation Needs: Not on file  Physical Activity: Not on file  Stress: Not on file  Social Connections: Not on file   Family History  Problem Relation Age of Onset   Prostate cancer Father    Bladder Cancer Neg Hx    Kidney cancer Neg Hx    Scheduled  Meds:  Chlorhexidine Gluconate Cloth  6 each Topical Daily   enoxaparin (LOVENOX) injection  40 mg Subcutaneous Q24H   mouth rinse  15 mL Mouth Rinse 4 times per day   Continuous Infusions:  sodium chloride Stopped (11/16/21 0539)   sodium chloride Stopped (11/17/21 0941)   piperacillin-tazobactam (ZOSYN)  IV 3.375 g (11/17/21 1145)   potassium chloride 10 mEq (11/17/21 1214)   PRN Meds:.docusate sodium, mouth rinse, mouth rinse, polyethylene glycol No Known Allergies Review of Systems  Unable to perform ROS: Patient nonverbal    Physical Exam Constitutional:      General: He is not in acute distress.    Appearance: He is ill-appearing.     Comments: Occasionally opens eyes to physical stimulation, nonverbal, unable to follow commands  Cardiovascular:     Rate and Rhythm: Normal rate.  Pulmonary:     Effort: Pulmonary effort is normal.  Skin:  General: Skin is warm and dry.  Neurological:     Mental Status: He is disoriented.     Vital Signs: BP 99/63 (BP Location: Left Arm)   Pulse 79   Temp 99.3 F (37.4 C) (Oral)   Resp 20   Ht 5' 10"  (1.778 m)   Wt 118.8 kg   SpO2 95%   BMI 37.58 kg/m  Pain Scale: CPOT       SpO2: SpO2: 95 % O2 Device:SpO2: 95 % O2 Flow Rate: .O2 Flow Rate (L/min): 2 L/min  IO: Intake/output summary:  Intake/Output Summary (Last 24 hours) at 11/17/2021 1221 Last data filed at 11/17/2021 0941 Gross per 24 hour  Intake 1109.34 ml  Output 1050 ml  Net 59.34 ml    LBM: Last BM Date :  (PTA) Baseline Weight: Weight: 118.5 kg Most recent weight: Weight: 118.8 kg     Palliative Assessment/Data: PPS 10% at this point due to no p.o. intake related to mental status     *Please note that this is a verbal dictation therefore any spelling or grammatical errors are due to the "[place] [employer]" system interpretation.   Juel Burrow, DNP, AGNP-C Palliative Medicine Team [phone] Pager: [phone]

## 2021-11-18 DIAGNOSIS — Z7189 Other specified counseling: Secondary | ICD-10-CM | POA: Diagnosis not present

## 2021-11-18 DIAGNOSIS — R6520 Severe sepsis without septic shock: Secondary | ICD-10-CM | POA: Diagnosis not present

## 2021-11-18 DIAGNOSIS — A419 Sepsis, unspecified organism: Secondary | ICD-10-CM | POA: Diagnosis not present

## 2021-11-18 DIAGNOSIS — Z66 Do not resuscitate: Secondary | ICD-10-CM | POA: Diagnosis not present

## 2021-11-18 DIAGNOSIS — E872 Acidosis, unspecified: Secondary | ICD-10-CM | POA: Diagnosis not present

## 2021-11-18 DIAGNOSIS — Z515 Encounter for palliative care: Secondary | ICD-10-CM | POA: Diagnosis not present

## 2021-11-18 LAB — CULTURE, BLOOD (ROUTINE X 2)
Special Requests: ADEQUATE
Special Requests: ADEQUATE

## 2021-11-18 LAB — BASIC METABOLIC PANEL
Anion gap: 4 — ABNORMAL LOW (ref 5–15)
BUN: 14 mg/dL (ref 8–23)
CO2: 25 mmol/L (ref 22–32)
Calcium: 7.9 mg/dL — ABNORMAL LOW (ref 8.9–10.3)
Chloride: 106 mmol/L (ref 98–111)
Creatinine, Ser: 1.05 mg/dL (ref 0.61–1.24)
GFR, Estimated: >60 mL/min (ref >60)
Glucose, Bld: 118 mg/dL — ABNORMAL HIGH (ref 70–99)
Potassium: 4 mmol/L (ref 3.5–5.1)
Sodium: 135 mmol/L (ref 135–145)

## 2021-11-18 LAB — HEPATIC FUNCTION PANEL
ALT: 97 U/L — ABNORMAL HIGH (ref 0–44)
AST: 108 U/L — ABNORMAL HIGH (ref 15–41)
Albumin: 2.6 g/dL — ABNORMAL LOW (ref 3.5–5.0)
Alkaline Phosphatase: 130 U/L — ABNORMAL HIGH (ref 38–126)
Bilirubin, Direct: 2 mg/dL — ABNORMAL HIGH (ref 0.0–0.2)
Indirect Bilirubin: 1.3 mg/dL — ABNORMAL HIGH (ref 0.3–0.9)
Total Bilirubin: 3.3 mg/dL — ABNORMAL HIGH (ref 0.3–1.2)
Total Protein: 6.2 g/dL — ABNORMAL LOW (ref 6.5–8.1)

## 2021-11-18 LAB — CBC
HCT: 32.1 % — ABNORMAL LOW (ref 39.0–52.0)
Hemoglobin: 10.3 g/dL — ABNORMAL LOW (ref 13.0–17.0)
MCH: 28.9 pg (ref 26.0–34.0)
MCHC: 32.1 g/dL (ref 30.0–36.0)
MCV: 90.2 fL (ref 80.0–100.0)
Platelets: 180 10*3/uL (ref 150–400)
RBC: 3.56 MIL/uL — ABNORMAL LOW (ref 4.22–5.81)
RDW: 13.2 % (ref 11.5–15.5)
WBC: 9.1 10*3/uL (ref 4.0–10.5)
nRBC: 0 % (ref 0.0–0.2)

## 2021-11-18 LAB — LEGIONELLA PNEUMOPHILA SEROGP 1 UR AG: L. pneumophila Serogp 1 Ur Ag: NEGATIVE

## 2021-11-18 LAB — PHOSPHORUS: Phosphorus: 2.7 mg/dL (ref 2.5–4.6)

## 2021-11-18 LAB — MAGNESIUM: Magnesium: 2.1 mg/dL (ref 1.7–2.4)

## 2021-11-18 MED ORDER — ACETAMINOPHEN 325 MG PO TABS
650.0000 mg | ORAL_TABLET | Freq: Four times a day (QID) | ORAL | Status: DC | PRN
  Administered 2021-11-18 – 2021-11-19 (×2): 650 mg via ORAL
  Filled 2021-11-18 (×2): qty 2

## 2021-11-18 MED ORDER — SODIUM CHLORIDE 0.9 % IV SOLN: INTRAVENOUS | Status: DC

## 2021-11-18 NOTE — Progress Notes (Signed)
Daily Progress Note   Patient Name: Jeff Key       Date: 11/18/2021 DOB: 10-Mar-1946  Age: 76 y.o. MRN#: [id number] Attending Physician: Enzo Bi, MD Primary Care Physician: Tracie Harrier, MD Admit Date: 11/16/2021  Reason for Consultation/Follow-up: Establishing goals of care  Subjective: Patient mostly sleeping but does attempt to open eyes and look around more than yesterday Nonverbal at baseline  Length of Stay: 2  Current Medications: Scheduled Meds:   enoxaparin (LOVENOX) injection  40 mg Subcutaneous Q24H   mouth rinse  15 mL Mouth Rinse 4 times per day    Continuous Infusions:  sodium chloride Stopped (11/16/21 0539)   piperacillin-tazobactam (ZOSYN)  IV 3.375 g (11/18/21 1158)    PRN Meds: docusate sodium, mouth rinse, mouth rinse, polyethylene glycol  Physical Exam Constitutional:      General: He is not in acute distress.    Appearance: He is ill-appearing.     Comments: Lethargic  Pulmonary:     Effort: Pulmonary effort is normal.  Skin:    General: Skin is warm and dry.  Neurological:     Mental Status: He is disoriented.             Vital Signs: BP (!) 142/107 (BP Location: Left Arm)   Pulse 72   Temp 98.6 F (37 C) (Oral)   Resp 20   Ht 5' 10"  (1.778 m)   Wt 118.8 kg   SpO2 91%   BMI 37.58 kg/m  SpO2: SpO2: 91 % O2 Device: O2 Device: Room Air O2 Flow Rate: O2 Flow Rate (L/min): 2 L/min  Intake/output summary:  Intake/Output Summary (Last 24 hours) at 11/18/2021 1247 Last data filed at 11/18/2021 1000 Gross per 24 hour  Intake 560.36 ml  Output 1150 ml  Net -589.64 ml   LBM: Last BM Date :  (PTA) Baseline Weight: Weight: 118.5 kg Most recent weight: Weight: 118.8 kg       Palliative Assessment/Data: PPS 10%      Patient Active Problem  List   Diagnosis Date Noted   Severe sepsis with lactic acidosis (HCC) 11/16/2021   Elevated liver function tests    Enteritis    Ileus (HCC)    Acute on chronic systolic CHF (congestive heart failure) ([place])    Goals of care, counseling/discussion    Palliative care by specialist    CAP (community acquired pneumonia) 63/14/9702   Acute metabolic encephalopathy 63/78/5885   hx of Ulcerative colitis ([place]) 10/14/2019   Acute respiratory failure with hypoxia ([place]) 10/14/2019   Severe sepsis ([place]) 10/14/2019   Hyperlipidemia 06/15/2017   Alzheimer's dementia without behavioral disturbance ([place]) 12/11/2016   Sepsis ([place]) 03/10/2016   Pseudoaneurysm of right femoral artery ([place]) 02/23/2016   NSVT (nonsustained ventricular tachycardia) ([place]) 02/15/2016   CAD (coronary artery disease)    Ischemic cardiomyopathy    STEMI involving oth coronary artery of inferior wall (HCC) 02/10/2016   Abnormal urinalysis 10/16/2014    Palliative Care Assessment & Plan   HPI: 76 y.o. male  with past medical history of CAD, ischemic cardiomyopathy, GERD, chronic ulcerative proctosigmoiditis, advanced dementia, DM, and anemia admitted on 11/16/2021 with nausea, vomiting, and diarrhea.  Patient diagnosed with  likely gallstone pancreatitis and septic shock.  Patient also with elevated LFTs likely due to septic shock.  PMT consulted to discuss goals of care.  Assessment: Met with wife and granddaughter at bedside.  We discussed patient's state that he continues to be quite lethargic and due to mental status unable to maintain any p.o. intake.  We discussed concern that this is ongoing however he does seem to be opening his eyes more and wife is hopeful that his mental status will continue to improve in the coming days to the point that he could attempt some p.o. intake. We discussed continuing current course and allowing time for outcomes.  PMT will follow-up on Monday.  Recommendations/Plan: AMS and no p.o.  intake continues -wife aware of concerns about status moving forward Wife would like to continue current measures and see how patient responds in hopes that mental status continues to improve and he can start some p.o. intake PMT to follow-up Monday  Code Status: DNR  Care plan was discussed with wife  Thank you for allowing the Palliative Medicine Team to assist in the care of this patient.  *Please note that this is a verbal dictation therefore any spelling or grammatical errors are due to the "[place] [employer]" system interpretation.  Juel Burrow, DNP, [facility] Palliative Medicine Team Team Phone # [phone]  Pager [phone]

## 2021-11-18 NOTE — TOC Progression Note (Signed)
Transition of Care [facility]) - Progression Note    Patient Details  Name: Jeff Key MRN: [id number] Date of Birth: 1945/05/05  [facility]) CM/SW Contact  Margarito Liner, LCSW Phone Number: 11/18/2021, 12:27 PM  Clinical Narrative: Wife confirmed hoyer lift was delivered yesterday. Patient will likely need EMS transport home. Address on facesheet is correct.    Expected Discharge Plan: Home/Self Care Barriers to Discharge: Continued Medical Work up  Expected Discharge Plan and Services Expected Discharge Plan: Home/Self Care   Discharge Planning Services: CM Consult                     DME Arranged: Other see comment [facility]iel Sites Lift) DME Agency: [employer] Date DME Agency Contacted: 11/17/21 Time DME Agency Contacted: 1234 Representative spoke with at DME Agency: [phone] [facility] Arranged: Patient Refused HH           Social Determinants of Health (SDOH) Interventions    Readmission Risk Interventions     No data to display

## 2021-11-18 NOTE — Progress Notes (Signed)
  PROGRESS NOTE    Jeff Key  VEL:[id number] DOB: 16-Aug-1945 DOA: 11/16/2021 PCP: [name], MD  209A/209A-AA  LOS: 2 days   Brief hospital course: No notes on file  Assessment & Plan: 76 y.o with significant PMH of CAD, ischemic cardiomyopathy, GERD, chronic ulcerative proctosigmoiditis, advanced dementia, DM, and anemia who presented to the ED with chief complaints of nausea and vomiting with associated tremors.  Patient is nonverbal at baseline due to advanced dementia.  Pt was admitted by ICU and transferred to hospitalist service on 8/31.   Severe sepsis 2/2 E coli bacteremia (Septic shock ruled out since pt did not need pressors) Fever, leukocytosis, Lactic: 5.4>9, source E coli bacteremia --started on Cefepime/ Vancomycin/ Metronidazole, switched to zosyn Plan: --cont zosyn  Acute gallstone pancreatitis --lipase 284, already back to normal the next day.  Ultrasound and CT abdomen with contrast did not reveal choledocholithiasis or dilated CBD.  No evidence of acute cholecystitis.  GI did not suspect ascending cholangitis --cont NS@75     Elevated LFTs --likely due to severe sepsis -CT abdomen pelvis and US abdomen no acute finding -hepatitis panel neg --monitor  Acute Metabolic Encephalopathy  Hx: Advanced Dementia nonverbal at baseline --per wife, pt has been somnolent, does not wake up to eat currently --monitor   Diabetes mellitus --BG checks to monitor for hypoglycemia, no need for SSI  Poor oral intake --not alert enough to eat/drink yet.   --consider tube feed if no oral intake for >1 week.    DVT prophylaxis: Lovenox SQ Code Status: DNR  Family Communication: wife updated at bedside today Level of care: Med-Surg Dispo:   The patient is from: home Anticipated d/c is to: home Anticipated d/c date is: undetermined   Subjective and Interval History:  Pt woke up more today, but still not considered alert enough to  eat/drink.   Objective: Vitals:   11/18/21 0512 11/18/21 0515 11/18/21 0759 11/18/21 0811  BP: 135/62   (!) 142/107  Pulse: 74 69  72  Resp: 20   20  Temp: (!) 100.7 F (38.2 C) 100 F (37.8 C) 98.8 F (37.1 C) 98.6 F (37 C)  TempSrc:    Oral  SpO2: 91% 96%  91%  Weight:      Height:        Intake/Output Summary (Last 24 hours) at 11/18/2021 1547 Last data filed at 11/18/2021 1000 Gross per 24 hour  Intake 1 ml  Output 550 ml  Net -549 ml   Filed Weights   11/16/21 0246 11/16/21 0630 11/17/21 0115  Weight: 118.5 kg 67.5 kg 118.8 kg    Examination:   Constitutional: NAD, eyes open, non-verbal HEENT: conjunctivae and lids normal, EOMI CV: No cyanosis.   RESP: normal respiratory effort, on RA SKIN: warm, dry   Data Reviewed: I have personally reviewed labs and imaging studies  Time spent: 35 minutes  Darlin Priestly, MD [facility] If 7PM-7AM, please contact night-coverage 11/18/2021, 3:47 PM

## 2021-11-18 NOTE — Care Management Important Message (Signed)
Important Message  Patient Details  Name: Jeff Key MRN: [id number] Date of Birth: 06-Jun-1945   Medicare Important Message Given:  Yes     [name] 11/18/2021, 5:19 PM

## 2021-11-18 NOTE — Consult Note (Signed)
PHARMACY CONSULT NOTE - FOLLOW UP  Pharmacy Consult for Electrolyte Monitoring and Replacement   Recent Labs: Potassium (mmol/L)  Date Value  11/18/2021 4.0   Magnesium (mg/dL)  Date Value  80/16/5537 2.1   Calcium (mg/dL)  Date Value  48/27/0786 7.9 (L)   Albumin (g/dL)  Date Value  75/44/9201 3.0 (L)  05/26/2016 4.4   Phosphorus (mg/dL)  Date Value  00/71/2197 2.7   Sodium (mmol/L)  Date Value  11/18/2021 135     Assessment: 76yo M w/ h/o CAD, STEMI (s/p DES '17), iCMP (EF 45-50%), dementia, & UC presenting from home with N/V, AMS, fever of 102.2, and septic 2/2 UTI & pancreatitis (lipase 284, AST/ALT 314/115). CT chest no abnormality and did not reveal any gallstones or choledocholithiasis. Pharmacy consulted for mgmt of electrolytes in CCU.  Goal of Therapy:  K>4, Mg>2  Plan:  Electolytes WNL Pharmacy to sign off and monitor electrolytes peripherally but please re-engage if additional electrolyte concerns arise.    Selinda Eon ,PharmD Clinical Pharmacist 11/18/2021 10:05 AM

## 2021-11-18 NOTE — Plan of Care (Signed)

## 2021-11-19 DIAGNOSIS — E872 Acidosis, unspecified: Secondary | ICD-10-CM | POA: Diagnosis not present

## 2021-11-19 DIAGNOSIS — R6520 Severe sepsis without septic shock: Secondary | ICD-10-CM | POA: Diagnosis not present

## 2021-11-19 DIAGNOSIS — A419 Sepsis, unspecified organism: Secondary | ICD-10-CM | POA: Diagnosis not present

## 2021-11-19 LAB — CBC
HCT: 32.6 % — ABNORMAL LOW (ref 39.0–52.0)
Hemoglobin: 10.5 g/dL — ABNORMAL LOW (ref 13.0–17.0)
MCH: 28.2 pg (ref 26.0–34.0)
MCHC: 32.2 g/dL (ref 30.0–36.0)
MCV: 87.6 fL (ref 80.0–100.0)
Platelets: 188 10*3/uL (ref 150–400)
RBC: 3.72 MIL/uL — ABNORMAL LOW (ref 4.22–5.81)
RDW: 13 % (ref 11.5–15.5)
WBC: 7.7 10*3/uL (ref 4.0–10.5)
nRBC: 0 % (ref 0.0–0.2)

## 2021-11-19 LAB — BASIC METABOLIC PANEL
Anion gap: 6 (ref 5–15)
BUN: 12 mg/dL (ref 8–23)
CO2: 23 mmol/L (ref 22–32)
Calcium: 7.6 mg/dL — ABNORMAL LOW (ref 8.9–10.3)
Chloride: 105 mmol/L (ref 98–111)
Creatinine, Ser: 0.95 mg/dL (ref 0.61–1.24)
GFR, Estimated: >60 mL/min (ref >60)
Glucose, Bld: 111 mg/dL — ABNORMAL HIGH (ref 70–99)
Potassium: 3.8 mmol/L (ref 3.5–5.1)
Sodium: 134 mmol/L — ABNORMAL LOW (ref 135–145)

## 2021-11-19 LAB — GLUCOSE, CAPILLARY: Glucose-Capillary: 136 mg/dL — ABNORMAL HIGH (ref 70–99)

## 2021-11-19 LAB — MAGNESIUM: Magnesium: 1.7 mg/dL (ref 1.7–2.4)

## 2021-11-19 MED ORDER — POLYETHYLENE GLYCOL 3350 17 G PO PACK
34.0000 g | PACK | Freq: Two times a day (BID) | ORAL | Status: DC
  Administered 2021-11-19 – 2021-11-20 (×3): 34 g via ORAL
  Filled 2021-11-19 (×3): qty 2

## 2021-11-19 NOTE — Plan of Care (Signed)
  Problem: Clinical Measurements: Goal: Diagnostic test results will improve Outcome: Progressing Goal: Respiratory complications will improve Outcome: Progressing   Problem: Nutrition: Goal: Adequate nutrition will be maintained Outcome: Progressing   Problem: Pain Managment: Goal: General experience of comfort will improve Outcome: Progressing   Problem: Safety: Goal: Ability to remain free from injury will improve Outcome: Progressing   Problem: Skin Integrity: Goal: Risk for impaired skin integrity will decrease Outcome: Progressing

## 2021-11-19 NOTE — Progress Notes (Signed)
  PROGRESS NOTE    Jeff Key  ZDG:[id number] DOB: [birth year] DOA: 11/16/2021 PCP: [name], MD  [id number]  LOS: 3 days   Brief hospital course: No notes on file  Assessment & Plan: 76 y.o with significant PMH of CAD, ischemic cardiomyopathy, GERD, chronic ulcerative proctosigmoiditis, advanced dementia, DM, and anemia who presented to the ED with chief complaints of nausea and vomiting with associated tremors.  Patient is nonverbal at baseline due to advanced dementia.  Pt was admitted by ICU and transferred to hospitalist service on 8/31.   Severe sepsis 2/2 E coli bacteremia (Septic shock ruled out since pt did not need pressors) Fever, leukocytosis, Lactic: 5.4>9, source E coli bacteremia --started on Cefepime/ Vancomycin/ Metronidazole, switched to zosyn Plan: --cont zosyn  Acute gallstone pancreatitis --lipase 284, already back to normal the next day.  Ultrasound and CT abdomen with contrast did not reveal choledocholithiasis or dilated CBD.  No evidence of acute cholecystitis.  GI did not suspect ascending cholangitis --d/c MIVF today    Elevated LFTs, improved --likely due to severe sepsis -CT abdomen pelvis and US abdomen no acute finding -hepatitis panel neg  Acute Metabolic Encephalopathy  Hx: Advanced Dementia nonverbal at baseline --per wife, pt had been somnolent and waking up to eat. --starting to stay awake more and eating/drinking now   Diabetes mellitus --no need for SSI   DVT prophylaxis: Lovenox SQ Code Status: DNR  Family Communication: wife updated at bedside today Level of care: Med-Surg Dispo:   The patient is from: home Anticipated d/c is to: home Anticipated d/c date is: 1-2 days   Subjective and Interval History:  Pt is staying awake more and started to eat/drink.   Objective: Vitals:   11/18/21 1911 11/19/21 0515 11/19/21 0722 11/19/21 1054  BP: (!) 154/83 (!) 143/75 (!) 150/77 127/75  Pulse: 67 (!) 58 62 (!) 58   Resp: 20 20 20    Temp: 98.5 F (36.9 C) 97.9 F (36.6 C) 97.7 F (36.5 C) 98.6 F (37 C)  TempSrc: Oral Axillary    SpO2: 94% 93% 93% 94%  Weight:  112.5 kg    Height:        Intake/Output Summary (Last 24 hours) at 11/19/2021 1545 Last data filed at 11/19/2021 1359 Gross per 24 hour  Intake 1702.69 ml  Output 1200 ml  Net 502.69 ml   Filed Weights   11/16/21 0630 11/17/21 0115 11/19/21 0515  Weight: 67.5 kg 118.8 kg 112.5 kg    Examination:   Constitutional: NAD, nonverbal CV: No cyanosis.   RESP: normal respiratory effort, on RA SKIN: warm, dry   Data Reviewed: I have personally reviewed labs and imaging studies  Time spent: 35 minutes  01/19/22, MD [facility] If 7PM-7AM, please contact night-coverage 11/19/2021, 3:45 PM

## 2021-11-20 DIAGNOSIS — R6520 Severe sepsis without septic shock: Secondary | ICD-10-CM | POA: Diagnosis not present

## 2021-11-20 DIAGNOSIS — E872 Acidosis, unspecified: Secondary | ICD-10-CM | POA: Diagnosis not present

## 2021-11-20 DIAGNOSIS — A419 Sepsis, unspecified organism: Secondary | ICD-10-CM | POA: Diagnosis not present

## 2021-11-20 LAB — BASIC METABOLIC PANEL
Anion gap: 8 (ref 5–15)
BUN: 10 mg/dL (ref 8–23)
CO2: 22 mmol/L (ref 22–32)
Calcium: 8 mg/dL — ABNORMAL LOW (ref 8.9–10.3)
Chloride: 101 mmol/L (ref 98–111)
Creatinine, Ser: 0.86 mg/dL (ref 0.61–1.24)
GFR, Estimated: >60 mL/min (ref >60)
Glucose, Bld: 129 mg/dL — ABNORMAL HIGH (ref 70–99)
Potassium: 3.4 mmol/L — ABNORMAL LOW (ref 3.5–5.1)
Sodium: 131 mmol/L — ABNORMAL LOW (ref 135–145)

## 2021-11-20 LAB — CBC
HCT: 34 % — ABNORMAL LOW (ref 39.0–52.0)
Hemoglobin: 11 g/dL — ABNORMAL LOW (ref 13.0–17.0)
MCH: 28.1 pg (ref 26.0–34.0)
MCHC: 32.4 g/dL (ref 30.0–36.0)
MCV: 87 fL (ref 80.0–100.0)
Platelets: 224 10*3/uL (ref 150–400)
RBC: 3.91 MIL/uL — ABNORMAL LOW (ref 4.22–5.81)
RDW: 13.4 % (ref 11.5–15.5)
WBC: 8.9 10*3/uL (ref 4.0–10.5)
nRBC: 0 % (ref 0.0–0.2)

## 2021-11-20 LAB — MAGNESIUM: Magnesium: 2 mg/dL (ref 1.7–2.4)

## 2021-11-20 MED ORDER — CIPROFLOXACIN HCL 500 MG PO TABS
500.0000 mg | ORAL_TABLET | Freq: Two times a day (BID) | ORAL | 0 refills | Status: AC
Start: 2021-11-20 — End: 2021-11-25

## 2021-11-20 MED ORDER — CIPROFLOXACIN HCL 500 MG PO TABS
500.0000 mg | ORAL_TABLET | Freq: Two times a day (BID) | ORAL | Status: DC
  Administered 2021-11-20: 500 mg via ORAL
  Filled 2021-11-20: qty 1

## 2021-11-20 MED ORDER — POTASSIUM CHLORIDE 20 MEQ PO PACK
40.0000 meq | PACK | Freq: Once | ORAL | Status: AC
  Administered 2021-11-20: 40 meq via ORAL
  Filled 2021-11-20: qty 2

## 2021-11-20 MED ORDER — METOPROLOL TARTRATE 25 MG PO TABS
ORAL_TABLET | ORAL | 3 refills | Status: DC
Start: 2021-11-20 — End: 2023-07-12

## 2021-11-20 NOTE — Progress Notes (Signed)
Lillia Dallas to be D/C'd Home per MD order.  Discussed prescriptions and follow up appointments with the patient's wife. Prescriptions given to patient, medication list explained in detail. Patient's wife verbalized understanding.  Allergies as of 11/20/2021   No Known Allergies      Medication List     TAKE these medications    aspirin EC 81 MG tablet Take 81 mg by mouth daily.   atorvastatin 20 MG tablet Commonly known as: LIPITOR Take 1 tablet (20 mg total) by mouth daily. Schedule an appointment for further refills   balsalazide 750 MG capsule Commonly known as: COLAZAL Take 2,250 mg by mouth 3 (three) times daily.   ciprofloxacin 500 MG tablet Commonly known as: CIPRO Take 1 tablet (500 mg total) by mouth 2 (two) times daily for 5 days.   clopidogrel 75 MG tablet Commonly known as: PLAVIX Take 1 tablet (75 mg total) by mouth daily.   cyanocobalamin 1000 MCG tablet Commonly known as: VITAMIN B12 Take 1,000 mcg by mouth daily.   escitalopram 5 MG tablet Commonly known as: LEXAPRO Take 10 mg by mouth daily.   ferrous sulfate 325 (65 FE) MG tablet Take 325 mg by mouth daily with breakfast.   galantamine 12 MG tablet Commonly known as: RAZADYNE Take 12 mg by mouth 2 (two) times daily with a meal.   lisinopril 10 MG tablet Commonly known as: ZESTRIL Take 1 tablet (10 mg total) by mouth daily.   memantine 10 MG tablet Commonly known as: NAMENDA Take 10 mg by mouth 2 (two) times daily.   metFORMIN 500 MG tablet Commonly known as: GLUCOPHAGE Take 500 mg by mouth 2 (two) times daily with a meal.   metoprolol tartrate 25 MG tablet Commonly known as: LOPRESSOR Hold until followup with outpatient provider since heart rate was low without it. What changed:  how much to take how to take this when to take this additional instructions   nitroGLYCERIN 0.4 MG SL tablet Commonly known as: Nitrostat Place 1 tablet (0.4 mg total) under the tongue every 5 (five)  minutes as needed for chest pain.   omeprazole 20 MG capsule Commonly known as: PRILOSEC Take 20 mg by mouth daily.               Durable Medical Equipment  (From admission, onward)           Start     Ordered   11/17/21 1235  For home use only DME Other see comment  Once       Comments: Hoyer Lift  Question:  Length of Need  Answer:  Lifetime   11/17/21 1235            Vitals:   11/20/21 0331 11/20/21 0753  BP: 139/87 (!) 155/85  Pulse: 66 66  Resp: 20 18  Temp: 98.7 F (37.1 C) 97.9 F (36.6 C)  SpO2: 94% 95%    Skin clean, dry and intact without evidence of skin break down, no evidence of skin tears noted. IV catheter discontinued intact. Site without signs and symptoms of complications. Dressing and pressure applied. Pt denies pain at this time. No complaints noted.  An After Visit Summary was printed and given to the patient wife.  Patient D/C home via EMS.   Madie Reno, RN

## 2021-11-20 NOTE — Discharge Summary (Signed)
Physician Discharge Summary   Jeff Key  male DOB: Jan 05, 1946  RUE:[id number]  PCP: Barbette ReichmannHande, Vishwanath, MD  Admit date: 11/16/2021 Discharge date: 11/20/2021  Admitted From: home Disposition:  home Home Health: Yes CODE STATUS: DNR  Discharge Instructions     Discharge instructions   Complete by: As directed    For your E coli infection in the blood, you have finished 5 days of IV antibiotics.  Please finish 5 more days of oral antibiotic Cipro starting tonight at home.  Your heart rate was mostly in 50's-60's without Lopressor, so please hold Lopressor until followup with your PCP.   Dr. Darlin Priestlyina Lonie Rummell [facility]- -      Hospital Course:  For full details, please see H&P, progress notes, consult notes and ancillary notes.  Briefly,  Jeff Key is a 76 y.o with significant PMH of CAD, ischemic cardiomyopathy, chronic ulcerative proctosigmoiditis, advanced dementia, DM, who presented to the ED with chief complaints of nausea and vomiting with associated tremors.  Patient is nonverbal at baseline due to advanced dementia.   Pt was admitted by ICU and transferred to hospitalist service on 8/31.   Severe sepsis 2/2 E coli bacteremia (Septic shock ruled out since pt did not need pressors) Fever, leukocytosis, Lactic: 5.4>9, source E coli bacteremia (E coli pan-sensitive). --started on Cefepime/ Vancomycin/ Metronidazole, switched to zosyn.  Pt received 5 days of IV abx and was discharged on 5 more days of Cipro.   Acute gallstone pancreatitis --lipase 284, already back to normal the next day.  Ultrasound and CT abdomen with contrast did not reveal choledocholithiasis or dilated CBD.  No evidence of acute cholecystitis.  GI did not suspect ascending cholangitis.    Elevated LFTs, improved --likely due to severe sepsis -CT abdomen pelvis and US abdomen no acute finding -hepatitis panel neg   Acute Metabolic Encephalopathy  Hx: Advanced Dementia nonverbal at  baseline --Encephalopathy due to infection.  per wife, pt had been somnolent and not waking up to eat.  Prior to discharge, pt was more alert and started to wake up to eat all his meals.   Diabetes mellitus --no need for SSI.  Resume home metformin after discharge.  HTN --Home Lisinopril and Lopressor held during hospitalization.  Resume Lisinopril after discharge.  Lopressor held due to HR in 50's-60's.   Discharge Diagnoses:  Principal Problem:   Severe sepsis with lactic acidosis (HCC) Active Problems:   Elevated liver function tests   30 Day Unplanned Readmission Risk Score    Flowsheet Row ED to Hosp-Admission (Current) from [facility]  30 Day Unplanned Readmission Risk Score (%) 14.32 Filed at 11/20/2021 1200       This score is the patient's risk of an unplanned readmission within 30 days of being discharged (0 -100%). The score is based on dignosis, age, lab data, medications, orders, and past utilization.   Low:  0-14.9   Medium: 15-21.9   High: 22-29.9   Extreme: 30 and above         Discharge Instructions:  Allergies as of 11/20/2021   No Known Allergies      Medication List     TAKE these medications    aspirin EC 81 MG tablet Take 81 mg by mouth daily.   atorvastatin 20 MG tablet Commonly known as: LIPITOR Take 1 tablet (20 mg total) by mouth daily. Schedule an appointment for further refills   balsalazide 750 MG capsule Commonly known as:  COLAZAL Take 2,250 mg by mouth 3 (three) times daily.   ciprofloxacin 500 MG tablet Commonly known as: CIPRO Take 1 tablet (500 mg total) by mouth 2 (two) times daily for 5 days.   clopidogrel 75 MG tablet Commonly known as: PLAVIX Take 1 tablet (75 mg total) by mouth daily.   cyanocobalamin 1000 MCG tablet Commonly known as: VITAMIN B12 Take 1,000 mcg by mouth daily.   escitalopram 5 MG tablet Commonly known as: LEXAPRO Take 10 mg by mouth daily.    ferrous sulfate 325 (65 FE) MG tablet Take 325 mg by mouth daily with breakfast.   galantamine 12 MG tablet Commonly known as: RAZADYNE Take 12 mg by mouth 2 (two) times daily with a meal.   lisinopril 10 MG tablet Commonly known as: ZESTRIL Take 1 tablet (10 mg total) by mouth daily.   memantine 10 MG tablet Commonly known as: NAMENDA Take 10 mg by mouth 2 (two) times daily.   metFORMIN 500 MG tablet Commonly known as: GLUCOPHAGE Take 500 mg by mouth 2 (two) times daily with a meal.   metoprolol tartrate 25 MG tablet Commonly known as: LOPRESSOR Hold until followup with outpatient provider since heart rate was low without it. What changed:  how much to take how to take this when to take this additional instructions   nitroGLYCERIN 0.4 MG SL tablet Commonly known as: Nitrostat Place 1 tablet (0.4 mg total) under the tongue every 5 (five) minutes as needed for chest pain.   omeprazole 20 MG capsule Commonly known as: PRILOSEC Take 20 mg by mouth daily.               Durable Medical Equipment  (From admission, onward)           Start     Ordered   11/17/21 1235  For home use only DME Other see comment  Once       Comments: Michiel Sites Lift  Question:  Length of Need  Answer:  Lifetime   11/17/21 1235             Follow-up Information     Hande, Roderic Palau, MD Follow up in 1 week(s).   Specialty: Internal Medicine Contact information: [address] [place] [address] [phone]                 No Known Allergies   The results of significant diagnostics from this hospitalization (including imaging, microbiology, ancillary and laboratory) are listed below for reference.   Consultations:   Procedures/Studies: US ABDOMEN LIMITED RUQ (LIVER/GB)  Result Date: 11/16/2021 CLINICAL DATA:  Elevated liver function tests EXAM: ULTRASOUND ABDOMEN LIMITED RIGHT UPPER QUADRANT COMPARISON:  Abdominal CT from earlier the same day  FINDINGS: Gallbladder: Small gallstones which are in retrospect confirmed on prior CT. No focal tenderness detected. No wall thickening Common bile duct: Diameter: 3 mm Liver: There is diffusely echogenic liver with diminished acoustic penetration. Portal vein is patent on color Doppler imaging with normal direction of blood flow towards the liver. IMPRESSION: 1. Hepatic steatosis. 2. Tiny gallbladder calculi. 3. No acute finding Electronically Signed   By: Tiburcio Pea M.D.   On: 11/16/2021 07:52   CT HEAD WO CONTRAST ( )  Result Date: 11/16/2021 CLINICAL DATA:  Mental status change with unknown cause EXAM: CT HEAD WITHOUT CONTRAST TECHNIQUE: Contiguous axial images were obtained from the base of the skull through the vertex without intravenous contrast. RADIATION DOSE REDUCTION: This exam was performed according to the departmental  dose-optimization program which includes automated exposure control, adjustment of the mA and/or kV according to patient size and/or use of iterative reconstruction technique. COMPARISON:  10/13/2019 FINDINGS: Brain: No evidence of acute infarction, hemorrhage, hydrocephalus, extra-axial collection or mass lesion/mass effect. Motion artifact. Advanced brain atrophy. Chronic small vessel ischemia to a moderate degree. Vascular: Atheromatous calcification Skull: No acute or aggressive finding. Sinuses/Orbits: Interval opacification of the left frontal, maxillary, and ethmoid sinuses, obstruction at the middle meatus. IMPRESSION: 1. Motion degraded head CT without acute finding. 2. Advanced brain atrophy.  Chronic small vessel ischemia. 3. Interval left-sided sinusitis with middle meatus obstruction. Electronically Signed   By: Tiburcio Pea M.D.   On: 11/16/2021 06:26   CT CHEST ABDOMEN PELVIS W CONTRAST  Result Date: 11/16/2021 CLINICAL DATA:  Sepsis. Nausea and vomiting that started 2 hours ago with tremors EXAM: CT CHEST, ABDOMEN, AND PELVIS WITH CONTRAST TECHNIQUE:  Multidetector CT imaging of the chest, abdomen and pelvis was performed following the standard protocol during bolus administration of intravenous contrast. RADIATION DOSE REDUCTION: This exam was performed according to the departmental dose-optimization program which includes automated exposure control, adjustment of the mA and/or kV according to patient size and/or use of iterative reconstruction technique. CONTRAST:  OMNIPAQUE IOHEXOL 300 MG/ML  SOLN COMPARISON:  07/26/2020 FINDINGS: CT CHEST FINDINGS Cardiovascular: Normal heart size. No pericardial effusion. Extensive atheromatous calcification of the aorta and coronaries. Mediastinum/Nodes: Negative for adenopathy or mass. Lungs/Pleura: Dependent opacity in the left more than right lungs with volume loss. No definite pneumonia. No edema or effusion. Musculoskeletal: Generalized spondylosis CT ABDOMEN PELVIS FINDINGS Hepatobiliary: Hepatic steatosis.No evidence of biliary obstruction or stone. Pancreas: Unremarkable. Spleen: Unremarkable. Adrenals/Urinary Tract: Negative adrenals. No hydronephrosis or stone. Unremarkable bladder. Stomach/Bowel:  No obstruction. No appendicitis. Vascular/Lymphatic: No acute vascular abnormality. Extensive atheromatous calcification of the aorta and branch vessels. No mass or adenopathy. Reproductive:No pathologic findings. Other: No ascites or pneumoperitoneum. Musculoskeletal: No acute abnormalities. Ordinary lumbar spine degeneration. IMPRESSION: 1. No bowel obstruction or visible inflammation. 2. Atelectasis in the left more than right dependent lung. 3. Atherosclerosis including the coronary arteries. Electronically Signed   By: Tiburcio Pea M.D.   On: 11/16/2021 05:11   DG Chest Port 1 View  Result Date: 11/16/2021 CLINICAL DATA:  Possible sepsis EXAM: PORTABLE CHEST 1 VIEW COMPARISON:  07/28/2020 FINDINGS: Heart is mildly enlarged in size. Aortic calcifications are noted. Elevation of the right hemidiaphragm  is seen. No focal infiltrate is noted. Abnormality is seen. IMPRESSION: No active disease. Electronically Signed   By: Alcide Clever M.D.   On: 11/16/2021 03:16      Labs: BNP (last 3 results) Recent Labs    11/16/21 0221  BNP 52.1   Basic Metabolic Panel: Recent Labs  Lab 11/16/21 0221 11/17/21 0528 11/18/21 0414 11/19/21 0421 11/20/21 0348  NA 140 138 135 134* 131*  K 4.1 3.8 4.0 3.8 3.4*  CL 101 104 106 105 101  CO2 25 27 25 23 22   GLUCOSE 192* 138* 118* 111* 129*  BUN 15 15 14 12 10   CREATININE 1.09 1.22 1.05 0.95 0.86  CALCIUM 8.9 8.0* 7.9* 7.6* 8.0*  MG  --  1.3* 2.1 1.7 2.0  PHOS  --  3.7 2.7  --   --    Liver Function Tests: Recent Labs  Lab 11/16/21 0221 11/16/21 1309 11/17/21 0528 11/18/21 0414  AST 314* 280* 147* 108*  ALT 115* 149* 119* 97*  ALKPHOS 140* 113 104 130*  BILITOT 1.4* 2.5*  3.2* 3.3*  PROT 7.9 6.4* 6.3* 6.2*  ALBUMIN 3.9 2.9* 3.0* 2.6*   Recent Labs  Lab 11/16/21 0218 11/17/21 0528  LIPASE 284* 28   Recent Labs  Lab 11/16/21 0558  AMMONIA 18   CBC: Recent Labs  Lab 11/16/21 0221 11/17/21 0528 11/18/21 0414 11/19/21 0421 11/20/21 0348  WBC 16.2* 15.7* 9.1 7.7 8.9  NEUTROABS 14.8*  --   --   --   --   HGB 13.1 10.1* 10.3* 10.5* 11.0*  HCT 42.7 32.0* 32.1* 32.6* 34.0*  MCV 92.6 91.4 90.2 87.6 87.0  PLT 294 206 180 188 224   Cardiac Enzymes: Recent Labs  Lab 11/16/21 0558  CKTOTAL 84   BNP: Invalid input(s): "POCBNP" CBG: Recent Labs  Lab 11/16/21 0534 11/16/21 0637 11/17/21 0738 11/19/21 1053  GLUCAP 147* 133* 132* 136*   D-Dimer No results for input(s): "DDIMER" in the last 72 hours. Hgb A1c No results for input(s): "HGBA1C" in the last 72 hours. Lipid Profile No results for input(s): "CHOL", "HDL", "LDLCALC", "TRIG", "CHOLHDL", "LDLDIRECT" in the last 72 hours. Thyroid function studies No results for input(s): "TSH", "T4TOTAL", "T3FREE", "THYROIDAB" in the last 72 hours.  Invalid input(s):  "FREET3" Anemia work up No results for input(s): "VITAMINB12", "FOLATE", "FERRITIN", "TIBC", "IRON", "RETICCTPCT" in the last 72 hours. Urinalysis    Component Value Date/Time   COLORURINE AMBER (A) 11/16/2021 0221   APPEARANCEUR HAZY (A) 11/16/2021 0221   APPEARANCEUR Clear 10/16/2014 1539   LABSPEC 1.018 11/16/2021 0221   PHURINE 5.0 11/16/2021 0221   GLUCOSEU NEGATIVE 11/16/2021 0221   HGBUR MODERATE (A) 11/16/2021 0221   BILIRUBINUR NEGATIVE 11/16/2021 0221   BILIRUBINUR Negative 10/16/2014 1539   KETONESUR NEGATIVE 11/16/2021 0221   PROTEINUR NEGATIVE 11/16/2021 0221   NITRITE NEGATIVE 11/16/2021 0221   LEUKOCYTESUR LARGE (A) 11/16/2021 0221   Sepsis Labs Recent Labs  Lab 11/17/21 0528 11/18/21 0414 11/19/21 0421 11/20/21 0348  WBC 15.7* 9.1 7.7 8.9   Microbiology Recent Results (from the past 240 hour(s))  Resp Panel by RT-PCR (Flu A&B, Covid) Anterior Nasal Swab     Status: None   Collection Time: 11/16/21  2:21 AM   Specimen: Anterior Nasal Swab  Result Value Ref Range Status   SARS Coronavirus 2 by RT PCR NEGATIVE NEGATIVE Final    Comment: (NOTE) SARS-CoV-2 target nucleic acids are NOT DETECTED.  The SARS-CoV-2 RNA is generally detectable in upper respiratory specimens during the acute phase of infection. The lowest concentration of SARS-CoV-2 viral copies this assay can detect is 138 copies/mL. A negative result does not preclude SARS-Cov-2 infection and should not be used as the sole basis for treatment or other patient management decisions. A negative result may occur with  improper specimen collection/handling, submission of specimen other than nasopharyngeal swab, presence of viral mutation(s) within the areas targeted by this assay, and inadequate number of viral copies(<138 copies/mL). A negative result must be combined with clinical observations, patient history, and epidemiological information. The expected result is Negative.  Fact Sheet for  Patients:  BloggerCourse.com  Fact Sheet for Healthcare Providers:  SeriousBroker.it  This test is no t yet approved or cleared by the [place] FDA and  has been authorized for detection and/or diagnosis of SARS-CoV-2 by FDA under an Emergency Use Authorization (EUA). This EUA will remain  in effect (meaning this test can be used) for the duration of the COVID-19 declaration under Section 564(b)(1) of the Act, 21 U.S.C.section 360bbb-3(b)(1), unless the authorization is terminated  or revoked sooner.       Influenza A by PCR NEGATIVE NEGATIVE Final   Influenza B by PCR NEGATIVE NEGATIVE Final    Comment: (NOTE) The Xpert Xpress SARS-CoV-2/FLU/RSV plus assay is intended as an aid in the diagnosis of influenza from Nasopharyngeal swab specimens and should not be used as a sole basis for treatment. Nasal washings and aspirates are unacceptable for Xpert Xpress SARS-CoV-2/FLU/RSV testing.  Fact Sheet for Patients: BloggerCourse.com  Fact Sheet for Healthcare Providers: SeriousBroker.it  This test is not yet approved or cleared by the [place] FDA and has been authorized for detection and/or diagnosis of SARS-CoV-2 by FDA under an Emergency Use Authorization (EUA). This EUA will remain in effect (meaning this test can be used) for the duration of the COVID-19 declaration under Section 564(b)(1) of the Act, 21 U.S.C. section 360bbb-3(b)(1), unless the authorization is terminated or revoked.  Performed at [facility], [address] [place], [place] [address]   Blood Culture (routine x 2)     Status: Abnormal   Collection Time: 11/16/21  2:21 AM   Specimen: BLOOD  Result Value Ref Range Status   Specimen Description   Final    BLOOD RIGHT [facility] Performed at [facility], [address] [place], [place] [address]    Special Requests   Final     BOTTLES DRAWN AEROBIC AND ANAEROBIC Blood Culture adequate volume Performed at [facility], [address], [place], [place] [address]    Culture  Setup Time   Final    GRAM NEGATIVE RODS IN BOTH AEROBIC AND ANAEROBIC BOTTLES CRITICAL VALUE NOTED.  VALUE IS CONSISTENT WITH PREVIOUSLY REPORTED AND CALLED VALUE. Performed at [facility], [address] [place], [place] [address]    Culture (A)  Final    ESCHERICHIA COLI SUSCEPTIBILITIES PERFORMED ON PREVIOUS CULTURE WITHIN THE LAST 5 DAYS. Performed at [facility], [address] [place], [place] [address]    Report Status 11/18/2021 FINAL  Final  Blood Culture (routine x 2)     Status: Abnormal   Collection Time: 11/16/21  2:21 AM   Specimen: BLOOD  Result Value Ref Range Status   Specimen Description   Final    BLOOD RIGHT HAND Performed at [facility], [address] [place], [place] [address]    Special Requests   Final    BOTTLES DRAWN AEROBIC AND ANAEROBIC Blood Culture adequate volume Performed at [facility], [address], [place], [place] [address]    Culture  Setup Time   Final    GRAM NEGATIVE RODS IN BOTH AEROBIC AND ANAEROBIC BOTTLES CRITICAL RESULT CALLED TO, READ BACK BY AND VERIFIED WITH: ALEX CHAPPELL 1312 11/16/21 MU Performed at [facility], [address], [place], [place] [address]    Culture ESCHERICHIA COLI (A)  Final   Report Status 11/18/2021 FINAL  Final   Organism ID, Bacteria ESCHERICHIA COLI  Final      Susceptibility   Escherichia coli - MIC*    AMPICILLIN 8 SENSITIVE Sensitive     CEFAZOLIN <=4 SENSITIVE Sensitive     CEFEPIME <=0.12 SENSITIVE Sensitive     CEFTAZIDIME <=1 SENSITIVE Sensitive     CEFTRIAXONE <=0.25 SENSITIVE Sensitive     CIPROFLOXACIN <=0.25 SENSITIVE Sensitive     GENTAMICIN <=1 SENSITIVE Sensitive     IMIPENEM <=0.25 SENSITIVE Sensitive     TRIMETH/SULFA <=20 SENSITIVE Sensitive     AMPICILLIN/SULBACTAM <=2  SENSITIVE  Sensitive     PIP/TAZO <=4 SENSITIVE Sensitive     * ESCHERICHIA COLI  Urine Culture     Status: Abnormal   Collection Time: 11/16/21  2:21 AM   Specimen: In/Out Cath Urine  Result Value Ref Range Status   Specimen Description   Final    IN/OUT CATH URINE Performed at [facility], [address] [place], [place] [address]    Special Requests   Final    NONE Performed at [facility], [address] [place], [place] [address]    Culture MULTIPLE SPECIES PRESENT, SUGGEST RECOLLECTION (A)  Final   Report Status 11/17/2021 FINAL  Final  Blood Culture ID Panel (Reflexed)     Status: Abnormal   Collection Time: 11/16/21  2:21 AM  Result Value Ref Range Status   Enterococcus faecalis NOT DETECTED NOT DETECTED Final   Enterococcus Faecium NOT DETECTED NOT DETECTED Final   Listeria monocytogenes NOT DETECTED NOT DETECTED Final   Staphylococcus species NOT DETECTED NOT DETECTED Final   Staphylococcus aureus (BCID) NOT DETECTED NOT DETECTED Final   Staphylococcus epidermidis NOT DETECTED NOT DETECTED Final   Staphylococcus lugdunensis NOT DETECTED NOT DETECTED Final   Streptococcus species NOT DETECTED NOT DETECTED Final   Streptococcus agalactiae NOT DETECTED NOT DETECTED Final   Streptococcus pneumoniae NOT DETECTED NOT DETECTED Final   Streptococcus pyogenes NOT DETECTED NOT DETECTED Final   A.calcoaceticus-baumannii NOT DETECTED NOT DETECTED Final   Bacteroides fragilis NOT DETECTED NOT DETECTED Final   Enterobacterales DETECTED (A) NOT DETECTED Final    Comment: Enterobacterales represent a large order of gram negative bacteria, not a single organism. CRITICAL RESULT CALLED TO, READ BACK BY AND VERIFIED WITH:  ALEX CHAPPELL 1312 11/16/21 MU    Enterobacter cloacae complex NOT DETECTED NOT DETECTED Final   Escherichia coli DETECTED (A) NOT DETECTED Final    Comment: CRITICAL RESULT CALLED TO, READ BACK BY AND VERIFIED WITH: ALEX CHAPPELL 1312 11/16/21  MU    Klebsiella aerogenes NOT DETECTED NOT DETECTED Final   Klebsiella oxytoca NOT DETECTED NOT DETECTED Final   Klebsiella pneumoniae NOT DETECTED NOT DETECTED Final   Proteus species NOT DETECTED NOT DETECTED Final   Salmonella species NOT DETECTED NOT DETECTED Final   Serratia marcescens NOT DETECTED NOT DETECTED Final   Haemophilus influenzae NOT DETECTED NOT DETECTED Final   Neisseria meningitidis NOT DETECTED NOT DETECTED Final   Pseudomonas aeruginosa NOT DETECTED NOT DETECTED Final   Stenotrophomonas maltophilia NOT DETECTED NOT DETECTED Final   Candida albicans NOT DETECTED NOT DETECTED Final   Candida auris NOT DETECTED NOT DETECTED Final   Candida glabrata NOT DETECTED NOT DETECTED Final   Candida krusei NOT DETECTED NOT DETECTED Final   Candida parapsilosis NOT DETECTED NOT DETECTED Final   Candida tropicalis NOT DETECTED NOT DETECTED Final   Cryptococcus neoformans/gattii NOT DETECTED NOT DETECTED Final   CTX-M ESBL NOT DETECTED NOT DETECTED Final   Carbapenem resistance IMP NOT DETECTED NOT DETECTED Final   Carbapenem resistance KPC NOT DETECTED NOT DETECTED Final   Carbapenem resistance NDM NOT DETECTED NOT DETECTED Final   Carbapenem resist OXA 48 LIKE NOT DETECTED NOT DETECTED Final   Carbapenem resistance VIM NOT DETECTED NOT DETECTED Final    Comment: Performed at [facility], [address] [place], [place] [address]  MRSA Next Gen by PCR, Nasal     Status: None   Collection Time: 11/16/21  6:30 AM   Specimen: Nasal Mucosa; Nasal Swab  Result Value  Ref Range Status   MRSA by PCR Next Gen NOT DETECTED NOT DETECTED Final    Comment: (NOTE) The GeneXpert MRSA Assay (FDA approved for NASAL specimens only), is one component of a comprehensive MRSA colonization surveillance program. It is not intended to diagnose MRSA infection nor to guide or monitor treatment for MRSA infections. Test performance is not FDA approved in patients less than 21  years old. Performed at [facility], [address] [place], [place] [address]      Total time spend on discharging this patient, including the last patient exam, discussing the hospital stay, instructions for ongoing care as it relates to all pertinent caregivers, as well as preparing the medical discharge records, prescriptions, and/or referrals as applicable, is 40 minutes.    Darlin Priestly, MD  [facility] 11/20/2021, 2:56 PM

## 2021-11-20 NOTE — TOC Transition Note (Addendum)
Transition of Care [facility]) - CM/SW Discharge Note   Patient Details  Name: Jeff Key MRN: [id number] Date of Birth: 1946-02-02  [facility]) CM/SW Contact:  Luvenia Redden, RN Phone Number:[phone] 11/20/2021, 3:54 PM   Clinical Narrative:    Recommendations for HHPT. Spoke with spouse Agustin Cree) who was at bedside concerning recommended HHPT receptive. Also confirmed EMS would be needed for transportation for discharge.  [facility] ([place])-Declined [facility]Declined [facility] ([place]) accepted for HHPT. [facility]TA Edson Snowball) approved for [facility] auth# [id number] for transport today 9/3 to home.   Spouse confirms she received the [facility] lift with no other needs at this time. [facility] called for non-emergent transport to home address. Medical necessity printed to floor for EMS. No further needs presented at this time. Team is aware of the above arrangements for discharge disposition today.  TOC remains available for any ongoing needs.   Final next level of care: Home w Home Health Services Barriers to Discharge: Continued Medical Work up   Patient Goals and CMS Choice Patient states their goals for this hospitalization and ongoing recovery are:: Wife wants to get patient back home   Choice offered to / list presented to : Spouse  Discharge Placement                  Name of family member notified: [name] Patient and family notified of of transfer: 11/20/21  Discharge Plan and Services   Discharge Planning Services: CM Consult            DME Arranged: Other see comment Michiel Sites Lift) DME Agency: [employer] Date DME Agency Contacted: 11/17/21 Time DME Agency Contacted: [phone] Representative spoke with at DME Agency: [phone] [facility] Arranged: Patient Refused [facility] HH Agency: [facility] Date [facility] Agency Contacted: 11/20/21 Time HH Agency Contacted: [phone] Representative spoke with at [facility] Agency: Denyse Amass  Social Determinants of Health  (SDOH) Interventions     Readmission Risk Interventions     No data to display

## 2021-11-20 NOTE — Evaluation (Signed)
Physical Therapy Evaluation Patient Details Name: Jeff Key MRN: [id number] DOB: 10-Dec-1945 Today's Date: 11/20/2021  History of Present Illness  Pt is a 76 y/o male admitted secondary to nausea, vomitting and tremors. Pt found to have severe sepsis. PMH including but not limited to advanced dementia, CAD, ischemic cardiomyopathy, GERD, chronic ulcerative proctosigmoiditis, DM.   Clinical Impression  Pt presented supine in bed with HOB elevated, asleep and very difficult to arouse. He only briefly opened his eyes occasionally with noxious stimuli (sternal rub) and kinesthetic input with bed mobility during pericare. Pt's wife and daughter were both present throughout and provided all history information. Prior to admission, pt required 1-2 person assistance for all mobility and ADLs. He was previously able to complete transfers to a w/c with one person assistance and would occasionally ambulate a very short distance with two person assistance. Pt's wife reported that they have a plethora of equipment at home, including a Nurse, adult and hospital bed. Pt unable to participate in a full assessment; however, he was noted to have been incontinent of BM in bed and required two person total assistance for rolling and pericare. Family is adamant about pt returning home with their support and have all the necessary DME at home already. Would recommend some HHPT to potentially assist with decreased burden of care. Family is agreeable. No further acute PT needs identified at this time. PT signing off.      Recommendations for follow up therapy are one component of a multi-disciplinary discharge planning process, led by the attending physician.  Recommendations may be updated based on patient status, additional functional criteria and insurance authorization.  Follow Up Recommendations Home health PT       Assistance Recommended at Discharge Frequent or constant Supervision/Assistance  Patient can return  home with the following  Two people to help with walking and/or transfers;Two people to help with bathing/dressing/bathroom;Assistance with cooking/housework;Assistance with feeding;Direct supervision/assist for medications management;Direct supervision/assist for financial management;Assist for transportation;Help with stairs or ramp for entrance    Equipment Recommendations None recommended by PT  Recommendations for Other Services       Functional Status Assessment Patient has had a recent decline in their functional status and/or demonstrates limited ability to make significant improvements in function in a reasonable and predictable amount of time     Precautions / Restrictions Precautions Precautions: Fall Precaution Comments: incontinent Restrictions Weight Bearing Restrictions: No      Mobility  Bed Mobility Overal bed mobility: Needs Assistance Bed Mobility: Rolling Rolling: Total assist, +2 for physical assistance         General bed mobility comments: total A x2 to roll bilaterally several times for pericare and linen changes due to pt having BM in bed    Transfers                   General transfer comment: would require a mechanical lift    Ambulation/Gait                  Stairs            Wheelchair Mobility    Modified Rankin (Stroke Patients Only)       Balance                                             Pertinent Vitals/Pain Pain Assessment Pain  Assessment: Faces Faces Pain Scale: Hurts little more Pain Location: generalized with movement Pain Intervention(s): Monitored during session    Home Living Family/patient expects to be discharged to:: Private residence Living Arrangements: Spouse/significant other Available Help at Discharge: Family;Available 24 hours/day             Home Equipment: Rolling Walker (2 wheels);BSC/3in1;Shower seat;Wheelchair - manual;Transport chair;Hospital bed;Other  (comment) (HOYER LIFT)      Prior Function Prior Level of Function : Needs assist             Mobility Comments: per wife and daughter's report, pt is total care/dependent at baseline. They report that he is occasionally able to assist with bed mobility and transfers. Daughter stated that he will occasionally ambulate a very short distance with two person physical assistance (will not use an AD). Mostly transfers from bed to w/c, unable to propel himself in his w/c ADLs Comments: dependent     Hand Dominance        Extremity/Trunk Assessment   Upper Extremity Assessment Upper Extremity Assessment: Difficult to assess due to impaired cognition    Lower Extremity Assessment Lower Extremity Assessment: Difficult to assess due to impaired cognition       Communication   Communication: Expressive difficulties;Receptive difficulties  Cognition Arousal/Alertness: Lethargic Behavior During Therapy: Flat affect Overall Cognitive Status: History of cognitive impairments - at baseline                                 General Comments: severe dementia at baseline; unable to follow commands, briefly opening eyes intermittently        General Comments      Exercises     Assessment/Plan    PT Assessment Patient does not need any further PT services  PT Problem List         PT Treatment Interventions      PT Goals (Current goals can be found in the Care Plan section)  Acute Rehab PT Goals Patient Stated Goal: unable to state - family wishes to take pt back home to care for him PT Goal Formulation: All assessment and education complete, DC therapy    Frequency       Co-evaluation               AM-PAC PT "6 Clicks" Mobility  Outcome Measure Help needed turning from your back to your side while in a flat bed without using bedrails?: Total Help needed moving from lying on your back to sitting on the side of a flat bed without using bedrails?:  Total Help needed moving to and from a bed to a chair (including a wheelchair)?: Total Help needed standing up from a chair using your arms (e.g., wheelchair or bedside chair)?: Total Help needed to walk in hospital room?: Total Help needed climbing 3-5 steps with a railing? : Total 6 Click Score: 6    End of Session   Activity Tolerance: Patient tolerated treatment well Patient left: in bed;with call bell/phone within reach;with bed alarm set;with family/visitor present Nurse Communication: Mobility status;Need for lift equipment PT Visit Diagnosis: Other abnormalities of gait and mobility (R26.89)    Time: 1007-1219 PT Time Calculation (min) (ACUTE ONLY): 53 min   Charges:   PT Evaluation $PT Eval Moderate Complexity: 1 Mod PT Treatments $Therapeutic Activity: 38-52 mins        Arletta Bale, DPT  Acute Rehabilitation Services Office [phone]  Alessandra Bevels Kiren Mcisaac 11/20/2021, 1:09 PM

## 2021-11-20 NOTE — Plan of Care (Signed)
  Problem: Health Behavior/Discharge Planning: Goal: Ability to manage health-related needs will improve Outcome: Progressing   Problem: Nutrition: Goal: Adequate nutrition will be maintained Outcome: Progressing   Problem: Elimination: Goal: Will not experience complications related to bowel motility Outcome: Progressing   Problem: Safety: Goal: Ability to remain free from injury will improve Outcome: Progressing   Problem: Skin Integrity: Goal: Risk for impaired skin integrity will decrease Outcome: Progressing

## 2021-11-29 DIAGNOSIS — Z09 Encounter for follow-up examination after completed treatment for conditions other than malignant neoplasm: Secondary | ICD-10-CM | POA: Diagnosis not present

## 2021-11-29 DIAGNOSIS — K5130 Ulcerative (chronic) rectosigmoiditis without complications: Secondary | ICD-10-CM | POA: Diagnosis not present

## 2021-12-17 DIAGNOSIS — R7989 Other specified abnormal findings of blood chemistry: Secondary | ICD-10-CM | POA: Diagnosis not present

## 2021-12-17 DIAGNOSIS — A419 Sepsis, unspecified organism: Secondary | ICD-10-CM | POA: Diagnosis not present

## 2021-12-17 DIAGNOSIS — J9601 Acute respiratory failure with hypoxia: Secondary | ICD-10-CM | POA: Diagnosis not present

## 2022-06-05 DIAGNOSIS — K5130 Ulcerative (chronic) rectosigmoiditis without complications: Secondary | ICD-10-CM | POA: Diagnosis not present

## 2022-06-28 ENCOUNTER — Telehealth: Payer: Self-pay | Admitting: *Deleted

## 2022-06-28 ENCOUNTER — Encounter: Payer: Self-pay | Admitting: *Deleted

## 2022-06-28 NOTE — Patient Outreach (Signed)
  Care Coordination   Initial Visit Note   06/28/2022 Name: Jeff Key MRN: [id number] DOB: 1945-12-22  Jeff Key is a 77 y.o. year old male who sees [name], MD for primary care. I spoke with Jeff Key, wife of Jeff Key by phone today.  What matters to the patients health and wellness today?  Wife is patient's primary caregiver, patient is dependent and wheelchair bound.  Has family in the area that helps wife care for patient.  She is wanting to keep him in the home as long as possible. Denies any urgent concerns, encouraged to contact this care manager with questions.      Goals Addressed             This Visit's Progress    Effective management of Alzheimer's       Care Coordination Interventions: Evaluation of current treatment plan related to Alzheimer's and patient's adherence to plan as established by provider Advised patient to consider palliative care.  Discussed purpose and benefits with wife Provided education to patient re: private pay aide services and respite resources Reviewed medications with patient and discussed affordability and adherence Collaborated with PCP  regarding need for new wheelchair Reviewed scheduled/upcoming provider appointments including need for regular provider visits.  No PCP visits since September 2023, which was virtual Discussed plans with patient for ongoing care management follow up and provided patient with direct contact information for care management team Screening for signs and symptoms of depression related to chronic disease state  Assessed social determinant of health barriers         SDOH assessments and interventions completed:  Yes  SDOH Interventions Today    Flowsheet Row Most Recent Value  SDOH Interventions   Food Insecurity Interventions Intervention Not Indicated  Housing Interventions Intervention Not Indicated  Transportation Interventions Intervention Not Indicated  Utilities  Interventions Intervention Not Indicated        Care Coordination Interventions:  Yes, provided   Interventions Today    Flowsheet Row Most Recent Value  Chronic Disease   Chronic disease during today's visit Other  General Interventions   General Interventions Discussed/Reviewed General Interventions Reviewed, [employer], Communication with, Doctor Visits, Horticulturist, commercial (DME)  Doctor Visits Discussed/Reviewed Doctor Visits Discussed, PCP  [encouraged to consider having provider visit the home]  Horticulturist, commercial (DME) Wheelchair  [need new Teacher, early years/pre with PCP/Specialists  Education Interventions   Education Provided Provided Education  Provided Verbal Education On When to see the doctor, Community Resources        Follow up plan: Follow up call scheduled for 5/8    Encounter Outcome:  Pt. Visit Completed   Kemper Durie, RN, MSN, [facility] Care Management Care Management Coordinator [phone]

## 2022-07-04 DIAGNOSIS — R262 Difficulty in walking, not elsewhere classified: Secondary | ICD-10-CM | POA: Diagnosis not present

## 2022-07-26 ENCOUNTER — Ambulatory Visit: Payer: Self-pay | Admitting: *Deleted

## 2022-07-26 NOTE — Patient Outreach (Signed)
  Care Coordination   Follow Up Visit Note   07/26/2022 Name: Jeff Key MRN: [id number] DOB: 19-Feb-1946  Jeff Key is a 77 y.o. year old male who sees Jeff Reichmann, MD for primary care. I spoke with Jeff Key, wife of Jeff Key by phone today.  What matters to the patients health and wellness today?  Obtain wheelchair and schedule visit with PCP.    Goals Addressed             This Visit's Progress    Effective management of Alzheimer's   On track    Care Coordination Interventions: Evaluation of current treatment plan related to Alzheimer's and patient's adherence to plan as established by provider Advised patient to consider palliative care.  Discussed purpose and benefits with wife Provided education to patient re: private pay aide services and respite resources Reviewed medications with patient and discussed affordability and adherence Collaborated with PCP  regarding need for new wheelchair Reviewed scheduled/upcoming provider appointments including need for regular provider visits.  No PCP visits since September 2023, which was virtual Discussed plans with patient for ongoing care management follow up and provided patient with direct contact information for care management team         SDOH assessments and interventions completed:  No     Care Coordination Interventions:  Yes, provided   Interventions Today    Flowsheet Row Most Recent Value  Chronic Disease   Chronic disease during today's visit Other  [Dementia]  General Interventions   General Interventions Discussed/Reviewed Durable Medical Equipment (DME), General Interventions Reviewed, Doctor Visits  Doctor Visits Discussed/Reviewed Doctor Visits Discussed, PCP  [No PCP visit in almost year, which was virtual. Will send names of providers that visit home so wife can consider having someone in the home versus virtual]  Durable Medical Equipment (DME) Wheelchair  [Wife working with [employer]  to obtain wheelchair that will fit in the home and through doors]  Wheelchair Standard        Follow up plan: Follow up call scheduled for 5/22    Encounter Outcome:  Pt. Visit Completed   Kemper Durie, RN, MSN, [facility] Care Management Care Management Coordinator [phone]

## 2022-07-26 NOTE — Patient Instructions (Signed)
Visit Information  Thank you for taking time to visit with me today. Please don't hesitate to contact me if I can be of assistance to you before our next scheduled telephone appointment.  Following are the goals we discussed today:  Review names of doctors below that will make visits to the home:  [facility]  [address] [place], [place] [address] [phone]   [facility]  [address] [place], [place] [address] [phone]   [facility] [address], [place], [place] [address], [place]. [phone].    Our next appointment is by telephone on 5/22  Please call the care guide team at [phone] if you need to cancel or reschedule your appointment.   Please call the Suicide and Crisis Lifeline: 988 call the [place] National Suicide Prevention Lifeline: 830-823-9289 or TTY: 971-613-2126 TTY 450 628 1824) to talk to a trained counselor call 1-800-273-TALK (toll free, 24 hour hotline) call 911 if you are experiencing a Mental Health or Behavioral Health Crisis or need someone to talk to.  The patient verbalized understanding of instructions, educational materials, and care plan provided today and agreed to receive a mailed copy of patient instructions, educational materials, and care plan.   The patient has been provided with contact information for the care management team and has been advised to call with any health related questions or concerns.   Kemper Durie, RN, MSN, [facility] Care Management Care Management Coordinator [phone]

## 2022-08-09 ENCOUNTER — Ambulatory Visit: Payer: Self-pay | Admitting: *Deleted

## 2022-08-09 NOTE — Patient Outreach (Signed)
  Care Coordination   08/09/2022 Name: MARKES BAUGHN MRN: [id number] DOB: 15-Mar-1946   Care Coordination Outreach Attempts:  An unsuccessful telephone outreach was attempted for a scheduled appointment today.  Follow Up Plan:  Additional outreach attempts will be made to offer the patient care coordination information and services.   Encounter Outcome:  No Answer   Care Coordination Interventions:  No, not indicated    Kemper Durie, RN, MSN, [facility] Care Management Care Management Coordinator [phone]

## 2022-08-11 ENCOUNTER — Telehealth: Payer: Self-pay | Admitting: *Deleted

## 2022-08-11 DIAGNOSIS — F0280 Dementia in other diseases classified elsewhere without behavioral disturbance: Secondary | ICD-10-CM

## 2022-08-11 NOTE — Addendum Note (Signed)
Addended byKemper Durie on: 08/11/2022 09:07 PM   Modules accepted: Orders

## 2022-08-11 NOTE — Patient Outreach (Signed)
  Care Coordination   Follow Up Visit Note   08/11/2022 Name: Jeff Key MRN: [id number] DOB: 1945/05/25  Jeff Key is a 77 y.o. year old male who sees [name], MD for primary care. I spoke with  Lillia Dallas by phone today.  What matters to the patients health and wellness today?  Getting wheelchair and attending PCP visit.    Goals Addressed             This Visit's Progress    Effective management of Alzheimer's   On track    Care Coordination Interventions: Evaluation of current treatment plan related to Alzheimer's and patient's adherence to plan as established by provider Advised patient to consider palliative care.  Discussed purpose and benefits with wife Provided education to patient re: private pay aide services and respite resources Reviewed medications with patient and discussed affordability and adherence Collaborated with PCP  regarding need for new wheelchair Reviewed scheduled/upcoming provider appointments including need for regular provider visits.  No PCP visits since September 2023, which was virtual Discussed plans with patient for ongoing care management follow up and provided patient with direct contact information for care management team         SDOH assessments and interventions completed:  No     Care Coordination Interventions:  Yes, provided   Interventions Today    Flowsheet Row Most Recent Value  Chronic Disease   Chronic disease during today's visit Other  [Alzheimer's]  General Interventions   General Interventions Discussed/Reviewed Durable Medical Equipment (DME), General Interventions Reviewed, Doctor Visits, Communication with  Doctor Visits Discussed/Reviewed Doctor Visits Reviewed, PCP  [Reminded of need for PCP visit, has reviewed list of providers making home visits, has not decided to use yet.  Feel she could get patient to MD appointment if she has wheelchair]  Communication with --  [Referral placed to  community resource care guide for transportation assistance (wheelchair capable vehicle)]        Follow up plan: Follow up call scheduled for 6/4    Encounter Outcome:  Pt. Visit Completed   Kemper Durie, RN, MSN, [facility] Care Management Care Management Coordinator [phone]

## 2022-08-16 ENCOUNTER — Telehealth: Payer: Self-pay | Admitting: *Deleted

## 2022-08-16 NOTE — Telephone Encounter (Signed)
   Telephone encounter was:  Unsuccessful.  08/16/2022 Name: Jeff Key MRN: [id number] DOB: 1945-12-08  Unsuccessful outbound call made today to assist with:  Transportation Needs   Outreach Attempt:  1st Attempt  A HIPAA compliant voice message was left requesting a return call.  Instructed patient to call back at [phone]. Yehuda Mao Greenauer -[facility] [place], Population Health [phone] [address] , [place] [address] Email : [email or web address]

## 2022-08-18 ENCOUNTER — Telehealth: Payer: Self-pay | Admitting: *Deleted

## 2022-08-18 NOTE — Telephone Encounter (Signed)
   Telephone encounter was:  Unsuccessful.  08/18/2022 Name: Jeff Key MRN: [id number] DOB: 28-Mar-1945  Unsuccessful outbound call made today to assist with:  Transportation Needs   Outreach Attempt:  2nd Attempt  A HIPAA compliant voice message was left requesting a return call.  Instructed patient to call back at [phone].  Yehuda Mao Greenauer -[facility] [place], Population Health [phone] [address] [place] , [place] [address] Email : [email or web address]

## 2022-08-21 ENCOUNTER — Telehealth: Payer: Self-pay | Admitting: *Deleted

## 2022-08-21 NOTE — Telephone Encounter (Signed)
   Telephone encounter was:  Successful.  08/21/2022 Name: MAICOL BOBICK MRN: [id number] DOB: Mar 14, 1946  OJI CASAGRANDE is a 77 y.o. year old male who is a primary care patient of Hande, Roderic Palau, MD . The community resource team was consulted for assistance with Transportation Needs   Care guide performed the following interventions: Patient provided with information about care guide support team and interviewed to confirm resource needs. patient currently has no appts scheduled provided information [facility] transportation and also [employer] and [employer]  Follow Up Plan:  No further follow up planned at this time. The patient has been provided with needed resources.  Yehuda Mao Greenauer -[facility] [place], Population Health [phone] [address] , [place] [address] Email : [email or web address]

## 2022-08-22 ENCOUNTER — Ambulatory Visit: Payer: Self-pay | Admitting: *Deleted

## 2022-08-22 NOTE — Patient Outreach (Signed)
  Care Coordination   Follow Up Visit Note   08/22/2022 Name: Jeff Key MRN: [id number] DOB: 08/18/1945  Jeff Key is a 77 y.o. year old male who sees [name], MD for primary care. I spoke with wife of Jeff Key by phone today.  What matters to the patients health and wellness today?  Obtain the right size wheelchair for home    Goals Addressed             This Visit's Progress    Effective management of Alzheimer's   On track    Care Coordination Interventions: Evaluation of current treatment plan related to Alzheimer's and patient's adherence to plan as established by provider Advised patient to consider palliative care.  Discussed purpose and benefits with wife Provided education to patient re: private pay aide services and respite resources Reviewed medications with patient and discussed affordability and adherence Collaborated with [employer] regarding need for new wheelchair Reviewed scheduled/upcoming provider appointments including need for regular provider visits.  Virtual visit on 4/16 with PCP Discussed plans with patient for ongoing care management follow up and provided patient with direct contact information for care management team         SDOH assessments and interventions completed:  No     Care Coordination Interventions:  Yes, provided   Interventions Today    Flowsheet Row Most Recent Value  Chronic Disease   Chronic disease during today's visit Other  [Dementia]  General Interventions   General Interventions Discussed/Reviewed General Interventions Reviewed, Durable Medical Equipment (DME), Communication with  Durable Medical Equipment (DME) Wheelchair  Wheelchair Standard  Communication with --  [Call placed to [employer], inquired about order for wheelchair as wife state the one that was ordered was too big for the home.  [employer] will call patient directly to confirm the correct size that will need to be ordered]         Follow up plan: Follow up call scheduled for 6/18    Encounter Outcome:  Pt. Visit Completed   Kemper Durie, RN, MSN, [facility] Care Management Care Management Coordinator [phone]

## 2022-09-05 ENCOUNTER — Encounter: Payer: PPO | Admitting: *Deleted

## 2022-09-06 ENCOUNTER — Ambulatory Visit: Payer: Self-pay | Admitting: *Deleted

## 2022-09-06 NOTE — Patient Outreach (Signed)
  Care Coordination   Follow Up Visit Note   09/07/2022 Name: Jeff Key MRN: [id number] DOB: Feb 18, 1946  Jeff Key is a 77 y.o. year old male who sees [name], MD for primary care. I spoke with Jeff Key, wife of Jeff Key by phone today.  What matters to the patients health and wellness today?  Obtain wheelchair    Goals Addressed             This Visit's Progress    Effective management of Alzheimer's       Care Coordination Interventions: Evaluation of current treatment plan related to Alzheimer's and patient's adherence to plan as established by provider Advised patient to consider palliative care.  Discussed purpose and benefits with wife Provided education to patient re: private pay aide services and respite resources Reviewed medications with patient and discussed affordability and adherence Collaborated with [employer] regarding need for new wheelchair Discussed plans with patient for ongoing care management follow up and provided patient with direct contact information for care management team         SDOH assessments and interventions completed:  No     Care Coordination Interventions:  Yes, provided   Interventions Today    Flowsheet Row Most Recent Value  Chronic Disease   Chronic disease during today's visit Other  [dementia]  General Interventions   General Interventions Discussed/Reviewed Communication with, General Interventions Reviewed  Durable Medical Equipment (DME) Wheelchair  Wheelchair Standard  Communication with --  [Discussed order for wheelchair with [employer], notified that patient will have to pay copay of 114.46 prior to delivery.  Monthly cost will be 7.21/month.  Wife aware, provided with contact information to make payment]       Follow up plan: Follow up call scheduled for 7/8    Encounter Outcome:  Pt. Visit Completed   Kemper Durie, RN, MSN, [facility] Care Management Care Management  Coordinator [phone]

## 2022-09-25 ENCOUNTER — Ambulatory Visit: Payer: Self-pay | Admitting: *Deleted

## 2022-09-25 NOTE — Patient Outreach (Signed)
  Care Coordination   09/25/2022 Name: Jeff Key MRN: [id number] DOB: 1946-03-18   Care Coordination Outreach Attempts:  An unsuccessful telephone outreach was attempted for a scheduled appointment today.  Follow Up Plan:  Additional outreach attempts will be made to offer the patient care coordination information and services.   Encounter Outcome:  No Answer   Care Coordination Interventions:  No, not indicated    Kemper Durie, RN, MSN, [facility] Care Management Care Management Coordinator [phone]

## 2022-09-25 NOTE — Patient Outreach (Signed)
  Care Coordination   Follow Up Visit Note   09/25/2022 Name: Jeff Key MRN: [id number] DOB: October 03, 1945  Jeff Key is a 77 y.o. year old male who sees Jeff Reichmann, MD for primary care. I spoke with  Jeff Key, wife of Jeff Key by phone today.  What matters to the patients health and wellness today?  Continue management of dementia with help of family    Goals Addressed             This Visit's Progress    Effective management of Alzheimer's   On track    Care Coordination Interventions: Evaluation of current treatment plan related to Alzheimer's and patient's adherence to plan as established by provider Advised patient to consider palliative care.  Discussed purpose and benefits with wife Provided education to patient re: private pay aide services and respite resources Reviewed medications with patient and discussed affordability and adherence Discussed plans with patient for ongoing care management follow up and provided patient with direct contact information for care management team         SDOH assessments and interventions completed:  No     Care Coordination Interventions:  Yes, provided   Interventions Today    Flowsheet Row Most Recent Value  Chronic Disease   Chronic disease during today's visit Other  General Interventions   General Interventions Discussed/Reviewed General Interventions Reviewed, Doctor Visits, Durable Medical Equipment (DME)  Doctor Visits Discussed/Reviewed Doctor Visits Reviewed, PCP  [Discussed having virtual visits with PCP in the last year or so, wife aware that he may need face to face in the future for insurance and medication purposes, she verbalized understanding]  Horticulturist, commercial (DME) Wheelchair  Wheelchair Standard  [Wife report she still does not have new wheelchair, but is working with DME company to find a size that will fit in the doorways and fit patient.  He has one now that was given to him,  but will need a new one in the future.]  Education Interventions   Education Provided Provided Education  Provided Verbal Education On Other, Medication, When to see the doctor       Follow up plan: Follow up call scheduled for 8/5    Encounter Outcome:  Pt. Visit Completed   Kemper Durie, RN, MSN, [facility] Care Management Care Management Coordinator [phone]

## 2022-09-26 DIAGNOSIS — K5130 Ulcerative (chronic) rectosigmoiditis without complications: Secondary | ICD-10-CM | POA: Diagnosis not present

## 2022-10-16 ENCOUNTER — Other Ambulatory Visit: Payer: Self-pay | Admitting: Cardiovascular Disease

## 2022-10-20 ENCOUNTER — Other Ambulatory Visit: Payer: Self-pay | Admitting: Cardiovascular Disease

## 2022-10-22 ENCOUNTER — Other Ambulatory Visit: Payer: Self-pay | Admitting: Cardiovascular Disease

## 2022-10-23 ENCOUNTER — Ambulatory Visit: Payer: Self-pay | Admitting: *Deleted

## 2022-10-23 NOTE — Patient Outreach (Signed)
  Care Coordination   10/23/2022 Name: MARKES BAUGHN MRN: [id number] DOB: Dec 13, 1945   Care Coordination Outreach Attempts:  An unsuccessful telephone outreach was attempted for a scheduled appointment today.  Follow Up Plan:  Additional outreach attempts will be made to offer the patient care coordination information and services.   Encounter Outcome:  No Answer   Care Coordination Interventions:  No, not indicated    Kemper Durie, RN, MSN, [facility] Care Management Care Management Coordinator [phone]'

## 2022-10-26 ENCOUNTER — Telehealth: Payer: Self-pay | Admitting: *Deleted

## 2022-10-26 NOTE — Progress Notes (Signed)
  Care Coordination Note  10/26/2022 Name: Jeff Key MRN: [id number] DOB: 10-16-1945  Jeff Key is a 77 y.o. year old male who is a primary care patient of Hande, Roderic Palau, MD and is actively engaged with the care management team. I reached out to Lillia Dallas by phone today to assist with re-scheduling a follow up visit with the RN Case Manager  Follow up plan: Unsuccessful telephone outreach attempt made. A HIPAA compliant phone message was left for the patient providing contact information and requesting a return call.   Burman Nieves, CCMA Care Coordination Care Guide Direct Dial: [phone]

## 2022-11-17 ENCOUNTER — Other Ambulatory Visit: Payer: Self-pay | Admitting: Cardiovascular Disease

## 2022-11-19 ENCOUNTER — Other Ambulatory Visit: Payer: Self-pay | Admitting: Cardiovascular Disease

## 2022-11-21 ENCOUNTER — Telehealth: Payer: Self-pay | Admitting: Cardiovascular Disease

## 2022-11-21 MED ORDER — ATORVASTATIN CALCIUM 20 MG PO TABS: 20.0000 mg | ORAL_TABLET | Freq: Every day | ORAL | 3 refills | Status: DC

## 2022-11-21 MED ORDER — LISINOPRIL 10 MG PO TABS: 10.0000 mg | ORAL_TABLET | Freq: Every day | ORAL | 3 refills | Status: DC

## 2022-11-21 MED ORDER — CLOPIDOGREL BISULFATE 75 MG PO TABS: 75.0000 mg | ORAL_TABLET | Freq: Every day | ORAL | 3 refills | Status: DC

## 2022-11-21 NOTE — Telephone Encounter (Signed)
Call to patient wife with no answer. VM not accepting messages.  Could patient be set for virtual appt with you or another provider in order to get Plavix? Or can it be given without an appt. ?

## 2022-11-21 NOTE — Telephone Encounter (Signed)
Pt c/o medication issue:  1. Name of Medication:  clopidogrel (PLAVIX) 75 MG tablet  2. How are you currently taking this medication (dosage and times per day)?   3. Are you having a reaction (difficulty breathing--STAT)?   4. What is your medication issue?   Patient's wife states the pharmacy only sent in 15 tablets. She states patient is bedridden and unable to transport to the office for an appointment + Dr. Allyson Sabal advised to only follow up if needed. Patient's wife would like to know if Dr. Allyson Sabal will continue to refill Clopidogrel. Please advise.

## 2022-11-21 NOTE — Telephone Encounter (Signed)
Attempted to call wife back. Ok per Dr. Allyson Sabal to refill all cardiac medications. Medications have been sent to [facility] in [place].   Left message on mobile number stating that prescriptions for all cardiac medications have been sent in per Dr. Allyson Sabal.

## 2022-12-01 NOTE — Progress Notes (Signed)
Care Coordination Note  12/01/2022 Name: [name] MRN: [id number] DOB: 03/05/1946  Jeff Key is a 77 y.o. year old male who is a primary care patient of [name], MD and is actively engaged with the care management team. I reached out to Lillia Dallas by phone today to assist with re-scheduling a follow up visit with the RN Case Manager  Follow up plan: We have been unable to make contact with the patient for follow up.   Burman Nieves, CCMA Care Coordination Care Guide Direct Dial: [phone]

## 2022-12-21 DIAGNOSIS — F0280 Dementia in other diseases classified elsewhere without behavioral disturbance: Secondary | ICD-10-CM | POA: Diagnosis not present

## 2022-12-21 DIAGNOSIS — G309 Alzheimer's disease, unspecified: Secondary | ICD-10-CM | POA: Diagnosis not present

## 2022-12-21 DIAGNOSIS — F0150 Vascular dementia without behavioral disturbance: Secondary | ICD-10-CM | POA: Diagnosis not present

## 2023-06-28 ENCOUNTER — Other Ambulatory Visit: Payer: Self-pay

## 2023-06-28 ENCOUNTER — Emergency Department

## 2023-06-28 ENCOUNTER — Emergency Department
Admission: EM | Admit: 2023-06-28 | Discharge: 2023-06-29 | Disposition: A | Discharge status: Home / Self Care | Attending: Emergency Medicine | Admitting: Emergency Medicine

## 2023-06-28 DIAGNOSIS — K429 Umbilical hernia without obstruction or gangrene: Secondary | ICD-10-CM | POA: Diagnosis not present

## 2023-06-28 DIAGNOSIS — R1111 Vomiting without nausea: Secondary | ICD-10-CM | POA: Diagnosis not present

## 2023-06-28 DIAGNOSIS — R197 Diarrhea, unspecified: Secondary | ICD-10-CM | POA: Diagnosis not present

## 2023-06-28 DIAGNOSIS — R509 Fever, unspecified: Secondary | ICD-10-CM | POA: Insufficient documentation

## 2023-06-28 DIAGNOSIS — I2510 Atherosclerotic heart disease of native coronary artery without angina pectoris: Secondary | ICD-10-CM | POA: Diagnosis not present

## 2023-06-28 DIAGNOSIS — N200 Calculus of kidney: Secondary | ICD-10-CM | POA: Diagnosis not present

## 2023-06-28 DIAGNOSIS — R1110 Vomiting, unspecified: Secondary | ICD-10-CM

## 2023-06-28 DIAGNOSIS — I517 Cardiomegaly: Secondary | ICD-10-CM | POA: Diagnosis not present

## 2023-06-28 DIAGNOSIS — I700 Atherosclerosis of aorta: Secondary | ICD-10-CM | POA: Diagnosis not present

## 2023-06-28 DIAGNOSIS — K449 Diaphragmatic hernia without obstruction or gangrene: Secondary | ICD-10-CM | POA: Diagnosis not present

## 2023-06-28 DIAGNOSIS — R5383 Other fatigue: Secondary | ICD-10-CM

## 2023-06-28 DIAGNOSIS — R0989 Other specified symptoms and signs involving the circulatory and respiratory systems: Secondary | ICD-10-CM | POA: Diagnosis not present

## 2023-06-28 DIAGNOSIS — F02C0 Dementia in other diseases classified elsewhere, severe, without behavioral disturbance, psychotic disturbance, mood disturbance, and anxiety: Secondary | ICD-10-CM | POA: Diagnosis not present

## 2023-06-28 DIAGNOSIS — R531 Weakness: Secondary | ICD-10-CM | POA: Diagnosis not present

## 2023-06-28 DIAGNOSIS — K8020 Calculus of gallbladder without cholecystitis without obstruction: Secondary | ICD-10-CM | POA: Diagnosis not present

## 2023-06-28 DIAGNOSIS — G309 Alzheimer's disease, unspecified: Secondary | ICD-10-CM | POA: Diagnosis not present

## 2023-06-28 DIAGNOSIS — R112 Nausea with vomiting, unspecified: Secondary | ICD-10-CM | POA: Insufficient documentation

## 2023-06-28 DIAGNOSIS — R000 Tachycardia, unspecified: Secondary | ICD-10-CM | POA: Diagnosis not present

## 2023-06-28 LAB — COMPREHENSIVE METABOLIC PANEL WITH GFR
ALT: 26 U/L (ref 0–44)
AST: 35 U/L (ref 15–41)
Albumin: 3.8 g/dL (ref 3.5–5.0)
Alkaline Phosphatase: 92 U/L (ref 38–126)
Anion gap: 11 (ref 5–15)
BUN: 14 mg/dL (ref 8–23)
CO2: 22 mmol/L (ref 22–32)
Calcium: 8.6 mg/dL — ABNORMAL LOW (ref 8.9–10.3)
Chloride: 97 mmol/L — ABNORMAL LOW (ref 98–111)
Creatinine, Ser: 0.82 mg/dL (ref 0.61–1.24)
GFR, Estimated: >60 mL/min (ref >60)
Glucose, Bld: 166 mg/dL — ABNORMAL HIGH (ref 70–99)
Potassium: 4.2 mmol/L (ref 3.5–5.1)
Sodium: 130 mmol/L — ABNORMAL LOW (ref 135–145)
Total Bilirubin: 0.7 mg/dL (ref 0.0–1.2)
Total Protein: 7.9 g/dL (ref 6.5–8.1)

## 2023-06-28 LAB — PROTIME-INR
INR: 1 (ref 0.8–1.2)
Prothrombin Time: 13 s (ref 11.4–15.2)

## 2023-06-28 LAB — CBC WITH DIFFERENTIAL/PLATELET
Abs Immature Granulocytes: 0.1 10*3/uL — ABNORMAL HIGH (ref 0.00–0.07)
Basophils Absolute: 0.1 10*3/uL (ref 0.0–0.1)
Basophils Relative: 0 %
Eosinophils Absolute: 0.5 10*3/uL (ref 0.0–0.5)
Eosinophils Relative: 3 %
HCT: 41.7 % (ref 39.0–52.0)
Hemoglobin: 13.6 g/dL (ref 13.0–17.0)
Immature Granulocytes: 1 %
Lymphocytes Relative: 5 %
Lymphs Abs: 0.7 10*3/uL (ref 0.7–4.0)
MCH: 29.2 pg (ref 26.0–34.0)
MCHC: 32.6 g/dL (ref 30.0–36.0)
MCV: 89.5 fL (ref 80.0–100.0)
Monocytes Absolute: 0.5 10*3/uL (ref 0.1–1.0)
Monocytes Relative: 4 %
Neutro Abs: 12.4 10*3/uL — ABNORMAL HIGH (ref 1.7–7.7)
Neutrophils Relative %: 87 %
Platelets: 329 10*3/uL (ref 150–400)
RBC: 4.66 MIL/uL (ref 4.22–5.81)
RDW: 12.9 % (ref 11.5–15.5)
WBC: 14.4 10*3/uL — ABNORMAL HIGH (ref 4.0–10.5)
nRBC: 0 % (ref 0.0–0.2)

## 2023-06-28 LAB — RESP PANEL BY RT-PCR (RSV, FLU A&B, COVID)  RVPGX2
Influenza A by PCR: NEGATIVE
Influenza B by PCR: NEGATIVE
Resp Syncytial Virus by PCR: NEGATIVE
SARS Coronavirus 2 by RT PCR: NEGATIVE

## 2023-06-28 LAB — LACTIC ACID, PLASMA: Lactic Acid, Venous: 2.7 mmol/L (ref 0.5–1.9)

## 2023-06-28 MED ORDER — SODIUM CHLORIDE 0.9 % IV BOLUS (SEPSIS)
1000.0000 mL | Freq: Once | INTRAVENOUS | Status: AC
  Administered 2023-06-28: 1000 mL via INTRAVENOUS

## 2023-06-28 NOTE — ED Provider Notes (Signed)
 [facility] Provider Note    Event Date/Time   First MD Initiated Contact with Patient 06/28/23 2034     (approximate)   History   Chief Complaint: Code Sepsis   HPI  Jeff Key is a 78 y.o. male with a history of dementia, CAD, GERD who was brought to the ED due to decreased energy level today.  Patient is nonverbal at baseline, not able to provide any history.  EMS also report some nausea vomiting diarrhea at home.  EMS does report being told patient has frequent UTIs.  EMS reports axillary temp of 100.1.          Physical Exam   Triage Vital Signs: ED Triage Vitals [06/28/23 2040]  Encounter Vitals Group     BP      Systolic BP Percentile      Diastolic BP Percentile      Pulse      Resp      Temp      Temp src      SpO2      Weight 244 lb 11.4 oz (111 kg)     Height 5\' 10"  (1.778 m)     Head Circumference      Peak Flow      Pain Score      Pain Loc      Pain Education      Exclude from Growth Chart     Most recent vital signs: Vitals:   06/28/23 2052 06/28/23 2057  BP: 122/76   Pulse: (!) 115   Resp: 19   Temp:  99.5 F (37.5 C)  SpO2: 96%     General: Awake, no distress.  CV:  Good peripheral perfusion.  Tachycardia heart rate 115 Resp:  Normal effort.  Clear to auscultation bilaterally Abd:  No distention.  Soft nontender Other:  No soft tissue inflammatory changes   ED Results / Procedures / Treatments   Labs (all labs ordered are listed, but only abnormal results are displayed) Labs Reviewed  LACTIC ACID, PLASMA - Abnormal; Notable for the following components:      Result Value   Lactic Acid, Venous 2.7 (*)    All other components within normal limits  COMPREHENSIVE METABOLIC PANEL WITH GFR - Abnormal; Notable for the following components:   Sodium 130 (*)    Chloride 97 (*)    Glucose, Bld 166 (*)    Calcium 8.6 (*)    All other components within normal limits  CBC WITH DIFFERENTIAL/PLATELET -  Abnormal; Notable for the following components:   WBC 14.4 (*)    Neutro Abs 12.4 (*)    Abs Immature Granulocytes 0.10 (*)    All other components within normal limits  RESP PANEL BY RT-PCR (RSV, FLU A&B, COVID)  RVPGX2  CULTURE, BLOOD (ROUTINE X 2)  CULTURE, BLOOD (ROUTINE X 2)  PROTIME-INR  LACTIC ACID, PLASMA  URINALYSIS, W/ REFLEX TO CULTURE (INFECTION SUSPECTED)     EKG Interpreted by me sinus tachycardia rate 110.  Mildly prolonged QTc of 512 ms.  No acute ischemic changes.   RADIOLOGY Chest x-ray interpreted by me.  No pneumothorax.  Left lower lung obscured by heart and soft tissue, appears similar to previous.   PROCEDURES:  Procedures   MEDICATIONS ORDERED IN ED: Medications  sodium chloride 0.9 % bolus 1,000 mL (0 mLs Intravenous Stopped 06/28/23 2220)     IMPRESSION / MDM / ASSESSMENT AND PLAN / ED COURSE  I  reviewed the triage vital signs and the nursing notes.  DDx: Dehydration, UTI, pneumonia, COVID, influenza, RSV, AKI, electrolyte derangement  Patient's presentation is most consistent with acute presentation with potential threat to life or bodily function.  Patient brought to the ED due to decreased energy level today.  He is tachycardic, had borderline elevated body temperature with EMS.  Will pursue sepsis workup, no signs of shock presently.   Clinical Course as of 06/28/23 2313  Thu Jun 28, 2023  2311 Care discussed with the patient's wife at bedside who feels that she does have adequate support at home and would be comfortable with managing at home if it is safe to do so.  Will update once rest of the results are available.  Care signed out to Dr. Rosalia Hammers. [PS]    Clinical Course User Index [PS] Sharman Cheek, MD     FINAL CLINICAL IMPRESSION(S) / ED DIAGNOSES   Final diagnoses:  Fatigue, unspecified type  Severe Alzheimer's dementia without behavioral disturbance, psychotic disturbance, mood disturbance, or anxiety, unspecified timing  of dementia onset (HCC)     Rx / DC Orders   ED Discharge Orders     None        Note:  This document was prepared using Dragon voice recognition software and may include unintentional dictation errors.   Sharman Cheek, MD 06/28/23 2314

## 2023-06-28 NOTE — ED Triage Notes (Signed)
 Pt presents via POV c/o increased lethargy. Hx of Dementia. N/V/D. Non-verbal and non-ambulatory at baseline.

## 2023-06-28 NOTE — Discharge Instructions (Addendum)
 You were seen in the ER for your weakness.  Your testing fortunately did not show an emergency cause for this.  There are some prescription for nausea medicine to your pharmacy that you can take as needed.  Follow-up closely with your primary care doctor for further evaluation.  Return to the ER for any new or worsening symptoms.

## 2023-06-29 ENCOUNTER — Emergency Department

## 2023-06-29 DIAGNOSIS — K429 Umbilical hernia without obstruction or gangrene: Secondary | ICD-10-CM | POA: Diagnosis not present

## 2023-06-29 DIAGNOSIS — F03918 Unspecified dementia, unspecified severity, with other behavioral disturbance: Secondary | ICD-10-CM | POA: Diagnosis not present

## 2023-06-29 DIAGNOSIS — N200 Calculus of kidney: Secondary | ICD-10-CM | POA: Diagnosis not present

## 2023-06-29 DIAGNOSIS — K449 Diaphragmatic hernia without obstruction or gangrene: Secondary | ICD-10-CM | POA: Diagnosis not present

## 2023-06-29 DIAGNOSIS — K8020 Calculus of gallbladder without cholecystitis without obstruction: Secondary | ICD-10-CM | POA: Diagnosis not present

## 2023-06-29 DIAGNOSIS — R509 Fever, unspecified: Secondary | ICD-10-CM | POA: Diagnosis not present

## 2023-06-29 DIAGNOSIS — R404 Transient alteration of awareness: Secondary | ICD-10-CM | POA: Diagnosis not present

## 2023-06-29 LAB — URINALYSIS, W/ REFLEX TO CULTURE (INFECTION SUSPECTED)
Bacteria, UA: NONE SEEN
Bilirubin Urine: NEGATIVE
Glucose, UA: NEGATIVE mg/dL
Ketones, ur: NEGATIVE mg/dL
Leukocytes,Ua: NEGATIVE
Nitrite: NEGATIVE
Protein, ur: NEGATIVE mg/dL
Specific Gravity, Urine: 1.021 (ref 1.005–1.030)
Squamous Epithelial / HPF: 0 /HPF (ref 0–5)
pH: 5 (ref 5.0–8.0)

## 2023-06-29 LAB — LACTIC ACID, PLASMA: Lactic Acid, Venous: 1.7 mmol/L (ref 0.5–1.9)

## 2023-06-29 MED ORDER — ONDANSETRON HCL 4 MG PO TABS: 4.0000 mg | ORAL_TABLET | Freq: Four times a day (QID) | ORAL | 0 refills | Status: DC | PRN

## 2023-06-29 MED ORDER — IOHEXOL 350 MG/ML SOLN
100.0000 mL | Freq: Once | INTRAVENOUS | Status: AC | PRN
  Administered 2023-06-29: 100 mL via INTRAVENOUS

## 2023-06-29 MED ORDER — ONDANSETRON HCL 4 MG/2ML IJ SOLN
4.0000 mg | Freq: Once | INTRAMUSCULAR | Status: AC
  Administered 2023-06-29: 4 mg via INTRAVENOUS
  Filled 2023-06-29: qty 2

## 2023-06-29 MED ORDER — SODIUM CHLORIDE 0.9 % IV BOLUS
1000.0000 mL | Freq: Once | INTRAVENOUS | Status: AC
  Administered 2023-06-29: 1000 mL via INTRAVENOUS

## 2023-06-29 NOTE — ED Provider Notes (Signed)
 Care of this patient assumed from prior physician at 2300 pending repeat lactate, urinalysis, chest x-Jeff Key, reevaluation, and disposition.  If testing reassuring, felt that patient likely could be discharged.  Please see prior physician note for further details.  Briefly this is a 78 year old male who presented with increased lethargy.  Borderline febrile with EMS.  Reported recent vomiting and diarrhea.  Tachycardic on presentation here.  Labs with leukocytosis.  Initial lactate elevated at 2.7.  Signed out to me pending repeat lactate, urine, chest x-Jeff Key.  Repeat lactate cleared to 1.7.  Urine without evidence of infection.  X-Jeff Key without signs of pneumonia.  Patient reassessed, remains tachycardic.  Will obtain CT abdomen pelvis to further evaluate for possible infectious sources.  3:45 AM CT abdomen pelvis without acute findings.  Patient was able to tolerate a p.o. challenge without recurrent vomiting.  Patient without obvious infectious source, suspect possible viral GI illness given vomiting and diarrhea with reassuring CT.  I did discuss and offer admission, however as patient feels improved and is tolerating p.o., family was comfortable with discharge home.  Do think this is reasonable.  Strict return precautions provided.  Will DC with prescription for Zofran.  Patient discharged in stable condition.   Jeff Post, MD 06/29/23 [phone]

## 2023-07-03 LAB — CULTURE, BLOOD (ROUTINE X 2)
Culture: NO GROWTH
Culture: NO GROWTH
Special Requests: ADEQUATE
Special Requests: ADEQUATE

## 2023-07-05 ENCOUNTER — Emergency Department

## 2023-07-05 ENCOUNTER — Inpatient Hospital Stay: Admitting: Radiology

## 2023-07-05 ENCOUNTER — Encounter: Payer: Self-pay | Admitting: Internal Medicine

## 2023-07-05 ENCOUNTER — Other Ambulatory Visit: Payer: Self-pay

## 2023-07-05 ENCOUNTER — Inpatient Hospital Stay (HOSPITAL_COMMUNITY)
Admit: 2023-07-05 | Discharge: 2023-07-05 | Disposition: A | Discharge status: Home / Self Care | Attending: Internal Medicine | Admitting: Internal Medicine

## 2023-07-05 ENCOUNTER — Inpatient Hospital Stay
Admission: EM | Admit: 2023-07-05 | Discharge: 2023-07-12 | DRG: 871 | Disposition: A | Discharge status: Home Health | Attending: Obstetrics and Gynecology | Admitting: Obstetrics and Gynecology

## 2023-07-05 DIAGNOSIS — G309 Alzheimer's disease, unspecified: Secondary | ICD-10-CM | POA: Diagnosis present

## 2023-07-05 DIAGNOSIS — A419 Sepsis, unspecified organism: Secondary | ICD-10-CM | POA: Diagnosis not present

## 2023-07-05 DIAGNOSIS — Z6834 Body mass index (BMI) 34.0-34.9, adult: Secondary | ICD-10-CM

## 2023-07-05 DIAGNOSIS — E876 Hypokalemia: Secondary | ICD-10-CM | POA: Diagnosis not present

## 2023-07-05 DIAGNOSIS — Z8711 Personal history of peptic ulcer disease: Secondary | ICD-10-CM

## 2023-07-05 DIAGNOSIS — I252 Old myocardial infarction: Secondary | ICD-10-CM

## 2023-07-05 DIAGNOSIS — R339 Retention of urine, unspecified: Secondary | ICD-10-CM | POA: Diagnosis not present

## 2023-07-05 DIAGNOSIS — K651 Peritoneal abscess: Secondary | ICD-10-CM

## 2023-07-05 DIAGNOSIS — I77810 Thoracic aortic ectasia: Secondary | ICD-10-CM | POA: Diagnosis not present

## 2023-07-05 DIAGNOSIS — R6520 Severe sepsis without septic shock: Secondary | ICD-10-CM | POA: Diagnosis present

## 2023-07-05 DIAGNOSIS — I110 Hypertensive heart disease with heart failure: Secondary | ICD-10-CM | POA: Diagnosis not present

## 2023-07-05 DIAGNOSIS — E669 Obesity, unspecified: Secondary | ICD-10-CM | POA: Diagnosis present

## 2023-07-05 DIAGNOSIS — R404 Transient alteration of awareness: Secondary | ICD-10-CM | POA: Diagnosis not present

## 2023-07-05 DIAGNOSIS — I5022 Chronic systolic (congestive) heart failure: Secondary | ICD-10-CM | POA: Diagnosis present

## 2023-07-05 DIAGNOSIS — K810 Acute cholecystitis: Secondary | ICD-10-CM | POA: Diagnosis not present

## 2023-07-05 DIAGNOSIS — J449 Chronic obstructive pulmonary disease, unspecified: Secondary | ICD-10-CM | POA: Diagnosis not present

## 2023-07-05 DIAGNOSIS — Z7982 Long term (current) use of aspirin: Secondary | ICD-10-CM

## 2023-07-05 DIAGNOSIS — I959 Hypotension, unspecified: Secondary | ICD-10-CM | POA: Diagnosis not present

## 2023-07-05 DIAGNOSIS — K5130 Ulcerative (chronic) rectosigmoiditis without complications: Secondary | ICD-10-CM | POA: Diagnosis not present

## 2023-07-05 DIAGNOSIS — E119 Type 2 diabetes mellitus without complications: Secondary | ICD-10-CM | POA: Diagnosis present

## 2023-07-05 DIAGNOSIS — Z7984 Long term (current) use of oral hypoglycemic drugs: Secondary | ICD-10-CM

## 2023-07-05 DIAGNOSIS — K8000 Calculus of gallbladder with acute cholecystitis without obstruction: Secondary | ICD-10-CM | POA: Diagnosis present

## 2023-07-05 DIAGNOSIS — K8020 Calculus of gallbladder without cholecystitis without obstruction: Secondary | ICD-10-CM | POA: Diagnosis not present

## 2023-07-05 DIAGNOSIS — B9620 Unspecified Escherichia coli [E. coli] as the cause of diseases classified elsewhere: Secondary | ICD-10-CM | POA: Diagnosis present

## 2023-07-05 DIAGNOSIS — D649 Anemia, unspecified: Secondary | ICD-10-CM | POA: Diagnosis not present

## 2023-07-05 DIAGNOSIS — F0280 Dementia in other diseases classified elsewhere without behavioral disturbance: Secondary | ICD-10-CM | POA: Diagnosis present

## 2023-07-05 DIAGNOSIS — K5229 Other allergic and dietetic gastroenteritis and colitis: Secondary | ICD-10-CM | POA: Diagnosis not present

## 2023-07-05 DIAGNOSIS — I517 Cardiomegaly: Secondary | ICD-10-CM | POA: Diagnosis not present

## 2023-07-05 DIAGNOSIS — E872 Acidosis, unspecified: Secondary | ICD-10-CM | POA: Diagnosis not present

## 2023-07-05 DIAGNOSIS — K219 Gastro-esophageal reflux disease without esophagitis: Secondary | ICD-10-CM | POA: Diagnosis present

## 2023-07-05 DIAGNOSIS — Z87891 Personal history of nicotine dependence: Secondary | ICD-10-CM

## 2023-07-05 DIAGNOSIS — Z1623 Resistance to quinolones and fluoroquinolones: Secondary | ICD-10-CM | POA: Diagnosis not present

## 2023-07-05 DIAGNOSIS — R112 Nausea with vomiting, unspecified: Secondary | ICD-10-CM | POA: Diagnosis not present

## 2023-07-05 DIAGNOSIS — K760 Fatty (change of) liver, not elsewhere classified: Secondary | ICD-10-CM | POA: Diagnosis not present

## 2023-07-05 DIAGNOSIS — E785 Hyperlipidemia, unspecified: Secondary | ICD-10-CM | POA: Diagnosis not present

## 2023-07-05 DIAGNOSIS — R000 Tachycardia, unspecified: Secondary | ICD-10-CM | POA: Diagnosis not present

## 2023-07-05 DIAGNOSIS — Z0389 Encounter for observation for other suspected diseases and conditions ruled out: Secondary | ICD-10-CM | POA: Diagnosis not present

## 2023-07-05 DIAGNOSIS — K828 Other specified diseases of gallbladder: Secondary | ICD-10-CM | POA: Diagnosis not present

## 2023-07-05 DIAGNOSIS — Z794 Long term (current) use of insulin: Secondary | ICD-10-CM

## 2023-07-05 DIAGNOSIS — K5730 Diverticulosis of large intestine without perforation or abscess without bleeding: Secondary | ICD-10-CM | POA: Diagnosis not present

## 2023-07-05 DIAGNOSIS — K5900 Constipation, unspecified: Secondary | ICD-10-CM | POA: Diagnosis not present

## 2023-07-05 DIAGNOSIS — I255 Ischemic cardiomyopathy: Secondary | ICD-10-CM | POA: Diagnosis not present

## 2023-07-05 DIAGNOSIS — Z1624 Resistance to multiple antibiotics: Secondary | ICD-10-CM | POA: Diagnosis not present

## 2023-07-05 DIAGNOSIS — I509 Heart failure, unspecified: Secondary | ICD-10-CM | POA: Diagnosis not present

## 2023-07-05 DIAGNOSIS — Z7401 Bed confinement status: Secondary | ICD-10-CM | POA: Diagnosis not present

## 2023-07-05 DIAGNOSIS — N179 Acute kidney failure, unspecified: Secondary | ICD-10-CM | POA: Diagnosis present

## 2023-07-05 DIAGNOSIS — I2510 Atherosclerotic heart disease of native coronary artery without angina pectoris: Secondary | ICD-10-CM | POA: Diagnosis present

## 2023-07-05 DIAGNOSIS — Z955 Presence of coronary angioplasty implant and graft: Secondary | ICD-10-CM

## 2023-07-05 DIAGNOSIS — R140 Abdominal distension (gaseous): Secondary | ICD-10-CM | POA: Diagnosis not present

## 2023-07-05 DIAGNOSIS — Z7902 Long term (current) use of antithrombotics/antiplatelets: Secondary | ICD-10-CM

## 2023-07-05 DIAGNOSIS — Z79899 Other long term (current) drug therapy: Secondary | ICD-10-CM

## 2023-07-05 DIAGNOSIS — Z8042 Family history of malignant neoplasm of prostate: Secondary | ICD-10-CM

## 2023-07-05 HISTORY — PX: IR PERC CHOLECYSTOSTOMY: IMG2326

## 2023-07-05 LAB — ECHOCARDIOGRAM COMPLETE
AR max vel: 1.83 cm2
AV Area VTI: 1.83 cm2
AV Area mean vel: 1.66 cm2
AV Mean grad: 10.7 mmHg
AV Peak grad: 21.1 mmHg
Ao pk vel: 2.3 m/s
Area-P 1/2: 2.34 cm2
Calc EF: 43.2 %
Height: 70 in
MV VTI: 1.87 cm2
S' Lateral: 3.6 cm
Single Plane A2C EF: 47.8 %
Single Plane A4C EF: 38.2 %
Weight: 3840 [oz_av]

## 2023-07-05 LAB — CBC WITH DIFFERENTIAL/PLATELET
Abs Immature Granulocytes: 0.85 10*3/uL — ABNORMAL HIGH (ref 0.00–0.07)
Basophils Absolute: 0.1 10*3/uL (ref 0.0–0.1)
Basophils Relative: 0 %
Eosinophils Absolute: 0 10*3/uL (ref 0.0–0.5)
Eosinophils Relative: 0 %
HCT: 36.7 % — ABNORMAL LOW (ref 39.0–52.0)
Hemoglobin: 11.9 g/dL — ABNORMAL LOW (ref 13.0–17.0)
Immature Granulocytes: 3 %
Lymphocytes Relative: 7 %
Lymphs Abs: 1.9 10*3/uL (ref 0.7–4.0)
MCH: 29 pg (ref 26.0–34.0)
MCHC: 32.4 g/dL (ref 30.0–36.0)
MCV: 89.3 fL (ref 80.0–100.0)
Monocytes Absolute: 1.8 10*3/uL — ABNORMAL HIGH (ref 0.1–1.0)
Monocytes Relative: 7 %
Neutro Abs: 22.5 10*3/uL — ABNORMAL HIGH (ref 1.7–7.7)
Neutrophils Relative %: 83 %
Platelets: 346 10*3/uL (ref 150–400)
RBC: 4.11 MIL/uL — ABNORMAL LOW (ref 4.22–5.81)
RDW: 13.3 % (ref 11.5–15.5)
Smear Review: NORMAL
WBC: 27.1 10*3/uL — ABNORMAL HIGH (ref 4.0–10.5)
nRBC: 0 % (ref 0.0–0.2)

## 2023-07-05 LAB — CBG MONITORING, ED
Glucose-Capillary: 148 mg/dL — ABNORMAL HIGH (ref 70–99)
Glucose-Capillary: 150 mg/dL — ABNORMAL HIGH (ref 70–99)

## 2023-07-05 LAB — COMPREHENSIVE METABOLIC PANEL WITH GFR
ALT: 15 U/L (ref 0–44)
AST: 23 U/L (ref 15–41)
Albumin: 2.9 g/dL — ABNORMAL LOW (ref 3.5–5.0)
Alkaline Phosphatase: 72 U/L (ref 38–126)
Anion gap: 14 (ref 5–15)
BUN: 20 mg/dL (ref 8–23)
CO2: 22 mmol/L (ref 22–32)
Calcium: 8.4 mg/dL — ABNORMAL LOW (ref 8.9–10.3)
Chloride: 96 mmol/L — ABNORMAL LOW (ref 98–111)
Creatinine, Ser: 1.78 mg/dL — ABNORMAL HIGH (ref 0.61–1.24)
GFR, Estimated: 39 mL/min — ABNORMAL LOW (ref >60)
Glucose, Bld: 215 mg/dL — ABNORMAL HIGH (ref 70–99)
Potassium: 3.9 mmol/L (ref 3.5–5.1)
Sodium: 132 mmol/L — ABNORMAL LOW (ref 135–145)
Total Bilirubin: 1.1 mg/dL (ref 0.0–1.2)
Total Protein: 7.3 g/dL (ref 6.5–8.1)

## 2023-07-05 LAB — URINALYSIS, W/ REFLEX TO CULTURE (INFECTION SUSPECTED)
Glucose, UA: 50 mg/dL — AB
Ketones, ur: NEGATIVE mg/dL
Leukocytes,Ua: NEGATIVE
Nitrite: NEGATIVE
Protein, ur: 100 mg/dL — AB
Specific Gravity, Urine: 1.029 (ref 1.005–1.030)
Squamous Epithelial / HPF: 0 /HPF (ref 0–5)
pH: 5 (ref 5.0–8.0)

## 2023-07-05 LAB — TROPONIN I (HIGH SENSITIVITY): Troponin I (High Sensitivity): 18 ng/L — ABNORMAL HIGH (ref <18)

## 2023-07-05 LAB — PROTIME-INR
INR: 1.2 (ref 0.8–1.2)
Prothrombin Time: 15.8 s — ABNORMAL HIGH (ref 11.4–15.2)

## 2023-07-05 LAB — LACTIC ACID, PLASMA
Lactic Acid, Venous: 3.5 mmol/L (ref 0.5–1.9)
Lactic Acid, Venous: 2.3 mmol/L (ref 0.5–1.9)

## 2023-07-05 LAB — GLUCOSE, CAPILLARY: Glucose-Capillary: 140 mg/dL — ABNORMAL HIGH (ref 70–99)

## 2023-07-05 LAB — HEMOGLOBIN A1C
Hgb A1c MFr Bld: 7.2 % — ABNORMAL HIGH (ref 4.8–5.6)
Mean Plasma Glucose: 159.94 mg/dL

## 2023-07-05 LAB — RESP PANEL BY RT-PCR (RSV, FLU A&B, COVID)  RVPGX2
Influenza A by PCR: NEGATIVE
Influenza B by PCR: NEGATIVE
Resp Syncytial Virus by PCR: NEGATIVE
SARS Coronavirus 2 by RT PCR: NEGATIVE

## 2023-07-05 MED ORDER — SODIUM CHLORIDE 0.9% FLUSH
5.0000 mL | Freq: Three times a day (TID) | INTRAVENOUS | Status: DC
Start: 2023-07-05 — End: 2023-07-12
  Administered 2023-07-05 – 2023-07-12 (×22): 5 mL

## 2023-07-05 MED ORDER — METRONIDAZOLE 500 MG/100ML IV SOLN
500.0000 mg | Freq: Once | INTRAVENOUS | Status: AC
  Administered 2023-07-05: 500 mg via INTRAVENOUS
  Filled 2023-07-05: qty 100

## 2023-07-05 MED ORDER — SODIUM CHLORIDE 0.9 % IV SOLN
2.0000 g | INTRAVENOUS | Status: AC
  Administered 2023-07-05: 2 g via INTRAVENOUS
  Filled 2023-07-05 (×2): qty 2

## 2023-07-05 MED ORDER — HYDRALAZINE HCL 20 MG/ML IJ SOLN
5.0000 mg | Freq: Four times a day (QID) | INTRAMUSCULAR | Status: DC | PRN
  Filled 2023-07-05: qty 1

## 2023-07-05 MED ORDER — FENTANYL CITRATE (PF) 100 MCG/2ML IJ SOLN
INTRAMUSCULAR | Status: AC
  Filled 2023-07-05: qty 2

## 2023-07-05 MED ORDER — GALANTAMINE HYDROBROMIDE 4 MG PO TABS
8.0000 mg | ORAL_TABLET | Freq: Two times a day (BID) | ORAL | Status: DC
  Administered 2023-07-06 – 2023-07-12 (×13): 8 mg via ORAL
  Filled 2023-07-05 (×16): qty 2

## 2023-07-05 MED ORDER — IOHEXOL 300 MG/ML  SOLN
80.0000 mL | Freq: Once | INTRAMUSCULAR | Status: AC | PRN
  Administered 2023-07-05: 80 mL via INTRAVENOUS

## 2023-07-05 MED ORDER — ENOXAPARIN SODIUM 60 MG/0.6ML IJ SOSY
50.0000 mg | PREFILLED_SYRINGE | INTRAMUSCULAR | Status: DC
  Administered 2023-07-06 – 2023-07-12 (×7): 50 mg via SUBCUTANEOUS
  Filled 2023-07-05 (×7): qty 0.6

## 2023-07-05 MED ORDER — PANTOPRAZOLE SODIUM 40 MG IV SOLR
40.0000 mg | Freq: Once | INTRAVENOUS | Status: AC
  Administered 2023-07-05: 40 mg via INTRAVENOUS
  Filled 2023-07-05: qty 10

## 2023-07-05 MED ORDER — VANCOMYCIN HCL 2000 MG/400ML IV SOLN
2000.0000 mg | Freq: Once | INTRAVENOUS | Status: AC
  Administered 2023-07-05: 2000 mg via INTRAVENOUS
  Filled 2023-07-05: qty 400

## 2023-07-05 MED ORDER — SODIUM CHLORIDE 0.9 % IV BOLUS (SEPSIS)
1000.0000 mL | Freq: Once | INTRAVENOUS | Status: AC
  Administered 2023-07-05: 1000 mL via INTRAVENOUS

## 2023-07-05 MED ORDER — SODIUM CHLORIDE 0.9 % IV BOLUS
1000.0000 mL | Freq: Once | INTRAVENOUS | Status: AC
  Administered 2023-07-05: 1000 mL via INTRAVENOUS

## 2023-07-05 MED ORDER — MIDAZOLAM HCL 2 MG/2ML IJ SOLN
INTRAMUSCULAR | Status: AC
  Filled 2023-07-05: qty 2

## 2023-07-05 MED ORDER — LIDOCAINE HCL 1 % IJ SOLN
10.0000 mL | Freq: Once | INTRAMUSCULAR | Status: AC
  Administered 2023-07-05: 20 mL via INTRADERMAL
  Filled 2023-07-05: qty 10

## 2023-07-05 MED ORDER — SODIUM CHLORIDE 0.9 % IV SOLN
2.0000 g | Freq: Once | INTRAVENOUS | Status: AC
  Administered 2023-07-05: 2 g via INTRAVENOUS
  Filled 2023-07-05: qty 12.5

## 2023-07-05 MED ORDER — IOHEXOL 300 MG/ML  SOLN
10.0000 mL | Freq: Once | INTRAMUSCULAR | Status: AC | PRN
  Administered 2023-07-05: 5 mL

## 2023-07-05 MED ORDER — PIPERACILLIN-TAZOBACTAM 3.375 G IVPB
3.3750 g | Freq: Three times a day (TID) | INTRAVENOUS | Status: DC
  Administered 2023-07-05 – 2023-07-12 (×21): 3.375 g via INTRAVENOUS
  Filled 2023-07-05 (×23): qty 50

## 2023-07-05 MED ORDER — INSULIN ASPART 100 UNIT/ML IJ SOLN
0.0000 [IU] | Freq: Three times a day (TID) | INTRAMUSCULAR | Status: DC
  Administered 2023-07-06 – 2023-07-08 (×7): 3 [IU] via SUBCUTANEOUS
  Administered 2023-07-08 (×2): 4 [IU] via SUBCUTANEOUS
  Administered 2023-07-09: 7 [IU] via SUBCUTANEOUS
  Administered 2023-07-09 – 2023-07-12 (×10): 4 [IU] via SUBCUTANEOUS
  Filled 2023-07-05 (×18): qty 1

## 2023-07-05 MED ORDER — ATORVASTATIN CALCIUM 20 MG PO TABS
20.0000 mg | ORAL_TABLET | Freq: Every day | ORAL | Status: DC
  Administered 2023-07-07 – 2023-07-12 (×6): 20 mg via ORAL
  Filled 2023-07-05 (×6): qty 1

## 2023-07-05 MED ORDER — ASPIRIN 81 MG PO TBEC
81.0000 mg | DELAYED_RELEASE_TABLET | Freq: Every day | ORAL | Status: DC
  Administered 2023-07-07 – 2023-07-12 (×6): 81 mg via ORAL
  Filled 2023-07-05 (×6): qty 1

## 2023-07-05 MED ORDER — MEMANTINE HCL 10 MG PO TABS
10.0000 mg | ORAL_TABLET | Freq: Two times a day (BID) | ORAL | Status: DC
  Administered 2023-07-06 – 2023-07-12 (×12): 10 mg via ORAL
  Filled 2023-07-05 (×12): qty 1

## 2023-07-05 MED ORDER — MORPHINE SULFATE (PF) 4 MG/ML IV SOLN
4.0000 mg | Freq: Once | INTRAVENOUS | Status: AC
  Administered 2023-07-05: 4 mg via INTRAVENOUS
  Filled 2023-07-05: qty 1

## 2023-07-05 MED ORDER — ONDANSETRON HCL 4 MG/2ML IJ SOLN
4.0000 mg | Freq: Once | INTRAMUSCULAR | Status: DC
  Filled 2023-07-05: qty 2

## 2023-07-05 MED ORDER — FENTANYL CITRATE (PF) 100 MCG/2ML IJ SOLN
INTRAMUSCULAR | Status: AC | PRN
  Administered 2023-07-05 (×2): 25 ug via INTRAVENOUS

## 2023-07-05 MED ORDER — LIDOCAINE HCL 1 % IJ SOLN
INTRAMUSCULAR | Status: AC
  Filled 2023-07-05: qty 20

## 2023-07-05 MED ORDER — ESCITALOPRAM OXALATE 10 MG PO TABS
5.0000 mg | ORAL_TABLET | Freq: Every day | ORAL | Status: DC
  Administered 2023-07-07 – 2023-07-12 (×6): 5 mg via ORAL
  Filled 2023-07-05 (×6): qty 1

## 2023-07-05 NOTE — Procedures (Signed)
 Interventional Radiology Procedure:   Indications: Acute cholecystitis  Procedure: Percutaneous cholecystostomy tube placement  Findings: Mildly distended gallbladder.  10 Fr drain placed into gallbladder using transhepatic approach.  40 ml of pink purulent fluid aspirated from the gallbladder.  Complications: None     EBL: Minimal  Plan: Follow output.  Fluid sent for culture.   Oprah Camarena R. Julietta Ogren, MD  Pager: [phone]

## 2023-07-05 NOTE — Progress Notes (Signed)
 Pharmacy Antibiotic Note  Jeff Key is a 78 y.o. male admitted on 07/05/2023 with  intra-abdominal infection .  Per patient's family, he has been having some episodes of vomiting and says "it looks like he is in pain." Patient also has been having night sweats, high HR, and low BP at home. Pharmacy has been consulted for Zosyn dosing.  S/p cefepime 2 g IV x 1, metronidazole 500 mg IV x 1, and vancomycin 2 g IV x 1  Plan: Start Zosyn 3.375 g IV Q8H Continue to monitor renal function and follow culture results   Height: 5\' 10"  (177.8 cm) Weight: 108.9 kg (240 lb) IBW/kg (Calculated) : 73  Temp (24hrs), Avg:98.7 F (37.1 C), Min:98.7 F (37.1 C), Max:98.7 F (37.1 C)  Recent Labs  Lab 06/28/23 2057 06/29/23 0014 07/05/23 0145 07/05/23 0442  WBC 14.4*  --  27.1*  --   CREATININE 0.82  --  1.78*  --   LATICACIDVEN 2.7* 1.7 3.5* 2.3*    Estimated Creatinine Clearance: 42.3 mL/min (A) (by C-G formula based on SCr of 1.78 mg/dL (H)).    No Known Allergies  Antimicrobials this admission: 4/17 cefepime 2 g IV x 1, metronidazole 500 mg IV x 1, and vancomycin 2 g IV x 1 4/17 Zosyn >>   Microbiology results: 4/17 BCx: NG <12h 4/17 UCx: IP   Thank you for allowing pharmacy to be a part of this patient's care.  Alice Innocent, PharmD Clinical Pharmacist  07/05/2023 8:00 AM

## 2023-07-05 NOTE — ED Provider Notes (Addendum)
 [facility] Provider Note    Event Date/Time   First MD Initiated Contact with Patient 07/05/23 0123     (approximate)   History   Emesis   HPI  Jeff Key is a 78 y.o. male   Past medical history of advanced dementia nonverbal and bedbound at baseline, CAD, stomach ulcers, ischemic cardiomyopathy, ulcerative colitis, who presents to the Emergency Department with lethargy and cold sweats from home.  He had a fever last week and was evaluated in the emergency department after his wife noted that he did not appear his normal self, did not look well, but obviously could not elicit further symptomatology from patient given his nonverbal baseline.  At that time evaluation was unremarkable and he was discharged.  However since being home he has decompensated, poor p.o. intake, less energy, continued with cold sweats.  Occasionally he will wince as if he is in pain.  Independent Historian contributed to assessment above: His wife and daughter at bedside to give information above       Physical Exam   Triage Vital Signs: ED Triage Vitals  Encounter Vitals Group     BP 07/05/23 0123 94/63     Systolic BP Percentile --      Diastolic BP Percentile --      Pulse Rate 07/05/23 0123 93     Resp 07/05/23 0123 17     Temp 07/05/23 0123 98.7 F (37.1 C)     Temp Source 07/05/23 0123 Oral     SpO2 07/05/23 0123 96 %     Weight 07/05/23 0124 240 lb (108.9 kg)     Height 07/05/23 0124 5\' 10"  (1.778 m)     Head Circumference --      Peak Flow --      Pain Score --      Pain Loc --      Pain Education --      Exclude from Growth Chart --     Most recent vital signs: Vitals:   07/05/23 0300 07/05/23 0400  BP: 124/65 (!) 104/52  Pulse: 89 80  Resp: (!) 31 (!) 24  Temp:    SpO2: 91% 97%    General: Awake, opens eyes spontaneously and occasionally winces in pain. CV:  Dry mucous membranes looks slightly dehydrated Resp:  Normal effort.  His lungs  are clear to auscultation without any obvious focality or wheezing Abd:  Moderate distention but no rigidity guarding Other:  Head to toe skin examination shows no obvious cellulitic changes or pressure ulcers to the back or buttocks.   ED Results / Procedures / Treatments   Labs (all labs ordered are listed, but only abnormal results are displayed) Labs Reviewed  LACTIC ACID, PLASMA - Abnormal; Notable for the following components:      Result Value   Lactic Acid, Venous 3.5 (*)    All other components within normal limits  COMPREHENSIVE METABOLIC PANEL WITH GFR - Abnormal; Notable for the following components:   Sodium 132 (*)    Chloride 96 (*)    Glucose, Bld 215 (*)    Creatinine, Ser 1.78 (*)    Calcium 8.4 (*)    Albumin 2.9 (*)    GFR, Estimated 39 (*)    All other components within normal limits  CBC WITH DIFFERENTIAL/PLATELET - Abnormal; Notable for the following components:   WBC 27.1 (*)    RBC 4.11 (*)    Hemoglobin 11.9 (*)  HCT 36.7 (*)    Neutro Abs 22.5 (*)    Monocytes Absolute 1.8 (*)    Abs Immature Granulocytes 0.85 (*)    All other components within normal limits  PROTIME-INR - Abnormal; Notable for the following components:   Prothrombin Time 15.8 (*)    All other components within normal limits  URINALYSIS, W/ REFLEX TO CULTURE (INFECTION SUSPECTED) - Abnormal; Notable for the following components:   Color, Urine AMBER (*)    APPearance CLOUDY (*)    Glucose, UA 50 (*)    Hgb urine dipstick SMALL (*)    Bilirubin Urine SMALL (*)    Protein, ur 100 (*)    Bacteria, UA FEW (*)    All other components within normal limits  RESP PANEL BY RT-PCR (RSV, FLU A&B, COVID)  RVPGX2  CULTURE, BLOOD (ROUTINE X 2)  CULTURE, BLOOD (ROUTINE X 2)  URINE CULTURE  LACTIC ACID, PLASMA  TROPONIN I (HIGH SENSITIVITY)     I ordered and reviewed the above labs they are notable for is an elevated lactic at 3.5.  He has an AKI with a GFR of 39 marked elevation of  white blood cell count of 27  EKG  ED ECG REPORT I, Buell Carmin, the attending physician, personally viewed and interpreted this ECG.   Date: 07/05/2023  EKG Time: 0147  Rate: 91  Rhythm: sinus  Axis: nl  Intervals:nl  ST&T Change: no stemi    PROCEDURES:  Critical Care performed: Yes, see critical care procedure note(s)  .Critical Care  Performed by: Buell Carmin, MD Authorized by: Buell Carmin, MD   Critical care provider statement:    Critical care time (minutes):  45   Critical care was time spent personally by me on the following activities:  Development of treatment plan with patient or surrogate, discussions with consultants, evaluation of patient's response to treatment, examination of patient, ordering and review of laboratory studies, ordering and review of radiographic studies, ordering and performing treatments and interventions, pulse oximetry, re-evaluation of patient's condition and review of old charts    MEDICATIONS ORDERED IN ED: Medications  ondansetron (ZOFRAN) injection 4 mg (4 mg Intravenous Not Given 07/05/23 0236)  vancomycin (VANCOREADY) IVPB 2000 mg/400 mL (2,000 mg Intravenous New Bag/Given 07/05/23 0416)  sodium chloride 0.9 % bolus 1,000 mL (0 mLs Intravenous Stopped 07/05/23 0347)  morphine (PF) 4 MG/ML injection 4 mg (4 mg Intravenous Given 07/05/23 0309)  ceFEPIme (MAXIPIME) 2 g in sodium chloride 0.9 % 100 mL IVPB (0 g Intravenous Stopped 07/05/23 0303)  metroNIDAZOLE (FLAGYL) IVPB 500 mg (0 mg Intravenous Stopped 07/05/23 0347)  sodium chloride 0.9 % bolus 1,000 mL (0 mLs Intravenous Stopped 07/05/23 0241)  pantoprazole (PROTONIX) injection 40 mg (40 mg Intravenous Given 07/05/23 0233)  iohexol (OMNIPAQUE) 300 MG/ML solution 80 mL (80 mLs Intravenous Contrast Given 07/05/23 0350)     IMPRESSION / MDM / ASSESSMENT AND PLAN / ED COURSE  I reviewed the triage vital signs and the nursing notes.                                Patient's presentation  is most consistent with acute presentation with potential threat to life or bodily function.  Differential diagnosis includes, but is not limited to, sepsis, intra-abdominal infection, lung infection, skin infection, dehydration electrolyte disturbance, ACS   The patient is on the cardiac monitor to evaluate for evidence of arrhythmia and/or significant  heart rate changes.  MDM:    This is a sick patient with lactic acidosis elevated white blood cell count tachypnea concerning for sepsis, but hard to discern where his source might be given his nonverbal state.  Fortunately his wife is at bedside and his caretaker and has done a very good job taking care of him over the years, notes that he has been wincing in pain but no other focal infectious symptoms.  He does have a history of UC, and so we will do a very broad infectious workup including normal sepsis labs, urinalysis, viral testing, and a CT of the chest abdomen pelvis.  Skin examination head to toe did not show any skin infections.  I have treated him for sepsis with empiric broad-spectrum antibiotics as well as 30 cc/kg ideal body weight fluid bolus.  He will be admitted.  -- Turns out he has cholecystitis on imaging.  I spoke with radiologist about these findings that are consistent with acute cholecystitis, and I spoke with the family regarding these findings, and also spoke with Dr. Denice Finney of general surgery.  Given his medical comorbidities to be admitted to hospitalist service with surgical consultation.       FINAL CLINICAL IMPRESSION(S) / ED DIAGNOSES   Final diagnoses:  Sepsis, due to unspecified organism, unspecified whether acute organ dysfunction present [facility])  Acute cholecystitis     Rx / DC Orders   ED Discharge Orders     None        Note:  This document was prepared using Dragon voice recognition software and may include unintentional dictation errors.    Buell Carmin, MD 07/05/23 Raliegh Burgess    Buell Carmin, MD 07/05/23 [phone]

## 2023-07-05 NOTE — Progress Notes (Signed)
 ED Pharmacy Antibiotic Sign Off An antibiotic consult was received from an ED provider for Vancomycin per pharmacy dosing for sepsis. A chart review was completed to assess appropriateness.   The following one time order(s) were placed:  Vancomycin 2000 mg per pt wt: 108.9 kg  Further antibiotic and/or antibiotic pharmacy consults should be ordered by the admitting provider if indicated.   Thank you for allowing pharmacy to be a part of this patient's care.   Thank you, Coretta Dexter, PharmD, [facility] 07/05/2023 2:27 AM

## 2023-07-05 NOTE — ED Notes (Signed)
 CCMD contacted to place pt on the monitor.

## 2023-07-05 NOTE — Progress Notes (Addendum)
 Pt has an order for NPO, pt had a chole drain place today. came in for acute cholecystectomy. Day team consulted speech therapy to assess pt but SLP is unable to complete assessment due to pt being NPO. Pt is drowsy and lethargic, has hx of dementia, intermittently follows commands. I tried reading MD's note why pt is NPO, no note about this. Ask MD coverage if ok to keep pt NPO for now including p.o medications. Pt still lethargic. Provider order to keep pt NPO. No other concern at the moment. Plan of care continued.

## 2023-07-05 NOTE — H&P (Signed)
 History and Physical    [place] WENZLICK ZOX:[id number] DOB: Dec 16, 1945 DOA: 07/05/2023  PCP: Antonio Baumgarten, MD (Confirm with patient/family/NH records and if not entered, this has to be entered at [facility] point of entry) Patient coming from: [facility]  I have personally briefly reviewed patient's old medical records in [facility]  Chief Complaint: No complaints  HPI: Jeff Key is a 78 y.o. male with medical history significant of advanced dementia nonverbal at baseline, CAD, ischemic cardiomyopathy, chronic HFrEF with LVEF 45-50%, chronic ulcerative proctosigmoiditis, IIDM, obesity brought in by family member for evaluation of worsening GI symptoms including intermittent nausea with vomiting and subsequent fever and chills and abdominal pain.  Patient is nonverbal at baseline and unable to provide any history, history provided by wife at bedside.  Symptoms started 7 days ago, patient started develop intermittent nausea vomiting and epigastric discomfort, " morning after eating" and occasional vomiting of stomach content nonbloody nonbilious no diarrhea.  And increased of cold sweat.  Wife brought patient to ED, workup including a CT abdomen pelvis showed no acute findings and patient was reassured and sent home.  Last few days, symptoms became more constant, and wife decided to bring patient to the hospital this morning.  ED Course: Afebrile, nontachycardic blood pressure 111/60 O2 saturation 100% on room air.  Blood work showed lactic acid 3.5> 2.3, creatinine 1.7 compared to baseline 0.8 glucose 215, WBC 27, hemoglobin 11.9, AST ALT bilirubin level within normal limits, CT abdominal pelvis with contrast showed signs of acute cholecystitis.  Patient was given a total of 2 L IV boluses and started on vancomycin cefepime and Flagyl.  Review of Systems: Unable to perform, nonverbal at baseline Past Medical History:  Diagnosis Date   Anemia ~ 11/2015   CAD (coronary artery disease)    a.  inf STEMI on 02/10/16. RCA treated with DES followed by staged PCI of LAD and Circumflex 02/14/16.   Dementia (HCC) dx'd 01/2016   GERD (gastroesophageal reflux disease)    History of stomach ulcers 1980s?   Ischemic cardiomyopathy    a. 01/2016: EF 40-45%.     Past Surgical History:  Procedure Laterality Date   CARDIAC CATHETERIZATION N/A 02/10/2016   Procedure: Left Heart Cath and Coronary Angiography;  Surgeon: Avanell Leigh, MD;  Location: [facility] INVASIVE CV LAB;  Service: Cardiovascular;  Laterality: N/A;   CARDIAC CATHETERIZATION N/A 02/10/2016   Procedure: Coronary Stent Intervention;  Surgeon: Avanell Leigh, MD;  Location: [facility] INVASIVE CV LAB;  Service: Cardiovascular;  Laterality: N/A;   CARDIAC CATHETERIZATION N/A 02/14/2016   Procedure: Coronary Stent Intervention-LAD and CFX;  Surgeon: Avanell Leigh, MD;  Location: [facility] INVASIVE CV LAB;  Service: Cardiovascular;  Laterality: N/A;   COLONOSCOPY WITH PROPOFOL N/A 05/06/2015   Procedure: COLONOSCOPY WITH PROPOFOL;  Surgeon: Stephens Eis, MD;  Location: [facility] ENDOSCOPY;  Service: Gastroenterology;  Laterality: N/A;   CORONARY ANGIOPLASTY WITH STENT PLACEMENT  02/14/2016   "2 stents"   ESOPHAGOGASTRODUODENOSCOPY (EGD) WITH PROPOFOL N/A 05/06/2015   Procedure: ESOPHAGOGASTRODUODENOSCOPY (EGD) WITH PROPOFOL;  Surgeon: Stephens Eis, MD;  Location: [facility] ENDOSCOPY;  Service: Gastroenterology;  Laterality: N/A;   FOREARM FRACTURE SURGERY Right 1985   "got it tore up in [place]"   FRACTURE SURGERY       reports that he quit smoking about 35 years ago. His smoking use included cigarettes. He started smoking about 65 years ago. He has a 60 pack-year smoking history. He has never used smokeless tobacco. He  reports that he does not drink alcohol and does not use drugs.  No Known Allergies  Family History  Problem Relation Age of Onset   Prostate cancer Father    Bladder Cancer Neg Hx    Kidney cancer Neg Hx      Prior to Admission  medications   Medication Sig Start Date End Date Taking? Authorizing Provider  aspirin EC 81 MG tablet Take 81 mg by mouth daily.   Yes [provider]  atorvastatin (LIPITOR) 20 MG tablet Take 1 tablet (20 mg total) by mouth daily. 11/21/22  Yes Runell Gess, MD  balsalazide (COLAZAL) 750 MG capsule Take 2,250 mg by mouth 3 (three) times daily. 09/23/19  Yes [provider]  clopidogrel (PLAVIX) 75 MG tablet Take 1 tablet (75 mg total) by mouth daily. 11/21/22  Yes Runell Gess, MD  escitalopram (LEXAPRO) 5 MG tablet Take 5 mg by mouth daily. 08/29/19  Yes [provider]  ferrous sulfate 325 (65 FE) MG tablet Take 325 mg by mouth daily with breakfast.   Yes [provider]  galantamine (RAZADYNE) 12 MG tablet Take 12 mg by mouth 2 (two) times daily with a meal. 09/02/19  Yes [provider]  lisinopril (ZESTRIL) 10 MG tablet Take 1 tablet (10 mg total) by mouth daily. 11/21/22  Yes Runell Gess, MD  memantine (NAMENDA) 10 MG tablet Take 10 mg by mouth 2 (two) times daily. 08/07/19  Yes [provider]  metFORMIN (GLUCOPHAGE) 500 MG tablet Take 500 mg by mouth 2 (two) times daily with a meal.   Yes [provider]  nitroGLYCERIN (NITROSTAT) 0.4 MG SL tablet Place 1 tablet (0.4 mg total) under the tongue every 5 (five) minutes as needed for chest pain. 02/15/16  Yes Janetta Hora, PA-C  ondansetron (ZOFRAN) 4 MG tablet Take 1 tablet (4 mg total) by mouth every 6 (six) hours as needed for up to 7 days for nausea or vomiting. 06/29/23 07/06/23 Yes Ray, Danie Binder, MD  vitamin B-12 (CYANOCOBALAMIN) 1000 MCG tablet Take 1,000 mcg by mouth daily.   Yes [provider]  metoprolol tartrate (LOPRESSOR) 25 MG tablet Hold until followup with outpatient provider since heart rate was low without it. Patient not taking: Reported on 07/05/2023 11/20/21   Darlin Priestly, MD  omeprazole (PRILOSEC) 20 MG capsule Take 20 mg by mouth daily. Patient  not taking: Reported on 07/05/2023    [provider]    Physical Exam: Vitals:   07/05/23 0300 07/05/23 0400 07/05/23 0530 07/05/23 0600  BP: 124/65 (!) 104/52 111/60 115/79  Pulse: 89 80 75 76  Resp: (!) 31 (!) 24 20 17   Temp:      TempSrc:      SpO2: 91% 97% 95% 96%  Weight:      Height:        Constitutional: NAD, calm, comfortable Vitals:   07/05/23 0300 07/05/23 0400 07/05/23 0530 07/05/23 0600  BP: 124/65 (!) 104/52 111/60 115/79  Pulse: 89 80 75 76  Resp: (!) 31 (!) 24 20 17   Temp:      TempSrc:      SpO2: 91% 97% 95% 96%  Weight:      Height:       Eyes: PERRL, lids and conjunctivae normal ENMT: Mucous membranes are moist. Posterior pharynx clear of any exudate or lesions.Normal dentition.  Neck: normal, supple, no masses, no thyromegaly Respiratory: clear to auscultation bilaterally, no wheezing, no crackles. Normal respiratory effort.  No accessory muscle use.  Cardiovascular: Regular rate and rhythm, no murmurs / rubs / gallops. No extremity edema. 2+ pedal pulses. No carotid bruits.  Abdomen: Epigastric area tenderness, no rebound no guarding, no masses palpated. No hepatosplenomegaly. Bowel sounds positive.  Musculoskeletal: no clubbing / cyanosis. No joint deformity upper and lower extremities. Good ROM, no contractures. Normal muscle tone.  Skin: no rashes, lesions, ulcers. No induration Neurologic: No facial droops, moving all limbs, not following commands Psychiatric: Awake, lethargic, confused    Labs on Admission: I have personally reviewed following labs and imaging studies  CBC: Recent Labs  Lab 06/28/23 2057 07/05/23 0145  WBC 14.4* 27.1*  NEUTROABS 12.4* 22.5*  HGB 13.6 11.9*  HCT 41.7 36.7*  MCV 89.5 89.3  PLT 329 346   Basic Metabolic Panel: Recent Labs  Lab 06/28/23 2057 07/05/23 0145  NA 130* 132*  K 4.2 3.9  CL 97* 96*  CO2 22 22  GLUCOSE 166* 215*  BUN 14 20  CREATININE 0.82 1.78*  CALCIUM 8.6* 8.4*    GFR: Estimated Creatinine Clearance: 42.3 mL/min (A) (by C-G formula based on SCr of 1.78 mg/dL (H)). Liver Function Tests: Recent Labs  Lab 06/28/23 2057 07/05/23 0145  AST 35 23  ALT 26 15  ALKPHOS 92 72  BILITOT 0.7 1.1  PROT 7.9 7.3  ALBUMIN 3.8 2.9*   No results for input(s): "LIPASE", "AMYLASE" in the last 168 hours. No results for input(s): "AMMONIA" in the last 168 hours. Coagulation Profile: Recent Labs  Lab 06/28/23 2057 07/05/23 0145  INR 1.0 1.2   Cardiac Enzymes: No results for input(s): "CKTOTAL", "CKMB", "CKMBINDEX", "TROPONINI" in the last 168 hours. BNP (last 3 results) No results for input(s): "PROBNP" in the last 8760 hours. HbA1C: No results for input(s): "HGBA1C" in the last 72 hours. CBG: No results for input(s): "GLUCAP" in the last 168 hours. Lipid Profile: No results for input(s): "CHOL", "HDL", "LDLCALC", "TRIG", "CHOLHDL", "LDLDIRECT" in the last 72 hours. Thyroid Function Tests: No results for input(s): "TSH", "T4TOTAL", "FREET4", "T3FREE", "THYROIDAB" in the last 72 hours. Anemia Panel: No results for input(s): "VITAMINB12", "FOLATE", "FERRITIN", "TIBC", "IRON", "RETICCTPCT" in the last 72 hours. Urine analysis:    Component Value Date/Time   COLORURINE AMBER (A) 07/05/2023 0308   APPEARANCEUR CLOUDY (A) 07/05/2023 0308   APPEARANCEUR Clear 10/16/2014 1539   LABSPEC 1.029 07/05/2023 0308   PHURINE 5.0 07/05/2023 0308   GLUCOSEU 50 (A) 07/05/2023 0308   HGBUR SMALL (A) 07/05/2023 0308   BILIRUBINUR SMALL (A) 07/05/2023 0308   BILIRUBINUR Negative 10/16/2014 1539   KETONESUR NEGATIVE 07/05/2023 0308   PROTEINUR 100 (A) 07/05/2023 0308   NITRITE NEGATIVE 07/05/2023 0308   LEUKOCYTESUR NEGATIVE 07/05/2023 0308    Radiological Exams on Admission: CT CHEST ABDOMEN PELVIS W CONTRAST Result Date: 07/05/2023 CLINICAL DATA:  Vomiting, abdominal pain, and sepsis. EXAM: CT CHEST, ABDOMEN, AND PELVIS WITH CONTRAST TECHNIQUE: Multidetector  CT imaging of the chest, abdomen and pelvis was performed following the standard protocol during bolus administration of intravenous contrast. RADIATION DOSE REDUCTION: This exam was performed according to the departmental dose-optimization program which includes automated exposure control, adjustment of the mA and/or kV according to patient size and/or use of iterative reconstruction technique. CONTRAST:  80mL OMNIPAQUE IOHEXOL 300 MG/ML  SOLN COMPARISON:  Portable chest today, abdomen and pelvis CT with contrast 06/29/2023, and chest, abdomen and pelvis CT with IV contrast 11/16/2021. FINDINGS: CT CHEST FINDINGS Cardiovascular: There is mild cardiomegaly. Three-vessel coronary artery calcifications with  prior LAD stenting. There are patchy calcific plaques in the thoracic aorta, scattered calcification in the great vessels. No aneurysm, dissection or stenosis. There are calcifications and thickening of the aortic valve leaflets. Consider echocardiographic follow-up to assess valvular function. There is no pericardial effusion. The pulmonary arteries and veins are normal in caliber. The pulmonary arteries are centrally clear. Mediastinum/Nodes: Chronic mediastinal lipomatosis. No adenopathy. Hiatal fat hernia. Lungs/Pleura: Chronic left-sided volume loss and posterior basal atelectasis. There is respiratory motion. Mild posterior atelectasis on the right. No consolidation or nodule is seen through the breathing motion. No effusion. Trachea and central airways are clear Musculoskeletal: 1 cm bone island again noted T1 vertebral body. No acute or other significant osseous findings. Degenerative disc disease and spondylosis mid to lower thoracic spine. The ribcage is intact. Small amount of gynecomastia noted bilaterally. CT ABDOMEN PELVIS FINDINGS Hepatobiliary: The liver is 24 cm in length and moderately steatotic. There is no mass enhancement. New compared with 6 days ago, there is increased gallbladder wall  thickening, pericholecystic fluid and pericholecystic edema. Findings are most likely due to acute cholecystitis. There is a single 1 cm partially calcified stone in the proximal gallbladder. There are no other visible stones. There is no intrahepatic or extrahepatic bile duct dilatation. Pancreas: Partially atrophic.  Otherwise unremarkable. Spleen: Unremarkable. Adrenals/Urinary Tract: There is no adrenal or renal mass enhancement. There are 2 punctate nonobstructive caliceal stones in the superior pole of the right kidney. No left nephrolithiasis is seen and no ureteral stones or hydronephrosis bilaterally. The bladder is contracted and not well seen. Stomach/Bowel: No dilatation or wall thickening including the appendix. Scattered colonic diverticula without diverticulitis. Vascular/Lymphatic: Aortic atherosclerosis. No enlarged abdominal or pelvic lymph nodes. Reproductive: No prostatomegaly. Other: No pelvic ascites. Trace reactive fluid right paracolic gutter. No free air, free hemorrhage or abscess. Small umbilical and bilateral inguinal fat hernias. Musculoskeletal: Degenerative change and mild dextroscoliosis lumbar spine. Mild hip DJD. No acute or significant regional osseous findings. IMPRESSION: 1. No acute chest CT findings. 2. Cardiomegaly with aortic and coronary artery atherosclerosis. 3. Calcifications and thickening of the aortic valve leaflets. Consider echocardiographic follow-up to assess valvular function. 4. Hiatal fat hernia. 5. Chronic left-sided volume loss and posterior basal atelectasis. 6. Cholelithiasis with increased gallbladder wall thickening, pericholecystic fluid and pericholecystic edema, most likely due to acute cholecystitis. 7. Hepatomegaly and steatosis. 8. Nonobstructive right micronephrolithiasis. 9. Diverticulosis without evidence of diverticulitis. 10. Umbilical and inguinal fat hernias. 11. Critical Value/emergent results were called by telephone at the time of  interpretation on 07/05/2023 at 4:22 am to provider [facility] , who verbally acknowledged these results. Electronically Signed   By: Denman Fischer M.D.   On: 07/05/2023 04:36   DG Chest Port 1 View Result Date: 07/05/2023 CLINICAL DATA:  Possible sepsis EXAM: PORTABLE CHEST 1 VIEW COMPARISON:  06/28/2023 FINDINGS: Stable cardiomegaly. Lungs are hypoinflated but clear. No bony abnormality is noted. IMPRESSION: Unchanged from the prior exam. Electronically Signed   By: Violeta Grey M.D.   On: 07/05/2023 02:08    EKG: Independently reviewed.  Sinus rhythm, no acute ST changes.  Assessment/Plan Principal Problem:   Severe sepsis (HCC) Active Problems:   Sepsis (HCC)   Acute cholecystitis  (please populate well all problems here in Problem List. (For example, if patient is on BP meds at home and you resume or decide to hold them, it is a problem that needs to be her. Same for CAD, COPD, HLD and so on)  Sepsis with  acute endorgan damage - Sepsis evidenced by elevated leukocytosis, and elevated lactic acid level, with signs of endorgan damage of AKI, source of infection is acute cholecystitis. - Patient received IV boluses 2.5 L in the ED, given the patient history of HFrEF with low LVEF, we will hold off maintenance IV fluid.   - Continue Zosyn - General Surgeon's note appreciated, given the complicated cardiac history, general surgeon offered IR colostomy drainage and IV antibiotics and postpone surgery.  As per recommendation of surgery, we will hold off Plavix.  Family aware and agreed with the plan.  AKI - Prerenal likely secondary to sepsis, fluid resuscitation plan as above - Repeat BMP tomorrow  History of CAD Ischemic cardiomyopathy Chronic HFrEF - Clinically patient appears to be euvolemic - Monitor off maintenance IV fluid given history of low LVEF.  No recent echocardiogram, last echo was done in 2018. Will order Echo to guide fluid resuscitation. - Hold off on BP medications -  As needed hydralazine - No chest pains troponin slightly elevated, echocardiogram to follow  HTN - As above  IIDM - Hold off metformin as patient received IV contrast today -SSI  Obesity - BMI= 34 - Recommend calorie restriction  Advanced dementia - Mentation at baseline as per family - Continue memantine  DVT prophylaxis: Lovenox Code Status: Full code Family Communication: Wife at bedside Disposition Plan: Patient presented with sepsis secondary to cholecystitis requiring IV antibiotics and multidiscipline management, expect more than 2 midnight hospital stay Consults called: General surgeon, IR Admission status: PCU admit  [place] MD [facility] Pager [phone]  07/05/2023, 9:02 AM

## 2023-07-05 NOTE — Progress Notes (Signed)
 SLP Cancellation Note  Patient Details Name: Jeff Key MRN: [id number] DOB: 1945-07-19   Cancelled treatment:       Reason Eval/Treat Not Completed: Medical issues which prohibited therapy (Pt NPO for cholecystectomy per chart review.)   [name], M.S., CCC-SLP Speech-Language Pathologist [facility] [phone] [place])   [name] 07/05/2023, 12:00 PM

## 2023-07-05 NOTE — Consult Note (Signed)
 Subjective:   CC: acute cholecystitis  HPI:  Jeff Key is a 78 y.o. male who is consulted by Modesto Charon for evaluation of above cc.  Non-verbal and bedbound at baseline.  Presented for cold sweats and lethargy.   Symptoms were first noted 1 days ago. Decreased appetite and occasionally winces in pain.  Hx obtained from wife at bedside.  Past Medical History:  has a past medical history of Anemia (~ 11/2015), CAD (coronary artery disease), Dementia (HCC) (dx'd 01/2016), GERD (gastroesophageal reflux disease), History of stomach ulcers (1980s?), and Ischemic cardiomyopathy.  Past Surgical History:  has a past surgical history that includes Fracture surgery; Colonoscopy with propofol (N/A, 05/06/2015); Esophagogastroduodenoscopy (egd) with propofol (N/A, 05/06/2015); Cardiac catheterization (N/A, 02/10/2016); Cardiac catheterization (N/A, 02/10/2016); Coronary angioplasty with stent (02/14/2016); Forearm fracture surgery (Right, 1985); and Cardiac catheterization (N/A, 02/14/2016).  Family History: family history includes Prostate cancer in his father.  Social History:  reports that he quit smoking about 35 years ago. His smoking use included cigarettes. He started smoking about 65 years ago. He has a 60 pack-year smoking history. He has never used smokeless tobacco. He reports that he does not drink alcohol and does not use drugs.  Current Medications:  Prior to Admission medications   Medication Sig Start Date End Date Taking? Authorizing Provider  aspirin EC 81 MG tablet Take 81 mg by mouth daily.   Yes [provider]  atorvastatin (LIPITOR) 20 MG tablet Take 1 tablet (20 mg total) by mouth daily. 11/21/22  Yes Runell Gess, MD  balsalazide (COLAZAL) 750 MG capsule Take 2,250 mg by mouth 3 (three) times daily. 09/23/19  Yes [provider]  clopidogrel (PLAVIX) 75 MG tablet Take 1 tablet (75 mg total) by mouth daily. 11/21/22  Yes Runell Gess, MD  escitalopram (LEXAPRO)  5 MG tablet Take 5 mg by mouth daily. 08/29/19  Yes [provider]  ferrous sulfate 325 (65 FE) MG tablet Take 325 mg by mouth daily with breakfast.   Yes [provider]  galantamine (RAZADYNE) 12 MG tablet Take 12 mg by mouth 2 (two) times daily with a meal. 09/02/19  Yes [provider]  lisinopril (ZESTRIL) 10 MG tablet Take 1 tablet (10 mg total) by mouth daily. 11/21/22  Yes Runell Gess, MD  memantine (NAMENDA) 10 MG tablet Take 10 mg by mouth 2 (two) times daily. 08/07/19  Yes [provider]  metFORMIN (GLUCOPHAGE) 500 MG tablet Take 500 mg by mouth 2 (two) times daily with a meal.   Yes [provider]  nitroGLYCERIN (NITROSTAT) 0.4 MG SL tablet Place 1 tablet (0.4 mg total) under the tongue every 5 (five) minutes as needed for chest pain. 02/15/16  Yes Janetta Hora, PA-C  ondansetron (ZOFRAN) 4 MG tablet Take 1 tablet (4 mg total) by mouth every 6 (six) hours as needed for up to 7 days for nausea or vomiting. 06/29/23 07/06/23 Yes Ray, Danie Binder, MD  vitamin B-12 (CYANOCOBALAMIN) 1000 MCG tablet Take 1,000 mcg by mouth daily.   Yes [provider]  metoprolol tartrate (LOPRESSOR) 25 MG tablet Hold until followup with outpatient provider since heart rate was low without it. Patient not taking: Reported on 07/05/2023 11/20/21   Darlin Priestly, MD  omeprazole (PRILOSEC) 20 MG capsule Take 20 mg by mouth daily. Patient not taking: Reported on 07/05/2023    [provider]    Allergies:  Allergies as of 07/05/2023   (No Known Allergies)  ROS:  Pertinent positives and negatives noted in HPI   Objective:     BP 115/79   Pulse 76   Temp 98.7 F (37.1 C) (Oral)   Resp 17   Ht 5\' 10"  (1.778 m)   Wt 108.9 kg   SpO2 96%   BMI 34.44 kg/m    Constitutional :  no distress  Respiratory:  Clear to auscultation bilaterally  Cardiovascular:  Regular rate and rhythm  Gastrointestinal: Soft, no guarding, some distention .    Skin: Cool and moist          LABS:     Latest Ref Rng & Units 07/05/2023    1:45 AM 06/28/2023    8:57 PM 11/20/2021    3:48 AM  CMP  Glucose 70 - 99 mg/dL 161  096  045   BUN 8 - 23 mg/dL 20  14  10    Creatinine 0.61 - 1.24 mg/dL 4.09  8.11  9.14   Sodium 135 - 145 mmol/L 132  130  131   Potassium 3.5 - 5.1 mmol/L 3.9  4.2  3.4   Chloride 98 - 111 mmol/L 96  97  101   CO2 22 - 32 mmol/L 22  22  22    Calcium 8.9 - 10.3 mg/dL 8.4  8.6  8.0   Total Protein 6.5 - 8.1 g/dL 7.3  7.9    Total Bilirubin 0.0 - 1.2 mg/dL 1.1  0.7    Alkaline Phos 38 - 126 U/L 72  92    AST 15 - 41 U/L 23  35    ALT 0 - 44 U/L 15  26        Latest Ref Rng & Units 07/05/2023    1:45 AM 06/28/2023    8:57 PM 11/20/2021    3:48 AM  CBC  WBC 4.0 - 10.5 K/uL 27.1  14.4  8.9   Hemoglobin 13.0 - 17.0 g/dL 78.2  95.6  21.3   Hematocrit 39.0 - 52.0 % 36.7  41.7  34.0   Platelets 150 - 400 K/uL 346  329  224      RADS: CLINICAL DATA:  Vomiting, abdominal pain, and sepsis.   EXAM: CT CHEST, ABDOMEN, AND PELVIS WITH CONTRAST   TECHNIQUE: Multidetector CT imaging of the chest, abdomen and pelvis was performed following the standard protocol during bolus administration of intravenous contrast.   RADIATION DOSE REDUCTION: This exam was performed according to the departmental dose-optimization program which includes automated exposure control, adjustment of the mA and/or kV according to patient size and/or use of iterative reconstruction technique.   CONTRAST:  80mL OMNIPAQUE IOHEXOL 300 MG/ML  SOLN   COMPARISON:  Portable chest today, abdomen and pelvis CT with contrast 06/29/2023, and chest, abdomen and pelvis CT with IV contrast 11/16/2021.   FINDINGS: CT CHEST FINDINGS   Cardiovascular: There is mild cardiomegaly. Three-vessel coronary artery calcifications with prior LAD stenting.   There are patchy calcific plaques in the thoracic aorta, scattered calcification in the great vessels. No  aneurysm, dissection or stenosis.   There are calcifications and thickening of the aortic valve leaflets. Consider echocardiographic follow-up to assess valvular function.   There is no pericardial effusion. The pulmonary arteries and veins are normal in caliber. The pulmonary arteries are centrally clear.   Mediastinum/Nodes: Chronic mediastinal lipomatosis. No adenopathy. Hiatal fat hernia.   Lungs/Pleura: Chronic left-sided volume loss and posterior basal atelectasis. There is respiratory motion. Mild posterior atelectasis on the right.  No consolidation or nodule is seen through the breathing motion. No effusion. Trachea and central airways are clear   Musculoskeletal: 1 cm bone island again noted T1 vertebral body. No acute or other significant osseous findings.   Degenerative disc disease and spondylosis mid to lower thoracic spine.   The ribcage is intact. Small amount of gynecomastia noted bilaterally.   CT ABDOMEN PELVIS FINDINGS   Hepatobiliary: The liver is 24 cm in length and moderately steatotic. There is no mass enhancement.   New compared with 6 days ago, there is increased gallbladder wall thickening, pericholecystic fluid and pericholecystic edema. Findings are most likely due to acute cholecystitis.   There is a single 1 cm partially calcified stone in the proximal gallbladder. There are no other visible stones.   There is no intrahepatic or extrahepatic bile duct dilatation.   Pancreas: Partially atrophic.  Otherwise unremarkable.   Spleen: Unremarkable.   Adrenals/Urinary Tract: There is no adrenal or renal mass enhancement.   There are 2 punctate nonobstructive caliceal stones in the superior pole of the right kidney.   No left nephrolithiasis is seen and no ureteral stones or hydronephrosis bilaterally. The bladder is contracted and not well seen.   Stomach/Bowel: No dilatation or wall thickening including the appendix. Scattered  colonic diverticula without diverticulitis.   Vascular/Lymphatic: Aortic atherosclerosis. No enlarged abdominal or pelvic lymph nodes.   Reproductive: No prostatomegaly.   Other: No pelvic ascites. Trace reactive fluid right paracolic gutter. No free air, free hemorrhage or abscess. Small umbilical and bilateral inguinal fat hernias.   Musculoskeletal: Degenerative change and mild dextroscoliosis lumbar spine. Mild hip DJD. No acute or significant regional osseous findings.   IMPRESSION: 1. No acute chest CT findings. 2. Cardiomegaly with aortic and coronary artery atherosclerosis. 3. Calcifications and thickening of the aortic valve leaflets. Consider echocardiographic follow-up to assess valvular function. 4. Hiatal fat hernia. 5. Chronic left-sided volume loss and posterior basal atelectasis. 6. Cholelithiasis with increased gallbladder wall thickening, pericholecystic fluid and pericholecystic edema, most likely due to acute cholecystitis. 7. Hepatomegaly and steatosis. 8. Nonobstructive right micronephrolithiasis. 9. Diverticulosis without evidence of diverticulitis. 10. Umbilical and inguinal fat hernias. 11. Critical Value/emergent results were called by telephone at the time of interpretation on 07/05/2023 at 4:22 am to provider [facility] , who verbally acknowledged these results.     Electronically Signed   By: Denman Fischer M.D.   On: 07/05/2023 04:36   Assessment:      Acute cholecystitis with comorbidities noted above and anticoagulation  Plan:    Recommend IR guided cholecystostomy tube placement due to high risk of complications perioperatively from his comorbidities and current anticoagulation.  IV antibiotics in the meantime and supportive care per hospitalist team.  labs/images/medications/previous chart entries reviewed personally and relevant changes/updates noted above.

## 2023-07-05 NOTE — ED Notes (Signed)
 Patient checked for urinary incontinence.  Peri care provided, a new adult brief and chux pad placed.  Echocardiogram tech present to perform echo.

## 2023-07-05 NOTE — Consult Note (Addendum)
 Chief Complaint:  Cholecystitis  Procedure: Percutaneous cholecystostomy   Referring Provider(s): Dr. Luan Moore  Supervising Physician: Richarda Overlie  Patient Status: [facility] - In-pt  History of Present Illness: Jeff Key is a 78 y.o. male with a history of advanced dementia nonverbal at baseline, ischemic cardiomyopathy, and inferior STEMI in 2017 now on ASA and Plavix. History obtained from wife at the bedside. States that he has not been acting like himself for the past 7-10 days. She brought him to the ED last week d/t fever, nausea, and intermittent vomiting; however the workup was without acute findings and the patient was sent home. He returns today with similar symptoms of nausea and intermittent vomiting. Wife states he has not been febrile the past few days, but that he is not acting like himself and she thinks he may be in pain.   ED workup today significant for lactic of 3.5>2.3, WBC 27.1, and CT A/P concerning for cholelithiasis with increased gallbladder wall thickening, pericholecystic fluid, and pericholecystic edema, most likely due to acute cholecystitis. AST, ALT, and bilirubin all WNL.   IR consulted for percutaneous cholecystostomy tube placement as patient is on antiplatelets and a poor surgical candidate. Discussed with the family at bedside that the chole tube may be in place permanently. Wife verbalized understanding and is okay to proceed.   Patient is Full Code  Past Medical History:  Diagnosis Date   Anemia ~ 11/2015   CAD (coronary artery disease)    a. inf STEMI on 02/10/16. RCA treated with DES followed by staged PCI of LAD and Circumflex 02/14/16.   Dementia (HCC) dx'd 01/2016   GERD (gastroesophageal reflux disease)    History of stomach ulcers 1980s?   Ischemic cardiomyopathy    a. 01/2016: EF 40-45%.     Past Surgical History:  Procedure Laterality Date   CARDIAC CATHETERIZATION N/A 02/10/2016   Procedure: Left Heart Cath and Coronary  Angiography;  Surgeon: Runell Gess, MD;  Location: [facility] INVASIVE CV LAB;  Service: Cardiovascular;  Laterality: N/A;   CARDIAC CATHETERIZATION N/A 02/10/2016   Procedure: Coronary Stent Intervention;  Surgeon: Runell Gess, MD;  Location: [facility] INVASIVE CV LAB;  Service: Cardiovascular;  Laterality: N/A;   CARDIAC CATHETERIZATION N/A 02/14/2016   Procedure: Coronary Stent Intervention-LAD and CFX;  Surgeon: Runell Gess, MD;  Location: [facility] INVASIVE CV LAB;  Service: Cardiovascular;  Laterality: N/A;   COLONOSCOPY WITH PROPOFOL N/A 05/06/2015   Procedure: COLONOSCOPY WITH PROPOFOL;  Surgeon: Wallace Cullens, MD;  Location: [facility] ENDOSCOPY;  Service: Gastroenterology;  Laterality: N/A;   CORONARY ANGIOPLASTY WITH STENT PLACEMENT  02/14/2016   "2 stents"   ESOPHAGOGASTRODUODENOSCOPY (EGD) WITH PROPOFOL N/A 05/06/2015   Procedure: ESOPHAGOGASTRODUODENOSCOPY (EGD) WITH PROPOFOL;  Surgeon: Wallace Cullens, MD;  Location: [facility] ENDOSCOPY;  Service: Gastroenterology;  Laterality: N/A;   FOREARM FRACTURE SURGERY Right 1985   "got it tore up in [place]"   FRACTURE SURGERY      Allergies: Patient has no known allergies.  Medications: Prior to Admission medications   Medication Sig Start Date End Date Taking? Authorizing Provider  aspirin EC 81 MG tablet Take 81 mg by mouth daily.   Yes [provider]  atorvastatin (LIPITOR) 20 MG tablet Take 1 tablet (20 mg total) by mouth daily. 11/21/22  Yes Runell Gess, MD  balsalazide (COLAZAL) 750 MG capsule Take 2,250 mg by mouth 3 (three) times daily. 09/23/19  Yes [provider]  clopidogrel (PLAVIX) 75 MG tablet  Take 1 tablet (75 mg total) by mouth daily. 11/21/22  Yes Runell Gess, MD  escitalopram (LEXAPRO) 5 MG tablet Take 5 mg by mouth daily. 08/29/19  Yes [provider]  ferrous sulfate 325 (65 FE) MG tablet Take 325 mg by mouth daily with breakfast.   Yes [provider]  galantamine (RAZADYNE) 12 MG tablet Take 12  mg by mouth 2 (two) times daily with a meal. 09/02/19  Yes [provider]  lisinopril (ZESTRIL) 10 MG tablet Take 1 tablet (10 mg total) by mouth daily. 11/21/22  Yes Runell Gess, MD  memantine (NAMENDA) 10 MG tablet Take 10 mg by mouth 2 (two) times daily. 08/07/19  Yes [provider]  metFORMIN (GLUCOPHAGE) 500 MG tablet Take 500 mg by mouth 2 (two) times daily with a meal.   Yes [provider]  nitroGLYCERIN (NITROSTAT) 0.4 MG SL tablet Place 1 tablet (0.4 mg total) under the tongue every 5 (five) minutes as needed for chest pain. 02/15/16  Yes Janetta Hora, PA-C  ondansetron (ZOFRAN) 4 MG tablet Take 1 tablet (4 mg total) by mouth every 6 (six) hours as needed for up to 7 days for nausea or vomiting. 06/29/23 07/06/23 Yes Ray, Danie Binder, MD  vitamin B-12 (CYANOCOBALAMIN) 1000 MCG tablet Take 1,000 mcg by mouth daily.   Yes [provider]  metoprolol tartrate (LOPRESSOR) 25 MG tablet Hold until followup with outpatient provider since heart rate was low without it. Patient not taking: Reported on 07/05/2023 11/20/21   Darlin Priestly, MD  omeprazole (PRILOSEC) 20 MG capsule Take 20 mg by mouth daily. Patient not taking: Reported on 07/05/2023    [provider]     Family History  Problem Relation Age of Onset   Prostate cancer Father    Bladder Cancer Neg Hx    Kidney cancer Neg Hx     Social History   Socioeconomic History   Marital status: Married    Spouse name: Not on file   Number of children: Not on file   Years of education: Not on file   Highest education level: Not on file  Occupational History   Not on file  Tobacco Use   Smoking status: Former    Current packs/day: 0.00    Average packs/day: 2.0 packs/day for 30.0 years (60.0 ttl pk-yrs)    Types: Cigarettes    Start date: 70    Quit date: 13    Years since quitting: 35.3   Smokeless tobacco: Never  Substance and Sexual Activity   Alcohol use: No    Alcohol/week: 0.0  standard drinks of alcohol   Drug use: No   Sexual activity: Never  Other Topics Concern   Not on file  Social History Narrative   Not on file   Social Drivers of Health   Financial Resource Strain: Low Risk  (07/13/2022)   Received from [facility], [facility]   Overall Financial Resource Strain (CARDIA)    Difficulty of Paying Living Expenses: Not hard at all  Food Insecurity: No Food Insecurity (07/13/2022)   Received from [facility], [facility]   Hunger Vital Sign    Worried About Running Out of Food in the Last Year: Never true    Ran Out of Food in the Last Year: Never true  Transportation Needs: No Transportation Needs (07/13/2022)   Received from [facility], [facility]   PRAPARE -  Transportation    In the past 12 months, has lack of transportation kept you from medical appointments or from getting medications?: No    Lack of Transportation (Non-Medical): No  Physical Activity: Not on file  Stress: Not on file  Social Connections: Not on file     Review of Systems  Reason unable to perform ROS: Patient is nonverbal.   Vital Signs: BP (!) 113/59   Pulse 78   Temp 98.7 F (37.1 C) (Oral)   Resp 17   Ht 5\' 10"  (1.778 m)   Wt 240 lb (108.9 kg)   SpO2 96%   BMI 34.44 kg/m    Physical Exam Vitals reviewed.  HENT:     Head: Normocephalic and atraumatic.     Mouth/Throat:     Mouth: Mucous membranes are moist.     Dentition: Has dentures (upper dentures in place and removed by wife).     Pharynx: Oropharynx is clear.     Comments: Airway is patent, but obstructed by tongue; unable to open mouth fully Cardiovascular:     Rate and Rhythm: Normal rate.  Pulmonary:     Effort: Pulmonary effort is normal.  Abdominal:     General: Abdomen is flat.     Palpations: Abdomen is soft.     Tenderness: There is guarding (RUQ).  Musculoskeletal:         General: Normal range of motion.     Cervical back: Normal range of motion and neck supple.  Skin:    General: Skin is warm and dry.  Neurological:     Mental Status: Mental status is at baseline.     Imaging: CT CHEST ABDOMEN PELVIS W CONTRAST Result Date: 07/05/2023 CLINICAL DATA:  Vomiting, abdominal pain, and sepsis. EXAM: CT CHEST, ABDOMEN, AND PELVIS WITH CONTRAST TECHNIQUE: Multidetector CT imaging of the chest, abdomen and pelvis was performed following the standard protocol during bolus administration of intravenous contrast. RADIATION DOSE REDUCTION: This exam was performed according to the departmental dose-optimization program which includes automated exposure control, adjustment of the mA and/or kV according to patient size and/or use of iterative reconstruction technique. CONTRAST:  80mL OMNIPAQUE IOHEXOL 300 MG/ML  SOLN COMPARISON:  Portable chest today, abdomen and pelvis CT with contrast 06/29/2023, and chest, abdomen and pelvis CT with IV contrast 11/16/2021. FINDINGS: CT CHEST FINDINGS Cardiovascular: There is mild cardiomegaly. Three-vessel coronary artery calcifications with prior LAD stenting. There are patchy calcific plaques in the thoracic aorta, scattered calcification in the great vessels. No aneurysm, dissection or stenosis. There are calcifications and thickening of the aortic valve leaflets. Consider echocardiographic follow-up to assess valvular function. There is no pericardial effusion. The pulmonary arteries and veins are normal in caliber. The pulmonary arteries are centrally clear. Mediastinum/Nodes: Chronic mediastinal lipomatosis. No adenopathy. Hiatal fat hernia. Lungs/Pleura: Chronic left-sided volume loss and posterior basal atelectasis. There is respiratory motion. Mild posterior atelectasis on the right. No consolidation or nodule is seen through the breathing motion. No effusion. Trachea and central airways are clear Musculoskeletal: 1 cm bone island again noted  T1 vertebral body. No acute or other significant osseous findings. Degenerative disc disease and spondylosis mid to lower thoracic spine. The ribcage is intact. Small amount of gynecomastia noted bilaterally. CT ABDOMEN PELVIS FINDINGS Hepatobiliary: The liver is 24 cm in length and moderately steatotic. There is no mass enhancement. New compared with 6 days ago, there is increased gallbladder wall thickening, pericholecystic fluid and pericholecystic edema. Findings are most likely due to acute  cholecystitis. There is a single 1 cm partially calcified stone in the proximal gallbladder. There are no other visible stones. There is no intrahepatic or extrahepatic bile duct dilatation. Pancreas: Partially atrophic.  Otherwise unremarkable. Spleen: Unremarkable. Adrenals/Urinary Tract: There is no adrenal or renal mass enhancement. There are 2 punctate nonobstructive caliceal stones in the superior pole of the right kidney. No left nephrolithiasis is seen and no ureteral stones or hydronephrosis bilaterally. The bladder is contracted and not well seen. Stomach/Bowel: No dilatation or wall thickening including the appendix. Scattered colonic diverticula without diverticulitis. Vascular/Lymphatic: Aortic atherosclerosis. No enlarged abdominal or pelvic lymph nodes. Reproductive: No prostatomegaly. Other: No pelvic ascites. Trace reactive fluid right paracolic gutter. No free air, free hemorrhage or abscess. Small umbilical and bilateral inguinal fat hernias. Musculoskeletal: Degenerative change and mild dextroscoliosis lumbar spine. Mild hip DJD. No acute or significant regional osseous findings. IMPRESSION: 1. No acute chest CT findings. 2. Cardiomegaly with aortic and coronary artery atherosclerosis. 3. Calcifications and thickening of the aortic valve leaflets. Consider echocardiographic follow-up to assess valvular function. 4. Hiatal fat hernia. 5. Chronic left-sided volume loss and posterior basal atelectasis. 6.  Cholelithiasis with increased gallbladder wall thickening, pericholecystic fluid and pericholecystic edema, most likely due to acute cholecystitis. 7. Hepatomegaly and steatosis. 8. Nonobstructive right micronephrolithiasis. 9. Diverticulosis without evidence of diverticulitis. 10. Umbilical and inguinal fat hernias. 11. Critical Value/emergent results were called by telephone at the time of interpretation on 07/05/2023 at 4:22 am to provider [facility] , who verbally acknowledged these results. Electronically Signed   By: Denman Fischer M.D.   On: 07/05/2023 04:36   DG Chest Port 1 View Result Date: 07/05/2023 CLINICAL DATA:  Possible sepsis EXAM: PORTABLE CHEST 1 VIEW COMPARISON:  06/28/2023 FINDINGS: Stable cardiomegaly. Lungs are hypoinflated but clear. No bony abnormality is noted. IMPRESSION: Unchanged from the prior exam. Electronically Signed   By: Violeta Grey M.D.   On: 07/05/2023 02:08   CT ABDOMEN PELVIS W CONTRAST Result Date: 06/29/2023 CLINICAL DATA:  Increased lethargy. EXAM: CT ABDOMEN AND PELVIS WITH CONTRAST TECHNIQUE: Multidetector CT imaging of the abdomen and pelvis was performed using the standard protocol following bolus administration of intravenous contrast. RADIATION DOSE REDUCTION: This exam was performed according to the departmental dose-optimization program which includes automated exposure control, adjustment of the mA and/or kV according to patient size and/or use of iterative reconstruction technique. CONTRAST:  OMNIPAQUE IOHEXOL 350 MG/ML SOLN COMPARISON:  November 16, 2021 FINDINGS: Lower chest: Mild to moderate severity scarring and/or atelectasis is seen within the left lung base. Hepatobiliary: There is diffuse fatty infiltration of the liver parenchyma. A punctate gallstone is seen within the gallbladder without evidence of gallbladder wall thickening or biliary dilatation. Pancreas: Unremarkable. No pancreatic ductal dilatation or surrounding inflammatory changes.  Spleen: Normal in size without focal abnormality. Adrenals/Urinary Tract: Adrenal glands are unremarkable. Kidneys are normal in size, without obstructing renal calculi, focal lesion, or hydronephrosis. A punctate nonobstructing renal calculus is seen within the upper pole of the right kidney. Bladder is unremarkable. Stomach/Bowel: There is a small hiatal hernia. Appendix appears normal. No evidence of bowel wall thickening, distention, or inflammatory changes. Vascular/Lymphatic: Aortic atherosclerosis. No enlarged abdominal or pelvic lymph nodes. Reproductive: Prostate is unremarkable. Other: A small, stable fat containing umbilical hernia is seen. No abdominopelvic ascites. Musculoskeletal: Multilevel degenerative changes are seen throughout the lumbar spine. IMPRESSION: 1. Fatty liver. 2. Cholelithiasis. 3. Punctate nonobstructing right renal calculus. 4. Small hiatal hernia. 5. Small, stable fat containing umbilical  hernia. 6. Aortic atherosclerosis. Electronically Signed   By: Virgle Grime M.D.   On: 06/29/2023 03:40   DG Chest Port 1 View Result Date: 06/28/2023 CLINICAL DATA:  Lethargy EXAM: PORTABLE CHEST 1 VIEW COMPARISON:  11/16/2021 FINDINGS: Low lung volumes. Cardiomegaly with aortic atherosclerosis. No consolidation, pleural effusion, or pneumothorax IMPRESSION: Low lung volumes. Cardiomegaly. Electronically Signed   By: Esmeralda Hedge M.D.   On: 06/28/2023 23:19    Labs:  CBC: Recent Labs    06/28/23 2057 07/05/23 0145  WBC 14.4* 27.1*  HGB 13.6 11.9*  HCT 41.7 36.7*  PLT 329 346    COAGS: Recent Labs    06/28/23 2057 07/05/23 0145  INR 1.0 1.2    BMP: Recent Labs    06/28/23 2057 07/05/23 0145  NA 130* 132*  K 4.2 3.9  CL 97* 96*  CO2 22 22  GLUCOSE 166* 215*  BUN 14 20  CALCIUM 8.6* 8.4*  CREATININE 0.82 1.78*  GFRNONAA >60 39*    LIVER FUNCTION TESTS: Recent Labs    06/28/23 2057 07/05/23 0145  BILITOT 0.7 1.1  AST 35 23  ALT 26 15  ALKPHOS 92  72  PROT 7.9 7.3  ALBUMIN 3.8 2.9*    TUMOR MARKERS: No results for input(s): "AFPTM", "CEA", "CA199", "CHROMGRNA" in the last 8760 hours.  Assessment and Plan:  Cholelithiasis with concerns for acute cholecystitis: CHANE COWDEN is a 78 y.o. male with a history of advanced dementia nonverbal at baseline, STEMI on ASA and Plavix, and acute cholecystitis who presents to [facility] Interventional Radiology department for an image-guided percutaneous cholecystostomy with Dr. Irine Manning on 07/05/23. Procedure to be performed under moderate sedation.  -Patient assessed with Dr. Irine Manning at the bedside. Do not feel that the patient can appropriately protect his airway  -Do not feel that anesthesia is a good option for this patient either -Will plan for local only  -ASA and Lovenox held this morning  Risks and benefits discussed with the patient including bleeding, infection, damage to adjacent structures, bowel perforation/fistula connection, and sepsis.  All of the patient's questions were answered, patient is agreeable to proceed. Consent signed and in IR.  Thank you for allowing our service to participate in Jeff Key 's care.    Electronically Signed: Orson Blalock, PA-C   07/05/2023, 11:41 AM     I spent a total of 55 Mi[place]  in face to face in clinical consultation, greater than 50% of which was counseling/coordinating care for percutaneous cholecystostomy tube placement.

## 2023-07-05 NOTE — IPAL (Signed)
  Interdisciplinary Goals of Care Family Meeting   Date carried out: 07/05/2023  Location of the meeting: Bedside  Member's involved: Physician and Family Member or next of kin  Durable Power of Attorney or Environmental health practitioner: wife at bedside    Discussion: We discussed goals of care for [employer] .   I have reviewed medical records including EPIC notes, labs and imaging, assessed the patient and then met with wife to discuss major active diagnoses, plan of care, natural trajectory, prognosis, GOC, EOL wishes, disposition and options including Full code/DNI/DNR and the concept of comfort care if DNR is elected. Questions and concerns were addressed. They are  in agreement to continue current plan of care . Election for full code status.   Code status: full code  Disposition: Continue current acute care  Time spent for the meeting: 25    Lanetta Pion, MD  07/05/2023, 6:25 AM

## 2023-07-05 NOTE — ED Triage Notes (Signed)
 Pt arrives via EMS from home - family concerned pt has not been acting his normal self which baseline is nonverbal from hx Alzheimers/ dementia and chairbound - family states he has been having some episodes of vomiting and "looks like he is in pain" also noted night sweats, high HR and low BP at home. On arrival pt is alertt, nonverbal with questions, does not follow commands, moves extremities, withdrawals from pain - HR tachy, soft BP.

## 2023-07-05 NOTE — Progress Notes (Signed)
*  PRELIMINARY RESULTS* Echocardiogram 2D Echocardiogram has been performed.  Jeff Key 07/05/2023, 10:19 AM

## 2023-07-06 ENCOUNTER — Inpatient Hospital Stay

## 2023-07-06 DIAGNOSIS — K810 Acute cholecystitis: Secondary | ICD-10-CM | POA: Diagnosis not present

## 2023-07-06 DIAGNOSIS — A419 Sepsis, unspecified organism: Secondary | ICD-10-CM | POA: Diagnosis not present

## 2023-07-06 DIAGNOSIS — R6520 Severe sepsis without septic shock: Secondary | ICD-10-CM | POA: Diagnosis not present

## 2023-07-06 LAB — COMPREHENSIVE METABOLIC PANEL WITH GFR
ALT: 14 U/L (ref 0–44)
AST: 19 U/L (ref 15–41)
Albumin: 2.5 g/dL — ABNORMAL LOW (ref 3.5–5.0)
Alkaline Phosphatase: 57 U/L (ref 38–126)
Anion gap: 9 (ref 5–15)
BUN: 17 mg/dL (ref 8–23)
CO2: 23 mmol/L (ref 22–32)
Calcium: 8.1 mg/dL — ABNORMAL LOW (ref 8.9–10.3)
Chloride: 103 mmol/L (ref 98–111)
Creatinine, Ser: 1.02 mg/dL (ref 0.61–1.24)
GFR, Estimated: >60 mL/min (ref >60)
Glucose, Bld: 147 mg/dL — ABNORMAL HIGH (ref 70–99)
Potassium: 3.3 mmol/L — ABNORMAL LOW (ref 3.5–5.1)
Sodium: 135 mmol/L (ref 135–145)
Total Bilirubin: 0.9 mg/dL (ref 0.0–1.2)
Total Protein: 6.5 g/dL (ref 6.5–8.1)

## 2023-07-06 LAB — URINE CULTURE: Culture: NO GROWTH

## 2023-07-06 LAB — GLUCOSE, CAPILLARY
Glucose-Capillary: 155 mg/dL — ABNORMAL HIGH (ref 70–99)
Glucose-Capillary: 148 mg/dL — ABNORMAL HIGH (ref 70–99)
Glucose-Capillary: 153 mg/dL — ABNORMAL HIGH (ref 70–99)
Glucose-Capillary: 143 mg/dL — ABNORMAL HIGH (ref 70–99)
Glucose-Capillary: 148 mg/dL — ABNORMAL HIGH (ref 70–99)
Glucose-Capillary: 141 mg/dL — ABNORMAL HIGH (ref 70–99)
Glucose-Capillary: 124 mg/dL — ABNORMAL HIGH (ref 70–99)

## 2023-07-06 LAB — CBC
HCT: 30.9 % — ABNORMAL LOW (ref 39.0–52.0)
Hemoglobin: 10.3 g/dL — ABNORMAL LOW (ref 13.0–17.0)
MCH: 28.9 pg (ref 26.0–34.0)
MCHC: 33.3 g/dL (ref 30.0–36.0)
MCV: 86.8 fL (ref 80.0–100.0)
Platelets: 326 10*3/uL (ref 150–400)
RBC: 3.56 MIL/uL — ABNORMAL LOW (ref 4.22–5.81)
RDW: 13.5 % (ref 11.5–15.5)
WBC: 17.5 10*3/uL — ABNORMAL HIGH (ref 4.0–10.5)
nRBC: 0 % (ref 0.0–0.2)

## 2023-07-06 MED ORDER — POTASSIUM CHLORIDE 10 MEQ/100ML IV SOLN
10.0000 meq | INTRAVENOUS | Status: AC
  Administered 2023-07-06 (×4): 10 meq via INTRAVENOUS
  Filled 2023-07-06 (×4): qty 100

## 2023-07-06 MED ORDER — ACETAMINOPHEN 325 MG PO TABS
650.0000 mg | ORAL_TABLET | Freq: Four times a day (QID) | ORAL | Status: DC | PRN
  Administered 2023-07-06: 650 mg via ORAL
  Filled 2023-07-06: qty 2

## 2023-07-06 NOTE — Plan of Care (Signed)
  Problem: Safety: Goal: Ability to remain free from injury will improve Outcome: Progressing   Problem: Clinical Measurements: Goal: Ability to maintain clinical measurements within normal limits will improve Outcome: Progressing Goal: Will remain free from infection Outcome: Progressing Goal: Respiratory complications will improve Outcome: Progressing Goal: Cardiovascular complication will be avoided Outcome: Progressing   Problem: Elimination: Goal: Will not experience complications related to bowel motility Outcome: Progressing Goal: Will not experience complications related to urinary retention Outcome: Progressing

## 2023-07-06 NOTE — Progress Notes (Signed)
 Subjective:  CC: Jeff Key is a 78 y.o. male  Hospital stay day 1,   acute cholecystitis  HPI: Status post IR guided drainage yesterday.  No concerns.  ROS:  Pertinent positives and negatives noted in the HPI.    Objective:   Temp:  [97.9 F (36.6 C)-100.4 F (38 C)] 99.7 F (37.6 C) (04/18 0809) Pulse Rate:  [0-95] 84 (04/18 0809) Resp:  [12-27] 16 (04/18 0809) BP: (114-143)/(61-127) 133/64 (04/18 0809) SpO2:  [90 %-98 %] 90 % (04/18 0809) Weight:  [108.9 kg] 108.9 kg (04/17 1452)     Height: 5\' 10"  (177.8 cm) Weight: 108.9 kg BMI (Calculated): 34.45   Intake/Output this shift:   Intake/Output Summary (Last 24 hours) at 07/06/2023 1117 Last data filed at 07/06/2023 4098 Gross per 24 hour  Intake 5 ml  Output 540 ml  Net -535 ml    Constitutional :  no distress  Respiratory:  Clear to auscultation bilaterally  Cardiovascular:  Regular rate and rhythm  Gastrointestinal: Soft, no guarding, possible increased distention compared to yesterday? .   Skin: Cool and moist.   Psychiatric: Normal affect, non-agitated, not confused       LABS:     Latest Ref Rng & Units 07/06/2023    5:05 AM 07/05/2023    1:45 AM 06/28/2023    8:57 PM  CMP  Glucose 70 - 99 mg/dL 119  147  829   BUN 8 - 23 mg/dL 17  20  14    Creatinine 0.61 - 1.24 mg/dL 5.62  1.30  8.65   Sodium 135 - 145 mmol/L 135  132  130   Potassium 3.5 - 5.1 mmol/L 3.3  3.9  4.2   Chloride 98 - 111 mmol/L 103  96  97   CO2 22 - 32 mmol/L 23  22  22    Calcium  8.9 - 10.3 mg/dL 8.1  8.4  8.6   Total Protein 6.5 - 8.1 g/dL 6.5  7.3  7.9   Total Bilirubin 0.0 - 1.2 mg/dL 0.9  1.1  0.7   Alkaline Phos 38 - 126 U/L 57  72  92   AST 15 - 41 U/L 19  23  35   ALT 0 - 44 U/L 14  15  26        Latest Ref Rng & Units 07/06/2023    5:05 AM 07/05/2023    1:45 AM 06/28/2023    8:57 PM  CBC  WBC 4.0 - 10.5 K/uL 17.5  27.1  14.4   Hemoglobin 13.0 - 17.0 g/dL 78.4  69.6  29.5   Hematocrit 39.0 - 52.0 % 30.9  36.7  41.7    Platelets 150 - 400 K/uL 326  346  329     RADS: N/A Assessment:   Acute cholecystitis, status post IR guided cholecystectomy due to anticoagulation and multiple comorbidities.  Leukocytosis improving.  Wife does not report any flatus or bowel movements past couple days.  Abdomen seems slightly more distended so we will obtain abdominal x-ray to make sure no signs of possibly developing ileus from the sepsis secondary to the acute cholecystitis prior to resuming diet.  Due to patient's baseline mentation, will continue to monitor closely  labs/images/medications/previous chart entries reviewed personally and relevant changes/updates noted above.

## 2023-07-06 NOTE — Progress Notes (Addendum)
 PROGRESS NOTE    Jeff Key  FMW:[id number] DOB: 1946-02-28 DOA: 07/05/2023 PCP: Sadie Manna, MD  Chief Complaint  Patient presents with   Emesis    Hospital Course:  Jeff Key is 78 y.o. male with advanced dementia, nonverbal at baseline, CAD, ischemic cardiomyopathy, chronic heart failure reduced EF, chronic ulcerative proctosigmoiditis, insulin -dependent diabetes, obesity, who was brought in by family member due to worsening GI symptoms with nausea, vomiting, fever, abdominal pain.  Workup in ED revealed stable vital signs, increased lactic acidosis of 3.5, creatinine 1.7, leukocytosis 27, CT abdomen pelvis with signs of acute cholecystitis.  Patient was started on broad-spectrum antibiotics and given a 2 L IV bolus.  General surgery was consulted and recommended cholecystotomy tube given patient has been on antiplatelet therapy  Subjective: On evaluation this morning patient's drain is in place.  His wife is at bedside.  Patient is nonverbal and does not participate in exam but his wife reports he is at his baseline and does not appear to have any pain.  Patient's wife denies any flatus or bowel movements.  She is requesting food.  Objective: Vitals:   07/05/23 1833 07/05/23 1946 07/05/23 2318 07/06/23 0357  BP: 134/77 130/68 123/74 124/63  Pulse: 71 84 81 87  Resp:  20 20 20   Temp: (!) 100.4 F (38 C) 98.5 F (36.9 C) 99 F (37.2 C) 100 F (37.8 C)  TempSrc:      SpO2: 94% 92% 91% 91%  Weight:      Height:        Intake/Output Summary (Last 24 hours) at 07/06/2023 0800 Last data filed at 07/06/2023 0401 Gross per 24 hour  Intake 5 ml  Output 440 ml  Net -435 ml   Filed Weights   07/05/23 0124 07/05/23 1452  Weight: 108.9 kg 108.9 kg    Examination: General exam: Appears calm and comfortable, NAD  Respiratory system: No work of breathing, symmetric chest wall expansion Cardiovascular system: S1 & S2 heard, RRR.  Gastrointestinal system: Abdomen is  distended, drain in place with serosanguineous fluid, hypoactive bowel sounds Neuro: Alert, nonverbal, does not participate in exam or follow commands Skin: No rashes, lesions Psychiatry: calm, cannot assess otherwise  Assessment & Plan:  Principal Problem:   Severe sepsis (HCC) Active Problems:   Sepsis (HCC)   Acute cholecystitis    Sepsis with acute endorgan damage Acute cholecystitis - Sepsis criteria met with: Leukocytosis, lactic acidosis, AKI endorgan damage, infection: Acute cholecystitis - Status post IV fluids in the ED - Continue with Zosyn  - Per general surgery recommending cholecystotomy tube and IV antibiotics and postponing surgery for now.  Hold Plavix  - Cholecystotomy tube placed with IR 1/17.  Draining appropriately now - Leukocytosis downtrending - KUB performed today, high risk of ileus.  General surgery has recommended clear liquid diet - continue serial abdominal exams, low threshold for reimaging  AKI - Secondary to sepsis - Fluid resuscitation as above - Trend CMP, resolving now  Hypokalemia - Replace as needed  History of CAD Ischemic cardiomyopathy Chronic heart failure reduced EF - Clinically euvolemic at this time - Echo: LVEF 55 to 60%, grade 1 diastolic dysfunction.  Dilation of aortic root, - Status post 3 L IV fluids in the ED.  Hold off on maintenance IV fluids for now - GDMT as BP tolerates - Troponin slightly elevated, patient does not appear to have chest pain.  Echo as above  Hypertension - Resume as BP tolerates  Insulin -dependent diabetes -  Sliding scale insulin  with basal/bolus titration as needed-hemoglobin A1c 7.2%  Obesity BMI 34 - Complicates the above care plan given necessity of surgical intervention - Outpatient follow up for lifestyle modification and risk factor management  Advanced dementia - Family reports current mentation is at baseline.  Patient is nonverbal. - Continue memantadine  DVT prophylaxis: SCDs    Code Status: Full Code Disposition: Inpatient, still hospitalized for sepsis management.  Currently on IV antibiotics with perc chole tube.  Trending CBC.   Consultants:    Procedures:  Cystostomy tube 4/16  Antimicrobials:  Anti-infectives (From admission, onward)    Start     Dose/Rate Route Frequency Ordered Stop   07/05/23 1130  cefOXitin  (MEFOXIN ) 2 g in sodium chloride  0.9 % 100 mL IVPB        2 g 200 mL/hr over 30 Minutes Intravenous To Radiology 07/05/23 1118 07/05/23 1659   07/05/23 0900  piperacillin -tazobactam (ZOSYN ) IVPB 3.375 g        3.375 g 12.5 mL/hr over 240 Minutes Intravenous Every 8 hours 07/05/23 0805     07/05/23 0230  ceFEPIme  (MAXIPIME ) 2 g in sodium chloride  0.9 % 100 mL IVPB        2 g 200 mL/hr over 30 Minutes Intravenous  Once 07/05/23 0219 07/05/23 0303   07/05/23 0230  metroNIDAZOLE  (FLAGYL ) IVPB 500 mg        500 mg 100 mL/hr over 60 Minutes Intravenous  Once 07/05/23 0219 07/05/23 0347   07/05/23 0230  vancomycin  (VANCOREADY) IVPB 2000 mg/400 mL        2,000 mg 200 mL/hr over 120 Minutes Intravenous  Once 07/05/23 0227 07/05/23 0616       Data Reviewed: I have personally reviewed following labs and imaging studies CBC: Recent Labs  Lab 07/05/23 0145 07/06/23 0505  WBC 27.1* 17.5*  NEUTROABS 22.5*  --   HGB 11.9* 10.3*  HCT 36.7* 30.9*  MCV 89.3 86.8  PLT 346 326   Basic Metabolic Panel: Recent Labs  Lab 07/05/23 0145 07/06/23 0505  NA 132* 135  K 3.9 3.3*  CL 96* 103  CO2 22 23  GLUCOSE 215* 147*  BUN 20 17  CREATININE 1.78* 1.02  CALCIUM  8.4* 8.1*   GFR: Estimated Creatinine Clearance: 73.8 mL/min (by C-G formula based on SCr of 1.02 mg/dL). Liver Function Tests: Recent Labs  Lab 07/05/23 0145 07/06/23 0505  AST 23 19  ALT 15 14  ALKPHOS 72 57  BILITOT 1.1 0.9  PROT 7.3 6.5  ALBUMIN 2.9* 2.5*   CBG: Recent Labs  Lab 07/05/23 1234 07/05/23 1500 07/05/23 1624 07/05/23 1722 07/05/23 2032  GLUCAP 148* 155*  148* 150* 140*    Recent Results (from the past 240 hours)  Blood Culture (routine x 2)     Status: None   Collection Time: 06/28/23  8:56 PM   Specimen: BLOOD  Result Value Ref Range Status   Specimen Description BLOOD BLOOD LEFT HAND  Final   Special Requests   Final    BOTTLES DRAWN AEROBIC AND ANAEROBIC Blood Culture adequate volume   Culture   Final    NO GROWTH 5 DAYS Performed at [facility], [address] [place], [place] [address]    Report Status 07/03/2023 FINAL  Final  Blood Culture (routine x 2)     Status: None   Collection Time: 06/28/23  8:57 PM   Specimen: BLOOD  Result Value Ref Range Status   Specimen Description BLOOD BLOOD RIGHT  ARM  Final   Special Requests   Final    BOTTLES DRAWN AEROBIC AND ANAEROBIC Blood Culture adequate volume   Culture   Final    NO GROWTH 5 DAYS Performed at [facility], [address] [place], [place] [address]    Report Status 07/03/2023 FINAL  Final  Resp panel by RT-PCR (RSV, Flu A&B, Covid) Anterior Nasal Swab     Status: None   Collection Time: 06/28/23  9:15 PM   Specimen: Anterior Nasal Swab  Result Value Ref Range Status   SARS Coronavirus 2 by RT PCR NEGATIVE NEGATIVE Final    Comment: (NOTE) SARS-CoV-2 target nucleic acids are NOT DETECTED.  The SARS-CoV-2 RNA is generally detectable in upper respiratory specimens during the acute phase of infection. The lowest concentration of SARS-CoV-2 viral copies this assay can detect is 138 copies/mL. A negative result does not preclude SARS-Cov-2 infection and should not be used as the sole basis for treatment or other patient management decisions. A negative result may occur with  improper specimen collection/handling, submission of specimen other than nasopharyngeal swab, presence of viral mutation(s) within the areas targeted by this assay, and inadequate number of viral copies(<138 copies/mL). A negative result must be combined with clinical  observations, patient history, and epidemiological information. The expected result is Negative.  Fact Sheet for Patients:  bloggercourse.com  Fact Sheet for Healthcare Providers:  seriousbroker.it  This test is no t yet approved or cleared by the [place]  FDA and  has been authorized for detection and/or diagnosis of SARS-CoV-2 by FDA under an Emergency Use Authorization (EUA). This EUA will remain  in effect (meaning this test can be used) for the duration of the COVID-19 declaration under Section 564(b)(1) of the Act, 21 U.S.C.section 360bbb-3(b)(1), unless the authorization is terminated  or revoked sooner.       Influenza A by PCR NEGATIVE NEGATIVE Final   Influenza B by PCR NEGATIVE NEGATIVE Final    Comment: (NOTE) The Xpert Xpress SARS-CoV-2/FLU/RSV plus assay is intended as an aid in the diagnosis of influenza from Nasopharyngeal swab specimens and should not be used as a sole basis for treatment. Nasal washings and aspirates are unacceptable for Xpert Xpress SARS-CoV-2/FLU/RSV testing.  Fact Sheet for Patients: bloggercourse.com  Fact Sheet for Healthcare Providers: seriousbroker.it  This test is not yet approved or cleared by the [place]  FDA and has been authorized for detection and/or diagnosis of SARS-CoV-2 by FDA under an Emergency Use Authorization (EUA). This EUA will remain in effect (meaning this test can be used) for the duration of the COVID-19 declaration under Section 564(b)(1) of the Act, 21 U.S.C. section 360bbb-3(b)(1), unless the authorization is terminated or revoked.     Resp Syncytial Virus by PCR NEGATIVE NEGATIVE Final    Comment: (NOTE) Fact Sheet for Patients: bloggercourse.com  Fact Sheet for Healthcare Providers: seriousbroker.it  This test is not yet approved or cleared by  the [place]  FDA and has been authorized for detection and/or diagnosis of SARS-CoV-2 by FDA under an Emergency Use Authorization (EUA). This EUA will remain in effect (meaning this test can be used) for the duration of the COVID-19 declaration under Section 564(b)(1) of the Act, 21 U.S.C. section 360bbb-3(b)(1), unless the authorization is terminated or revoked.  Performed at [facility], [address] [place], [place] [address]   Blood Culture (routine x 2)     Status: None (Preliminary result)   Collection Time: 07/05/23  1:25 AM  Specimen: BLOOD RIGHT ARM  Result Value Ref Range Status   Specimen Description   Final    BLOOD RIGHT ARM Performed at [facility], [address] [place], [place] [address]    Special Requests   Final    BOTTLES DRAWN AEROBIC AND ANAEROBIC Blood Culture adequate volume   Culture  Setup Time PENDING  Incomplete   Culture   Final    NO GROWTH 1 DAY Performed at [facility], [address] [place], [place] [address]    Report Status PENDING  Incomplete  Blood Culture (routine x 2)     Status: None (Preliminary result)   Collection Time: 07/05/23  1:25 AM   Specimen: BLOOD  Result Value Ref Range Status   Specimen Description BLOOD LAC  Final   Special Requests   Final    BOTTLES DRAWN AEROBIC AND ANAEROBIC Blood Culture adequate volume   Culture   Final    NO GROWTH 1 DAY Performed at [facility], [address], [place], [place] [address]    Report Status PENDING  Incomplete  Resp panel by RT-PCR (RSV, Flu A&B, Covid) Anterior Nasal Swab     Status: None   Collection Time: 07/05/23  1:48 AM   Specimen: Anterior Nasal Swab  Result Value Ref Range Status   SARS Coronavirus 2 by RT PCR NEGATIVE NEGATIVE Final    Comment: (NOTE) SARS-CoV-2 target nucleic acids are NOT DETECTED.  The SARS-CoV-2 RNA is generally detectable in upper respiratory specimens during the acute phase of infection. The  lowest concentration of SARS-CoV-2 viral copies this assay can detect is 138 copies/mL. A negative result does not preclude SARS-Cov-2 infection and should not be used as the sole basis for treatment or other patient management decisions. A negative result may occur with  improper specimen collection/handling, submission of specimen other than nasopharyngeal swab, presence of viral mutation(s) within the areas targeted by this assay, and inadequate number of viral copies(<138 copies/mL). A negative result must be combined with clinical observations, patient history, and epidemiological information. The expected result is Negative.  Fact Sheet for Patients:  bloggercourse.com  Fact Sheet for Healthcare Providers:  seriousbroker.it  This test is no t yet approved or cleared by the [place]  FDA and  has been authorized for detection and/or diagnosis of SARS-CoV-2 by FDA under an Emergency Use Authorization (EUA). This EUA will remain  in effect (meaning this test can be used) for the duration of the COVID-19 declaration under Section 564(b)(1) of the Act, 21 U.S.C.section 360bbb-3(b)(1), unless the authorization is terminated  or revoked sooner.       Influenza A by PCR NEGATIVE NEGATIVE Final   Influenza B by PCR NEGATIVE NEGATIVE Final    Comment: (NOTE) The Xpert Xpress SARS-CoV-2/FLU/RSV plus assay is intended as an aid in the diagnosis of influenza from Nasopharyngeal swab specimens and should not be used as a sole basis for treatment. Nasal washings and aspirates are unacceptable for Xpert Xpress SARS-CoV-2/FLU/RSV testing.  Fact Sheet for Patients: bloggercourse.com  Fact Sheet for Healthcare Providers: seriousbroker.it  This test is not yet approved or cleared by the [place]  FDA and has been authorized for detection and/or diagnosis of SARS-CoV-2 by FDA under  an Emergency Use Authorization (EUA). This EUA will remain in effect (meaning this test can be used) for the duration of the COVID-19 declaration under Section 564(b)(1) of the Act, 21 U.S.C. section 360bbb-3(b)(1), unless the authorization is terminated or revoked.  Resp Syncytial Virus by PCR NEGATIVE NEGATIVE Final    Comment: (NOTE) Fact Sheet for Patients: bloggercourse.com  Fact Sheet for Healthcare Providers: seriousbroker.it  This test is not yet approved or cleared by the [place]  FDA and has been authorized for detection and/or diagnosis of SARS-CoV-2 by FDA under an Emergency Use Authorization (EUA). This EUA will remain in effect (meaning this test can be used) for the duration of the COVID-19 declaration under Section 564(b)(1) of the Act, 21 U.S.C. section 360bbb-3(b)(1), unless the authorization is terminated or revoked.  Performed at [facility], [address] [place], [place] [address]   Aerobic/Anaerobic Culture w Gram Stain (surgical/deep wound)     Status: None (Preliminary result)   Collection Time: 07/05/23  4:26 PM   Specimen: Abscess  Result Value Ref Range Status   Specimen Description   Final    ABSCESS Performed at [facility], [address], [place], [place] [address]    Special Requests   Final     GALLBLADDER Performed at [facility], [address] [place], [place] [address]    Gram Stain   Final    MODERATE WBC PRESENT,BOTH PMN AND MONONUCLEAR FEW GRAM NEGATIVE RODS Performed at [facility], [address], [place], [place] [address]    Culture PENDING  Incomplete   Report Status PENDING  Incomplete     Radiology Studies: DG Chest 1 View Result Date: 07/06/2023 CLINICAL DATA:  78 year old male with congestive heart failure. EXAM: CHEST  1 VIEW COMPARISON:  CT Chest, Abdomen, and Pelvis 0354 hours today. FINDINGS: Portable AP semi upright  view at 0612 hours. Stable low lung volumes, cardiomegaly and mediastinal contours. No pneumothorax or pulmonary edema. Left lung subpleural opacity better demonstrated by CT and resembling atelectasis or scarring. Stable ventilation from the CT this morning. Visualized tracheal air column is within normal limits. Stable visualized osseous structures. Stable visible bowel gas pattern. IMPRESSION: Stable low lung volumes with subpleural left lung atelectasis or scarring as per CT this morning. No new cardiopulmonary abnormality. Electronically Signed   By: VEAR Hurst M.D.   On: 07/06/2023 06:44   IR Perc Cholecystostomy Result Date: 07/05/2023 INDICATION: 78 year old with sepsis and acute cholecystitis. Patient is not a surgical candidate. EXAM: PLACEMENT OF PERCUTANEOUS CHOLECYSTOSTOMY TUBE USING ULTRASOUND AND FLUOROSCOPIC GUIDANCE MEDICATIONS: Fentanyl  100 mcg ANESTHESIA/SEDATION: The patient's level of consciousness and vital signs were monitored continuously by radiology nursing throughout the procedure under my direct supervision. FLUOROSCOPY TIME:  Radiation Exposure Index (as provided by the fluoroscopic device): 28 mGy Kerma COMPLICATIONS: None immediate. PROCEDURE: Patient has severe dementia and cannot communicate. Informed written consent was obtained from the patient's wife after a thorough discussion of the procedural risks, benefits and alternatives. All questions were addressed. Maximal Sterile Barrier Technique was utilized including caps, mask, sterile gowns, sterile gloves, sterile drape, hand hygiene and skin antiseptic. A timeout was performed prior to the initiation of the procedure. Patient was placed supine on the interventional table. The gallbladder was identified with ultrasound. Ultrasound image was saved for documentation. Right upper abdomen was prepped with chlorhexidine  and sterile field was created. Skin was anesthetized using 1% lidocaine . A small incision was made. Using ultrasound  guidance, a 21 gauge needle was directed into the gallbladder via a transhepatic approach. 0.018 wire was advanced into the gallbladder using fluoroscopic guidance. A transitional dilator set was placed and purulent fluid was aspirated. J wire was placed and the tract was dilated to accommodate  a 10 French multipurpose drain. Approximately 40 mL of purulent fluid was aspirated from the gallbladder. Small amount of contrast was injected to confirm placement as well. Drain was sutured to skin and attached to a gravity bag. Dressing was placed. Fluoroscopic and ultrasound images were taken and saved for documentation. FINDINGS: Mild distention of the gallbladder. Drain was placed using a transhepatic approach. 40 mL of purulent fluid was removed from the gallbladder. IMPRESSION: Successful percutaneous cholecystostomy tube placement using ultrasound and fluoroscopic guidance. Gallbladder fluid was sent for culture. Electronically Signed   By: Juliene Balder M.D.   On: 07/05/2023 17:43   ECHOCARDIOGRAM COMPLETE Result Date: 07/05/2023    ECHOCARDIOGRAM REPORT   Patient Name:   Jeff Key Date of Exam: 07/05/2023 Medical Rec #:  [id number]        Height:       70.0 in Accession #:    [id number]       Weight:       240.0 lb Date of Birth:  1945/10/02        BSA:          2.255 m Patient Age:    50 years         BP:           115/79 mmHg Patient Gender: M                HR:           80 bpm. Exam Location:  [facility] Procedure: 2D Echo, 3D Echo, Cardiac Doppler and Color Doppler (Both Spectral            and Color Flow Doppler were utilized during procedure). Indications:     CHF  History:         Patient has prior history of Echocardiogram examinations, most                  recent 05/18/2016. CHF and Cardiomyopathy, CAD and Previous                  Myocardial Infarction, Arrythmias:Tachycardia,                  Signs/Symptoms:Alzheimer's; Risk Factors:Dyslipidemia.  Sonographer:     Naomie Reef Referring Phys:  [id number]  CORT ONEIDA MANA Diagnosing Phys: Evalene Lunger MD  Sonographer Comments: Technically difficult study due to poor echo windows, no subcostal window and patient is obese. IMPRESSIONS  1. Left ventricular ejection fraction, by estimation, is 55 to 60%. The left ventricle has normal function. The left ventricle has no regional wall motion abnormalities. Left ventricular diastolic parameters are consistent with Grade I diastolic dysfunction (impaired relaxation).  2. Right ventricular systolic function is normal. The right ventricular size is normal.  3. The mitral valve is normal in structure. Mild mitral valve regurgitation. No evidence of mitral stenosis.  4. The aortic valve is normal in structure. There is mild calcification of the aortic valve. Aortic valve regurgitation is not visualized. Aortic valve sclerosis is present, with no evidence of aortic valve stenosis.  5. There is mild dilatation of the aortic root, measuring 40 mm.  6. The inferior vena cava is normal in size with greater than 50% respiratory variability, suggesting right atrial pressure of 3 mmHg. FINDINGS  Left Ventricle: Left ventricular ejection fraction, by estimation, is 55 to 60%. The left ventricle has normal function. The left ventricle has no regional wall motion abnormalities. Strain was performed and the global longitudinal strain is  indeterminate. The left ventricular internal cavity size was normal in size. There is no left ventricular hypertrophy. Left ventricular diastolic parameters are consistent with Grade I diastolic dysfunction (impaired relaxation). Right Ventricle: The right ventricular size is normal. No increase in right ventricular wall thickness. Right ventricular systolic function is normal. Left Atrium: Left atrial size was normal in size. Right Atrium: Right atrial size was normal in size. Pericardium: There is no evidence of pericardial effusion. Mitral Valve: The mitral valve is normal in structure. Mild mitral valve  regurgitation. No evidence of mitral valve stenosis. MV peak gradient, 8.0 mmHg. The mean mitral valve gradient is 4.0 mmHg. Tricuspid Valve: The tricuspid valve is normal in structure. Tricuspid valve regurgitation is mild . No evidence of tricuspid stenosis. Aortic Valve: The aortic valve is normal in structure. There is mild calcification of the aortic valve. Aortic valve regurgitation is not visualized. Aortic valve sclerosis is present, with no evidence of aortic valve stenosis. Aortic valve mean gradient  measures 10.7 mmHg. Aortic valve peak gradient measures 21.1 mmHg. Aortic valve area, by VTI measures 1.83 cm. Pulmonic Valve: The pulmonic valve was normal in structure. Pulmonic valve regurgitation is not visualized. No evidence of pulmonic stenosis. Aorta: The aortic root is normal in size and structure. There is mild dilatation of the aortic root, measuring 40 mm. Venous: The inferior vena cava is normal in size with greater than 50% respiratory variability, suggesting right atrial pressure of 3 mmHg. IAS/Shunts: No atrial level shunt detected by color flow Doppler. Additional Comments: 3D was performed not requiring image post processing on an independent workstation and was indeterminate.  LEFT VENTRICLE PLAX 2D LVIDd:         4.90 cm     Diastology LVIDs:         3.60 cm     LV e' medial:    5.98 cm/s LV PW:         1.70 cm     LV E/e' medial:  17.1 LV IVS:        2.00 cm     LV e' lateral:   5.98 cm/s LVOT diam:     2.30 cm     LV E/e' lateral: 17.1 LV SV:         85 LV SV Index:   38 LVOT Area:     4.15 cm  LV Volumes (MOD) LV vol d, MOD A2C: 91.6 ml LV vol d, MOD A4C: 80.3 ml LV vol s, MOD A2C: 47.8 ml LV vol s, MOD A4C: 49.6 ml LV SV MOD A2C:     43.8 ml LV SV MOD A4C:     80.3 ml LV SV MOD BP:      37.7 ml RIGHT VENTRICLE RV Basal diam:  3.65 cm RV Mid diam:    3.50 cm RV S prime:     14.00 cm/s RVOT diam:      4.20 cm TAPSE (M-mode): 2.8 cm LEFT ATRIUM             Index        RIGHT ATRIUM            Index LA Vol (A2C):   29.9 ml 13.26 ml/m  RA Area:     13.30 cm LA Vol (A4C):   38.9 ml 17.25 ml/m  RA Volume:   25.40 ml  11.26 ml/m LA Biplane Vol: 37.0 ml 16.40 ml/m  AORTIC VALVE  PULMONIC VALVE AV Area (Vmax):    1.83 cm      PV Vmax:       0.90 m/s AV Area (Vmean):   1.66 cm      PV Peak grad:  3.2 mmHg AV Area (VTI):     1.83 cm AV Vmax:           229.67 cm/s AV Vmean:          149.000 cm/s AV VTI:            0.463 m AV Peak Grad:      21.1 mmHg AV Mean Grad:      10.7 mmHg LVOT Vmax:         101.00 cm/s LVOT Vmean:        59.700 cm/s LVOT VTI:          0.204 m LVOT/AV VTI ratio: 0.44  AORTA Ao Root diam: 4.00 cm Ao Asc diam:  3.50 cm MITRAL VALVE MV Area (PHT): 2.34 cm     SHUNTS MV Area VTI:   1.87 cm     Systemic VTI:  0.20 m MV Peak grad:  8.0 mmHg     Systemic Diam: 2.30 cm MV Mean grad:  4.0 mmHg     Pulmonic Diam: 4.20 cm MV Vmax:       1.41 m/s MV Vmean:      88.9 cm/s MV Decel Time: 324 msec MV E velocity: 102.00 cm/s MV A velocity: 127.00 cm/s MV E/A ratio:  0.80 Evalene Lunger MD Electronically signed by Evalene Lunger MD Signature Date/Time: 07/05/2023/2:38:26 PM    Final    CT CHEST ABDOMEN PELVIS W CONTRAST Result Date: 07/05/2023 CLINICAL DATA:  Vomiting, abdominal pain, and sepsis. EXAM: CT CHEST, ABDOMEN, AND PELVIS WITH CONTRAST TECHNIQUE: Multidetector CT imaging of the chest, abdomen and pelvis was performed following the standard protocol during bolus administration of intravenous contrast. RADIATION DOSE REDUCTION: This exam was performed according to the departmental dose-optimization program which includes automated exposure control, adjustment of the mA and/or kV according to patient size and/or use of iterative reconstruction technique. CONTRAST:  80mL OMNIPAQUE  IOHEXOL  300 MG/ML  SOLN COMPARISON:  Portable chest today, abdomen and pelvis CT with contrast 06/29/2023, and chest, abdomen and pelvis CT with IV contrast 11/16/2021. FINDINGS: CT CHEST  FINDINGS Cardiovascular: There is mild cardiomegaly. Three-vessel coronary artery calcifications with prior LAD stenting. There are patchy calcific plaques in the thoracic aorta, scattered calcification in the great vessels. No aneurysm, dissection or stenosis. There are calcifications and thickening of the aortic valve leaflets. Consider echocardiographic follow-up to assess valvular function. There is no pericardial effusion. The pulmonary arteries and veins are normal in caliber. The pulmonary arteries are centrally clear. Mediastinum/Nodes: Chronic mediastinal lipomatosis. No adenopathy. Hiatal fat hernia. Lungs/Pleura: Chronic left-sided volume loss and posterior basal atelectasis. There is respiratory motion. Mild posterior atelectasis on the right. No consolidation or nodule is seen through the breathing motion. No effusion. Trachea and central airways are clear Musculoskeletal: 1 cm bone island again noted T1 vertebral body. No acute or other significant osseous findings. Degenerative disc disease and spondylosis mid to lower thoracic spine. The ribcage is intact. Small amount of gynecomastia noted bilaterally. CT ABDOMEN PELVIS FINDINGS Hepatobiliary: The liver is 24 cm in length and moderately steatotic. There is no mass enhancement. New compared with 6 days ago, there is increased gallbladder wall thickening, pericholecystic fluid and pericholecystic edema. Findings are most likely due to acute cholecystitis. There is a  single 1 cm partially calcified stone in the proximal gallbladder. There are no other visible stones. There is no intrahepatic or extrahepatic bile duct dilatation. Pancreas: Partially atrophic.  Otherwise unremarkable. Spleen: Unremarkable. Adrenals/Urinary Tract: There is no adrenal or renal mass enhancement. There are 2 punctate nonobstructive caliceal stones in the superior pole of the right kidney. No left nephrolithiasis is seen and no ureteral stones or hydronephrosis bilaterally.  The bladder is contracted and not well seen. Stomach/Bowel: No dilatation or wall thickening including the appendix. Scattered colonic diverticula without diverticulitis. Vascular/Lymphatic: Aortic atherosclerosis. No enlarged abdominal or pelvic lymph nodes. Reproductive: No prostatomegaly. Other: No pelvic ascites. Trace reactive fluid right paracolic gutter. No free air, free hemorrhage or abscess. Small umbilical and bilateral inguinal fat hernias. Musculoskeletal: Degenerative change and mild dextroscoliosis lumbar spine. Mild hip DJD. No acute or significant regional osseous findings. IMPRESSION: 1. No acute chest CT findings. 2. Cardiomegaly with aortic and coronary artery atherosclerosis. 3. Calcifications and thickening of the aortic valve leaflets. Consider echocardiographic follow-up to assess valvular function. 4. Hiatal fat hernia. 5. Chronic left-sided volume loss and posterior basal atelectasis. 6. Cholelithiasis with increased gallbladder wall thickening, pericholecystic fluid and pericholecystic edema, most likely due to acute cholecystitis. 7. Hepatomegaly and steatosis. 8. Nonobstructive right micronephrolithiasis. 9. Diverticulosis without evidence of diverticulitis. 10. Umbilical and inguinal fat hernias. 11. Critical Value/emergent results were called by telephone at the time of interpretation on 07/05/2023 at 4:22 am to provider [facility] , who verbally acknowledged these results. Electronically Signed   By: Francis Quam M.D.   On: 07/05/2023 04:36   DG Chest Port 1 View Result Date: 07/05/2023 CLINICAL DATA:  Possible sepsis EXAM: PORTABLE CHEST 1 VIEW COMPARISON:  06/28/2023 FINDINGS: Stable cardiomegaly. Lungs are hypoinflated but clear. No bony abnormality is noted. IMPRESSION: Unchanged from the prior exam. Electronically Signed   By: Oneil Devonshire M.D.   On: 07/05/2023 02:08    Scheduled Meds:  aspirin  EC  81 mg Oral Daily   atorvastatin   20 mg Oral Daily   enoxaparin  (LOVENOX )  injection  50 mg Subcutaneous Q24H   escitalopram   5 mg Oral Daily   galantamine   8 mg Oral BID WC   insulin  aspart  0-20 Units Subcutaneous TID WC   memantine   10 mg Oral BID   ondansetron  (ZOFRAN ) IV  4 mg Intravenous Once   sodium chloride  flush  5 mL Intracatheter Q8H   Continuous Infusions:  piperacillin -tazobactam (ZOSYN )  IV 3.375 g (07/06/23 0200)     LOS: 1 day  MDM: Patient is high risk for one or more organ failure.  They necessitate ongoing hospitalization for continued IV therapies and subsequent lab monitoring. Total time spent interpreting labs and vitals, reviewing the medical record, coordinating care amongst consultants and care team members, directly assessing and discussing care with the patient and/or family: 55 min  Deshanna Kama, DO [facility]  To contact the attending physician between 7A-7P please use Epic Chat. To contact the covering physician during after hours 7P-7A, please review Amion.  07/06/2023, 8:00 AM   *This document has been created with the assistance of dictation software. Please excuse typographical errors. *

## 2023-07-06 NOTE — Progress Notes (Signed)
 Transition of Care [facility]) - Inpatient Brief Assessment   Patient Details  Name: Jeff Key MRN: [id number] Date of Birth: 02/28/1946  Transition of Care [facility]) CM/SW Contact:    Koren Plyler C Aziz Slape, RN Phone Number: 07/06/2023, 3:19 PM   Clinical Narrative: TOC continuing to follow patient's progress throughout discharge planning.   Transition of Care Asessment: Insurance and Status: Insurance coverage has been reviewed Patient has primary care physician: Yes   Prior level of function:: independent Prior/Current Home Services: No current home services Social Drivers of Health Review: SDOH reviewed no interventions necessary Readmission risk has been reviewed: Yes Transition of care needs: no transition of care needs at this time

## 2023-07-06 NOTE — Evaluation (Signed)
 Clinical/Bedside Swallow Evaluation Patient Details  Name: Jeff Key MRN: [id number] Date of Birth: [birth year]  Today's Date: 07/06/2023 Time: SLP Start Time (ACUTE ONLY): 1235 SLP Stop Time (ACUTE ONLY): 1335 SLP Time Calculation (min) (ACUTE ONLY): 60 min  Past Medical History:  Past Medical History:  Diagnosis Date   Anemia ~ 11/2015   CAD (coronary artery disease)    a. inf STEMI on 02/10/16. RCA treated with DES followed by staged PCI of LAD and Circumflex 02/14/16.   Dementia (HCC) dx'd 01/2016   GERD (gastroesophageal reflux disease)    History of stomach ulcers 1980s?   Ischemic cardiomyopathy    a. 01/2016: EF 40-45%.    Past Surgical History:  Past Surgical History:  Procedure Laterality Date   CARDIAC CATHETERIZATION N/A 02/10/2016   Procedure: Left Heart Cath and Coronary Angiography;  Surgeon: Avanell Leigh, MD;  Location: [facility] INVASIVE CV LAB;  Service: Cardiovascular;  Laterality: N/A;   CARDIAC CATHETERIZATION N/A 02/10/2016   Procedure: Coronary Stent Intervention;  Surgeon: Avanell Leigh, MD;  Location: [facility] INVASIVE CV LAB;  Service: Cardiovascular;  Laterality: N/A;   CARDIAC CATHETERIZATION N/A 02/14/2016   Procedure: Coronary Stent Intervention-LAD and CFX;  Surgeon: Avanell Leigh, MD;  Location: [facility] INVASIVE CV LAB;  Service: Cardiovascular;  Laterality: N/A;   COLONOSCOPY WITH PROPOFOL  N/A 05/06/2015   Procedure: COLONOSCOPY WITH PROPOFOL ;  Surgeon: Stephens Eis, MD;  Location: [facility] ENDOSCOPY;  Service: Gastroenterology;  Laterality: N/A;   CORONARY ANGIOPLASTY WITH STENT PLACEMENT  02/14/2016   "2 stents"   ESOPHAGOGASTRODUODENOSCOPY (EGD) WITH PROPOFOL  N/A 05/06/2015   Procedure: ESOPHAGOGASTRODUODENOSCOPY (EGD) WITH PROPOFOL ;  Surgeon: Stephens Eis, MD;  Location: [facility] ENDOSCOPY;  Service: Gastroenterology;  Laterality: N/A;   FOREARM FRACTURE SURGERY Right 1985   "got it tore up in [place]"   FRACTURE SURGERY     IR PERC CHOLECYSTOSTOMY  07/05/2023    HPI:  Pt is a 78 y.o. male with medical history significant of Advanced Dementia nonverbal at baseline, Obesity, CAD, ischemic cardiomyopathy, chronic HFrEF with LVEF 45-50%, chronic ulcerative proctosigmoiditis, IIDM, obesity brought in by family member for evaluation of worsening GI symptoms including intermittent nausea with vomiting and subsequent fever and chills and abdominal pain.     Patient is nonverbal at baseline and unable to provide any history, history provided by wife at bedside.  Symptoms started 7 days ago, patient started develop intermittent nausea vomiting and epigastric discomfort, " morning after eating" and occasional vomiting of stomach content nonbloody nonbilious no diarrhea.   Surgery consulted and performed a percutaneous cholecystostomy tube placement for Acute cholecystitis.    CXR: Stable low lung volumes with subpleural left lung atelectasis or  scarring as per CT this morning.  No new cardiopulmonary abnormality.     Assessment / Plan / Recommendation  Clinical Impression   Pt seen for BSE today. Pt awake, mostly nonverbal but smiled at this SLP 4-5x. Pt required MOD-MAX cues including tactile cues during session for follow through.  On [place] O2 support 2L; afebrile. WBC trending down now. Surgery addressing current Acute cholecystitis.  Pt appears to present w/ grossly functional oropharyngeal phase swallowing of thin liquids and purees; no solids were assessed d/t pt Not wearing his Denture plate/partial. No overt pharyngeal phase dysphagia noted, however, min+ oral prep>oral phase impact from decreased awareness secondary to Advanced Dementia. His presentation w/ oral intake resembles his oral intake at home per Daughter present in room assisting.  Pt consumed po trials given  w/ No overt, clinical s/s of aspiration during the trials. Pt appears at reduced risk for aspiration/aspiration pneumonia when following general aspiration precautions and given 100% feeding  Supervision and Support. However, pt does have challenging factors that could impact oropharyngeal swallowing to include discomfort(NSG aware) from recent illness, deconditioning/weakness, Advanced Dementia/Cognitive decline as well as need for FULL feeding assistance at meals(required at home also). These factors can increase risk for dysphagia as well as decreased oral intake overall.   During po trials given(no solids d/t pt not wearing his Denture/partial), pt consumed consistencies w/ no overt coughing, decline in vocal quality, or change in respiratory presentation during/post trials. O2 sats remained in upper 90s during intake. Oral Prep was c/b decreased awareness for bolus acceptance and MOD tactile/verbal/visual cues given to support. Oral phase appeared grossly [facility] w/ trials given w/ more timely bolus management and control of bolus propulsion for A-P transfer for swallowing as trials continued. Oral clearing achieved b/t trial consistencies.  OM Exam was cursory d/t Cognitive decline and follow through -- No unilateral weakness noted. Mild tremors. Pt required FULL feeding assistance and support.   Recommend a Puree diet(dysphagia level 1) w/ thin liquids at this time until pt can practice wearing/have Denture/partial placed and solid food trials assessed.  Well-moistened foods. Carefully monitor straw use, pinch as needed monitor bolus size for Small Sips Slowly. Pt should help to Hold Cup when drinking if able to. Recommend general aspiration precautions including giving Cues and Time to swallow/clear mouth. FULL feeding support/Supervision at meals. Pills CRUSHED in Puree for safer, easier swallowing -- encouraged now and for D/C to the Dtr.   Education given on Pills in Puree; food consistency and practice wearing Denture/partial; general aspiration precautions to pt and Dtr. ST services will monitor pt's status and toleration of diet, and provide education while admitted. NSG updated, agreed.  MD updated. Recommend Dietician f/u for support. SLP Visit Diagnosis: Dysphagia, oral phase (R13.11) (advanced Dementia at baseline; missing Dentition currently)    Aspiration Risk  Mild aspiration risk;Risk for inadequate nutrition/hydration (reduced following precautions)    Diet Recommendation   Thin;Dysphagia 1 (puree) (rec'd -- MD wants clear liquid diet currently d/t GI status) = Puree diet(dysphagia level 1) w/ thin liquids at this time until pt can practice wearing/have Denture/partial placed and solid food trials assessed.  Well-moistened foods. Carefully monitor straw use, pinch as needed monitor bolus size for Small Sips Slowly. Pt should help to Hold Cup when drinking if able to. Recommend general aspiration precautions including giving Cues and Time to swallow/clear mouth. FULL feeding support/Supervision at meals.   Medication Administration: Crushed with puree    Other  Recommendations Recommended Consults:  (Dietiician f/u) Oral Care Recommendations: Oral care BID;Oral care before and after PO;Staff/trained caregiver to provide oral care (Denture care)    Recommendations for follow up therapy are one component of a multi-disciplinary discharge planning process, led by the attending physician.  Recommendations may be updated based on patient status, additional functional criteria and insurance authorization.  Follow up Recommendations  (tbd)      Assistance Recommended at Discharge  FULL d/t Dementia  Functional Status Assessment Patient has had a recent decline in their functional status and demonstrates the ability to make significant improvements in function in a reasonable and predictable amount of time.  Frequency and Duration min 2x/week  2 weeks       Prognosis Prognosis for improved oropharyngeal function: Fair Barriers to Reach Goals: Cognitive deficits;Language deficits;Time post onset;Severity  of deficits Barriers/Prognosis Comment: advanced Dementia at baseline;  missing Dentition currently      Swallow Study   General Date of Onset: 07/05/23 HPI: Pt is a 78 y.o. male with medical history significant of Advanced Dementia nonverbal at baseline, Obesity, CAD, ischemic cardiomyopathy, chronic HFrEF with LVEF 45-50%, chronic ulcerative proctosigmoiditis, IIDM, obesity brought in by family member for evaluation of worsening GI symptoms including intermittent nausea with vomiting and subsequent fever and chills and abdominal pain.     Patient is nonverbal at baseline and unable to provide any history, history provided by wife at bedside.  Symptoms started 7 days ago, patient started develop intermittent nausea vomiting and epigastric discomfort, " morning after eating" and occasional vomiting of stomach content nonbloody nonbilious no diarrhea.  Surgery consulted and performed a percutaneous cholecystostomy tube placement for Acute cholecystitis.   CXR: Stable low lung volumes with subpleural left lung atelectasis or  scarring as per CT this morning.  No new cardiopulmonary abnormality. Type of Study: Bedside Swallow Evaluation Previous Swallow Assessment: 2021- mech soft diet, thins Diet Prior to this Study: NPO Temperature Spikes Noted: No (17.5 trending down) Respiratory Status: Room air History of Recent Intubation: No Behavior/Cognition: Alert;Cooperative;Pleasant mood;Confused;Distractible;Requires cueing;Doesn't follow directions Oral Cavity Assessment: Dry;Dried secretions (sticky) Oral Care Completed by SLP: Yes Oral Cavity - Dentition: Missing dentition (wears partial; denture plate) Vision:  (n/a) Self-Feeding Abilities: Total assist Patient Positioning: Upright in bed (MAX assist) Baseline Vocal Quality:  (n/a) Volitional Cough: Cognitively unable to elicit Volitional Swallow: Unable to elicit    Oral/Motor/Sensory Function Overall Oral Motor/Sensory Function:  (no unilateral weakness noted; tremors)   Ice Chips Ice chips: Not tested   Thin  Liquid Thin Liquid: Impaired (oral prep) Presentation: Straw (drinks via straw at home per Family; ~4 ozs total) Oral Phase Impairments: Poor awareness of bolus Oral Phase Functional Implications: Prolonged oral transit (intermittent) Pharyngeal  Phase Impairments:  (no overt coughing) Other Comments: water, juice    Nectar Thick Nectar Thick Liquid: Not tested   Honey Thick Honey Thick Liquid: Not tested   Puree Puree: Impaired (oral prep) Presentation: Spoon (fed; ~4 ozs) Oral Phase Impairments: Poor awareness of bolus Oral Phase Functional Implications: Prolonged oral transit (intermittent) Pharyngeal Phase Impairments:  (no overt coughing)   Solid     Solid: Not tested Other Comments: not wearing partial/Denture        Darla Edward, MS, CCC-SLP Speech Language Pathologist Rehab Services; [facility] [place] [phone] (ascom) Jaynia Fendley 07/06/2023,4:29 PM

## 2023-07-06 NOTE — Progress Notes (Signed)
 Pt is running a fever tonight of 101.3, pt came in for acute cholecystitis, pt do not have PRN tylenol . He is on IV antibiotic already, other VSS. Notify Dr. Vallarie Gauze for tylenol  prn order place. No other concern at the moment. Plan of care continued,.

## 2023-07-06 NOTE — Progress Notes (Signed)
 Referring Physician(s): Tye Millet, MD  Supervising Physician: Philip Cornet  Patient Status:  [facility] - In-pt  Chief Complaint:  Cholecystitis s/p percutaneous cholecystostomy tube placement 07/05/23 in IR (Dr. Philip)  Subjective:  Patient laying in bed with eyes closed, appears comfortable. Wife at bedside with questions regarding continued NPO status, concerned he has not had nutrition by mouth or IV in over a day. She says that at home he eats a mostly regular diet which she feeds him, he does not open his eyes much with eating so she tells me that you just have to know when he's ready for a bite and make sure he chews really well before swallowing. Reviewed current plan for cholecystostomy tube which she understands.   Allergies: Patient has no known allergies.  Medications: Prior to Admission medications   Medication Sig Start Date End Date Taking? Authorizing Provider  aspirin  EC 81 MG tablet Take 81 mg by mouth daily.   Yes [provider]  atorvastatin  (LIPITOR ) 20 MG tablet Take 1 tablet (20 mg total) by mouth daily. 11/21/22  Yes Court Dorn PARAS, MD  balsalazide (COLAZAL ) 750 MG capsule Take 2,250 mg by mouth 3 (three) times daily. 09/23/19  Yes [provider]  clopidogrel  (PLAVIX ) 75 MG tablet Take 1 tablet (75 mg total) by mouth daily. 11/21/22  Yes Court Dorn PARAS, MD  escitalopram  (LEXAPRO ) 5 MG tablet Take 5 mg by mouth daily. 08/29/19  Yes [provider]  ferrous sulfate  325 (65 FE) MG tablet Take 325 mg by mouth daily with breakfast.   Yes [provider]  galantamine  (RAZADYNE ) 12 MG tablet Take 12 mg by mouth 2 (two) times daily with a meal. 09/02/19  Yes [provider]  lisinopril  (ZESTRIL ) 10 MG tablet Take 1 tablet (10 mg total) by mouth daily. 11/21/22  Yes Court Dorn PARAS, MD  memantine  (NAMENDA ) 10 MG tablet Take 10 mg by mouth 2 (two) times daily. 08/07/19  Yes [provider]  metFORMIN (GLUCOPHAGE) 500 MG  tablet Take 500 mg by mouth 2 (two) times daily with a meal.   Yes [provider]  nitroGLYCERIN  (NITROSTAT ) 0.4 MG SL tablet Place 1 tablet (0.4 mg total) under the tongue every 5 (five) minutes as needed for chest pain. 02/15/16  Yes Sebastian Lamarr SAUNDERS, PA-C  ondansetron  (ZOFRAN ) 4 MG tablet Take 1 tablet (4 mg total) by mouth every 6 (six) hours as needed for up to 7 days for nausea or vomiting. 06/29/23 07/06/23 Yes Ray, Nilsa, MD  vitamin B-12 (CYANOCOBALAMIN ) 1000 MCG tablet Take 1,000 mcg by mouth daily.   Yes [provider]  metoprolol  tartrate (LOPRESSOR ) 25 MG tablet Hold until followup with outpatient provider since heart rate was low without it. Patient not taking: Reported on 07/05/2023 11/20/21   Awanda City, MD  omeprazole (PRILOSEC) 20 MG capsule Take 20 mg by mouth daily. Patient not taking: Reported on 07/05/2023    [provider]     Vital Signs: BP 133/64 (BP Location: Right Arm)   Pulse 84   Temp 99.7 F (37.6 C)   Resp 16   Ht 5' 10 (1.778 m)   Wt 240 lb 1.3 oz (108.9 kg)   SpO2 90%   BMI 34.45 kg/m   Physical Exam Vitals and nursing note reviewed.  Constitutional:      Comments: Somnolent; opens eyes occasionally but does not respond to verbal/tactile cues.   HENT:     Head: Normocephalic.  Cardiovascular:  Rate and Rhythm: Normal rate.  Pulmonary:     Effort: Pulmonary effort is normal.  Abdominal:     Palpations: Abdomen is soft.     Comments: (+) RUQ cholecystostomy to gravity with ~10 cc clear yellow fluid and small amount of blood. Flushed easily. Insertion site unremarkable.  Skin:    General: Skin is warm and dry.  Neurological:     Mental Status: Mental status is at baseline.     Imaging: DG Chest 1 View Result Date: 07/06/2023 CLINICAL DATA:  78 year old male with congestive heart failure. EXAM: CHEST  1 VIEW COMPARISON:  CT Chest, Abdomen, and Pelvis 0354 hours today. FINDINGS: Portable AP semi upright view at  0612 hours. Stable low lung volumes, cardiomegaly and mediastinal contours. No pneumothorax or pulmonary edema. Left lung subpleural opacity better demonstrated by CT and resembling atelectasis or scarring. Stable ventilation from the CT this morning. Visualized tracheal air column is within normal limits. Stable visualized osseous structures. Stable visible bowel gas pattern. IMPRESSION: Stable low lung volumes with subpleural left lung atelectasis or scarring as per CT this morning. No new cardiopulmonary abnormality. Electronically Signed   By: VEAR Hurst M.D.   On: 07/06/2023 06:44   IR Perc Cholecystostomy Result Date: 07/05/2023 INDICATION: 78 year old with sepsis and acute cholecystitis. Patient is not a surgical candidate. EXAM: PLACEMENT OF PERCUTANEOUS CHOLECYSTOSTOMY TUBE USING ULTRASOUND AND FLUOROSCOPIC GUIDANCE MEDICATIONS: Fentanyl  100 mcg ANESTHESIA/SEDATION: The patient's level of consciousness and vital signs were monitored continuously by radiology nursing throughout the procedure under my direct supervision. FLUOROSCOPY TIME:  Radiation Exposure Index (as provided by the fluoroscopic device): 28 mGy Kerma COMPLICATIONS: None immediate. PROCEDURE: Patient has severe dementia and cannot communicate. Informed written consent was obtained from the patient's wife after a thorough discussion of the procedural risks, benefits and alternatives. All questions were addressed. Maximal Sterile Barrier Technique was utilized including caps, mask, sterile gowns, sterile gloves, sterile drape, hand hygiene and skin antiseptic. A timeout was performed prior to the initiation of the procedure. Patient was placed supine on the interventional table. The gallbladder was identified with ultrasound. Ultrasound image was saved for documentation. Right upper abdomen was prepped with chlorhexidine  and sterile field was created. Skin was anesthetized using 1% lidocaine . A small incision was made. Using ultrasound  guidance, a 21 gauge needle was directed into the gallbladder via a transhepatic approach. 0.018 wire was advanced into the gallbladder using fluoroscopic guidance. A transitional dilator set was placed and purulent fluid was aspirated. J wire was placed and the tract was dilated to accommodate a 10 French multipurpose drain. Approximately 40 mL of purulent fluid was aspirated from the gallbladder. Small amount of contrast was injected to confirm placement as well. Drain was sutured to skin and attached to a gravity bag. Dressing was placed. Fluoroscopic and ultrasound images were taken and saved for documentation. FINDINGS: Mild distention of the gallbladder. Drain was placed using a transhepatic approach. 40 mL of purulent fluid was removed from the gallbladder. IMPRESSION: Successful percutaneous cholecystostomy tube placement using ultrasound and fluoroscopic guidance. Gallbladder fluid was sent for culture. Electronically Signed   By: Juliene Balder M.D.   On: 07/05/2023 17:43   ECHOCARDIOGRAM COMPLETE Result Date: 07/05/2023    ECHOCARDIOGRAM REPORT   Patient Name:   Jeff Key Date of Exam: 07/05/2023 Medical Rec #:  [id number]        Height:       70.0 in Accession #:    [id number]  Weight:       240.0 lb Date of Birth:  24-Oct-1945        BSA:          2.255 m Patient Age:    78 years         BP:           115/79 mmHg Patient Gender: M                HR:           80 bpm. Exam Location:  [facility] Procedure: 2D Echo, 3D Echo, Cardiac Doppler and Color Doppler (Both Spectral            and Color Flow Doppler were utilized during procedure). Indications:     CHF  History:         Patient has prior history of Echocardiogram examinations, most                  recent 05/18/2016. CHF and Cardiomyopathy, CAD and Previous                  Myocardial Infarction, Arrythmias:Tachycardia,                  Signs/Symptoms:Alzheimer's; Risk Factors:Dyslipidemia.  Sonographer:     Naomie Reef Referring Phys:  [id number]  CORT ONEIDA MANA Diagnosing Phys: Evalene Lunger MD  Sonographer Comments: Technically difficult study due to poor echo windows, no subcostal window and patient is obese. IMPRESSIONS  1. Left ventricular ejection fraction, by estimation, is 55 to 60%. The left ventricle has normal function. The left ventricle has no regional wall motion abnormalities. Left ventricular diastolic parameters are consistent with Grade I diastolic dysfunction (impaired relaxation).  2. Right ventricular systolic function is normal. The right ventricular size is normal.  3. The mitral valve is normal in structure. Mild mitral valve regurgitation. No evidence of mitral stenosis.  4. The aortic valve is normal in structure. There is mild calcification of the aortic valve. Aortic valve regurgitation is not visualized. Aortic valve sclerosis is present, with no evidence of aortic valve stenosis.  5. There is mild dilatation of the aortic root, measuring 40 mm.  6. The inferior vena cava is normal in size with greater than 50% respiratory variability, suggesting right atrial pressure of 3 mmHg. FINDINGS  Left Ventricle: Left ventricular ejection fraction, by estimation, is 55 to 60%. The left ventricle has normal function. The left ventricle has no regional wall motion abnormalities. Strain was performed and the global longitudinal strain is indeterminate. The left ventricular internal cavity size was normal in size. There is no left ventricular hypertrophy. Left ventricular diastolic parameters are consistent with Grade I diastolic dysfunction (impaired relaxation). Right Ventricle: The right ventricular size is normal. No increase in right ventricular wall thickness. Right ventricular systolic function is normal. Left Atrium: Left atrial size was normal in size. Right Atrium: Right atrial size was normal in size. Pericardium: There is no evidence of pericardial effusion. Mitral Valve: The mitral valve is normal in structure. Mild mitral valve  regurgitation. No evidence of mitral valve stenosis. MV peak gradient, 8.0 mmHg. The mean mitral valve gradient is 4.0 mmHg. Tricuspid Valve: The tricuspid valve is normal in structure. Tricuspid valve regurgitation is mild . No evidence of tricuspid stenosis. Aortic Valve: The aortic valve is normal in structure. There is mild calcification of the aortic valve. Aortic valve regurgitation is not visualized. Aortic valve sclerosis is present, with no evidence of aortic  valve stenosis. Aortic valve mean gradient  measures 10.7 mmHg. Aortic valve peak gradient measures 21.1 mmHg. Aortic valve area, by VTI measures 1.83 cm. Pulmonic Valve: The pulmonic valve was normal in structure. Pulmonic valve regurgitation is not visualized. No evidence of pulmonic stenosis. Aorta: The aortic root is normal in size and structure. There is mild dilatation of the aortic root, measuring 40 mm. Venous: The inferior vena cava is normal in size with greater than 50% respiratory variability, suggesting right atrial pressure of 3 mmHg. IAS/Shunts: No atrial level shunt detected by color flow Doppler. Additional Comments: 3D was performed not requiring image post processing on an independent workstation and was indeterminate.  LEFT VENTRICLE PLAX 2D LVIDd:         4.90 cm     Diastology LVIDs:         3.60 cm     LV e' medial:    5.98 cm/s LV PW:         1.70 cm     LV E/e' medial:  17.1 LV IVS:        2.00 cm     LV e' lateral:   5.98 cm/s LVOT diam:     2.30 cm     LV E/e' lateral: 17.1 LV SV:         85 LV SV Index:   38 LVOT Area:     4.15 cm  LV Volumes (MOD) LV vol d, MOD A2C: 91.6 ml LV vol d, MOD A4C: 80.3 ml LV vol s, MOD A2C: 47.8 ml LV vol s, MOD A4C: 49.6 ml LV SV MOD A2C:     43.8 ml LV SV MOD A4C:     80.3 ml LV SV MOD BP:      37.7 ml RIGHT VENTRICLE RV Basal diam:  3.65 cm RV Mid diam:    3.50 cm RV S prime:     14.00 cm/s RVOT diam:      4.20 cm TAPSE (M-mode): 2.8 cm LEFT ATRIUM             Index        RIGHT ATRIUM            Index LA Vol (A2C):   29.9 ml 13.26 ml/m  RA Area:     13.30 cm LA Vol (A4C):   38.9 ml 17.25 ml/m  RA Volume:   25.40 ml  11.26 ml/m LA Biplane Vol: 37.0 ml 16.40 ml/m  AORTIC VALVE                     PULMONIC VALVE AV Area (Vmax):    1.83 cm      PV Vmax:       0.90 m/s AV Area (Vmean):   1.66 cm      PV Peak grad:  3.2 mmHg AV Area (VTI):     1.83 cm AV Vmax:           229.67 cm/s AV Vmean:          149.000 cm/s AV VTI:            0.463 m AV Peak Grad:      21.1 mmHg AV Mean Grad:      10.7 mmHg LVOT Vmax:         101.00 cm/s LVOT Vmean:        59.700 cm/s LVOT VTI:          0.204 m LVOT/AV VTI ratio:  0.44  AORTA Ao Root diam: 4.00 cm Ao Asc diam:  3.50 cm MITRAL VALVE MV Area (PHT): 2.34 cm     SHUNTS MV Area VTI:   1.87 cm     Systemic VTI:  0.20 m MV Peak grad:  8.0 mmHg     Systemic Diam: 2.30 cm MV Mean grad:  4.0 mmHg     Pulmonic Diam: 4.20 cm MV Vmax:       1.41 m/s MV Vmean:      88.9 cm/s MV Decel Time: 324 msec MV E velocity: 102.00 cm/s MV A velocity: 127.00 cm/s MV E/A ratio:  0.80 Evalene Lunger MD Electronically signed by Evalene Lunger MD Signature Date/Time: 07/05/2023/2:38:26 PM    Final    CT CHEST ABDOMEN PELVIS W CONTRAST Result Date: 07/05/2023 CLINICAL DATA:  Vomiting, abdominal pain, and sepsis. EXAM: CT CHEST, ABDOMEN, AND PELVIS WITH CONTRAST TECHNIQUE: Multidetector CT imaging of the chest, abdomen and pelvis was performed following the standard protocol during bolus administration of intravenous contrast. RADIATION DOSE REDUCTION: This exam was performed according to the departmental dose-optimization program which includes automated exposure control, adjustment of the mA and/or kV according to patient size and/or use of iterative reconstruction technique. CONTRAST:  80mL OMNIPAQUE  IOHEXOL  300 MG/ML  SOLN COMPARISON:  Portable chest today, abdomen and pelvis CT with contrast 06/29/2023, and chest, abdomen and pelvis CT with IV contrast 11/16/2021. FINDINGS: CT CHEST  FINDINGS Cardiovascular: There is mild cardiomegaly. Three-vessel coronary artery calcifications with prior LAD stenting. There are patchy calcific plaques in the thoracic aorta, scattered calcification in the great vessels. No aneurysm, dissection or stenosis. There are calcifications and thickening of the aortic valve leaflets. Consider echocardiographic follow-up to assess valvular function. There is no pericardial effusion. The pulmonary arteries and veins are normal in caliber. The pulmonary arteries are centrally clear. Mediastinum/Nodes: Chronic mediastinal lipomatosis. No adenopathy. Hiatal fat hernia. Lungs/Pleura: Chronic left-sided volume loss and posterior basal atelectasis. There is respiratory motion. Mild posterior atelectasis on the right. No consolidation or nodule is seen through the breathing motion. No effusion. Trachea and central airways are clear Musculoskeletal: 1 cm bone island again noted T1 vertebral body. No acute or other significant osseous findings. Degenerative disc disease and spondylosis mid to lower thoracic spine. The ribcage is intact. Small amount of gynecomastia noted bilaterally. CT ABDOMEN PELVIS FINDINGS Hepatobiliary: The liver is 24 cm in length and moderately steatotic. There is no mass enhancement. New compared with 6 days ago, there is increased gallbladder wall thickening, pericholecystic fluid and pericholecystic edema. Findings are most likely due to acute cholecystitis. There is a single 1 cm partially calcified stone in the proximal gallbladder. There are no other visible stones. There is no intrahepatic or extrahepatic bile duct dilatation. Pancreas: Partially atrophic.  Otherwise unremarkable. Spleen: Unremarkable. Adrenals/Urinary Tract: There is no adrenal or renal mass enhancement. There are 2 punctate nonobstructive caliceal stones in the superior pole of the right kidney. No left nephrolithiasis is seen and no ureteral stones or hydronephrosis bilaterally.  The bladder is contracted and not well seen. Stomach/Bowel: No dilatation or wall thickening including the appendix. Scattered colonic diverticula without diverticulitis. Vascular/Lymphatic: Aortic atherosclerosis. No enlarged abdominal or pelvic lymph nodes. Reproductive: No prostatomegaly. Other: No pelvic ascites. Trace reactive fluid right paracolic gutter. No free air, free hemorrhage or abscess. Small umbilical and bilateral inguinal fat hernias. Musculoskeletal: Degenerative change and mild dextroscoliosis lumbar spine. Mild hip DJD. No acute or significant regional osseous findings. IMPRESSION: 1. No acute chest  CT findings. 2. Cardiomegaly with aortic and coronary artery atherosclerosis. 3. Calcifications and thickening of the aortic valve leaflets. Consider echocardiographic follow-up to assess valvular function. 4. Hiatal fat hernia. 5. Chronic left-sided volume loss and posterior basal atelectasis. 6. Cholelithiasis with increased gallbladder wall thickening, pericholecystic fluid and pericholecystic edema, most likely due to acute cholecystitis. 7. Hepatomegaly and steatosis. 8. Nonobstructive right micronephrolithiasis. 9. Diverticulosis without evidence of diverticulitis. 10. Umbilical and inguinal fat hernias. 11. Critical Value/emergent results were called by telephone at the time of interpretation on 07/05/2023 at 4:22 am to provider [facility] , who verbally acknowledged these results. Electronically Signed   By: Francis Quam M.D.   On: 07/05/2023 04:36   DG Chest Port 1 View Result Date: 07/05/2023 CLINICAL DATA:  Possible sepsis EXAM: PORTABLE CHEST 1 VIEW COMPARISON:  06/28/2023 FINDINGS: Stable cardiomegaly. Lungs are hypoinflated but clear. No bony abnormality is noted. IMPRESSION: Unchanged from the prior exam. Electronically Signed   By: Oneil Devonshire M.D.   On: 07/05/2023 02:08    Labs:  CBC: Recent Labs    06/28/23 2057 07/05/23 0145 07/06/23 0505  WBC 14.4* 27.1* 17.5*   HGB 13.6 11.9* 10.3*  HCT 41.7 36.7* 30.9*  PLT 329 346 326    COAGS: Recent Labs    06/28/23 2057 07/05/23 0145  INR 1.0 1.2    BMP: Recent Labs    06/28/23 2057 07/05/23 0145 07/06/23 0505  NA 130* 132* 135  K 4.2 3.9 3.3*  CL 97* 96* 103  CO2 22 22 23   GLUCOSE 166* 215* 147*  BUN 14 20 17   CALCIUM  8.6* 8.4* 8.1*  CREATININE 0.82 1.78* 1.02  GFRNONAA >60 39* >60    LIVER FUNCTION TESTS: Recent Labs    06/28/23 2057 07/05/23 0145 07/06/23 0505  BILITOT 0.7 1.1 0.9  AST 35 23 19  ALT 26 15 14   ALKPHOS 92 72 57  PROT 7.9 7.3 6.5  ALBUMIN 3.8 2.9* 2.5*    Assessment and Plan:  78 y/o M admitted 07/05/23 with acute cholecystitis s/p percutaneous cholecystostomy placement in IR later that same day with removal of 40 cc pink purulent fluid seen today for drain follow up.  Output clear yellow with small amount of blood in bag this morning, no issues with flushing, insertion site unremarkable. Preliminary culture of aspirate (+) few gram negative rods, currently on Zosyn  per primary team.  Drain Location: RUQ Size: Fr size: 10 Fr Date of placement: 07/05/23  Currently to: Drain collection device: gravity 24 hour output:  Output by Drain (mL) 07/04/23 0701 - 07/04/23 1900 07/04/23 1901 - 07/05/23 0700 07/05/23 0701 - 07/05/23 1900 07/05/23 1901 - 07/06/23 0700 07/06/23 0701 - 07/06/23 1015  Biliary Tube Other (Comment) 10 Fr. RUQ    40     Interval imaging/drain manipulation:  None  Current examination: Flushes/aspirates easily.  Insertion site unremarkable. Suture and stat lock in place. Dressed appropriately.   Plan: Continue TID flushes with 5 cc NS. Record output Q shift. Dressing changes QD or PRN if soiled.  Call IR APP or on call IR MD if difficulty flushing or sudden change in drain output.  Repeat imaging/possible drain injection once output < 10 mL/QD (excluding flush material). Consideration for drain removal if output is < 10 mL/QD  (excluding flush material), pending discussion with the providing surgical service.  Discharge planning: Please contact IR APP or on call IR MD prior to patient d/c to ensure appropriate follow up plans are in place.  Typically patient will follow up with IR clinic 10-14 days post d/c for repeat imaging/possible drain injection. IR scheduler will contact patient with date/time of appointment. Patient will need to flush drain QD with 5 cc NS, record output QD, dressing changes every 2-3 days or earlier if soiled.   IR will continue to follow - please call with questions or concerns.  Electronically Signed: Clotilda DELENA Hesselbach, PA-C 07/06/2023, 9:34 AM   I spent a total of 15 Minutes at the the patient's bedside AND on the patient's hospital floor or unit, greater than 50% of which was counseling/coordinating care for cholecystitis.

## 2023-07-07 DIAGNOSIS — A419 Sepsis, unspecified organism: Secondary | ICD-10-CM | POA: Diagnosis not present

## 2023-07-07 DIAGNOSIS — R6520 Severe sepsis without septic shock: Secondary | ICD-10-CM | POA: Diagnosis not present

## 2023-07-07 DIAGNOSIS — K810 Acute cholecystitis: Secondary | ICD-10-CM | POA: Diagnosis not present

## 2023-07-07 LAB — MAGNESIUM: Magnesium: 2.1 mg/dL (ref 1.7–2.4)

## 2023-07-07 LAB — CBC WITH DIFFERENTIAL/PLATELET
Abs Immature Granulocytes: 0.63 10*3/uL — ABNORMAL HIGH (ref 0.00–0.07)
Basophils Absolute: 0.1 10*3/uL (ref 0.0–0.1)
Basophils Relative: 1 %
Eosinophils Absolute: 0.4 10*3/uL (ref 0.0–0.5)
Eosinophils Relative: 2 %
HCT: 30.2 % — ABNORMAL LOW (ref 39.0–52.0)
Hemoglobin: 10 g/dL — ABNORMAL LOW (ref 13.0–17.0)
Immature Granulocytes: 4 %
Lymphocytes Relative: 10 %
Lymphs Abs: 1.7 10*3/uL (ref 0.7–4.0)
MCH: 28.8 pg (ref 26.0–34.0)
MCHC: 33.1 g/dL (ref 30.0–36.0)
MCV: 87 fL (ref 80.0–100.0)
Monocytes Absolute: 1.4 10*3/uL — ABNORMAL HIGH (ref 0.1–1.0)
Monocytes Relative: 8 %
Neutro Abs: 13.3 10*3/uL — ABNORMAL HIGH (ref 1.7–7.7)
Neutrophils Relative %: 75 %
Platelets: 343 10*3/uL (ref 150–400)
RBC: 3.47 MIL/uL — ABNORMAL LOW (ref 4.22–5.81)
RDW: 13.7 % (ref 11.5–15.5)
WBC: 17.5 10*3/uL — ABNORMAL HIGH (ref 4.0–10.5)
nRBC: 0 % (ref 0.0–0.2)

## 2023-07-07 LAB — COMPREHENSIVE METABOLIC PANEL WITH GFR
ALT: 18 U/L (ref 0–44)
AST: 33 U/L (ref 15–41)
Albumin: 2.3 g/dL — ABNORMAL LOW (ref 3.5–5.0)
Alkaline Phosphatase: 61 U/L (ref 38–126)
Anion gap: 14 (ref 5–15)
BUN: 16 mg/dL (ref 8–23)
CO2: 24 mmol/L (ref 22–32)
Calcium: 8.4 mg/dL — ABNORMAL LOW (ref 8.9–10.3)
Chloride: 100 mmol/L (ref 98–111)
Creatinine, Ser: 0.87 mg/dL (ref 0.61–1.24)
GFR, Estimated: >60 mL/min (ref >60)
Glucose, Bld: 152 mg/dL — ABNORMAL HIGH (ref 70–99)
Potassium: 3.4 mmol/L — ABNORMAL LOW (ref 3.5–5.1)
Sodium: 138 mmol/L (ref 135–145)
Total Bilirubin: 0.9 mg/dL (ref 0.0–1.2)
Total Protein: 6.3 g/dL — ABNORMAL LOW (ref 6.5–8.1)

## 2023-07-07 LAB — GLUCOSE, CAPILLARY
Glucose-Capillary: 153 mg/dL — ABNORMAL HIGH (ref 70–99)
Glucose-Capillary: 140 mg/dL — ABNORMAL HIGH (ref 70–99)
Glucose-Capillary: 136 mg/dL — ABNORMAL HIGH (ref 70–99)
Glucose-Capillary: 173 mg/dL — ABNORMAL HIGH (ref 70–99)

## 2023-07-07 LAB — PHOSPHORUS: Phosphorus: 3.5 mg/dL (ref 2.5–4.6)

## 2023-07-07 MED ORDER — POTASSIUM CHLORIDE CRYS ER 20 MEQ PO TBCR
40.0000 meq | EXTENDED_RELEASE_TABLET | Freq: Once | ORAL | Status: AC
  Administered 2023-07-07: 40 meq via ORAL
  Filled 2023-07-07: qty 2

## 2023-07-07 MED ORDER — LACTULOSE 10 GM/15ML PO SOLN
10.0000 g | Freq: Two times a day (BID) | ORAL | Status: DC
  Administered 2023-07-08 – 2023-07-09 (×3): 10 g via ORAL
  Filled 2023-07-07 (×3): qty 30

## 2023-07-07 NOTE — Plan of Care (Signed)
  Problem: Clinical Measurements: Goal: Ability to maintain clinical measurements within normal limits will improve Outcome: Progressing Goal: Will remain free from infection Outcome: Progressing Goal: Respiratory complications will improve Outcome: Progressing Goal: Cardiovascular complication will be avoided Outcome: Progressing   Problem: Safety: Goal: Ability to remain free from injury will improve Outcome: Progressing   Problem: Elimination: Goal: Will not experience complications related to urinary retention Outcome: Progressing

## 2023-07-07 NOTE — Progress Notes (Signed)
 07/07/2023  Subjective: No acute events overnight.  Patient had a temperature last night but is afebrile this morning.  WBC stable at 17.5.  Does not talk much, but per his wife, he has not seemed to be having any worsening pain.  He has tolerated a clear liquid diet.  Vital signs: Temp:  [98 F (36.7 C)-101.8 F (38.8 C)] 98 F (36.7 C) (04/19 1531) Pulse Rate:  [72-81] 73 (04/19 1531) Resp:  [18-20] 19 (04/19 1531) BP: (101-146)/(52-82) 130/75 (04/19 1531) SpO2:  [91 %-96 %] 91 % (04/19 1531)   Intake/Output: 04/18 0701 - 04/19 0700 In: 757 [P.O.:600; IV Piggyback:147] Out: 860 [Urine:800; Drains:60] Last BM Date :  (PTA)  Physical Exam: Constitutional:  No acute distress Abdomen:  soft, non-distended, does not appear tender to palpation.  RUQ perc chole drain in place with serosanguinous fluid in bag.  Labs:  Recent Labs    07/06/23 0505 07/07/23 0523  WBC 17.5* 17.5*  HGB 10.3* 10.0*  HCT 30.9* 30.2*  PLT 326 343   Recent Labs    07/06/23 0505 07/07/23 0523  NA 135 138  K 3.3* 3.4*  CL 103 100  CO2 23 24  GLUCOSE 147* 152*  BUN 17 16  CREATININE 1.02 0.87  CALCIUM  8.1* 8.4*   Recent Labs    07/05/23 0145  LABPROT 15.8*  INR 1.2    Imaging: No results found.  Assessment/Plan: This is a 78 y.o. male with acute cholecystitis, s/p percutaneous cholecystostomy drain placement.  --The patient has been tolerating clear liquids and on exam today does not appear to have any significant tenderness.  As such, will advance his diet to full liquids.  He is hard to read, so may leave him on full liquids today and reassess tomorrow to see about advancing further. --Continue IV antibiotics for now.  Cultures now showing E coli sensitive to Zosyn .  May need PICC line for IV abx given that it's sensitive to cefepime , ceftaz, ceftriaxone , and imipenem.  Consider ID consult.   I spent 35 minutes dedicated to the care of this patient on the date of this encounter to  include pre-visit review of records, face-to-face time with the patient discussing diagnosis and management, and any post-visit coordination of care.  Marene Shape, MD [facility]

## 2023-07-07 NOTE — Progress Notes (Signed)
 PROGRESS NOTE    Jeff Key  GEX:[id number] DOB: 05/20/1945 DOA: 07/05/2023 PCP: Antonio Baumgarten, MD  Chief Complaint  Patient presents with   Emesis    Hospital Course:  Jeff Key is 78 y.o. male with advanced dementia, nonverbal at baseline, CAD, ischemic cardiomyopathy, chronic heart failure reduced EF, chronic ulcerative proctosigmoiditis, insulin -dependent diabetes, obesity, who was brought in by family member due to worsening GI symptoms with nausea, vomiting, fever, abdominal pain.  Workup in ED revealed stable vital signs, increased lactic acidosis of 3.5, creatinine 1.7, leukocytosis 27, CT abdomen pelvis with signs of acute cholecystitis.  Patient was started on broad-spectrum antibiotics and given a 2 L IV bolus.  General surgery was consulted and recommended cholecystotomy tube given patient has been on antiplatelet therapy  Subjective: Patient had fever overnight.  No fever today.  His wife is at bedside and feeding him, he appears to be tolerating foods well.  Objective: Vitals:   07/06/23 2052 07/06/23 2320 07/07/23 0056 07/07/23 0520  BP: (!) 146/82  128/67 125/67  Pulse: 81  72 72  Resp: 18  20 18   Temp: (!) 101.8 F (38.8 C) (!) 101.2 F (38.4 C) 99.2 F (37.3 C) 99.1 F (37.3 C)  TempSrc: Oral     SpO2: 93%  91% 96%  Weight:      Height:        Intake/Output Summary (Last 24 hours) at 07/07/2023 1006 Last data filed at 07/07/2023 0100 Gross per 24 hour  Intake 757.03 ml  Output 730 ml  Net 27.03 ml   Filed Weights   07/05/23 0124 07/05/23 1452  Weight: 108.9 kg 108.9 kg    Examination: General exam: Appears calm and comfortable, NAD  Respiratory system: No work of breathing, symmetric chest wall expansion Cardiovascular system: S1 & S2 heard, RRR.  Gastrointestinal system: Abdomen is distended, drain in place with serosanguineous fluid,+ bowel sounds Neuro: Alert, nonverbal, does not participate in exam or follow commands Skin: No  rashes, lesions Psychiatry: calm, cannot assess otherwise  Assessment & Plan:  Principal Problem:   Severe sepsis (HCC) Active Problems:   Sepsis (HCC)   Acute cholecystitis    Sepsis with acute endorgan damage Acute cholecystitis - Sepsis criteria met with: Leukocytosis, lactic acidosis, AKI endorgan damage, infection: Acute cholecystitis - Status post IV fluids in the ED - Fever overnight.  Continue to monitor on telemetry. - Continue with Zosyn  for now.  Transition to p.o. options when afebrile and stable - Patient is status post cholecystotomy tube with IR 4/17, draining appropriately.  Continue to monitor drain output - Continue to hold Plavix  for possible surgical intervention -- Continue to monitor CBC, leukocytosis is downtrending - Continue with full liquid diet for now.  Advance slowly. - continue serial abdominal exams, low threshold for reimaging  AKI - Secondary to sepsis - Creatinine resolving now, will discontinue further IV fluids.  Encourage p.o. hydration - Continue to trend CMP  Hypokalemia - Replace as needed  History of CAD Ischemic cardiomyopathy Chronic heart failure reduced EF - Clinically euvolemic at this time - Echo: LVEF 55 to 60%, grade 1 diastolic dysfunction.  Dilation of aortic root, - Status post 3 L IV fluids in the ED. - GDMT as BP tolerates - Troponin slightly elevated, patient does not appear to have chest pain.  Echo as above  Hypertension - Resume as BP tolerates  Insulin -dependent diabetes - Sliding scale insulin  with basal/bolus titration as needed-hemoglobin A1c 7.2%  Obesity BMI 34 -  Complicates the above care plan given necessity of surgical intervention - Outpatient follow up for lifestyle modification and risk factor management  Advanced dementia - Family reports current mentation is at baseline.  Patient is nonverbal. - Continue memantadine  DVT prophylaxis: SCDs   Code Status: Full Code Disposition: Inpatient,  still hospitalized for sepsis management.  Currently on IV antibiotics with perc chole tube.  Trending CBC.   Consultants:  Treatment Team:  Consulting Physician: Conrado Delay, DO  Procedures:  Cystostomy tube 4/16  Antimicrobials:  Anti-infectives (From admission, onward)    Start     Dose/Rate Route Frequency Ordered Stop   07/05/23 1130  cefOXitin  (MEFOXIN ) 2 g in sodium chloride  0.9 % 100 mL IVPB        2 g 200 mL/hr over 30 Minutes Intravenous To Radiology 07/05/23 1118 07/05/23 1659   07/05/23 0900  piperacillin -tazobactam (ZOSYN ) IVPB 3.375 g        3.375 g 12.5 mL/hr over 240 Minutes Intravenous Every 8 hours 07/05/23 0805     07/05/23 0230  ceFEPIme  (MAXIPIME ) 2 g in sodium chloride  0.9 % 100 mL IVPB        2 g 200 mL/hr over 30 Minutes Intravenous  Once 07/05/23 0219 07/05/23 0303   07/05/23 0230  metroNIDAZOLE  (FLAGYL ) IVPB 500 mg        500 mg 100 mL/hr over 60 Minutes Intravenous  Once 07/05/23 0219 07/05/23 0347   07/05/23 0230  vancomycin  (VANCOREADY) IVPB 2000 mg/400 mL        2,000 mg 200 mL/hr over 120 Minutes Intravenous  Once 07/05/23 0227 07/05/23 0616       Data Reviewed: I have personally reviewed following labs and imaging studies CBC: Recent Labs  Lab 07/05/23 0145 07/06/23 0505 07/07/23 0523  WBC 27.1* 17.5* 17.5*  NEUTROABS 22.5*  --  13.3*  HGB 11.9* 10.3* 10.0*  HCT 36.7* 30.9* 30.2*  MCV 89.3 86.8 87.0  PLT 346 326 343   Basic Metabolic Panel: Recent Labs  Lab 07/05/23 0145 07/06/23 0505 07/07/23 0523  NA 132* 135 138  K 3.9 3.3* 3.4*  CL 96* 103 100  CO2 22 23 24   GLUCOSE 215* 147* 152*  BUN 20 17 16   CREATININE 1.78* 1.02 0.87  CALCIUM  8.4* 8.1* 8.4*  MG  --   --  2.1  PHOS  --   --  3.5   GFR: Estimated Creatinine Clearance: 86.5 mL/min (by C-G formula based on SCr of 0.87 mg/dL). Liver Function Tests: Recent Labs  Lab 07/05/23 0145 07/06/23 0505 07/07/23 0523  AST 23 19 33  ALT 15 14 18   ALKPHOS 72 57 61   BILITOT 1.1 0.9 0.9  PROT 7.3 6.5 6.3*  ALBUMIN 2.9* 2.5* 2.3*   CBG: Recent Labs  Lab 07/06/23 1123 07/06/23 1522 07/06/23 1630 07/06/23 2128 07/07/23 0757  GLUCAP 143* 148* 141* 124* 153*    Recent Results (from the past 240 hours)  Blood Culture (routine x 2)     Status: None   Collection Time: 06/28/23  8:56 PM   Specimen: BLOOD  Result Value Ref Range Status   Specimen Description BLOOD BLOOD LEFT HAND  Final   Special Requests   Final    BOTTLES DRAWN AEROBIC AND ANAEROBIC Blood Culture adequate volume   Culture   Final    NO GROWTH 5 DAYS Performed at [facility], [address] [place], [place] [address]    Report Status 07/03/2023 FINAL  Final  Blood Culture (routine x 2)     Status: None   Collection Time: 06/28/23  8:57 PM   Specimen: BLOOD  Result Value Ref Range Status   Specimen Description BLOOD BLOOD RIGHT ARM  Final   Special Requests   Final    BOTTLES DRAWN AEROBIC AND ANAEROBIC Blood Culture adequate volume   Culture   Final    NO GROWTH 5 DAYS Performed at [facility], [address] [place], [place] [address]    Report Status 07/03/2023 FINAL  Final  Resp panel by RT-PCR (RSV, Flu A&B, Covid) Anterior Nasal Swab     Status: None   Collection Time: 06/28/23  9:15 PM   Specimen: Anterior Nasal Swab  Result Value Ref Range Status   SARS Coronavirus 2 by RT PCR NEGATIVE NEGATIVE Final    Comment: (NOTE) SARS-CoV-2 target nucleic acids are NOT DETECTED.  The SARS-CoV-2 RNA is generally detectable in upper respiratory specimens during the acute phase of infection. The lowest concentration of SARS-CoV-2 viral copies this assay can detect is 138 copies/mL. A negative result does not preclude SARS-Cov-2 infection and should not be used as the sole basis for treatment or other patient management decisions. A negative result may occur with  improper specimen collection/handling, submission of specimen other than  nasopharyngeal swab, presence of viral mutation(s) within the areas targeted by this assay, and inadequate number of viral copies(<138 copies/mL). A negative result must be combined with clinical observations, patient history, and epidemiological information. The expected result is Negative.  Fact Sheet for Patients:  BloggerCourse.com  Fact Sheet for Healthcare Providers:  SeriousBroker.it  This test is no t yet approved or cleared by the [place]  FDA and  has been authorized for detection and/or diagnosis of SARS-CoV-2 by FDA under an Emergency Use Authorization (EUA). This EUA will remain  in effect (meaning this test can be used) for the duration of the COVID-19 declaration under Section 564(b)(1) of the Act, 21 U.S.C.section 360bbb-3(b)(1), unless the authorization is terminated  or revoked sooner.       Influenza A by PCR NEGATIVE NEGATIVE Final   Influenza B by PCR NEGATIVE NEGATIVE Final    Comment: (NOTE) The Xpert Xpress SARS-CoV-2/FLU/RSV plus assay is intended as an aid in the diagnosis of influenza from Nasopharyngeal swab specimens and should not be used as a sole basis for treatment. Nasal washings and aspirates are unacceptable for Xpert Xpress SARS-CoV-2/FLU/RSV testing.  Fact Sheet for Patients: BloggerCourse.com  Fact Sheet for Healthcare Providers: SeriousBroker.it  This test is not yet approved or cleared by the [place]  FDA and has been authorized for detection and/or diagnosis of SARS-CoV-2 by FDA under an Emergency Use Authorization (EUA). This EUA will remain in effect (meaning this test can be used) for the duration of the COVID-19 declaration under Section 564(b)(1) of the Act, 21 U.S.C. section 360bbb-3(b)(1), unless the authorization is terminated or revoked.     Resp Syncytial Virus by PCR NEGATIVE NEGATIVE Final    Comment:  (NOTE) Fact Sheet for Patients: BloggerCourse.com  Fact Sheet for Healthcare Providers: SeriousBroker.it  This test is not yet approved or cleared by the [place]  FDA and has been authorized for detection and/or diagnosis of SARS-CoV-2 by FDA under an Emergency Use Authorization (EUA). This EUA will remain in effect (meaning this test can be used) for the duration of the COVID-19 declaration under Section 564(b)(1) of the Act, 21 U.S.C. section 360bbb-3(b)(1), unless the authorization is terminated or revoked.  Performed  at [facility], [address] [place], [place] [address]   Blood Culture (routine x 2)     Status: None (Preliminary result)   Collection Time: 07/05/23  1:25 AM   Specimen: BLOOD RIGHT ARM  Result Value Ref Range Status   Specimen Description   Final    BLOOD RIGHT ARM Performed at [facility], [address], [place], [place] [address]    Special Requests   Final    BOTTLES DRAWN AEROBIC AND ANAEROBIC Blood Culture adequate volume   Culture  Setup Time PENDING  Incomplete   Culture   Final    NO GROWTH 2 DAYS Performed at [facility], [address] [place], [place] [address]    Report Status PENDING  Incomplete  Blood Culture (routine x 2)     Status: None (Preliminary result)   Collection Time: 07/05/23  1:25 AM   Specimen: BLOOD  Result Value Ref Range Status   Specimen Description BLOOD LAC  Final   Special Requests   Final    BOTTLES DRAWN AEROBIC AND ANAEROBIC Blood Culture adequate volume   Culture   Final    NO GROWTH 2 DAYS Performed at [facility], [address], [place], [place] [address]    Report Status PENDING  Incomplete  Resp panel by RT-PCR (RSV, Flu A&B, Covid) Anterior Nasal Swab     Status: None   Collection Time: 07/05/23  1:48 AM   Specimen: Anterior Nasal Swab  Result Value Ref Range Status   SARS Coronavirus 2 by RT PCR NEGATIVE  NEGATIVE Final    Comment: (NOTE) SARS-CoV-2 target nucleic acids are NOT DETECTED.  The SARS-CoV-2 RNA is generally detectable in upper respiratory specimens during the acute phase of infection. The lowest concentration of SARS-CoV-2 viral copies this assay can detect is 138 copies/mL. A negative result does not preclude SARS-Cov-2 infection and should not be used as the sole basis for treatment or other patient management decisions. A negative result may occur with  improper specimen collection/handling, submission of specimen other than nasopharyngeal swab, presence of viral mutation(s) within the areas targeted by this assay, and inadequate number of viral copies(<138 copies/mL). A negative result must be combined with clinical observations, patient history, and epidemiological information. The expected result is Negative.  Fact Sheet for Patients:  BloggerCourse.com  Fact Sheet for Healthcare Providers:  SeriousBroker.it  This test is no t yet approved or cleared by the [place]  FDA and  has been authorized for detection and/or diagnosis of SARS-CoV-2 by FDA under an Emergency Use Authorization (EUA). This EUA will remain  in effect (meaning this test can be used) for the duration of the COVID-19 declaration under Section 564(b)(1) of the Act, 21 U.S.C.section 360bbb-3(b)(1), unless the authorization is terminated  or revoked sooner.       Influenza A by PCR NEGATIVE NEGATIVE Final   Influenza B by PCR NEGATIVE NEGATIVE Final    Comment: (NOTE) The Xpert Xpress SARS-CoV-2/FLU/RSV plus assay is intended as an aid in the diagnosis of influenza from Nasopharyngeal swab specimens and should not be used as a sole basis for treatment. Nasal washings and aspirates are unacceptable for Xpert Xpress SARS-CoV-2/FLU/RSV testing.  Fact Sheet for Patients: BloggerCourse.com  Fact Sheet for Healthcare  Providers: SeriousBroker.it  This test is not yet approved or cleared by the [place]  FDA and has been authorized for detection and/or diagnosis of SARS-CoV-2 by FDA under an Emergency Use Authorization (EUA). This  EUA will remain in effect (meaning this test can be used) for the duration of the COVID-19 declaration under Section 564(b)(1) of the Act, 21 U.S.C. section 360bbb-3(b)(1), unless the authorization is terminated or revoked.     Resp Syncytial Virus by PCR NEGATIVE NEGATIVE Final    Comment: (NOTE) Fact Sheet for Patients: BloggerCourse.com  Fact Sheet for Healthcare Providers: SeriousBroker.it  This test is not yet approved or cleared by the [place]  FDA and has been authorized for detection and/or diagnosis of SARS-CoV-2 by FDA under an Emergency Use Authorization (EUA). This EUA will remain in effect (meaning this test can be used) for the duration of the COVID-19 declaration under Section 564(b)(1) of the Act, 21 U.S.C. section 360bbb-3(b)(1), unless the authorization is terminated or revoked.  Performed at [facility], [address] [place], [place] [address]   Urine Culture     Status: None   Collection Time: 07/05/23  3:08 AM   Specimen: Urine, Random  Result Value Ref Range Status   Specimen Description   Final    URINE, RANDOM Performed at [facility], [address], [place], [place] [address]    Special Requests   Final    NONE Reflexed from [phone] Performed at [facility], [address], [place], [place] [address]    Culture   Final    NO GROWTH Performed at [facility], [address] [place], [place] [address]    Report Status 07/06/2023 FINAL  Final  Aerobic/Anaerobic Culture w Gram Stain (surgical/deep wound)     Status: None (Preliminary result)   Collection Time: 07/05/23  4:26 PM   Specimen: Abscess  Result  Value Ref Range Status   Specimen Description   Final    ABSCESS Performed at [facility], [address], [place], [place] [address]    Special Requests   Final     GALLBLADDER Performed at [facility], [address] [place], [place] [address]    Gram Stain   Final    MODERATE WBC PRESENT,BOTH PMN AND MONONUCLEAR FEW GRAM NEGATIVE RODS    Culture   Final    ABUNDANT GRAM NEGATIVE RODS SUSCEPTIBILITIES TO FOLLOW Performed at [facility], [address] [place], [place] [address]    Report Status PENDING  Incomplete     Radiology Studies: DG Abd Portable 1V Result Date: 07/06/2023 CLINICAL DATA:  Abdominal distension EXAM: PORTABLE ABDOMEN - 1 VIEW COMPARISON:  CT abdomen and pelvis dated 07/05/2023 FINDINGS: Percutaneous cholecystostomy drain in-situ. Mild gas-filled dilation of right lower quadrant colon. Mild gas-filled dilation of the stomach. Remainder bowel gas appears nonobstructive. No free air or pneumatosis. No abnormal radio-opaque calculi or mass effect. No acute or substantial osseous abnormality. The sacrum and coccyx are partially obscured by overlying bowel contents. IMPRESSION: 1. Mild gas-filled dilation of the stomach and right lower quadrant colon, which may represent ileus. Remainder bowel gas appears nonobstructive. 2. Percutaneous cholecystostomy drain in-situ. Electronically Signed   By: Limin  Xu M.D.   On: 07/06/2023 15:18   DG Chest 1 View Result Date: 07/06/2023 CLINICAL DATA:  78 year old male with congestive heart failure. EXAM: CHEST  1 VIEW COMPARISON:  CT Chest, Abdomen, and Pelvis 0354 hours today. FINDINGS: Portable AP semi upright view at 0612 hours. Stable low lung volumes, cardiomegaly and mediastinal contours. No pneumothorax or pulmonary edema. Left lung subpleural opacity better demonstrated by CT and resembling atelectasis or scarring. Stable ventilation from the CT  this morning. Visualized tracheal air column is  within normal limits. Stable visualized osseous structures. Stable visible bowel gas pattern. IMPRESSION: Stable low lung volumes with subpleural left lung atelectasis or scarring as per CT this morning. No new cardiopulmonary abnormality. Electronically Signed   By: Marlise Simpers M.D.   On: 07/06/2023 06:44   IR Perc Cholecystostomy Result Date: 07/05/2023 INDICATION: 78 year old with sepsis and acute cholecystitis. Patient is not a surgical candidate. EXAM: PLACEMENT OF PERCUTANEOUS CHOLECYSTOSTOMY TUBE USING ULTRASOUND AND FLUOROSCOPIC GUIDANCE MEDICATIONS: Fentanyl  100 mcg ANESTHESIA/SEDATION: The patient's level of consciousness and vital signs were monitored continuously by radiology nursing throughout the procedure under my direct supervision. FLUOROSCOPY TIME:  Radiation Exposure Index (as provided by the fluoroscopic device): 28 mGy Kerma COMPLICATIONS: None immediate. PROCEDURE: Patient has severe dementia and cannot communicate. Informed written consent was obtained from the patient's wife after a thorough discussion of the procedural risks, benefits and alternatives. All questions were addressed. Maximal Sterile Barrier Technique was utilized including caps, mask, sterile gowns, sterile gloves, sterile drape, hand hygiene and skin antiseptic. A timeout was performed prior to the initiation of the procedure. Patient was placed supine on the interventional table. The gallbladder was identified with ultrasound. Ultrasound image was saved for documentation. Right upper abdomen was prepped with chlorhexidine  and sterile field was created. Skin was anesthetized using 1% lidocaine . A small incision was made. Using ultrasound guidance, a 21 gauge needle was directed into the gallbladder via a transhepatic approach. 0.018 wire was advanced into the gallbladder using fluoroscopic guidance. A transitional dilator set was placed and purulent fluid was aspirated. J wire was placed and the tract was dilated to  accommodate a 10 [place] multipurpose drain. Approximately 40 mL of purulent fluid was aspirated from the gallbladder. Small amount of contrast was injected to confirm placement as well. Drain was sutured to skin and attached to a gravity bag. Dressing was placed. Fluoroscopic and ultrasound images were taken and saved for documentation. FINDINGS: Mild distention of the gallbladder. Drain was placed using a transhepatic approach. 40 mL of purulent fluid was removed from the gallbladder. IMPRESSION: Successful percutaneous cholecystostomy tube placement using ultrasound and fluoroscopic guidance. Gallbladder fluid was sent for culture. Electronically Signed   By: Elene Griffes M.D.   On: 07/05/2023 17:43    Scheduled Meds:  aspirin  EC  81 mg Oral Daily   atorvastatin   20 mg Oral Daily   enoxaparin  (LOVENOX ) injection  50 mg Subcutaneous Q24H   escitalopram   5 mg Oral Daily   galantamine   8 mg Oral BID WC   insulin  aspart  0-20 Units Subcutaneous TID WC   memantine   10 mg Oral BID   ondansetron  (ZOFRAN ) IV  4 mg Intravenous Once   sodium chloride  flush  5 mL Intracatheter Q8H   Continuous Infusions:  piperacillin -tazobactam (ZOSYN )  IV 3.375 g (07/07/23 0955)     LOS: 2 days  MDM: Patient is high risk for one or more organ failure.  They necessitate ongoing hospitalization for continued IV therapies and subsequent lab monitoring. Total time spent interpreting labs and vitals, reviewing the medical record, coordinating care amongst consultants and care team members, directly assessing and discussing care with the patient and/or family: 55 min  Renalda Locklin, DO [facility]  To contact the attending physician between 7A-7P please use Epic Chat. To contact the covering physician during after hours 7P-7A, please review Amion.  07/07/2023, 10:06 AM   *This document has been created with the assistance of dictation software.  Please excuse typographical errors. *

## 2023-07-07 NOTE — Plan of Care (Signed)

## 2023-07-08 DIAGNOSIS — A419 Sepsis, unspecified organism: Secondary | ICD-10-CM | POA: Diagnosis not present

## 2023-07-08 DIAGNOSIS — K810 Acute cholecystitis: Secondary | ICD-10-CM | POA: Diagnosis not present

## 2023-07-08 DIAGNOSIS — R6520 Severe sepsis without septic shock: Secondary | ICD-10-CM | POA: Diagnosis not present

## 2023-07-08 LAB — COMPREHENSIVE METABOLIC PANEL WITH GFR
ALT: 32 U/L (ref 0–44)
AST: 66 U/L — ABNORMAL HIGH (ref 15–41)
Albumin: 2.3 g/dL — ABNORMAL LOW (ref 3.5–5.0)
Alkaline Phosphatase: 77 U/L (ref 38–126)
Anion gap: 6 (ref 5–15)
BUN: 13 mg/dL (ref 8–23)
CO2: 26 mmol/L (ref 22–32)
Calcium: 8.1 mg/dL — ABNORMAL LOW (ref 8.9–10.3)
Chloride: 104 mmol/L (ref 98–111)
Creatinine, Ser: 0.9 mg/dL (ref 0.61–1.24)
GFR, Estimated: >60 mL/min (ref >60)
Glucose, Bld: 156 mg/dL — ABNORMAL HIGH (ref 70–99)
Potassium: 3.5 mmol/L (ref 3.5–5.1)
Sodium: 136 mmol/L (ref 135–145)
Total Bilirubin: 0.8 mg/dL (ref 0.0–1.2)
Total Protein: 6.6 g/dL (ref 6.5–8.1)

## 2023-07-08 LAB — CBC WITH DIFFERENTIAL/PLATELET
Abs Immature Granulocytes: 0.8 10*3/uL — ABNORMAL HIGH (ref 0.00–0.07)
Basophils Absolute: 0.1 10*3/uL (ref 0.0–0.1)
Basophils Relative: 1 %
Eosinophils Absolute: 0.8 10*3/uL — ABNORMAL HIGH (ref 0.0–0.5)
Eosinophils Relative: 5 %
HCT: 31.6 % — ABNORMAL LOW (ref 39.0–52.0)
Hemoglobin: 10.4 g/dL — ABNORMAL LOW (ref 13.0–17.0)
Immature Granulocytes: 5 %
Lymphocytes Relative: 11 %
Lymphs Abs: 1.8 10*3/uL (ref 0.7–4.0)
MCH: 29.5 pg (ref 26.0–34.0)
MCHC: 32.9 g/dL (ref 30.0–36.0)
MCV: 89.5 fL (ref 80.0–100.0)
Monocytes Absolute: 1.3 10*3/uL — ABNORMAL HIGH (ref 0.1–1.0)
Monocytes Relative: 8 %
Neutro Abs: 11.4 10*3/uL — ABNORMAL HIGH (ref 1.7–7.7)
Neutrophils Relative %: 70 %
Platelets: 376 10*3/uL (ref 150–400)
RBC: 3.53 MIL/uL — ABNORMAL LOW (ref 4.22–5.81)
RDW: 13.8 % (ref 11.5–15.5)
WBC: 16.2 10*3/uL — ABNORMAL HIGH (ref 4.0–10.5)
nRBC: 0 % (ref 0.0–0.2)

## 2023-07-08 LAB — CULTURE, BLOOD (ROUTINE X 2)

## 2023-07-08 LAB — MAGNESIUM: Magnesium: 1.8 mg/dL (ref 1.7–2.4)

## 2023-07-08 LAB — GLUCOSE, CAPILLARY
Glucose-Capillary: 157 mg/dL — ABNORMAL HIGH (ref 70–99)
Glucose-Capillary: 167 mg/dL — ABNORMAL HIGH (ref 70–99)
Glucose-Capillary: 127 mg/dL — ABNORMAL HIGH (ref 70–99)
Glucose-Capillary: 196 mg/dL — ABNORMAL HIGH (ref 70–99)

## 2023-07-08 LAB — PHOSPHORUS: Phosphorus: 3.6 mg/dL (ref 2.5–4.6)

## 2023-07-08 MED ORDER — TAMSULOSIN HCL 0.4 MG PO CAPS
0.4000 mg | ORAL_CAPSULE | Freq: Every day | ORAL | Status: DC
  Administered 2023-07-09: 0.4 mg via ORAL
  Filled 2023-07-08 (×2): qty 1

## 2023-07-08 MED ORDER — ENSURE ENLIVE PO LIQD
237.0000 mL | Freq: Two times a day (BID) | ORAL | Status: DC
  Administered 2023-07-10 – 2023-07-12 (×3): 237 mL via ORAL

## 2023-07-08 NOTE — Plan of Care (Signed)
  Problem: Clinical Measurements: Goal: Ability to maintain clinical measurements within normal limits will improve Outcome: Progressing Goal: Will remain free from infection Outcome: Progressing Goal: Respiratory complications will improve Outcome: Progressing Goal: Cardiovascular complication will be avoided Outcome: Progressing   Problem: Activity: Goal: Risk for activity intolerance will decrease Outcome: Progressing   Problem: Safety: Goal: Ability to remain free from injury will improve Outcome: Progressing

## 2023-07-08 NOTE — Progress Notes (Signed)
 Patient with stable vitals, in bed resting. Patient drowsy, but easily aroused. Patient more alert this morning than previously per family. Patient's wife stated patient looks to be at his baseline. Discussed patient safety and aspiration precuations with family.   Patient with no output. Bladder scanned. 350. MD made aware.  Will continue to monitor.

## 2023-07-08 NOTE — Plan of Care (Signed)
  Problem: Metabolic: Goal: Ability to maintain appropriate glucose levels will improve Outcome: Progressing   Problem: Nutritional: Goal: Maintenance of adequate nutrition will improve Outcome: Progressing   Problem: Skin Integrity: Goal: Risk for impaired skin integrity will decrease Outcome: Progressing   Problem: Clinical Measurements: Goal: Ability to maintain clinical measurements within normal limits will improve Outcome: Progressing Goal: Diagnostic test results will improve Outcome: Progressing

## 2023-07-08 NOTE — Progress Notes (Signed)
 PROGRESS NOTE    Jeff Key  ZOX:[id number] DOB: 1945-11-28 DOA: 07/05/2023 PCP: Antonio Baumgarten, MD  Chief Complaint  Patient presents with   Emesis    Hospital Course:  Jeff Key is 78 y.o. male with advanced dementia, nonverbal at baseline, CAD, ischemic cardiomyopathy, chronic heart failure reduced EF, chronic ulcerative proctosigmoiditis, insulin -dependent diabetes, obesity, who was brought in by family member due to worsening GI symptoms with nausea, vomiting, fever, abdominal pain.  Workup in ED revealed stable vital signs, increased lactic acidosis of 3.5, creatinine 1.7, leukocytosis 27, CT abdomen pelvis with signs of acute cholecystitis.  Patient was started on broad-spectrum antibiotics and given a 2 L IV bolus.  General surgery was consulted and recommended cholecystotomy tube given patient has been on antiplatelet therapy  Subjective: Tmax 100 overnight.  He is more alert today.  Tolerating a full liquid diet and his wife is pleased with his improvement.  Objective: Vitals:   07/07/23 2110 07/08/23 0018 07/08/23 0553 07/08/23 0800  BP: (!) 148/70 134/66 (!) 150/87 134/71  Pulse: 73 76 (!) 109 79  Resp: 20 20 18 20   Temp: 100 F (37.8 C) 98.9 F (37.2 C) 98.5 F (36.9 C)   TempSrc: Oral Oral Oral   SpO2: 92% 95% 93% 95%  Weight:      Height:        Intake/Output Summary (Last 24 hours) at 07/08/2023 0945 Last data filed at 07/08/2023 0539 Gross per 24 hour  Intake 1070 ml  Output 660 ml  Net 410 ml   Filed Weights   07/05/23 0124 07/05/23 1452  Weight: 108.9 kg 108.9 kg    Examination: General exam: Appears calm and comfortable, NAD  Respiratory system: No work of breathing, symmetric chest wall expansion Cardiovascular system: S1 & S2 heard, RRR.  Gastrointestinal system: Abdomen is distended, drain in place with serosanguineous fluid,+ bowel sounds Neuro: Alert, nonverbal, does not participate in exam or follow commands Skin: No rashes,  lesions Psychiatry: calm, cannot assess otherwise  Assessment & Plan:  Principal Problem:   Severe sepsis (HCC) Active Problems:   Sepsis (HCC)   Acute cholecystitis    Sepsis with acute endorgan damage Acute cholecystitis - Sepsis criteria met with: Leukocytosis, lactic acidosis, AKI endorgan damage, infection: Acute cholecystitis - No further fever overnight - Patient's pain appears well-controlled - She is tolerating a liquid diet - Will continue with Zosyn  for now, cultures are growing E. coli with some resistance though sensitivity to ceftriaxone .  Will likely discharge with cefpodoxime. - Continue to monitor closely.  WBC is downtrending but has not returned to normal - If WBC is persistently elevated tomorrow we will consider repeat CT scan -Appreciate further general surgery recommendations - continue serial abdominal exams, low threshold for reimaging  AKI - Secondary to sepsis - Continue to trend CMP - Creatinine has resolved to baseline now  Urinary retention - Continue with bladder scans, patient has been sleeping - Straight cath as needed - If straight cath more than twice will proceed with Foley insertion - Consider initiation of Flomax  if requiring straight cath  Hypokalemia - Replace as needed  History of CAD Ischemic cardiomyopathy Chronic heart failure reduced EF - Clinically euvolemic at this time - Echo: LVEF 55 to 60%, grade 1 diastolic dysfunction.  Dilation of aortic root, - Status post 3 L IV fluids in the ED. - GDMT as BP tolerates - Troponin slightly elevated, patient does not appear to have chest pain.  Echo as above  Hypertension - Resume as BP tolerates  Insulin -dependent diabetes - Sliding scale insulin  with basal/bolus titration as needed-hemoglobin A1c 7.2%  Obesity BMI 34 - Complicates the above care plan given necessity of surgical intervention - Outpatient follow up for lifestyle modification and risk factor  management  Advanced dementia - Family reports current mentation is at baseline.  Patient is nonverbal. - Continue memantadine  DVT prophylaxis: SCDs   Code Status: Full Code Disposition: Inpatient, still hospitalized for sepsis management.  Currently on IV antibiotics with perc chole tube.  Trending CBC.   Consultants:  Treatment Team:  Consulting Physician: Conrado Delay, DO  Procedures:  Cystostomy tube 4/16  Antimicrobials:  Anti-infectives (From admission, onward)    Start     Dose/Rate Route Frequency Ordered Stop   07/05/23 1130  cefOXitin  (MEFOXIN ) 2 g in sodium chloride  0.9 % 100 mL IVPB        2 g 200 mL/hr over 30 Minutes Intravenous To Radiology 07/05/23 1118 07/05/23 1659   07/05/23 0900  piperacillin -tazobactam (ZOSYN ) IVPB 3.375 g        3.375 g 12.5 mL/hr over 240 Minutes Intravenous Every 8 hours 07/05/23 0805     07/05/23 0230  ceFEPIme  (MAXIPIME ) 2 g in sodium chloride  0.9 % 100 mL IVPB        2 g 200 mL/hr over 30 Minutes Intravenous  Once 07/05/23 0219 07/05/23 0303   07/05/23 0230  metroNIDAZOLE  (FLAGYL ) IVPB 500 mg        500 mg 100 mL/hr over 60 Minutes Intravenous  Once 07/05/23 0219 07/05/23 0347   07/05/23 0230  vancomycin  (VANCOREADY) IVPB 2000 mg/400 mL        2,000 mg 200 mL/hr over 120 Minutes Intravenous  Once 07/05/23 0227 07/05/23 0616       Data Reviewed: I have personally reviewed following labs and imaging studies CBC: Recent Labs  Lab 07/05/23 0145 07/06/23 0505 07/07/23 0523 07/08/23 0533  WBC 27.1* 17.5* 17.5* 16.2*  NEUTROABS 22.5*  --  13.3* 11.4*  HGB 11.9* 10.3* 10.0* 10.4*  HCT 36.7* 30.9* 30.2* 31.6*  MCV 89.3 86.8 87.0 89.5  PLT 346 326 343 376   Basic Metabolic Panel: Recent Labs  Lab 07/05/23 0145 07/06/23 0505 07/07/23 0523 07/08/23 0533  NA 132* 135 138 136  K 3.9 3.3* 3.4* 3.5  CL 96* 103 100 104  CO2 22 23 24 26   GLUCOSE 215* 147* 152* 156*  BUN 20 17 16 13   CREATININE 1.78* 1.02 0.87 0.90   CALCIUM  8.4* 8.1* 8.4* 8.1*  MG  --   --  2.1 1.8  PHOS  --   --  3.5 3.6   GFR: Estimated Creatinine Clearance: 83.6 mL/min (by C-G formula based on SCr of 0.9 mg/dL). Liver Function Tests: Recent Labs  Lab 07/05/23 0145 07/06/23 0505 07/07/23 0523 07/08/23 0533  AST 23 19 33 66*  ALT 15 14 18  32  ALKPHOS 72 57 61 77  BILITOT 1.1 0.9 0.9 0.8  PROT 7.3 6.5 6.3* 6.6  ALBUMIN 2.9* 2.5* 2.3* 2.3*   CBG: Recent Labs  Lab 07/07/23 0757 07/07/23 1220 07/07/23 1624 07/07/23 2111 07/08/23 0828  GLUCAP 153* 140* 136* 173* 157*    Recent Results (from the past 240 hours)  Blood Culture (routine x 2)     Status: None   Collection Time: 06/28/23  8:56 PM   Specimen: BLOOD  Result Value Ref Range Status   Specimen Description BLOOD BLOOD LEFT HAND  Final  Special Requests   Final    BOTTLES DRAWN AEROBIC AND ANAEROBIC Blood Culture adequate volume   Culture   Final    NO GROWTH 5 DAYS Performed at [facility], [address] [place], [place] [address]    Report Status 07/03/2023 FINAL  Final  Blood Culture (routine x 2)     Status: None   Collection Time: 06/28/23  8:57 PM   Specimen: BLOOD  Result Value Ref Range Status   Specimen Description BLOOD BLOOD RIGHT ARM  Final   Special Requests   Final    BOTTLES DRAWN AEROBIC AND ANAEROBIC Blood Culture adequate volume   Culture   Final    NO GROWTH 5 DAYS Performed at [facility], [address] [place], [place] [address]    Report Status 07/03/2023 FINAL  Final  Resp panel by RT-PCR (RSV, Flu A&B, Covid) Anterior Nasal Swab     Status: None   Collection Time: 06/28/23  9:15 PM   Specimen: Anterior Nasal Swab  Result Value Ref Range Status   SARS Coronavirus 2 by RT PCR NEGATIVE NEGATIVE Final    Comment: (NOTE) SARS-CoV-2 target nucleic acids are NOT DETECTED.  The SARS-CoV-2 RNA is generally detectable in upper respiratory specimens during the acute phase of infection. The  lowest concentration of SARS-CoV-2 viral copies this assay can detect is 138 copies/mL. A negative result does not preclude SARS-Cov-2 infection and should not be used as the sole basis for treatment or other patient management decisions. A negative result may occur with  improper specimen collection/handling, submission of specimen other than nasopharyngeal swab, presence of viral mutation(s) within the areas targeted by this assay, and inadequate number of viral copies(<138 copies/mL). A negative result must be combined with clinical observations, patient history, and epidemiological information. The expected result is Negative.  Fact Sheet for Patients:  BloggerCourse.com  Fact Sheet for Healthcare Providers:  SeriousBroker.it  This test is no t yet approved or cleared by the [place]  FDA and  has been authorized for detection and/or diagnosis of SARS-CoV-2 by FDA under an Emergency Use Authorization (EUA). This EUA will remain  in effect (meaning this test can be used) for the duration of the COVID-19 declaration under Section 564(b)(1) of the Act, 21 U.S.C.section 360bbb-3(b)(1), unless the authorization is terminated  or revoked sooner.       Influenza A by PCR NEGATIVE NEGATIVE Final   Influenza B by PCR NEGATIVE NEGATIVE Final    Comment: (NOTE) The Xpert Xpress SARS-CoV-2/FLU/RSV plus assay is intended as an aid in the diagnosis of influenza from Nasopharyngeal swab specimens and should not be used as a sole basis for treatment. Nasal washings and aspirates are unacceptable for Xpert Xpress SARS-CoV-2/FLU/RSV testing.  Fact Sheet for Patients: BloggerCourse.com  Fact Sheet for Healthcare Providers: SeriousBroker.it  This test is not yet approved or cleared by the [place]  FDA and has been authorized for detection and/or diagnosis of SARS-CoV-2 by FDA under  an Emergency Use Authorization (EUA). This EUA will remain in effect (meaning this test can be used) for the duration of the COVID-19 declaration under Section 564(b)(1) of the Act, 21 U.S.C. section 360bbb-3(b)(1), unless the authorization is terminated or revoked.     Resp Syncytial Virus by PCR NEGATIVE NEGATIVE Final    Comment: (NOTE) Fact Sheet for Patients: BloggerCourse.com  Fact Sheet for Healthcare Providers: SeriousBroker.it  This test is not yet approved or cleared by the [place]  FDA and has been authorized  for detection and/or diagnosis of SARS-CoV-2 by FDA under an Emergency Use Authorization (EUA). This EUA will remain in effect (meaning this test can be used) for the duration of the COVID-19 declaration under Section 564(b)(1) of the Act, 21 U.S.C. section 360bbb-3(b)(1), unless the authorization is terminated or revoked.  Performed at [facility], [address] [place], [place] [address]   Blood Culture (routine x 2)     Status: None (Preliminary result)   Collection Time: 07/05/23  1:25 AM   Specimen: BLOOD RIGHT ARM  Result Value Ref Range Status   Specimen Description   Final    BLOOD RIGHT ARM Performed at [facility], [address], [place], [place] [address]    Special Requests   Final    BOTTLES DRAWN AEROBIC AND ANAEROBIC Blood Culture adequate volume   Culture  Setup Time PENDING  Incomplete   Culture   Final    NO GROWTH 3 DAYS Performed at [facility], [address] [place], [place] [address]    Report Status PENDING  Incomplete  Blood Culture (routine x 2)     Status: None (Preliminary result)   Collection Time: 07/05/23  1:25 AM   Specimen: BLOOD  Result Value Ref Range Status   Specimen Description BLOOD LAC  Final   Special Requests   Final    BOTTLES DRAWN AEROBIC AND ANAEROBIC Blood Culture adequate volume   Culture   Final    NO GROWTH 3  DAYS Performed at [facility], [address], [place], [place] [address]    Report Status PENDING  Incomplete  Resp panel by RT-PCR (RSV, Flu A&B, Covid) Anterior Nasal Swab     Status: None   Collection Time: 07/05/23  1:48 AM   Specimen: Anterior Nasal Swab  Result Value Ref Range Status   SARS Coronavirus 2 by RT PCR NEGATIVE NEGATIVE Final    Comment: (NOTE) SARS-CoV-2 target nucleic acids are NOT DETECTED.  The SARS-CoV-2 RNA is generally detectable in upper respiratory specimens during the acute phase of infection. The lowest concentration of SARS-CoV-2 viral copies this assay can detect is 138 copies/mL. A negative result does not preclude SARS-Cov-2 infection and should not be used as the sole basis for treatment or other patient management decisions. A negative result may occur with  improper specimen collection/handling, submission of specimen other than nasopharyngeal swab, presence of viral mutation(s) within the areas targeted by this assay, and inadequate number of viral copies(<138 copies/mL). A negative result must be combined with clinical observations, patient history, and epidemiological information. The expected result is Negative.  Fact Sheet for Patients:  BloggerCourse.com  Fact Sheet for Healthcare Providers:  SeriousBroker.it  This test is no t yet approved or cleared by the [place]  FDA and  has been authorized for detection and/or diagnosis of SARS-CoV-2 by FDA under an Emergency Use Authorization (EUA). This EUA will remain  in effect (meaning this test can be used) for the duration of the COVID-19 declaration under Section 564(b)(1) of the Act, 21 U.S.C.section 360bbb-3(b)(1), unless the authorization is terminated  or revoked sooner.       Influenza A by PCR NEGATIVE NEGATIVE Final   Influenza B by PCR NEGATIVE NEGATIVE Final    Comment: (NOTE) The Xpert Xpress  SARS-CoV-2/FLU/RSV plus assay is intended as an aid in the diagnosis of influenza from Nasopharyngeal swab specimens and should not be used as a sole basis for treatment. Nasal washings and aspirates are  unacceptable for Xpert Xpress SARS-CoV-2/FLU/RSV testing.  Fact Sheet for Patients: BloggerCourse.com  Fact Sheet for Healthcare Providers: SeriousBroker.it  This test is not yet approved or cleared by the [place]  FDA and has been authorized for detection and/or diagnosis of SARS-CoV-2 by FDA under an Emergency Use Authorization (EUA). This EUA will remain in effect (meaning this test can be used) for the duration of the COVID-19 declaration under Section 564(b)(1) of the Act, 21 U.S.C. section 360bbb-3(b)(1), unless the authorization is terminated or revoked.     Resp Syncytial Virus by PCR NEGATIVE NEGATIVE Final    Comment: (NOTE) Fact Sheet for Patients: BloggerCourse.com  Fact Sheet for Healthcare Providers: SeriousBroker.it  This test is not yet approved or cleared by the [place]  FDA and has been authorized for detection and/or diagnosis of SARS-CoV-2 by FDA under an Emergency Use Authorization (EUA). This EUA will remain in effect (meaning this test can be used) for the duration of the COVID-19 declaration under Section 564(b)(1) of the Act, 21 U.S.C. section 360bbb-3(b)(1), unless the authorization is terminated or revoked.  Performed at [facility], [address], [place], [place] [address]   Urine Culture     Status: None   Collection Time: 07/05/23  3:08 AM   Specimen: Urine, Random  Result Value Ref Range Status   Specimen Description   Final    URINE, RANDOM Performed at [facility], [address], [place], [place] [address]    Special Requests   Final    NONE Reflexed from [id number] Performed at [facility], [address], [place], [place] [address]    Culture   Final    NO GROWTH Performed at [facility], [address], [place], [place] [address]    Report Status 07/06/2023 FINAL  Final  Aerobic/Anaerobic Culture w Gram Stain (surgical/deep wound)     Status: None (Preliminary result)   Collection Time: 07/05/23  4:26 PM   Specimen: Abscess  Result Value Ref Range Status   Specimen Description   Final    ABSCESS Performed at [facility], [address], [place], [place] [address]    Special Requests   Final     GALLBLADDER Performed at [facility], [address] [place], [place] [address]    Gram Stain   Final    MODERATE WBC PRESENT,BOTH PMN AND MONONUCLEAR FEW GRAM NEGATIVE RODS Performed at [facility], [address] [place], [place] [address]    Culture   Final    ABUNDANT ESCHERICHIA COLI NO ANAEROBES ISOLATED; CULTURE IN PROGRESS FOR 5 DAYS    Report Status PENDING  Incomplete   Organism ID, Bacteria ESCHERICHIA COLI  Final      Susceptibility   Escherichia coli - MIC*    AMPICILLIN  >=32 RESISTANT Resistant     CEFEPIME  <=0.12 SENSITIVE Sensitive     CEFTAZIDIME <=1 SENSITIVE Sensitive     CEFTRIAXONE  <=0.25 SENSITIVE Sensitive     CIPROFLOXACIN  >=4 RESISTANT Resistant     GENTAMICIN <=1 SENSITIVE Sensitive     IMIPENEM <=0.25 SENSITIVE Sensitive     TRIMETH/SULFA >=320 RESISTANT Resistant     AMPICILLIN /SULBACTAM 16 INTERMEDIATE Intermediate     PIP/TAZO <=4 SENSITIVE Sensitive ug/mL    * ABUNDANT ESCHERICHIA COLI     Radiology Studies: DG Abd Portable 1V Result Date: 07/06/2023 CLINICAL DATA:  Abdominal distension EXAM: PORTABLE ABDOMEN - 1 VIEW COMPARISON:  CT abdomen and pelvis dated 07/05/2023 FINDINGS: Percutaneous  cholecystostomy drain in-situ. Mild gas-filled dilation of right lower quadrant colon. Mild gas-filled dilation of the stomach. Remainder bowel gas appears nonobstructive. No free air or pneumatosis. No abnormal  radio-opaque calculi or mass effect. No acute or substantial osseous abnormality. The sacrum and coccyx are partially obscured by overlying bowel contents. IMPRESSION: 1. Mild gas-filled dilation of the stomach and right lower quadrant colon, which may represent ileus. Remainder bowel gas appears nonobstructive. 2. Percutaneous cholecystostomy drain in-situ. Electronically Signed   By: Limin  Xu M.D.   On: 07/06/2023 15:18    Scheduled Meds:  aspirin  EC  81 mg Oral Daily   atorvastatin   20 mg Oral Daily   enoxaparin  (LOVENOX ) injection  50 mg Subcutaneous Q24H   escitalopram   5 mg Oral Daily   galantamine   8 mg Oral BID WC   insulin  aspart  0-20 Units Subcutaneous TID WC   lactulose   10 g Oral BID   memantine   10 mg Oral BID   ondansetron  (ZOFRAN ) IV  4 mg Intravenous Once   sodium chloride  flush  5 mL Intracatheter Q8H   Continuous Infusions:  piperacillin -tazobactam (ZOSYN )  IV 3.375 g (07/08/23 0907)     LOS: 3 days  MDM: Patient is high risk for one or more organ failure.  They necessitate ongoing hospitalization for continued IV therapies and subsequent lab monitoring. Total time spent interpreting labs and vitals, reviewing the medical record, coordinating care amongst consultants and care team members, directly assessing and discussing care with the patient and/or family: 55 min  Porsha Skilton, DO [facility]  To contact the attending physician between 7A-7P please use Epic Chat. To contact the covering physician during after hours 7P-7A, please review Amion.  07/08/2023, 9:45 AM   *This document has been created with the assistance of dictation software. Please excuse typographical errors. *

## 2023-07-08 NOTE — Progress Notes (Signed)
 07/08/2023  Subjective: No acute events overnight.  This morning the patient appears to be more awake and alert and able to follow with his head and eyes around the room.  White blood cell count slowly improving down to 16.2 today.  He was able to tolerate a full liquid diet and his wife reports that he has been doing well without apparent pain issues.  Vital signs: Temp:  [98 F (36.7 C)-100 F (37.8 C)] 98.9 F (37.2 C) (04/20 1200) Pulse Rate:  [68-109] 68 (04/20 1200) Resp:  [18-22] 22 (04/20 1200) BP: (122-150)/(66-87) 122/80 (04/20 1200) SpO2:  [91 %-95 %] 93 % (04/20 1200)   Intake/Output: 04/19 0701 - 04/20 0700 In: 1070 [P.O.:1060] Out: 660 [Urine:600; Drains:60] Last BM Date :  (PTA)  Physical Exam: Constitutional: No acute distress, more awake and alert Abdomen: Soft, nondistended, does not appear to be tender to palpation.  Right upper quadrant percutaneous drain with serosanguineous fluid in the bag.  Labs:  Recent Labs    07/07/23 0523 07/08/23 0533  WBC 17.5* 16.2*  HGB 10.0* 10.4*  HCT 30.2* 31.6*  PLT 343 376   Recent Labs    07/07/23 0523 07/08/23 0533  NA 138 136  K 3.4* 3.5  CL 100 104  CO2 24 26  GLUCOSE 152* 156*  BUN 16 13  CREATININE 0.87 0.90  CALCIUM  8.4* 8.1*   No results for input(s): "LABPROT", "INR" in the last 72 hours.  Imaging: No results found.  Assessment/Plan: This is a 78 y.o. male with acute cholecystitis status post percutaneous cholecystostomy drain placement.  - The patient clinically continues to improve and today he appears to be more awake and alert.  Does not appear to be having any issues from the pain standpoint or tolerating a full liquid diet yesterday. - Will advance the patient diet to a soft diet today. - Continue IV antibiotics for treatment of his cholecystitis.  Culture showing E. coli.  Discussed with Dr.Dezii about outpatient coverage and would be able to switch to a oral third-generation cephalosporin  when he is ready for discharge home. - Continue monitoring his white blood cell count.  If still elevated today but clinically he is improving.  If there is any worsening, may be worthwhile repeating imaging to further evaluate.   I spent 35 minutes dedicated to the care of this patient on the date of this encounter to include pre-visit review of records, face-to-face time with the patient discussing diagnosis and management, and any post-visit coordination of care.  Marene Shape, MD [facility]

## 2023-07-09 DIAGNOSIS — R6520 Severe sepsis without septic shock: Secondary | ICD-10-CM | POA: Diagnosis not present

## 2023-07-09 DIAGNOSIS — A419 Sepsis, unspecified organism: Secondary | ICD-10-CM | POA: Diagnosis not present

## 2023-07-09 LAB — CBC WITH DIFFERENTIAL/PLATELET
Abs Immature Granulocytes: 1.12 10*3/uL — ABNORMAL HIGH (ref 0.00–0.07)
Basophils Absolute: 0.1 10*3/uL (ref 0.0–0.1)
Basophils Relative: 1 %
Eosinophils Absolute: 0.9 10*3/uL — ABNORMAL HIGH (ref 0.0–0.5)
Eosinophils Relative: 6 %
HCT: 31.9 % — ABNORMAL LOW (ref 39.0–52.0)
Hemoglobin: 10.5 g/dL — ABNORMAL LOW (ref 13.0–17.0)
Immature Granulocytes: 7 %
Lymphocytes Relative: 12 %
Lymphs Abs: 1.8 10*3/uL (ref 0.7–4.0)
MCH: 29 pg (ref 26.0–34.0)
MCHC: 32.9 g/dL (ref 30.0–36.0)
MCV: 88.1 fL (ref 80.0–100.0)
Monocytes Absolute: 1.1 10*3/uL — ABNORMAL HIGH (ref 0.1–1.0)
Monocytes Relative: 7 %
Neutro Abs: 10.5 10*3/uL — ABNORMAL HIGH (ref 1.7–7.7)
Neutrophils Relative %: 67 %
Platelets: 419 10*3/uL — ABNORMAL HIGH (ref 150–400)
RBC: 3.62 MIL/uL — ABNORMAL LOW (ref 4.22–5.81)
RDW: 13.8 % (ref 11.5–15.5)
Smear Review: NORMAL
WBC Morphology: INCREASED
WBC: 15.5 10*3/uL — ABNORMAL HIGH (ref 4.0–10.5)
nRBC: 0 % (ref 0.0–0.2)

## 2023-07-09 LAB — COMPREHENSIVE METABOLIC PANEL WITH GFR
ALT: 39 U/L (ref 0–44)
AST: 71 U/L — ABNORMAL HIGH (ref 15–41)
Albumin: 2.4 g/dL — ABNORMAL LOW (ref 3.5–5.0)
Alkaline Phosphatase: 82 U/L (ref 38–126)
Anion gap: 9 (ref 5–15)
BUN: 14 mg/dL (ref 8–23)
CO2: 25 mmol/L (ref 22–32)
Calcium: 7.9 mg/dL — ABNORMAL LOW (ref 8.9–10.3)
Chloride: 101 mmol/L (ref 98–111)
Creatinine, Ser: 0.91 mg/dL (ref 0.61–1.24)
GFR, Estimated: >60 mL/min (ref >60)
Glucose, Bld: 166 mg/dL — ABNORMAL HIGH (ref 70–99)
Potassium: 3.3 mmol/L — ABNORMAL LOW (ref 3.5–5.1)
Sodium: 135 mmol/L (ref 135–145)
Total Bilirubin: 0.5 mg/dL (ref 0.0–1.2)
Total Protein: 6.7 g/dL (ref 6.5–8.1)

## 2023-07-09 LAB — GLUCOSE, CAPILLARY
Glucose-Capillary: 174 mg/dL — ABNORMAL HIGH (ref 70–99)
Glucose-Capillary: 167 mg/dL — ABNORMAL HIGH (ref 70–99)
Glucose-Capillary: 194 mg/dL — ABNORMAL HIGH (ref 70–99)
Glucose-Capillary: 219 mg/dL — ABNORMAL HIGH (ref 70–99)
Glucose-Capillary: 264 mg/dL — ABNORMAL HIGH (ref 70–99)

## 2023-07-09 LAB — PHOSPHORUS: Phosphorus: 3.6 mg/dL (ref 2.5–4.6)

## 2023-07-09 LAB — MAGNESIUM: Magnesium: 1.9 mg/dL (ref 1.7–2.4)

## 2023-07-09 MED ORDER — MILK AND MOLASSES ENEMA
1.0000 | Freq: Once | RECTAL | Status: AC
  Administered 2023-07-09: 240 mL via RECTAL
  Filled 2023-07-09 (×2): qty 240

## 2023-07-09 MED ORDER — TAMSULOSIN HCL 0.4 MG PO CAPS
0.8000 mg | ORAL_CAPSULE | Freq: Every day | ORAL | Status: DC
  Administered 2023-07-10 – 2023-07-12 (×3): 0.8 mg via ORAL
  Filled 2023-07-09 (×3): qty 2

## 2023-07-09 MED ORDER — TAMSULOSIN HCL 0.4 MG PO CAPS
0.4000 mg | ORAL_CAPSULE | Freq: Once | ORAL | Status: AC
  Administered 2023-07-09: 0.4 mg via ORAL
  Filled 2023-07-09: qty 1

## 2023-07-09 MED ORDER — POTASSIUM CHLORIDE CRYS ER 20 MEQ PO TBCR
40.0000 meq | EXTENDED_RELEASE_TABLET | Freq: Once | ORAL | Status: AC
  Administered 2023-07-09: 40 meq via ORAL
  Filled 2023-07-09: qty 2

## 2023-07-09 MED ORDER — LACTULOSE 10 GM/15ML PO SOLN
20.0000 g | Freq: Three times a day (TID) | ORAL | Status: DC
  Administered 2023-07-11 – 2023-07-12 (×4): 20 g via ORAL
  Filled 2023-07-09 (×7): qty 30

## 2023-07-09 MED ORDER — TAMSULOSIN HCL 0.4 MG PO CAPS
0.8000 mg | ORAL_CAPSULE | Freq: Every day | ORAL | Status: DC
Start: 2023-07-09 — End: 2023-07-09

## 2023-07-09 NOTE — Progress Notes (Signed)
 PROGRESS NOTE    Jeff Key  ZOX:[id number] DOB: 1945-05-08 DOA: 07/05/2023 PCP: Antonio Baumgarten, MD  Chief Complaint  Patient presents with   Emesis    Hospital Course:  Jeff Key is 78 y.o. male with advanced dementia, nonverbal at baseline, CAD, ischemic cardiomyopathy, chronic heart failure reduced EF, chronic ulcerative proctosigmoiditis, insulin -dependent diabetes, obesity, who was brought in by family member due to worsening GI symptoms with nausea, vomiting, fever, abdominal pain.  Workup in ED revealed stable vital signs, increased lactic acidosis of 3.5, creatinine 1.7, leukocytosis 27, CT abdomen pelvis with signs of acute cholecystitis.  Patient was started on broad-spectrum antibiotics and given a 2 L IV bolus.  General surgery was consulted and recommended cholecystotomy tube given patient has been on antiplatelet therapy  Subjective: No acute events overnight.  Did have some difficulties with urinary retention.  Acquired straight cath x 1. his wife reports that he typically soaks a diaper once every 8 hrs. His wife does feel concerned about him discharging home given his lack of bowel movement.  Objective: Vitals:   07/09/23 0400 07/09/23 0548 07/09/23 0802 07/09/23 1251  BP: (!) 163/79 (!) 149/84 (!) 154/86 (!) 159/81  Pulse: 73 66 71 78  Resp: 20 20 18 18   Temp: 99 F (37.2 C) 98.8 F (37.1 C) 98.7 F (37.1 C) 98.2 F (36.8 C)  TempSrc:   Oral   SpO2: 95% 94% 96% 95%  Weight:      Height:        Intake/Output Summary (Last 24 hours) at 07/09/2023 1550 Last data filed at 07/09/2023 1300 Gross per 24 hour  Intake 709.09 ml  Output 755 ml  Net -45.91 ml   Filed Weights   07/05/23 0124 07/05/23 1452  Weight: 108.9 kg 108.9 kg    Examination: General exam: Appears calm and comfortable, NAD  Respiratory system: No work of breathing, symmetric chest wall expansion Cardiovascular system: S1 & S2 heard, RRR.  Gastrointestinal system: Abdomen is  distended but soft, patient grimaces to palpation near drain, drain in place with serosanguineous fluid,+ bowel sounds Neuro: Alert, nonverbal, does not participate in exam or follow commands Skin: No rashes, lesions Psychiatry: calm, cannot assess otherwise  Assessment & Plan:  Principal Problem:   Severe sepsis (HCC) Active Problems:   Sepsis (HCC)   Acute cholecystitis    Sepsis with acute endorgan damage Acute cholecystitis - Sepsis criteria met with: Leukocytosis, lactic acidosis, AKI endorgan damage, infection: Acute cholecystitis - No further fever. - She is tolerating a diet - Continue with pain control, patient does appear to have some tenderness on palpation today as well as difficulty with bowel movements - Increase lactulose , milk and molasses enema ordered - Will continue with Zosyn  for now, cultures are growing E. coli with some resistance though sensitivity to ceftriaxone .  Will likely discharge with cefpodoxime. - Continue to monitor closely, WBC is downtrending but has not yet returned to normal. - General Surgery has cleared patient for discharge with outpatient follow-up in the clinic. - continue serial abdominal exams, low threshold for reimaging  Constipation - No bowel movements since arrival. - Was initiated on lactulose , have increased dose.  Added enema today - Continue to monitor for improvement.  Suspect this is contributing to his pain.  AKI - Secondary to sepsis -- Trend CMP, resolved now.   Urinary retention - Patient may have high threshold, given his wife reports large volume voiding every 8 hours at home - Repeat bladder scan  pending.  May require Foley catheter insertion - Was initiated on Flomax , have increased dose  Hypokalemia - Replace as needed  History of CAD Ischemic cardiomyopathy Chronic heart failure reduced EF - Clinically euvolemic at this time - Echo: LVEF 55 to 60%, grade 1 diastolic dysfunction.  Dilation of aortic  root, - Status post 3 L IV fluids in the ED. - GDMT as BP tolerates - Troponin slightly elevated, patient does not appear to have chest pain.  Echo as above  Hypertension - Resume as BP tolerates  Insulin -dependent diabetes - Sliding scale insulin  with basal/bolus titration as needed-hemoglobin A1c 7.2%  Obesity BMI 34 - Complicates the above care plan given necessity of surgical intervention - Outpatient follow up for lifestyle modification and risk factor management  Advanced dementia - Family reports current mentation is at baseline.  Patient is nonverbal. - Continue memantadine  DVT prophylaxis: SCDs   Code Status: Full Code Disposition: Inpatient.  Hopefully discharging home tomorrow.  Currently on IV antibiotics.  Pending resolution of constipation and urinary retention  Consultants:  Treatment Team:  Consulting Physician: Conrado Delay, DO  Procedures:  Cystostomy tube 4/16  Antimicrobials:  Anti-infectives (From admission, onward)    Start     Dose/Rate Route Frequency Ordered Stop   07/05/23 1130  cefOXitin  (MEFOXIN ) 2 g in sodium chloride  0.9 % 100 mL IVPB        2 g 200 mL/hr over 30 Minutes Intravenous To Radiology 07/05/23 1118 07/05/23 1659   07/05/23 0900  piperacillin -tazobactam (ZOSYN ) IVPB 3.375 g        3.375 g 12.5 mL/hr over 240 Minutes Intravenous Every 8 hours 07/05/23 0805     07/05/23 0230  ceFEPIme  (MAXIPIME ) 2 g in sodium chloride  0.9 % 100 mL IVPB        2 g 200 mL/hr over 30 Minutes Intravenous  Once 07/05/23 0219 07/05/23 0303   07/05/23 0230  metroNIDAZOLE  (FLAGYL ) IVPB 500 mg        500 mg 100 mL/hr over 60 Minutes Intravenous  Once 07/05/23 0219 07/05/23 0347   07/05/23 0230  vancomycin  (VANCOREADY) IVPB 2000 mg/400 mL        2,000 mg 200 mL/hr over 120 Minutes Intravenous  Once 07/05/23 0227 07/05/23 0616       Data Reviewed: I have personally reviewed following labs and imaging studies CBC: Recent Labs  Lab 07/05/23 0145  07/06/23 0505 07/07/23 0523 07/08/23 0533 07/09/23 0509  WBC 27.1* 17.5* 17.5* 16.2* 15.5*  NEUTROABS 22.5*  --  13.3* 11.4* 10.5*  HGB 11.9* 10.3* 10.0* 10.4* 10.5*  HCT 36.7* 30.9* 30.2* 31.6* 31.9*  MCV 89.3 86.8 87.0 89.5 88.1  PLT 346 326 343 376 419*   Basic Metabolic Panel: Recent Labs  Lab 07/05/23 0145 07/06/23 0505 07/07/23 0523 07/08/23 0533 07/09/23 0509  NA 132* 135 138 136 135  K 3.9 3.3* 3.4* 3.5 3.3*  CL 96* 103 100 104 101  CO2 22 23 24 26 25   GLUCOSE 215* 147* 152* 156* 166*  BUN 20 17 16 13 14   CREATININE 1.78* 1.02 0.87 0.90 0.91  CALCIUM  8.4* 8.1* 8.4* 8.1* 7.9*  MG  --   --  2.1 1.8 1.9  PHOS  --   --  3.5 3.6 3.6   GFR: Estimated Creatinine Clearance: 82.7 mL/min (by C-G formula based on SCr of 0.91 mg/dL). Liver Function Tests: Recent Labs  Lab 07/05/23 0145 07/06/23 0505 07/07/23 0523 07/08/23 0533 07/09/23 0509  AST 23 19  33 66* 71*  ALT 15 14 18  32 39  ALKPHOS 72 57 61 77 82  BILITOT 1.1 0.9 0.9 0.8 0.5  PROT 7.3 6.5 6.3* 6.6 6.7  ALBUMIN 2.9* 2.5* 2.3* 2.3* 2.4*   CBG: Recent Labs  Lab 07/08/23 1708 07/08/23 2058 07/09/23 0804 07/09/23 1245 07/09/23 1346  GLUCAP 127* 196* 174* 167* 194*    Recent Results (from the past 240 hours)  Blood Culture (routine x 2)     Status: None (Preliminary result)   Collection Time: 07/05/23  1:25 AM   Specimen: BLOOD RIGHT ARM  Result Value Ref Range Status   Specimen Description   Final    BLOOD RIGHT ARM Performed at [facility], [address] [place], [place] [address]    Special Requests   Final    BOTTLES DRAWN AEROBIC AND ANAEROBIC Blood Culture adequate volume   Culture   Final    NO GROWTH 4 DAYS Performed at [facility], [address] [place], [place] [address]    Report Status PENDING  Incomplete  Blood Culture (routine x 2)     Status: None (Preliminary result)   Collection Time: 07/05/23  1:25 AM   Specimen: BLOOD  Result Value Ref Range Status    Specimen Description BLOOD LAC  Final   Special Requests   Final    BOTTLES DRAWN AEROBIC AND ANAEROBIC Blood Culture adequate volume   Culture   Final    NO GROWTH 4 DAYS Performed at [facility], [address] [place], [place] [address]    Report Status PENDING  Incomplete  Resp panel by RT-PCR (RSV, Flu A&B, Covid) Anterior Nasal Swab     Status: None   Collection Time: 07/05/23  1:48 AM   Specimen: Anterior Nasal Swab  Result Value Ref Range Status   SARS Coronavirus 2 by RT PCR NEGATIVE NEGATIVE Final    Comment: (NOTE) SARS-CoV-2 target nucleic acids are NOT DETECTED.  The SARS-CoV-2 RNA is generally detectable in upper respiratory specimens during the acute phase of infection. The lowest concentration of SARS-CoV-2 viral copies this assay can detect is 138 copies/mL. A negative result does not preclude SARS-Cov-2 infection and should not be used as the sole basis for treatment or other patient management decisions. A negative result may occur with  improper specimen collection/handling, submission of specimen other than nasopharyngeal swab, presence of viral mutation(s) within the areas targeted by this assay, and inadequate number of viral copies(<138 copies/mL). A negative result must be combined with clinical observations, patient history, and epidemiological information. The expected result is Negative.  Fact Sheet for Patients:  BloggerCourse.com  Fact Sheet for Healthcare Providers:  SeriousBroker.it  This test is no t yet approved or cleared by the [place]  FDA and  has been authorized for detection and/or diagnosis of SARS-CoV-2 by FDA under an Emergency Use Authorization (EUA). This EUA will remain  in effect (meaning this test can be used) for the duration of the COVID-19 declaration under Section 564(b)(1) of the Act, 21 U.S.C.section 360bbb-3(b)(1), unless the authorization is terminated   or revoked sooner.       Influenza A by PCR NEGATIVE NEGATIVE Final   Influenza B by PCR NEGATIVE NEGATIVE Final    Comment: (NOTE) The Xpert Xpress SARS-CoV-2/FLU/RSV plus assay is intended as an aid in the diagnosis of influenza from Nasopharyngeal swab specimens and should not be used as a sole basis for treatment. Nasal washings and aspirates are unacceptable  for Xpert Xpress SARS-CoV-2/FLU/RSV testing.  Fact Sheet for Patients: BloggerCourse.com  Fact Sheet for Healthcare Providers: SeriousBroker.it  This test is not yet approved or cleared by the [place]  FDA and has been authorized for detection and/or diagnosis of SARS-CoV-2 by FDA under an Emergency Use Authorization (EUA). This EUA will remain in effect (meaning this test can be used) for the duration of the COVID-19 declaration under Section 564(b)(1) of the Act, 21 U.S.C. section 360bbb-3(b)(1), unless the authorization is terminated or revoked.     Resp Syncytial Virus by PCR NEGATIVE NEGATIVE Final    Comment: (NOTE) Fact Sheet for Patients: BloggerCourse.com  Fact Sheet for Healthcare Providers: SeriousBroker.it  This test is not yet approved or cleared by the [place]  FDA and has been authorized for detection and/or diagnosis of SARS-CoV-2 by FDA under an Emergency Use Authorization (EUA). This EUA will remain in effect (meaning this test can be used) for the duration of the COVID-19 declaration under Section 564(b)(1) of the Act, 21 U.S.C. section 360bbb-3(b)(1), unless the authorization is terminated or revoked.  Performed at [facility], [address], [place], [place] [address]   Urine Culture     Status: None   Collection Time: 07/05/23  3:08 AM   Specimen: Urine, Random  Result Value Ref Range Status   Specimen Description   Final    URINE, RANDOM Performed at [facility], [address] [place], [place] [address]    Special Requests   Final    NONE Reflexed from [id number] Performed at [facility], [address], [place], [place] [address]    Culture   Final    NO GROWTH Performed at [facility], [address], [place], [place] [address]    Report Status 07/06/2023 FINAL  Final  Aerobic/Anaerobic Culture w Gram Stain (surgical/deep wound)     Status: None (Preliminary result)   Collection Time: 07/05/23  4:26 PM   Specimen: Abscess  Result Value Ref Range Status   Specimen Description   Final    ABSCESS Performed at [facility], [address] [place], [place] [address]    Special Requests   Final     GALLBLADDER Performed at [facility], [address] [place], [place] [address]    Gram Stain   Final    MODERATE WBC PRESENT,BOTH PMN AND MONONUCLEAR FEW GRAM NEGATIVE RODS Performed at [facility], [address], [place], [place] [address]    Culture   Final    ABUNDANT ESCHERICHIA COLI NO ANAEROBES ISOLATED; CULTURE IN PROGRESS FOR 5 DAYS    Report Status PENDING  Incomplete   Organism ID, Bacteria ESCHERICHIA COLI  Final      Susceptibility   Escherichia coli - MIC*    AMPICILLIN  >=32 RESISTANT Resistant     CEFEPIME  <=0.12 SENSITIVE Sensitive     CEFTAZIDIME <=1 SENSITIVE Sensitive     CEFTRIAXONE  <=0.25 SENSITIVE Sensitive     CIPROFLOXACIN  >=4 RESISTANT Resistant     GENTAMICIN <=1 SENSITIVE Sensitive     IMIPENEM <=0.25 SENSITIVE Sensitive     TRIMETH/SULFA >=320 RESISTANT Resistant     AMPICILLIN /SULBACTAM 16 INTERMEDIATE Intermediate     PIP/TAZO <=4 SENSITIVE Sensitive ug/mL    * ABUNDANT ESCHERICHIA COLI     Radiology Studies: No results found.   Scheduled Meds:  aspirin  EC  81 mg Oral Daily   atorvastatin   20 mg Oral Daily   enoxaparin  (LOVENOX ) injection  50 mg Subcutaneous Q24H   escitalopram   5 mg Oral Daily   feeding supplement  237 mL Oral BID BM    galantamine   8 mg Oral BID WC   insulin  aspart  0-20 Units Subcutaneous TID WC   lactulose   20 g Oral TID   memantine   10 mg Oral BID   ondansetron  (ZOFRAN ) IV  4 mg Intravenous Once   sodium chloride  flush  5 mL Intracatheter Q8H   tamsulosin   0.4 mg Oral Daily   Continuous Infusions:  piperacillin -tazobactam (ZOSYN )  IV Stopped (07/09/23 1233)     LOS: 4 days  MDM: Patient is high risk for one or more organ failure.  They necessitate ongoing hospitalization for continued IV therapies and subsequent lab monitoring. Total time spent interpreting labs and vitals, reviewing the medical record, coordinating care amongst consultants and care team members, directly assessing and discussing care with the patient and/or family: 55 min  Erick Oxendine, DO [facility]  To contact the attending physician between 7A-7P please use Epic Chat. To contact the covering physician during after hours 7P-7A, please review Amion.  07/09/2023, 3:50 PM   *This document has been created with the assistance of dictation software. Please excuse typographical errors. *

## 2023-07-09 NOTE — Care Management Important Message (Signed)
 Important Message  Patient Details  Name: Jeff Key MRN: [id number] Date of Birth: July 04, 1945   Important Message Given:  Yes - Medicare IM     Anise Kerns 07/09/2023, 1:08 PM

## 2023-07-09 NOTE — Progress Notes (Signed)
 SLP Cancellation Note  Patient Details Name: Jeff Key MRN: [id number] DOB: Dec 05, 1945   Cancelled treatment:       Reason Eval/Treat Not Completed:  (chart reviewed; consulted NSG then met w/ Wife in room.)  Per Wife and NSG reports, pt's diet was upgrade to GI soft diet by Surgery (yesterday), and pt has been tolerating this po diet well. Wife stated he is eating "most" of his meals w/ no overt coughing or discomfort noted. She stated she feeds him and he eats "like we do at home". Wife stated he seemed at his swallowing Baseline. Recommend general aspiration precautions as she follows at home. Wife agreed. No further skilled ST services indicated at this time. NSG/MD to reconsult if any new needs arise during admit. Wife and NSG agreed.     Darla Edward, MS, CCC-SLP Speech Language Pathologist Rehab Services; [facility] [phone] (ascom) Anona Giovannini 07/09/2023, 5:40 PM

## 2023-07-10 ENCOUNTER — Inpatient Hospital Stay

## 2023-07-10 DIAGNOSIS — R6520 Severe sepsis without septic shock: Secondary | ICD-10-CM | POA: Diagnosis not present

## 2023-07-10 DIAGNOSIS — A419 Sepsis, unspecified organism: Secondary | ICD-10-CM | POA: Diagnosis not present

## 2023-07-10 DIAGNOSIS — K810 Acute cholecystitis: Secondary | ICD-10-CM | POA: Diagnosis not present

## 2023-07-10 LAB — CULTURE, BLOOD (ROUTINE X 2)
Culture: NO GROWTH
Special Requests: ADEQUATE
Special Requests: ADEQUATE

## 2023-07-10 LAB — COMPREHENSIVE METABOLIC PANEL WITH GFR
ALT: 30 U/L (ref 0–44)
AST: 35 U/L (ref 15–41)
Albumin: 2.6 g/dL — ABNORMAL LOW (ref 3.5–5.0)
Alkaline Phosphatase: 86 U/L (ref 38–126)
Anion gap: 11 (ref 5–15)
BUN: 15 mg/dL (ref 8–23)
CO2: 24 mmol/L (ref 22–32)
Calcium: 8.3 mg/dL — ABNORMAL LOW (ref 8.9–10.3)
Chloride: 103 mmol/L (ref 98–111)
Creatinine, Ser: 0.85 mg/dL (ref 0.61–1.24)
GFR, Estimated: >60 mL/min (ref >60)
Glucose, Bld: 166 mg/dL — ABNORMAL HIGH (ref 70–99)
Potassium: 3.5 mmol/L (ref 3.5–5.1)
Sodium: 138 mmol/L (ref 135–145)
Total Bilirubin: 0.6 mg/dL (ref 0.0–1.2)
Total Protein: 7 g/dL (ref 6.5–8.1)

## 2023-07-10 LAB — PHOSPHORUS: Phosphorus: 3.1 mg/dL (ref 2.5–4.6)

## 2023-07-10 LAB — CBC WITH DIFFERENTIAL/PLATELET
Abs Immature Granulocytes: 0.97 10*3/uL — ABNORMAL HIGH (ref 0.00–0.07)
Basophils Absolute: 0.1 10*3/uL (ref 0.0–0.1)
Basophils Relative: 1 %
Eosinophils Absolute: 0.9 10*3/uL — ABNORMAL HIGH (ref 0.0–0.5)
Eosinophils Relative: 5 %
HCT: 32 % — ABNORMAL LOW (ref 39.0–52.0)
Hemoglobin: 10.5 g/dL — ABNORMAL LOW (ref 13.0–17.0)
Immature Granulocytes: 6 %
Lymphocytes Relative: 12 %
Lymphs Abs: 2 10*3/uL (ref 0.7–4.0)
MCH: 29 pg (ref 26.0–34.0)
MCHC: 32.8 g/dL (ref 30.0–36.0)
MCV: 88.4 fL (ref 80.0–100.0)
Monocytes Absolute: 1 10*3/uL (ref 0.1–1.0)
Monocytes Relative: 6 %
Neutro Abs: 12 10*3/uL — ABNORMAL HIGH (ref 1.7–7.7)
Neutrophils Relative %: 70 %
Platelets: 435 10*3/uL — ABNORMAL HIGH (ref 150–400)
RBC: 3.62 MIL/uL — ABNORMAL LOW (ref 4.22–5.81)
RDW: 13.8 % (ref 11.5–15.5)
Smear Review: NORMAL
WBC: 17 10*3/uL — ABNORMAL HIGH (ref 4.0–10.5)
nRBC: 0 % (ref 0.0–0.2)

## 2023-07-10 LAB — GLUCOSE, CAPILLARY
Glucose-Capillary: 178 mg/dL — ABNORMAL HIGH (ref 70–99)
Glucose-Capillary: 170 mg/dL — ABNORMAL HIGH (ref 70–99)
Glucose-Capillary: 159 mg/dL — ABNORMAL HIGH (ref 70–99)
Glucose-Capillary: 162 mg/dL — ABNORMAL HIGH (ref 70–99)

## 2023-07-10 LAB — MAGNESIUM: Magnesium: 1.9 mg/dL (ref 1.7–2.4)

## 2023-07-10 MED ORDER — CHLORHEXIDINE GLUCONATE CLOTH 2 % EX PADS
6.0000 | MEDICATED_PAD | Freq: Every day | CUTANEOUS | Status: DC
  Administered 2023-07-11: 6 via TOPICAL

## 2023-07-10 MED ORDER — IOHEXOL 9 MG/ML PO SOLN
500.0000 mL | ORAL | Status: AC
Start: 2023-07-10 — End: 2023-07-10
  Administered 2023-07-10 (×2): 500 mL via ORAL

## 2023-07-10 MED ORDER — IOHEXOL 300 MG/ML  SOLN
100.0000 mL | Freq: Once | INTRAMUSCULAR | Status: AC | PRN
  Administered 2023-07-10: 100 mL via INTRAVENOUS

## 2023-07-10 NOTE — Progress Notes (Addendum)
 PROGRESS NOTE    Jeff Key  FIE:[id number] DOB: September 16, 1945 DOA: 07/05/2023 PCP: Antonio Baumgarten, MD  Chief Complaint  Patient presents with   Emesis    Hospital Course:  Jeff Key is 78 y.o. male with advanced dementia, nonverbal at baseline, CAD, ischemic cardiomyopathy, chronic heart failure reduced EF, chronic ulcerative proctosigmoiditis, insulin -dependent diabetes, obesity, who was brought in by family member due to worsening GI symptoms with nausea, vomiting, fever, abdominal pain.  Workup in ED revealed stable vital signs, increased lactic acidosis of 3.5, creatinine 1.7, leukocytosis 27, CT abdomen pelvis with signs of acute cholecystitis.  Patient was started on broad-spectrum antibiotics and given a 2 L IV bolus.  General surgery was consulted and recommended cholecystotomy tube given patient has been on antiplatelet therapy.  Stay has been been prolonged due to persistent leukocytosis, urinary retention, and constipation.  Subjective: Continues to struggle with urinary retention.  Foley catheter placed yesterday evening.  Patient appears bothered by this and is trying to pull it out.  Have discussed with his wife, who is at bedside for frequent reorientation and redirection. Patient has had 1 small bowel movement, but none consistently. WBC continues to uptrend  Objective: Vitals:   07/10/23 0329 07/10/23 0744 07/10/23 1306 07/10/23 1608  BP: 132/73 120/68 (!) 119/54 123/75  Pulse: 80 82 83 84  Resp: 18 18 16 18   Temp: 99.2 F (37.3 C) 98.3 F (36.8 C) 98.6 F (37 C)   TempSrc:      SpO2: (!) 89% 97% 94% 96%  Weight:      Height:        Intake/Output Summary (Last 24 hours) at 07/10/2023 1659 Last data filed at 07/10/2023 1500 Gross per 24 hour  Intake 114.94 ml  Output 1630 ml  Net -1515.06 ml   Filed Weights   07/05/23 0124 07/05/23 1452  Weight: 108.9 kg 108.9 kg    Examination: General exam: Appears calm and uncomfortable, tugging at  penis. Respiratory system: No work of breathing, symmetric chest wall expansion Cardiovascular system: S1 & S2 heard, RRR.  Gastrointestinal system: Abdomen is distended but soft, patient grimaces to palpation near drain, drain in place with serosanguineous fluid, scant pus.,+ bowel sounds Neuro: Alert, nonverbal, does not participate in exam or follow commands Skin: No rashes, lesions Psychiatry: calm, cannot assess otherwise  Assessment & Plan:  Principal Problem:   Severe sepsis (HCC) Active Problems:   Sepsis (HCC)   Acute cholecystitis    Sepsis with acute endorgan damage Acute cholecystitis - Sepsis criteria met with: Leukocytosis, lactic acidosis, AKI endorgan damage, infection: Acute cholecystitis - Status post percutaneous cholecystostomy tube. - Tube with minimal drainage.  On her output. - WBC has been uptrending.  Repeat CT scan today, discussed with general surgery - Constipation, aggressive bowel regimen as below. - Will continue with Zosyn  for now, cultures are growing E. coli with some resistance though sensitivity to ceftriaxone .  Will likely discharge with cefpodoxime. - Continue to trend CBC.  Constipation - 1 small bowel movement 4/21.  Continue aggressive bowel regimen. - May be playing a role in urinary retention and contributing to his pain.  AKI - Secondary to sepsis -- Trend CMP, resolved now.   Urinary retention - Patient may have high threshold, given his wife reports large volume voiding every 8 hours at home - Have initiated Flomax .  Foley catheter. - Patient's wife, his primary caregiver, has requested patient not discharged home with Foley catheter.  Will keep Foley in place  for now.  Consider trial of void when constipation is resolved and CT scan results have returned regarding concern for intra-abdominal infection  Hypokalemia - Replace as needed  History of CAD Ischemic cardiomyopathy Chronic heart failure reduced EF - Clinically  euvolemic at this time - Echo: LVEF 55 to 60%, grade 1 diastolic dysfunction.  Dilation of aortic root, - Status post 3 L IV fluids in the ED. - GDMT as BP tolerates - Troponin slightly elevated, patient does not appear to have chest pain.  Echo as above  Hypertension - Resume as BP tolerates  Insulin -dependent diabetes - Sliding scale insulin  with basal/bolus titration as needed-hemoglobin A1c 7.2%  Obesity BMI 34 - Complicates the above care plan given necessity of surgical intervention - Outpatient follow up for lifestyle modification and risk factor management  Advanced dementia - Family reports current mentation is at baseline.  Patient is nonverbal. - Continue memantadine  Addendum: CT with: 1. Peripherally enhancing fluid collections in the pelvis measuring 5.4 x 1.2 cm and 3.6 x 1.5 cm, suspicious for abscesses. 2. Percutaneous cholecystostomy tube in the decompressed gallbladder. Gallbladder wall thickening and hyperenhancement with trace adjacent stranding may be due to ongoing infection/inflammation or decompression. May need additional drainage with IR. Discuss with surgery. Keep Zosyn  for now.  DVT prophylaxis: SCDs   Code Status: Full Code Disposition: Inpatient.  Initially planned to discharge home today, however patient now has urinary retention, constipation, and rising WBC.  Repeat CT scan pending. Eventually home with his wife  Consultants:  Treatment Team:  Consulting Physician: Conrado Delay, DO  Procedures:  Cystostomy tube 4/16  Antimicrobials:  Anti-infectives (From admission, onward)    Start     Dose/Rate Route Frequency Ordered Stop   07/05/23 1130  cefOXitin  (MEFOXIN ) 2 g in sodium chloride  0.9 % 100 mL IVPB        2 g 200 mL/hr over 30 Minutes Intravenous To Radiology 07/05/23 1118 07/05/23 1659   07/05/23 0900  piperacillin -tazobactam (ZOSYN ) IVPB 3.375 g        3.375 g 12.5 mL/hr over 240 Minutes Intravenous Every 8 hours 07/05/23 0805      07/05/23 0230  ceFEPIme  (MAXIPIME ) 2 g in sodium chloride  0.9 % 100 mL IVPB        2 g 200 mL/hr over 30 Minutes Intravenous  Once 07/05/23 0219 07/05/23 0303   07/05/23 0230  metroNIDAZOLE  (FLAGYL ) IVPB 500 mg        500 mg 100 mL/hr over 60 Minutes Intravenous  Once 07/05/23 0219 07/05/23 0347   07/05/23 0230  vancomycin  (VANCOREADY) IVPB 2000 mg/400 mL        2,000 mg 200 mL/hr over 120 Minutes Intravenous  Once 07/05/23 0227 07/05/23 0616       Data Reviewed: I have personally reviewed following labs and imaging studies CBC: Recent Labs  Lab 07/05/23 0145 07/06/23 0505 07/07/23 0523 07/08/23 0533 07/09/23 0509 07/10/23 0525  WBC 27.1* 17.5* 17.5* 16.2* 15.5* 17.0*  NEUTROABS 22.5*  --  13.3* 11.4* 10.5* 12.0*  HGB 11.9* 10.3* 10.0* 10.4* 10.5* 10.5*  HCT 36.7* 30.9* 30.2* 31.6* 31.9* 32.0*  MCV 89.3 86.8 87.0 89.5 88.1 88.4  PLT 346 326 343 376 419* 435*   Basic Metabolic Panel: Recent Labs  Lab 07/06/23 0505 07/07/23 0523 07/08/23 0533 07/09/23 0509 07/10/23 0525  NA 135 138 136 135 138  K 3.3* 3.4* 3.5 3.3* 3.5  CL 103 100 104 101 103  CO2 23 24 26 25  24  GLUCOSE 147* 152* 156* 166* 166*  BUN 17 16 13 14 15   CREATININE 1.02 0.87 0.90 0.91 0.85  CALCIUM  8.1* 8.4* 8.1* 7.9* 8.3*  MG  --  2.1 1.8 1.9 1.9  PHOS  --  3.5 3.6 3.6 3.1   GFR: Estimated Creatinine Clearance: 88.5 mL/min (by C-G formula based on SCr of 0.85 mg/dL). Liver Function Tests: Recent Labs  Lab 07/06/23 0505 07/07/23 0523 07/08/23 0533 07/09/23 0509 07/10/23 0525  AST 19 33 66* 71* 35  ALT 14 18 32 39 30  ALKPHOS 57 61 77 82 86  BILITOT 0.9 0.9 0.8 0.5 0.6  PROT 6.5 6.3* 6.6 6.7 7.0  ALBUMIN 2.5* 2.3* 2.3* 2.4* 2.6*   CBG: Recent Labs  Lab 07/09/23 1616 07/09/23 2256 07/10/23 0745 07/10/23 1305 07/10/23 1609  GLUCAP 219* 264* 178* 170* 159*    Recent Results (from the past 240 hours)  Blood Culture (routine x 2)     Status: None   Collection Time: 07/05/23  1:25  AM   Specimen: BLOOD RIGHT ARM  Result Value Ref Range Status   Specimen Description   Final    BLOOD RIGHT ARM Performed at [facility], [address], [place], [place] [address]    Special Requests   Final    BOTTLES DRAWN AEROBIC AND ANAEROBIC Blood Culture adequate volume   Culture   Final    NO GROWTH 5 DAYS Performed at [facility], [address] [place] [place], [place] [address]    Report Status 07/10/2023 FINAL  Final  Blood Culture (routine x 2)     Status: None   Collection Time: 07/05/23  1:25 AM   Specimen: BLOOD  Result Value Ref Range Status   Specimen Description BLOOD LAC  Final   Special Requests   Final    BOTTLES DRAWN AEROBIC AND ANAEROBIC Blood Culture adequate volume   Culture   Final    NO GROWTH 5 DAYS Performed at [facility], [address] [place], [place] [address]    Report Status 07/10/2023 FINAL  Final  Resp panel by RT-PCR (RSV, Flu A&B, Covid) Anterior Nasal Swab     Status: None   Collection Time: 07/05/23  1:48 AM   Specimen: Anterior Nasal Swab  Result Value Ref Range Status   SARS Coronavirus 2 by RT PCR NEGATIVE NEGATIVE Final    Comment: (NOTE) SARS-CoV-2 target nucleic acids are NOT DETECTED.  The SARS-CoV-2 RNA is generally detectable in upper respiratory specimens during the acute phase of infection. The lowest concentration of SARS-CoV-2 viral copies this assay can detect is 138 copies/mL. A negative result does not preclude SARS-Cov-2 infection and should not be used as the sole basis for treatment or other patient management decisions. A negative result may occur with  improper specimen collection/handling, submission of specimen other than nasopharyngeal swab, presence of viral mutation(s) within the areas targeted by this assay, and inadequate number of viral copies(<138 copies/mL). A negative result must be combined with clinical observations, patient history, and epidemiological information. The  expected result is Negative.  Fact Sheet for Patients:  BloggerCourse.com  Fact Sheet for Healthcare Providers:  SeriousBroker.it  This test is no t yet approved or cleared by the [place]  FDA and  has been authorized for detection and/or diagnosis of SARS-CoV-2 by FDA under an Emergency Use Authorization (EUA). This EUA will remain  in effect (meaning this test can be used) for the duration of the COVID-19 declaration under  Section 564(b)(1) of the Act, 21 U.S.C.section 360bbb-3(b)(1), unless the authorization is terminated  or revoked sooner.       Influenza A by PCR NEGATIVE NEGATIVE Final   Influenza B by PCR NEGATIVE NEGATIVE Final    Comment: (NOTE) The Xpert Xpress SARS-CoV-2/FLU/RSV plus assay is intended as an aid in the diagnosis of influenza from Nasopharyngeal swab specimens and should not be used as a sole basis for treatment. Nasal washings and aspirates are unacceptable for Xpert Xpress SARS-CoV-2/FLU/RSV testing.  Fact Sheet for Patients: BloggerCourse.com  Fact Sheet for Healthcare Providers: SeriousBroker.it  This test is not yet approved or cleared by the [place]  FDA and has been authorized for detection and/or diagnosis of SARS-CoV-2 by FDA under an Emergency Use Authorization (EUA). This EUA will remain in effect (meaning this test can be used) for the duration of the COVID-19 declaration under Section 564(b)(1) of the Act, 21 U.S.C. section 360bbb-3(b)(1), unless the authorization is terminated or revoked.     Resp Syncytial Virus by PCR NEGATIVE NEGATIVE Final    Comment: (NOTE) Fact Sheet for Patients: BloggerCourse.com  Fact Sheet for Healthcare Providers: SeriousBroker.it  This test is not yet approved or cleared by the [place]  FDA and has been authorized for detection and/or  diagnosis of SARS-CoV-2 by FDA under an Emergency Use Authorization (EUA). This EUA will remain in effect (meaning this test can be used) for the duration of the COVID-19 declaration under Section 564(b)(1) of the Act, 21 U.S.C. section 360bbb-3(b)(1), unless the authorization is terminated or revoked.  Performed at [facility], [address] [place], [place] [address]   Urine Culture     Status: None   Collection Time: 07/05/23  3:08 AM   Specimen: Urine, Random  Result Value Ref Range Status   Specimen Description   Final    URINE, RANDOM Performed at [facility], [address], [place], [place] [address]    Special Requests   Final    NONE Reflexed from [phone] Performed at [facility], [address] [place], [place] [address]    Culture   Final    NO GROWTH Performed at [facility], [address] [place], [place] [address]    Report Status 07/06/2023 FINAL  Final  Aerobic/Anaerobic Culture w Gram Stain (surgical/deep wound)     Status: None   Collection Time: 07/05/23  4:26 PM   Specimen: Abscess  Result Value Ref Range Status   Specimen Description   Final    ABSCESS Performed at [facility], [address], [place], [place] [address]    Special Requests   Final     GALLBLADDER Performed at [facility], [address] [place], [place] [address]    Gram Stain   Final    MODERATE WBC PRESENT,BOTH PMN AND MONONUCLEAR FEW GRAM NEGATIVE RODS    Culture   Final    ABUNDANT ESCHERICHIA COLI NO ANAEROBES ISOLATED Performed at [facility], [address] [place], [place] [address]    Report Status 07/10/2023 FINAL  Final   Organism ID, Bacteria ESCHERICHIA COLI  Final      Susceptibility   Escherichia coli - MIC*    AMPICILLIN  >=32 RESISTANT Resistant     CEFEPIME  <=0.12 SENSITIVE Sensitive     CEFTAZIDIME <=1 SENSITIVE Sensitive     CEFTRIAXONE  <=0.25 SENSITIVE Sensitive     CIPROFLOXACIN   >=4 RESISTANT Resistant     GENTAMICIN <=1 SENSITIVE Sensitive  IMIPENEM <=0.25 SENSITIVE Sensitive     TRIMETH/SULFA >=320 RESISTANT Resistant     AMPICILLIN /SULBACTAM 16 INTERMEDIATE Intermediate     PIP/TAZO <=4 SENSITIVE Sensitive ug/mL    * ABUNDANT ESCHERICHIA COLI     Radiology Studies: No results found.   Scheduled Meds:  aspirin  EC  81 mg Oral Daily   atorvastatin   20 mg Oral Daily   enoxaparin  (LOVENOX ) injection  50 mg Subcutaneous Q24H   escitalopram   5 mg Oral Daily   feeding supplement  237 mL Oral BID BM   galantamine   8 mg Oral BID WC   insulin  aspart  0-20 Units Subcutaneous TID WC   lactulose   20 g Oral TID   memantine   10 mg Oral BID   ondansetron  (ZOFRAN ) IV  4 mg Intravenous Once   sodium chloride  flush  5 mL Intracatheter Q8H   tamsulosin   0.8 mg Oral Daily   Continuous Infusions:  piperacillin -tazobactam (ZOSYN )  IV 3.375 g (07/10/23 1107)     LOS: 5 days  MDM: Patient is high risk for one or more organ failure.  They necessitate ongoing hospitalization for continued IV therapies and subsequent lab monitoring. Total time spent interpreting labs and vitals, reviewing the medical record, coordinating care amongst consultants and care team members, directly assessing and discussing care with the patient and/or family: 55 min  Akeen Ledyard, DO [facility]  To contact the attending physician between 7A-7P please use Epic Chat. To contact the covering physician during after hours 7P-7A, please review Amion.  07/10/2023, 4:59 PM   *This document has been created with the assistance of dictation software. Please excuse typographical errors. *

## 2023-07-10 NOTE — Plan of Care (Signed)

## 2023-07-10 NOTE — TOC Progression Note (Signed)
 Transition of Care [facility]) - Progression Note    Patient Details  Name: Jeff Key MRN: [id number] Date of Birth: Mar 30, 1945  [facility] CM/SW Contact  Baird Bombard, RN Phone Number: 07/10/2023, 11:46 AM  Clinical Narrative:    TOC continuing to follow patient's progress throughout discharge planning.        Expected Discharge Plan and Services                                               Social Determinants of Health (SDOH) Interventions SDOH Screenings   Food Insecurity: No Food Insecurity (07/07/2023)  Housing: Low Risk  (07/07/2023)  Transportation Needs: Patient Unable To Answer (07/07/2023)  Utilities: Patient Unable To Answer (07/07/2023)  Financial Resource Strain: Low Risk  (07/13/2022)   Received from [facility], [facility]  Social Connections: Unknown (07/07/2023)  Tobacco Use: Medium Risk (07/05/2023)    Readmission Risk Interventions     No data to display

## 2023-07-11 DIAGNOSIS — A419 Sepsis, unspecified organism: Secondary | ICD-10-CM | POA: Diagnosis not present

## 2023-07-11 DIAGNOSIS — K8000 Calculus of gallbladder with acute cholecystitis without obstruction: Secondary | ICD-10-CM

## 2023-07-11 DIAGNOSIS — K651 Peritoneal abscess: Secondary | ICD-10-CM

## 2023-07-11 DIAGNOSIS — R339 Retention of urine, unspecified: Secondary | ICD-10-CM

## 2023-07-11 DIAGNOSIS — R6520 Severe sepsis without septic shock: Secondary | ICD-10-CM | POA: Diagnosis not present

## 2023-07-11 DIAGNOSIS — B9620 Unspecified Escherichia coli [E. coli] as the cause of diseases classified elsewhere: Secondary | ICD-10-CM

## 2023-07-11 LAB — COMPREHENSIVE METABOLIC PANEL WITH GFR
ALT: 25 U/L (ref 0–44)
AST: 28 U/L (ref 15–41)
Albumin: 2.3 g/dL — ABNORMAL LOW (ref 3.5–5.0)
Alkaline Phosphatase: 76 U/L (ref 38–126)
Anion gap: 9 (ref 5–15)
BUN: 16 mg/dL (ref 8–23)
CO2: 24 mmol/L (ref 22–32)
Calcium: 8.3 mg/dL — ABNORMAL LOW (ref 8.9–10.3)
Chloride: 101 mmol/L (ref 98–111)
Creatinine, Ser: 0.88 mg/dL (ref 0.61–1.24)
GFR, Estimated: >60 mL/min (ref >60)
Glucose, Bld: 160 mg/dL — ABNORMAL HIGH (ref 70–99)
Potassium: 3.4 mmol/L — ABNORMAL LOW (ref 3.5–5.1)
Sodium: 134 mmol/L — ABNORMAL LOW (ref 135–145)
Total Bilirubin: 0.7 mg/dL (ref 0.0–1.2)
Total Protein: 6.9 g/dL (ref 6.5–8.1)

## 2023-07-11 LAB — CBC WITH DIFFERENTIAL/PLATELET
Abs Immature Granulocytes: 0.98 10*3/uL — ABNORMAL HIGH (ref 0.00–0.07)
Basophils Absolute: 0.1 10*3/uL (ref 0.0–0.1)
Basophils Relative: 1 %
Eosinophils Absolute: 0.9 10*3/uL — ABNORMAL HIGH (ref 0.0–0.5)
Eosinophils Relative: 5 %
HCT: 32 % — ABNORMAL LOW (ref 39.0–52.0)
Hemoglobin: 10.3 g/dL — ABNORMAL LOW (ref 13.0–17.0)
Immature Granulocytes: 6 %
Lymphocytes Relative: 10 %
Lymphs Abs: 1.6 10*3/uL (ref 0.7–4.0)
MCH: 29 pg (ref 26.0–34.0)
MCHC: 32.2 g/dL (ref 30.0–36.0)
MCV: 90.1 fL (ref 80.0–100.0)
Monocytes Absolute: 0.8 10*3/uL (ref 0.1–1.0)
Monocytes Relative: 5 %
Neutro Abs: 11.7 10*3/uL — ABNORMAL HIGH (ref 1.7–7.7)
Neutrophils Relative %: 73 %
Platelets: 428 10*3/uL — ABNORMAL HIGH (ref 150–400)
RBC: 3.55 MIL/uL — ABNORMAL LOW (ref 4.22–5.81)
RDW: 14.1 % (ref 11.5–15.5)
Smear Review: NORMAL
WBC: 16.1 10*3/uL — ABNORMAL HIGH (ref 4.0–10.5)
nRBC: 0 % (ref 0.0–0.2)

## 2023-07-11 LAB — URINALYSIS, COMPLETE (UACMP) WITH MICROSCOPIC
Bacteria, UA: NONE SEEN
Bilirubin Urine: NEGATIVE
Glucose, UA: NEGATIVE mg/dL
Ketones, ur: NEGATIVE mg/dL
Leukocytes,Ua: NEGATIVE
Nitrite: NEGATIVE
Protein, ur: 100 mg/dL — AB
RBC / HPF: >50 RBC/hpf (ref 0–5)
Specific Gravity, Urine: 1.042 — ABNORMAL HIGH (ref 1.005–1.030)
Squamous Epithelial / HPF: 0 /HPF (ref 0–5)
WBC, UA: >50 WBC/hpf (ref 0–5)
pH: 5 (ref 5.0–8.0)

## 2023-07-11 LAB — GLUCOSE, CAPILLARY
Glucose-Capillary: 173 mg/dL — ABNORMAL HIGH (ref 70–99)
Glucose-Capillary: 173 mg/dL — ABNORMAL HIGH (ref 70–99)
Glucose-Capillary: 165 mg/dL — ABNORMAL HIGH (ref 70–99)
Glucose-Capillary: 163 mg/dL — ABNORMAL HIGH (ref 70–99)

## 2023-07-11 LAB — PHOSPHORUS: Phosphorus: 3.6 mg/dL (ref 2.5–4.6)

## 2023-07-11 LAB — MAGNESIUM: Magnesium: 2 mg/dL (ref 1.7–2.4)

## 2023-07-11 MED ORDER — POTASSIUM CHLORIDE CRYS ER 20 MEQ PO TBCR
40.0000 meq | EXTENDED_RELEASE_TABLET | Freq: Once | ORAL | Status: AC
  Administered 2023-07-11: 40 meq via ORAL
  Filled 2023-07-11: qty 2

## 2023-07-11 MED ORDER — TAMSULOSIN HCL 0.4 MG PO CAPS: 0.4000 mg | ORAL_CAPSULE | Freq: Every day | ORAL | Status: DC

## 2023-07-11 NOTE — Progress Notes (Signed)
 Chief Complaint: S/p image guided percutaneous cholecystomy drain placement on 07/05/23 by Dr. Julietta Ogren  Referring Provider(s): Dr. Rosea Conch  Supervising Physician: Fernando Hoyer  Patient Status: [facility] - In-pt  History of Present Illness: Jeff Key is a 78 y.o. male with a history of advanced dementia nonverbal at baseline, ischemic cardiomyopathy, and inferior STEMI in 2017 now on ASA and Plavix . An ED workup for fever, N/V revealed elevated lactic, WBC 27.1 and CT concerning for cholelithiasis with acute cholecystitis.  IR consulted for percutaneous cholecystostomy tube placement as patient is on antiplatelets and a poor surgical candidate.  Perc chole drain placed 07/05/23 by Dr. Julietta Ogren.  Since placement, patient's WBC have remained persistently high leading to repeat CT 07/10/23 which revealed new peripherally enhancing fluid collections in the pelvis measuring 5.4 x 1.2 cm and 3.6 x 1.5 cm, suspicious for abscesses. IR contacted for possible aspiration and drain placement into fluid collection. Case reviewed by Dr. Marne Sings, determined collections too small for drain placement.  Patient continues to be on IR round list following perc chole drain. During today's exam, patient is lying in bed, pleasantly confused, being fed breakfast by his wife.    Patient is Full Code  Past Medical History:  Diagnosis Date   Anemia ~ 11/2015   CAD (coronary artery disease)    a. inf STEMI on 02/10/16. RCA treated with DES followed by staged PCI of LAD and Circumflex 02/14/16.   Dementia (HCC) dx'd 01/2016   GERD (gastroesophageal reflux disease)    History of stomach ulcers 1980s?   Ischemic cardiomyopathy    a. 01/2016: EF 40-45%.     Past Surgical History:  Procedure Laterality Date   CARDIAC CATHETERIZATION N/A 02/10/2016   Procedure: Left Heart Cath and Coronary Angiography;  Surgeon: Avanell Leigh, MD;  Location: [facility] INVASIVE CV LAB;  Service: Cardiovascular;  Laterality: N/A;    CARDIAC CATHETERIZATION N/A 02/10/2016   Procedure: Coronary Stent Intervention;  Surgeon: Avanell Leigh, MD;  Location: [facility] INVASIVE CV LAB;  Service: Cardiovascular;  Laterality: N/A;   CARDIAC CATHETERIZATION N/A 02/14/2016   Procedure: Coronary Stent Intervention-LAD and CFX;  Surgeon: Avanell Leigh, MD;  Location: [facility] INVASIVE CV LAB;  Service: Cardiovascular;  Laterality: N/A;   COLONOSCOPY WITH PROPOFOL  N/A 05/06/2015   Procedure: COLONOSCOPY WITH PROPOFOL ;  Surgeon: Stephens Eis, MD;  Location: [facility] ENDOSCOPY;  Service: Gastroenterology;  Laterality: N/A;   CORONARY ANGIOPLASTY WITH STENT PLACEMENT  02/14/2016   "2 stents"   ESOPHAGOGASTRODUODENOSCOPY (EGD) WITH PROPOFOL  N/A 05/06/2015   Procedure: ESOPHAGOGASTRODUODENOSCOPY (EGD) WITH PROPOFOL ;  Surgeon: Stephens Eis, MD;  Location: [facility] ENDOSCOPY;  Service: Gastroenterology;  Laterality: N/A;   FOREARM FRACTURE SURGERY Right 1985   "got it tore up in [place]"   FRACTURE SURGERY     IR PERC CHOLECYSTOSTOMY  07/05/2023    Allergies: Patient has no known allergies.  Medications: Prior to Admission medications   Medication Sig Start Date End Date Taking? Authorizing Provider  aspirin  EC 81 MG tablet Take 81 mg by mouth daily.   Yes [provider]  atorvastatin  (LIPITOR ) 20 MG tablet Take 1 tablet (20 mg total) by mouth daily. 11/21/22  Yes Avanell Leigh, MD  balsalazide (COLAZAL ) 750 MG capsule Take 2,250 mg by mouth 3 (three) times daily. 09/23/19  Yes [provider]  clopidogrel  (PLAVIX ) 75 MG tablet Take 1 tablet (75 mg total) by mouth daily. 11/21/22  Yes Avanell Leigh, MD  escitalopram  (LEXAPRO ) 5 MG  tablet Take 5 mg by mouth daily. 08/29/19  Yes [provider]  ferrous sulfate  325 (65 FE) MG tablet Take 325 mg by mouth daily with breakfast.   Yes [provider]  galantamine  (RAZADYNE ) 12 MG tablet Take 12 mg by mouth 2 (two) times daily with a meal. 09/02/19  Yes [provider]   lisinopril  (ZESTRIL ) 10 MG tablet Take 1 tablet (10 mg total) by mouth daily. 11/21/22  Yes Avanell Leigh, MD  memantine  (NAMENDA ) 10 MG tablet Take 10 mg by mouth 2 (two) times daily. 08/07/19  Yes [provider]  metFORMIN (GLUCOPHAGE) 500 MG tablet Take 500 mg by mouth 2 (two) times daily with a meal.   Yes [provider]  nitroGLYCERIN  (NITROSTAT ) 0.4 MG SL tablet Place 1 tablet (0.4 mg total) under the tongue every 5 (five) minutes as needed for chest pain. 02/15/16  Yes Ardia Kraft, PA-C  vitamin B-12 (CYANOCOBALAMIN ) 1000 MCG tablet Take 1,000 mcg by mouth daily.   Yes [provider]  metoprolol  tartrate (LOPRESSOR ) 25 MG tablet Hold until followup with outpatient provider since heart rate was low without it. Patient not taking: Reported on 07/05/2023 11/20/21   Garrison Kanner, MD  omeprazole (PRILOSEC) 20 MG capsule Take 20 mg by mouth daily. Patient not taking: Reported on 07/05/2023    [provider]     Family History  Problem Relation Age of Onset   Prostate cancer Father    Bladder Cancer Neg Hx    Kidney cancer Neg Hx     Social History   Socioeconomic History   Marital status: Married    Spouse name: Not on file   Number of children: Not on file   Years of education: Not on file   Highest education level: Not on file  Occupational History   Not on file  Tobacco Use   Smoking status: Former    Current packs/day: 0.00    Average packs/day: 2.0 packs/day for 30.0 years (60.0 ttl pk-yrs)    Types: Cigarettes    Start date: 60    Quit date: 76    Years since quitting: 35.3   Smokeless tobacco: Never  Substance and Sexual Activity   Alcohol  use: No    Alcohol /week: 0.0 standard drinks of alcohol    Drug use: No   Sexual activity: Never  Other Topics Concern   Not on file  Social History Narrative   Not on file   Social Drivers of Health   Financial Resource Strain: Low Risk  (07/13/2022)   Received from [facility]   Overall Financial Resource Strain (CARDIA)    Difficulty of Paying Living Expenses: Not hard at all  Food Insecurity: No Food Insecurity (07/07/2023)   Hunger Vital Sign    Worried About Running Out of Food in the Last Year: Never true    Ran Out of Food in the Last Year: Never true  Transportation Needs: Patient Unable To Answer (07/07/2023)   PRAPARE - Transportation    Lack of Transportation (Medical): Patient unable to answer    Lack of Transportation (Non-Medical): Patient unable to answer  Physical Activity: Not on file  Stress: Not on file  Social Connections: Unknown (07/07/2023)   Social Connection and Isolation Panel [NHANES]    Frequency of Communication with Friends and Family: Patient unable to answer    Frequency of Social Gatherings with Friends and Family: Patient unable to answer  Attends Religious Services: Patient unable to answer    Active Member of Clubs or Organizations: Patient unable to answer    Attends Club or Organization Meetings: Patient unable to answer    Marital Status: Married     Review of Systems limited by advanced dementia. No verbal responses to questions. Wife denies any new complaints from patient. She endorses good appetite, no emesis.     Vital Signs: BP 138/75 (BP Location: Right Arm)   Pulse 67   Temp 97.9 F (36.6 C)   Resp 19   Ht 5\' 10"  (1.778 m)   Wt 240 lb 1.3 oz (108.9 kg)   SpO2 98%   BMI 34.45 kg/m   Advance Care Plan: No documents on file    Physical Exam Cardiovascular:     Rate and Rhythm: Normal rate and regular rhythm.     Pulses: Normal pulses.  Pulmonary:     Effort: Pulmonary effort is normal.  Abdominal:     Palpations: Abdomen is soft.     Tenderness: There is no abdominal tenderness. There is no guarding.     Comments: Per chole drain present and unremarkable at insertion site. Stat lock and ext sutures intact. Mucous-like drainage with scant  blood present in bag.   Skin:    General: Skin is warm and dry.  Neurological:     Mental Status: He is alert. Mental status is at baseline. He is disoriented.     Imaging: CT ABDOMEN PELVIS W CONTRAST Result Date: 07/10/2023 CLINICAL DATA:  Percutaneous chole tube. Persistent leukocytosis. Concern for abscess and worsening infection. Emesis. Advanced dementia. EXAM: CT ABDOMEN AND PELVIS WITH CONTRAST TECHNIQUE: Multidetector CT imaging of the abdomen and pelvis was performed using the standard protocol following bolus administration of intravenous contrast. RADIATION DOSE REDUCTION: This exam was performed according to the departmental dose-optimization program which includes automated exposure control, adjustment of the mA and/or kV according to patient size and/or use of iterative reconstruction technique. CONTRAST:  OMNIPAQUE  IOHEXOL  300 MG/ML  SOLN COMPARISON:  07/05/2023 FINDINGS: Lower chest: Bronchial wall thickening and peribronchovascular infiltrates in the lower lobes. Small bilateral pleural effusions. Hepatobiliary: Hepatic steatosis. Percutaneous cholecystostomy tube in the decompressed gallbladder. Gallbladder wall thickening and hyperenhancement with trace adjacent stranding may be due to ongoing infection/inflammation or decompression. No biliary dilation or radiopaque stone. Pancreas: Unremarkable. Spleen: Unremarkable. Adrenals/Urinary Tract: Normal adrenal glands. No urinary calculi or hydronephrosis. Decompressed bladder about a Foley catheter. Adjacent perivesical fat stranding. Stomach/Bowel: Normal caliber large and small bowel without bowel wall thickening. Stomach and appendix are within normal limits. Vascular/Lymphatic: Aortic atherosclerosis. No enlarged abdominal or pelvic lymph nodes. Reproductive: No acute abnormality. Other: Peripherally enhancing fluid collection in the pelvis measuring 5.4 x 1.2 cm communicating with a second peripherally enhancing fluid collection  measuring 3.6 x 1.5 cm (series 2/image 89 and 87). Adjacent inflammatory fat stranding. No free intraperitoneal air. Musculoskeletal: No acute fracture. IMPRESSION: 1. Peripherally enhancing fluid collections in the pelvis measuring 5.4 x 1.2 cm and 3.6 x 1.5 cm, suspicious for abscesses. 2. Percutaneous cholecystostomy tube in the decompressed gallbladder. Gallbladder wall thickening and hyperenhancement with trace adjacent stranding may be due to ongoing infection/inflammation or decompression. 3. Foley catheter in the decompressed bladder. Perivesical stranding and bladder wall thickening. Correlate with urinalysis for cystitis. 4. Bronchial wall thickening and peribronchovascular infiltrates in the lower lobes, suspicious for aspiration/bronchopneumonia. Small bilateral pleural effusions. 5. Hepatic steatosis. 6. Aortic Atherosclerosis (ICD10-I70.0). Electronically Signed   By: Rozell Cornet  M.D.   On: 07/10/2023 17:43   DG Abd Portable 1V Result Date: 07/06/2023 CLINICAL DATA:  Abdominal distension EXAM: PORTABLE ABDOMEN - 1 VIEW COMPARISON:  CT abdomen and pelvis dated 07/05/2023 FINDINGS: Percutaneous cholecystostomy drain in-situ. Mild gas-filled dilation of right lower quadrant colon. Mild gas-filled dilation of the stomach. Remainder bowel gas appears nonobstructive. No free air or pneumatosis. No abnormal radio-opaque calculi or mass effect. No acute or substantial osseous abnormality. The sacrum and coccyx are partially obscured by overlying bowel contents. IMPRESSION: 1. Mild gas-filled dilation of the stomach and right lower quadrant colon, which may represent ileus. Remainder bowel gas appears nonobstructive. 2. Percutaneous cholecystostomy drain in-situ. Electronically Signed   By: Limin  Xu M.D.   On: 07/06/2023 15:18   DG Chest 1 View Result Date: 07/06/2023 CLINICAL DATA:  78 year old male with congestive heart failure. EXAM: CHEST  1 VIEW COMPARISON:  CT Chest, Abdomen, and Pelvis 0354  hours today. FINDINGS: Portable AP semi upright view at 0612 hours. Stable low lung volumes, cardiomegaly and mediastinal contours. No pneumothorax or pulmonary edema. Left lung subpleural opacity better demonstrated by CT and resembling atelectasis or scarring. Stable ventilation from the CT this morning. Visualized tracheal air column is within normal limits. Stable visualized osseous structures. Stable visible bowel gas pattern. IMPRESSION: Stable low lung volumes with subpleural left lung atelectasis or scarring as per CT this morning. No new cardiopulmonary abnormality. Electronically Signed   By: Marlise Simpers M.D.   On: 07/06/2023 06:44   IR Perc Cholecystostomy Result Date: 07/05/2023 INDICATION: 78 year old with sepsis and acute cholecystitis. Patient is not a surgical candidate. EXAM: PLACEMENT OF PERCUTANEOUS CHOLECYSTOSTOMY TUBE USING ULTRASOUND AND FLUOROSCOPIC GUIDANCE MEDICATIONS: Fentanyl  100 mcg ANESTHESIA/SEDATION: The patient's level of consciousness and vital signs were monitored continuously by radiology nursing throughout the procedure under my direct supervision. FLUOROSCOPY TIME:  Radiation Exposure Index (as provided by the fluoroscopic device): 28 mGy Kerma COMPLICATIONS: None immediate. PROCEDURE: Patient has severe dementia and cannot communicate. Informed written consent was obtained from the patient's wife after a thorough discussion of the procedural risks, benefits and alternatives. All questions were addressed. Maximal Sterile Barrier Technique was utilized including caps, mask, sterile gowns, sterile gloves, sterile drape, hand hygiene and skin antiseptic. A timeout was performed prior to the initiation of the procedure. Patient was placed supine on the interventional table. The gallbladder was identified with ultrasound. Ultrasound image was saved for documentation. Right upper abdomen was prepped with chlorhexidine  and sterile field was created. Skin was anesthetized using 1%  lidocaine . A small incision was made. Using ultrasound guidance, a 21 gauge needle was directed into the gallbladder via a transhepatic approach. 0.018 wire was advanced into the gallbladder using fluoroscopic guidance. A transitional dilator set was placed and purulent fluid was aspirated. J wire was placed and the tract was dilated to accommodate a 10 [place] multipurpose drain. Approximately 40 mL of purulent fluid was aspirated from the gallbladder. Small amount of contrast was injected to confirm placement as well. Drain was sutured to skin and attached to a gravity bag. Dressing was placed. Fluoroscopic and ultrasound images were taken and saved for documentation. FINDINGS: Mild distention of the gallbladder. Drain was placed using a transhepatic approach. 40 mL of purulent fluid was removed from the gallbladder. IMPRESSION: Successful percutaneous cholecystostomy tube placement using ultrasound and fluoroscopic guidance. Gallbladder fluid was sent for culture. Electronically Signed   By: Elene Griffes M.D.   On: 07/05/2023 17:43   ECHOCARDIOGRAM COMPLETE Result Date: 07/05/2023  ECHOCARDIOGRAM REPORT   Patient Name:   LANTZ HERMANN Date of Exam: 07/05/2023 Medical Rec #:  [id number]        Height:       70.0 in Accession #:    [id number]       Weight:       240.0 lb Date of Birth:  07/04/1945        BSA:          2.255 m Patient Age:    78 years         BP:           115/79 mmHg Patient Gender: M                HR:           80 bpm. Exam Location:  [facility] Procedure: 2D Echo, 3D Echo, Cardiac Doppler and Color Doppler (Both Spectral            and Color Flow Doppler were utilized during procedure). Indications:     CHF  History:         Patient has prior history of Echocardiogram examinations, most                  recent 05/18/2016. CHF and Cardiomyopathy, CAD and Previous                  Myocardial Infarction, Arrythmias:Tachycardia,                  Signs/Symptoms:Alzheimer's; Risk Factors:Dyslipidemia.   Sonographer:     Clarke Crouch Referring Phys:  [id number] [place] Diagnosing Phys: Belva Boyden MD  Sonographer Comments: Technically difficult study due to poor echo windows, no subcostal window and patient is obese. IMPRESSIONS  1. Left ventricular ejection fraction, by estimation, is 55 to 60%. The left ventricle has normal function. The left ventricle has no regional wall motion abnormalities. Left ventricular diastolic parameters are consistent with Grade I diastolic dysfunction (impaired relaxation).  2. Right ventricular systolic function is normal. The right ventricular size is normal.  3. The mitral valve is normal in structure. Mild mitral valve regurgitation. No evidence of mitral stenosis.  4. The aortic valve is normal in structure. There is mild calcification of the aortic valve. Aortic valve regurgitation is not visualized. Aortic valve sclerosis is present, with no evidence of aortic valve stenosis.  5. There is mild dilatation of the aortic root, measuring 40 mm.  6. The inferior vena cava is normal in size with greater than 50% respiratory variability, suggesting right atrial pressure of 3 mmHg. FINDINGS  Left Ventricle: Left ventricular ejection fraction, by estimation, is 55 to 60%. The left ventricle has normal function. The left ventricle has no regional wall motion abnormalities. Strain was performed and the global longitudinal strain is indeterminate. The left ventricular internal cavity size was normal in size. There is no left ventricular hypertrophy. Left ventricular diastolic parameters are consistent with Grade I diastolic dysfunction (impaired relaxation). Right Ventricle: The right ventricular size is normal. No increase in right ventricular wall thickness. Right ventricular systolic function is normal. Left Atrium: Left atrial size was normal in size. Right Atrium: Right atrial size was normal in size. Pericardium: There is no evidence of pericardial effusion. Mitral Valve:  The mitral valve is normal in structure. Mild mitral valve regurgitation. No evidence of mitral valve stenosis. MV peak gradient, 8.0 mmHg. The mean mitral valve gradient is 4.0 mmHg. Tricuspid Valve: The tricuspid valve  is normal in structure. Tricuspid valve regurgitation is mild . No evidence of tricuspid stenosis. Aortic Valve: The aortic valve is normal in structure. There is mild calcification of the aortic valve. Aortic valve regurgitation is not visualized. Aortic valve sclerosis is present, with no evidence of aortic valve stenosis. Aortic valve mean gradient  measures 10.7 mmHg. Aortic valve peak gradient measures 21.1 mmHg. Aortic valve area, by VTI measures 1.83 cm. Pulmonic Valve: The pulmonic valve was normal in structure. Pulmonic valve regurgitation is not visualized. No evidence of pulmonic stenosis. Aorta: The aortic root is normal in size and structure. There is mild dilatation of the aortic root, measuring 40 mm. Venous: The inferior vena cava is normal in size with greater than 50% respiratory variability, suggesting right atrial pressure of 3 mmHg. IAS/Shunts: No atrial level shunt detected by color flow Doppler. Additional Comments: 3D was performed not requiring image post processing on an independent workstation and was indeterminate.  LEFT VENTRICLE PLAX 2D LVIDd:         4.90 cm     Diastology LVIDs:         3.60 cm     LV e' medial:    5.98 cm/s LV PW:         1.70 cm     LV E/e' medial:  17.1 LV IVS:        2.00 cm     LV e' lateral:   5.98 cm/s LVOT diam:     2.30 cm     LV E/e' lateral: 17.1 LV SV:         85 LV SV Index:   38 LVOT Area:     4.15 cm  LV Volumes (MOD) LV vol d, MOD A2C: 91.6 ml LV vol d, MOD A4C: 80.3 ml LV vol s, MOD A2C: 47.8 ml LV vol s, MOD A4C: 49.6 ml LV SV MOD A2C:     43.8 ml LV SV MOD A4C:     80.3 ml LV SV MOD BP:      37.7 ml RIGHT VENTRICLE RV Basal diam:  3.65 cm RV Mid diam:    3.50 cm RV S prime:     14.00 cm/s RVOT diam:      4.20 cm TAPSE (M-mode):  2.8 cm LEFT ATRIUM             Index        RIGHT ATRIUM           Index LA Vol (A2C):   29.9 ml 13.26 ml/m  RA Area:     13.30 cm LA Vol (A4C):   38.9 ml 17.25 ml/m  RA Volume:   25.40 ml  11.26 ml/m LA Biplane Vol: 37.0 ml 16.40 ml/m  AORTIC VALVE                     PULMONIC VALVE AV Area (Vmax):    1.83 cm      PV Vmax:       0.90 m/s AV Area (Vmean):   1.66 cm      PV Peak grad:  3.2 mmHg AV Area (VTI):     1.83 cm AV Vmax:           229.67 cm/s AV Vmean:          149.000 cm/s AV VTI:            0.463 m AV Peak Grad:      21.1 mmHg AV  Mean Grad:      10.7 mmHg LVOT Vmax:         101.00 cm/s LVOT Vmean:        59.700 cm/s LVOT VTI:          0.204 m LVOT/AV VTI ratio: 0.44  AORTA Ao Root diam: 4.00 cm Ao Asc diam:  3.50 cm MITRAL VALVE MV Area (PHT): 2.34 cm     SHUNTS MV Area VTI:   1.87 cm     Systemic VTI:  0.20 m MV Peak grad:  8.0 mmHg     Systemic Diam: 2.30 cm MV Mean grad:  4.0 mmHg     Pulmonic Diam: 4.20 cm MV Vmax:       1.41 m/s MV Vmean:      88.9 cm/s MV Decel Time: 324 msec MV E velocity: 102.00 cm/s MV A velocity: 127.00 cm/s MV E/A ratio:  0.80 Belva Boyden MD Electronically signed by Belva Boyden MD Signature Date/Time: 07/05/2023/2:38:26 PM    Final    CT CHEST ABDOMEN PELVIS W CONTRAST Result Date: 07/05/2023 CLINICAL DATA:  Vomiting, abdominal pain, and sepsis. EXAM: CT CHEST, ABDOMEN, AND PELVIS WITH CONTRAST TECHNIQUE: Multidetector CT imaging of the chest, abdomen and pelvis was performed following the standard protocol during bolus administration of intravenous contrast. RADIATION DOSE REDUCTION: This exam was performed according to the departmental dose-optimization program which includes automated exposure control, adjustment of the mA and/or kV according to patient size and/or use of iterative reconstruction technique. CONTRAST:  80mL OMNIPAQUE  IOHEXOL  300 MG/ML  SOLN COMPARISON:  Portable chest today, abdomen and pelvis CT with contrast 06/29/2023, and chest, abdomen  and pelvis CT with IV contrast 11/16/2021. FINDINGS: CT CHEST FINDINGS Cardiovascular: There is mild cardiomegaly. Three-vessel coronary artery calcifications with prior LAD stenting. There are patchy calcific plaques in the thoracic aorta, scattered calcification in the great vessels. No aneurysm, dissection or stenosis. There are calcifications and thickening of the aortic valve leaflets. Consider echocardiographic follow-up to assess valvular function. There is no pericardial effusion. The pulmonary arteries and veins are normal in caliber. The pulmonary arteries are centrally clear. Mediastinum/Nodes: Chronic mediastinal lipomatosis. No adenopathy. Hiatal fat hernia. Lungs/Pleura: Chronic left-sided volume loss and posterior basal atelectasis. There is respiratory motion. Mild posterior atelectasis on the right. No consolidation or nodule is seen through the breathing motion. No effusion. Trachea and central airways are clear Musculoskeletal: 1 cm bone island again noted T1 vertebral body. No acute or other significant osseous findings. Degenerative disc disease and spondylosis mid to lower thoracic spine. The ribcage is intact. Small amount of gynecomastia noted bilaterally. CT ABDOMEN PELVIS FINDINGS Hepatobiliary: The liver is 24 cm in length and moderately steatotic. There is no mass enhancement. New compared with 6 days ago, there is increased gallbladder wall thickening, pericholecystic fluid and pericholecystic edema. Findings are most likely due to acute cholecystitis. There is a single 1 cm partially calcified stone in the proximal gallbladder. There are no other visible stones. There is no intrahepatic or extrahepatic bile duct dilatation. Pancreas: Partially atrophic.  Otherwise unremarkable. Spleen: Unremarkable. Adrenals/Urinary Tract: There is no adrenal or renal mass enhancement. There are 2 punctate nonobstructive caliceal stones in the superior pole of the right kidney. No left nephrolithiasis  is seen and no ureteral stones or hydronephrosis bilaterally. The bladder is contracted and not well seen. Stomach/Bowel: No dilatation or wall thickening including the appendix. Scattered colonic diverticula without diverticulitis. Vascular/Lymphatic: Aortic atherosclerosis. No enlarged abdominal or pelvic lymph nodes. Reproductive: No  prostatomegaly. Other: No pelvic ascites. Trace reactive fluid right paracolic gutter. No free air, free hemorrhage or abscess. Small umbilical and bilateral inguinal fat hernias. Musculoskeletal: Degenerative change and mild dextroscoliosis lumbar spine. Mild hip DJD. No acute or significant regional osseous findings. IMPRESSION: 1. No acute chest CT findings. 2. Cardiomegaly with aortic and coronary artery atherosclerosis. 3. Calcifications and thickening of the aortic valve leaflets. Consider echocardiographic follow-up to assess valvular function. 4. Hiatal fat hernia. 5. Chronic left-sided volume loss and posterior basal atelectasis. 6. Cholelithiasis with increased gallbladder wall thickening, pericholecystic fluid and pericholecystic edema, most likely due to acute cholecystitis. 7. Hepatomegaly and steatosis. 8. Nonobstructive right micronephrolithiasis. 9. Diverticulosis without evidence of diverticulitis. 10. Umbilical and inguinal fat hernias. 11. Critical Value/emergent results were called by telephone at the time of interpretation on 07/05/2023 at 4:22 am to provider [facility] , who verbally acknowledged these results. Electronically Signed   By: Denman Fischer M.D.   On: 07/05/2023 04:36   DG Chest Port 1 View Result Date: 07/05/2023 CLINICAL DATA:  Possible sepsis EXAM: PORTABLE CHEST 1 VIEW COMPARISON:  06/28/2023 FINDINGS: Stable cardiomegaly. Lungs are hypoinflated but clear. No bony abnormality is noted. IMPRESSION: Unchanged from the prior exam. Electronically Signed   By: Violeta Grey M.D.   On: 07/05/2023 02:08   CT ABDOMEN PELVIS W CONTRAST Result Date:  06/29/2023 CLINICAL DATA:  Increased lethargy. EXAM: CT ABDOMEN AND PELVIS WITH CONTRAST TECHNIQUE: Multidetector CT imaging of the abdomen and pelvis was performed using the standard protocol following bolus administration of intravenous contrast. RADIATION DOSE REDUCTION: This exam was performed according to the departmental dose-optimization program which includes automated exposure control, adjustment of the mA and/or kV according to patient size and/or use of iterative reconstruction technique. CONTRAST:  OMNIPAQUE  IOHEXOL  350 MG/ML SOLN COMPARISON:  November 16, 2021 FINDINGS: Lower chest: Mild to moderate severity scarring and/or atelectasis is seen within the left lung base. Hepatobiliary: There is diffuse fatty infiltration of the liver parenchyma. A punctate gallstone is seen within the gallbladder without evidence of gallbladder wall thickening or biliary dilatation. Pancreas: Unremarkable. No pancreatic ductal dilatation or surrounding inflammatory changes. Spleen: Normal in size without focal abnormality. Adrenals/Urinary Tract: Adrenal glands are unremarkable. Kidneys are normal in size, without obstructing renal calculi, focal lesion, or hydronephrosis. A punctate nonobstructing renal calculus is seen within the upper pole of the right kidney. Bladder is unremarkable. Stomach/Bowel: There is a small hiatal hernia. Appendix appears normal. No evidence of bowel wall thickening, distention, or inflammatory changes. Vascular/Lymphatic: Aortic atherosclerosis. No enlarged abdominal or pelvic lymph nodes. Reproductive: Prostate is unremarkable. Other: A small, stable fat containing umbilical hernia is seen. No abdominopelvic ascites. Musculoskeletal: Multilevel degenerative changes are seen throughout the lumbar spine. IMPRESSION: 1. Fatty liver. 2. Cholelithiasis. 3. Punctate nonobstructing right renal calculus. 4. Small hiatal hernia. 5. Small, stable fat containing umbilical hernia. 6. Aortic  atherosclerosis. Electronically Signed   By: Virgle Grime M.D.   On: 06/29/2023 03:40   DG Chest Port 1 View Result Date: 06/28/2023 CLINICAL DATA:  Lethargy EXAM: PORTABLE CHEST 1 VIEW COMPARISON:  11/16/2021 FINDINGS: Low lung volumes. Cardiomegaly with aortic atherosclerosis. No consolidation, pleural effusion, or pneumothorax IMPRESSION: Low lung volumes. Cardiomegaly. Electronically Signed   By: Esmeralda Hedge M.D.   On: 06/28/2023 23:19    Labs:  CBC: Recent Labs    07/08/23 0533 07/09/23 0509 07/10/23 0525 07/11/23 0517  WBC 16.2* 15.5* 17.0* 16.1*  HGB 10.4* 10.5* 10.5* 10.3*  HCT 31.6* 31.9*  32.0* 32.0*  PLT 376 419* 435* 428*    COAGS: Recent Labs    06/28/23 2057 07/05/23 0145  INR 1.0 1.2    BMP: Recent Labs    07/08/23 0533 07/09/23 0509 07/10/23 0525 07/11/23 0517  NA 136 135 138 134*  K 3.5 3.3* 3.5 3.4*  CL 104 101 103 101  CO2 26 25 24 24   GLUCOSE 156* 166* 166* 160*  BUN 13 14 15 16   CALCIUM  8.1* 7.9* 8.3* 8.3*  CREATININE 0.90 0.91 0.85 0.88  GFRNONAA >60 >60 >60 >60    LIVER FUNCTION TESTS: Recent Labs    07/08/23 0533 07/09/23 0509 07/10/23 0525 07/11/23 0517  BILITOT 0.8 0.5 0.6 0.7  AST 66* 71* 35 28  ALT 32 39 30 25  ALKPHOS 77 82 86 76  PROT 6.6 6.7 7.0 6.9  ALBUMIN 2.3* 2.4* 2.6* 2.3*    Assessment and Plan:  Drain Location: RUQ Size: Fr size: 10 Fr Date of placement: 07/05/23  Currently to: Drain collection device: gravity 24 hour output:  Output by Drain (mL) 07/09/23 0701 - 07/09/23 1900 07/09/23 1901 - 07/10/23 0700 07/10/23 0701 - 07/10/23 1900 07/10/23 1901 - 07/11/23 0700 07/11/23 0701 - 07/11/23 1125  Biliary Tube Other (Comment) 10 Fr. RUQ  55 40 30    - VSS - WBC 16.1, overall improved from 27 at presentation. No concern for new infection related to drain based on exam  Interval imaging/drain manipulation:  CT 07/10/23: Drain present in decompressed gall bladder Additional fluid collection noted and  addressed in above history.   Current examination: Flushes/aspirates easily. No blood present in aspirate. Scant blood in gravity bag may be leftover from previous incomplete bag drainage.  Insertion site unremarkable. Suture and stat lock in place. Dressed appropriately.   Plan: Continue TID flushes with 5 cc NS. Record output Q shift. Dressing changes QD or PRN if soiled.  Call IR APP or on call IR MD if difficulty flushing or sudden change in drain output.   Discharge planning: Please contact IR APP or on call IR MD prior to patient d/c to ensure appropriate follow up plans are in place. Typically patient will follow up with IR for drain exchange every 2 months. Orders already in place for this. It is anticipated that drain will be long term for this patient.  Wife aware that drain will continue to need to be flushed and dressings changed at home. She intends to continue to be his primary caregiver.  IR will continue to follow - please call with questions or concerns.     Thank you for allowing our service to participate in Jeff [facility] 's care.    Electronically Signed: Terressa Fess, NP   07/11/2023, 11:11 AM     I spent a total of 40 Minutes    in face to face in clinical consultation, greater than 50% of which was counseling/coordinating care for s/p percutaneous cholecystomy drain 07/05/23   (A copy of this note was sent to the referring provider and the time of visit.)

## 2023-07-11 NOTE — Consult Note (Signed)
 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 Was sent home. His wife found him to be lethargic, not eating well and fever over the next week and came to the ED on 4/17  07/05/23 01:23  BP 94/63  Temp 98.7 F (37.1 C)  Pulse Rate 93  Resp 17  SpO2 96 %     Latest Reference Range & Units 07/05/23 01:45  WBC 4.0 - 10.5 K/uL 27.1 (H)  Hemoglobin 13.0 - 17.0 g/dL 13.2 (L)  HCT 44.0 - 10.2 % 36.7 (L)  Platelets 150 - 400 K/uL 346  Creatinine 0.61 - 1.24 mg/dL 7.25 (H)   Blood culture sent CT abdomen showed cholelithiasis, bladder wall thickening and pericholecystic fluid suggestive of acute cholecystitis Seen by surgery - recommend IR placed perc cholecystostomy which he had on 4/18 He was being straight cath for 700cc of urine and then had to get a foley HE has been on zosyn  and wbc down to 17. Plateud- gall bladder fluid is growing ecoli resistant to augmentin , cipro  and bactrim.repeat CT abdomen/pelvis  showed a small pelvic collection I am asked to see the patient for antibiotic management Past Medical History:  Diagnosis Date   Anemia ~ 11/2015   CAD (coronary artery disease)    a. inf STEMI on 02/10/16. RCA treated with DES followed by staged PCI of LAD and Circumflex 02/14/16.   Dementia (HCC) dx'd 01/2016   GERD (gastroesophageal reflux disease)    History of stomach ulcers 1980s?   Ischemic  cardiomyopathy    a. 01/2016: EF 40-45%.     Past Surgical History:  Procedure Laterality Date   CARDIAC CATHETERIZATION N/A 02/10/2016   Procedure: Left Heart Cath and Coronary Angiography;  Surgeon: Avanell Leigh, MD;  Location: [facility] INVASIVE CV LAB;  Service: Cardiovascular;  Laterality: N/A;   CARDIAC CATHETERIZATION N/A 02/10/2016   Procedure: Coronary Stent Intervention;  Surgeon: Avanell Leigh, MD;  Location: [facility] INVASIVE CV LAB;  Service: Cardiovascular;  Laterality: N/A;   CARDIAC CATHETERIZATION N/A 02/14/2016   Procedure: Coronary Stent Intervention-LAD and CFX;  Surgeon: Avanell Leigh, MD;  Location: [facility] INVASIVE CV LAB;  Service: Cardiovascular;  Laterality: N/A;   COLONOSCOPY WITH PROPOFOL  N/A 05/06/2015   Procedure: COLONOSCOPY WITH PROPOFOL ;  Surgeon: Stephens Eis, MD;  Location: [facility] ENDOSCOPY;  Service: Gastroenterology;  Laterality: N/A;   CORONARY ANGIOPLASTY WITH STENT PLACEMENT  02/14/2016   "2 stents"   ESOPHAGOGASTRODUODENOSCOPY (EGD) WITH PROPOFOL  N/A 05/06/2015   Procedure: ESOPHAGOGASTRODUODENOSCOPY (EGD) WITH PROPOFOL ;  Surgeon: Stephens Eis, MD;  Location: [facility] ENDOSCOPY;  Service: Gastroenterology;  Laterality: N/A;   FOREARM FRACTURE SURGERY Right 1985   "got it tore up in [place]"   FRACTURE SURGERY     IR PERC CHOLECYSTOSTOMY  07/05/2023    Social History   Socioeconomic History   Marital status: Married    Spouse name: Not on file   Number of children: Not on file  Years of education: Not on file   Highest education level: Not on file  Occupational History   Not on file  Tobacco Use   Smoking status: Former    Current packs/day: 0.00    Average packs/day: 2.0 packs/day for 30.0 years (60.0 ttl pk-yrs)    Types: Cigarettes    Start date: 26    Quit date: 23    Years since quitting: 35.3   Smokeless tobacco: Never  Substance and Sexual Activity   Alcohol  use: No    Alcohol /week: 0.0 standard drinks of alcohol    Drug use: No   Sexual  activity: Never  Other Topics Concern   Not on file  Social History Narrative   Not on file   Social Drivers of Health   Financial Resource Strain: Low Risk  (07/13/2022)   Received from [facility]   Overall Financial Resource Strain (CARDIA)    Difficulty of Paying Living Expenses: Not hard at all  Food Insecurity: No Food Insecurity (07/07/2023)   Hunger Vital Sign    Worried About Running Out of Food in the Last Year: Never true    Ran Out of Food in the Last Year: Never true  Transportation Needs: Patient Unable To Answer (07/07/2023)   PRAPARE - Transportation    Lack of Transportation (Medical): Patient unable to answer    Lack of Transportation (Non-Medical): Patient unable to answer  Physical Activity: Not on file  Stress: Not on file  Social Connections: Unknown (07/07/2023)   Social Connection and Isolation Panel [NHANES]    Frequency of Communication with Friends and Family: Patient unable to answer    Frequency of Social Gatherings with Friends and Family: Patient unable to answer    Attends Religious Services: Patient unable to answer    Active Member of Clubs or Organizations: Patient unable to answer    Attends Banker Meetings: Patient unable to answer    Marital Status: Married  Catering manager Violence: Patient Unable To Answer (07/07/2023)   Humiliation, Afraid, Rape, and Kick questionnaire    Fear of Current or Ex-Partner: Patient unable to answer    Emotionally Abused: Patient unable to answer    Physically Abused: Patient unable to answer    Sexually Abused: Patient unable to answer    Family History  Problem Relation Age of Onset   Prostate cancer Father    Bladder Cancer Neg Hx    Kidney cancer Neg Hx    No Known Allergies I? Current Facility-Administered Medications  Medication Dose Route Frequency Provider Last Rate Last Admin   acetaminophen  (TYLENOL ) tablet 650 mg  650 mg Oral Q6H PRN  Duncan, Hazel V, MD   650 mg at 07/06/23 2032   aspirin  EC tablet 81 mg  81 mg Oral Daily Antoniette Batty T, MD   81 mg at 07/11/23 0930   atorvastatin  (LIPITOR ) tablet 20 mg  20 mg Oral Daily Antoniette Batty T, MD   20 mg at 07/11/23 0930   Chlorhexidine  Gluconate Cloth 2 % PADS 6 each  6 each Topical Daily Dezii, Alexandra, DO   6 each at 07/11/23 1204   enoxaparin  (LOVENOX ) injection 50 mg  50 mg Subcutaneous Q24H Antoniette Batty T, MD   50 mg at 07/11/23 4782   escitalopram  (LEXAPRO ) tablet 5 mg  5 mg Oral Daily Antoniette Batty T, MD   5 mg at 07/11/23 0930   feeding supplement (ENSURE ENLIVE / ENSURE PLUS) liquid 237  mL  237 mL Oral BID BM Dezii, Alexandra, DO   237 mL at 07/11/23 1204   galantamine  (RAZADYNE ) tablet 8 mg  8 mg Oral BID WC Antoniette Batty T, MD   8 mg at 07/11/23 0931   hydrALAZINE  (APRESOLINE ) injection 5 mg  5 mg Intravenous Q6H PRN [place], MD       insulin  aspart (novoLOG ) injection 0-20 Units  0-20 Units Subcutaneous TID WC Zhang, Ping T, MD   4 Units at 07/11/23 1204   lactulose  (CHRONULAC ) 10 GM/15ML solution 20 g  20 g Oral TID Dezii, Alexandra, DO   20 g at 07/11/23 0930   memantine  (NAMENDA ) tablet 10 mg  10 mg Oral BID Zhang, Ping T, MD   10 mg at 07/11/23 6295   ondansetron  (ZOFRAN ) injection 4 mg  4 mg Intravenous Once Wong, Silas, MD       piperacillin -tazobactam (ZOSYN ) IVPB 3.375 g  3.375 g Intravenous Q8H Hunt, Madison H, RPH 12.5 mL/hr at 07/11/23 0941 3.375 g at 07/11/23 0941   potassium chloride  SA (KLOR-CON  M) CR tablet 40 mEq  40 mEq Oral Once Wouk, Haynes Lips, MD       sodium chloride  flush (NS) 0.9 % injection 5 mL  5 mL Intracatheter Q8H Elene Griffes, MD   5 mL at 07/11/23 2841   tamsulosin  (FLOMAX ) capsule 0.8 mg  0.8 mg Oral Daily Dezii, Alexandra, DO   0.8 mg at 07/11/23 0930     Abtx:  Anti-infectives (From admission, onward)    Start     Dose/Rate Route Frequency Ordered Stop   07/05/23 1130  cefOXitin  (MEFOXIN ) 2 g in sodium chloride  0.9 % 100 mL IVPB         2 g 200 mL/hr over 30 Minutes Intravenous To Radiology 07/05/23 1118 07/05/23 1659   07/05/23 0900  piperacillin -tazobactam (ZOSYN ) IVPB 3.375 g        3.375 g 12.5 mL/hr over 240 Minutes Intravenous Every 8 hours 07/05/23 0805     07/05/23 0230  ceFEPIme  (MAXIPIME ) 2 g in sodium chloride  0.9 % 100 mL IVPB        2 g 200 mL/hr over 30 Minutes Intravenous  Once 07/05/23 0219 07/05/23 0303   07/05/23 0230  metroNIDAZOLE  (FLAGYL ) IVPB 500 mg        500 mg 100 mL/hr over 60 Minutes Intravenous  Once 07/05/23 0219 07/05/23 0347   07/05/23 0230  vancomycin  (VANCOREADY) IVPB 2000 mg/400 mL        2,000 mg 200 mL/hr over 120 Minutes Intravenous  Once 07/05/23 0227 07/05/23 0616       REVIEW OF SYSTEMS:  NA Objective:  VITALS:  BP 115/75 (BP Location: Right Arm)   Pulse 86   Temp 98 F (36.7 C)   Resp 19   Ht 5\' 10"  (1.778 m)   Wt 108.9 kg   SpO2 93%   BMI 34.45 kg/m   PHYSICAL EXAM:  General: Awake, no verbal , does not follow commands Head: Normocephalic, without obvious abnormality, atraumatic. Eyes: Conjunctivae clear, anicteric sclerae. Pupils are equal ENT did not examine Neck:  symmetrical, no adenopathy, thyroid : non tender no carotid bruit and no JVD. Lungs: b/l ai entry. Heart: s1s2 Abdomen: Soft, nrt upper quadrant drain Extremities: atraumatic, no cyanosis. No edema. No clubbing Skin: No rashes or lesions. Or bruising Lymph: Cervical, supraclavicular normal. Neurologic: voluntary stiffness sof limbs Pertinent Labs Lab Results CBC    Component Value Date/Time   WBC 16.1 (  H) 07/11/2023 0517   RBC 3.55 (L) 07/11/2023 0517   HGB 10.3 (L) 07/11/2023 0517   HCT 32.0 (L) 07/11/2023 0517   PLT 428 (H) 07/11/2023 0517   MCV 90.1 07/11/2023 0517   MCH 29.0 07/11/2023 0517   MCHC 32.2 07/11/2023 0517   RDW 14.1 07/11/2023 0517   LYMPHSABS 1.6 07/11/2023 0517   MONOABS 0.8 07/11/2023 0517   EOSABS 0.9 (H) 07/11/2023 0517   BASOSABS 0.1 07/11/2023 0517        Latest Ref Rng & Units 07/11/2023    5:17 AM 07/10/2023    5:25 AM 07/09/2023    5:09 AM  CMP  Glucose 70 - 99 mg/dL 409  811  914   BUN 8 - 23 mg/dL 16  15  14    Creatinine 0.61 - 1.24 mg/dL 7.82  9.56  2.13   Sodium 135 - 145 mmol/L 134  138  135   Potassium 3.5 - 5.1 mmol/L 3.4  3.5  3.3   Chloride 98 - 111 mmol/L 101  103  101   CO2 22 - 32 mmol/L 24  24  25    Calcium  8.9 - 10.3 mg/dL 8.3  8.3  7.9   Total Protein 6.5 - 8.1 g/dL 6.9  7.0  6.7   Total Bilirubin 0.0 - 1.2 mg/dL 0.7  0.6  0.5   Alkaline Phos 38 - 126 U/L 76  86  82   AST 15 - 41 U/L 28  35  71   ALT 0 - 44 U/L 25  30  39       Microbiology: Recent Results (from the past 240 hours)  Blood Culture (routine x 2)     Status: None   Collection Time: 07/05/23  1:25 AM   Specimen: BLOOD RIGHT ARM  Result Value Ref Range Status   Specimen Description   Final    BLOOD RIGHT ARM Performed at [facility], [address] [place], [place] [address]    Special Requests   Final    BOTTLES DRAWN AEROBIC AND ANAEROBIC Blood Culture adequate volume   Culture   Final    NO GROWTH 5 DAYS Performed at [facility], [address] [place] [place], [place] [address]    Report Status 07/10/2023 FINAL  Final  Blood Culture (routine x 2)     Status: None   Collection Time: 07/05/23  1:25 AM   Specimen: BLOOD  Result Value Ref Range Status   Specimen Description BLOOD LAC  Final   Special Requests   Final    BOTTLES DRAWN AEROBIC AND ANAEROBIC Blood Culture adequate volume   Culture   Final    NO GROWTH 5 DAYS Performed at [facility], [address] [place], [place] [address]    Report Status 07/10/2023 FINAL  Final  Resp panel by RT-PCR (RSV, Flu A&B, Covid) Anterior Nasal Swab     Status: None   Collection Time: 07/05/23  1:48 AM   Specimen: Anterior Nasal Swab  Result Value Ref Range Status   SARS Coronavirus 2 by RT PCR NEGATIVE NEGATIVE Final    Comment: (NOTE) SARS-CoV-2 target  nucleic acids are NOT DETECTED.  The SARS-CoV-2 RNA is generally detectable in upper respiratory specimens during the acute phase of infection. The lowest concentration of SARS-CoV-2 viral copies this assay can detect is 138 copies/mL. A negative result does not preclude SARS-Cov-2 infection and should not be used as the sole basis for treatment or  other patient management decisions. A negative result may occur with  improper specimen collection/handling, submission of specimen other than nasopharyngeal swab, presence of viral mutation(s) within the areas targeted by this assay, and inadequate number of viral copies(<138 copies/mL). A negative result must be combined with clinical observations, patient history, and epidemiological information. The expected result is Negative.  Fact Sheet for Patients:  BloggerCourse.com  Fact Sheet for Healthcare Providers:  SeriousBroker.it  This test is no t yet approved or cleared by the [place]  FDA and  has been authorized for detection and/or diagnosis of SARS-CoV-2 by FDA under an Emergency Use Authorization (EUA). This EUA will remain  in effect (meaning this test can be used) for the duration of the COVID-19 declaration under Section 564(b)(1) of the Act, 21 U.S.C.section 360bbb-3(b)(1), unless the authorization is terminated  or revoked sooner.       Influenza A by PCR NEGATIVE NEGATIVE Final   Influenza B by PCR NEGATIVE NEGATIVE Final    Comment: (NOTE) The Xpert Xpress SARS-CoV-2/FLU/RSV plus assay is intended as an aid in the diagnosis of influenza from Nasopharyngeal swab specimens and should not be used as a sole basis for treatment. Nasal washings and aspirates are unacceptable for Xpert Xpress SARS-CoV-2/FLU/RSV testing.  Fact Sheet for Patients: BloggerCourse.com  Fact Sheet for Healthcare  Providers: SeriousBroker.it  This test is not yet approved or cleared by the [place]  FDA and has been authorized for detection and/or diagnosis of SARS-CoV-2 by FDA under an Emergency Use Authorization (EUA). This EUA will remain in effect (meaning this test can be used) for the duration of the COVID-19 declaration under Section 564(b)(1) of the Act, 21 U.S.C. section 360bbb-3(b)(1), unless the authorization is terminated or revoked.     Resp Syncytial Virus by PCR NEGATIVE NEGATIVE Final    Comment: (NOTE) Fact Sheet for Patients: BloggerCourse.com  Fact Sheet for Healthcare Providers: SeriousBroker.it  This test is not yet approved or cleared by the [place]  FDA and has been authorized for detection and/or diagnosis of SARS-CoV-2 by FDA under an Emergency Use Authorization (EUA). This EUA will remain in effect (meaning this test can be used) for the duration of the COVID-19 declaration under Section 564(b)(1) of the Act, 21 U.S.C. section 360bbb-3(b)(1), unless the authorization is terminated or revoked.  Performed at [facility], [address] [place], [place] [address]   Urine Culture     Status: None   Collection Time: 07/05/23  3:08 AM   Specimen: Urine, Random  Result Value Ref Range Status   Specimen Description   Final    URINE, RANDOM Performed at [facility], [address], [place], [place] [address]    Special Requests   Final    NONE Reflexed from [id number] Performed at [facility], [address] [place], [place] [address]    Culture   Final    NO GROWTH Performed at [facility], [address], [place], [place] [address]    Report Status 07/06/2023 FINAL  Final  Aerobic/Anaerobic Culture w Gram Stain (surgical/deep wound)     Status: None (Preliminary result)   Collection Time: 07/05/23  4:26 PM   Specimen: Abscess  Result  Value Ref Range Status   Specimen Description   Final    ABSCESS Performed at [facility], [address], [place], [place] [address]    Special Requests   Final     GALLBLADDER Performed at [facility], [address]  [place], [place] [address]    Gram Stain   Final    MODERATE WBC PRESENT,BOTH PMN AND MONONUCLEAR FEW GRAM NEGATIVE RODS    Culture   Final    ABUNDANT ESCHERICHIA COLI NO ANAEROBES ISOLATED CEFAZOLIN TO FOLLOW Performed at [facility], [address] [place], [place] [address]    Report Status PENDING  Incomplete   Organism ID, Bacteria ESCHERICHIA COLI  Final      Susceptibility   Escherichia coli - MIC*    AMPICILLIN  >=32 RESISTANT Resistant     CEFEPIME  <=0.12 SENSITIVE Sensitive     CEFTAZIDIME <=1 SENSITIVE Sensitive     CEFTRIAXONE  <=0.25 SENSITIVE Sensitive     CIPROFLOXACIN  >=4 RESISTANT Resistant     GENTAMICIN <=1 SENSITIVE Sensitive     IMIPENEM <=0.25 SENSITIVE Sensitive     TRIMETH/SULFA >=320 RESISTANT Resistant     AMPICILLIN /SULBACTAM 16 INTERMEDIATE Intermediate     PIP/TAZO <=4 SENSITIVE Sensitive ug/mL    * ABUNDANT ESCHERICHIA COLI    IMAGING RESULTS: CT abdomen reviewed from 4/22 Pelvic fluid collection I have personally reviewed the films ? Impression/Recommendation Acute cholecystitis - cholelithiasis- s/p Perc cholecystostomy On zosyn  Ecoli in culture- resistant to cipro , bactrim and augmentin  Will ask lab to do MIC of cefazolin. If susceptible can do Po keflex   Urinary retention on foley Bladder wall stranding  AKI-resolved   Pelvic abscess- small and as per IR difficult to drain Is it from cholecystitis?  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 ? I have personally spent  -75--minutes involved in face-to-face and non-face-to-face activities for this patient on the day of the visit. Professional  time spent includes the following activities: Preparing to see the patient (review of tests), Obtaining and/or reviewing separately obtained history (admission/discharge record), Performing a medically appropriate examination and/or evaluation , Ordering medications/tests/procedures, referring and communicating with other health care professionals, Documenting clinical information in the EMR, Independently interpreting results (not separately reported), Communicating results to the patient/family/caregiver, Counseling and educating the patient/family/caregiver and Care coordination (not separately reported).   This consult involved complex antimicrobial management  ________________________________________________ Discussed with  wife and requesting provider

## 2023-07-11 NOTE — Progress Notes (Signed)
 PROGRESS NOTE    Jeff Key  WJX:[id number] DOB: [birth year] DOA: 07/05/2023 PCP: Antonio Baumgarten, MD  Chief Complaint  Patient presents with   Emesis    Hospital Course:  Jeff Key is 78 y.o. male with advanced dementia, nonverbal at baseline, CAD, ischemic cardiomyopathy, chronic heart failure reduced EF, chronic ulcerative proctosigmoiditis, insulin -dependent diabetes, obesity, who was brought in by family member due to worsening GI symptoms with nausea, vomiting, fever, abdominal pain.  Workup in ED revealed stable vital signs, increased lactic acidosis of 3.5, creatinine 1.7, leukocytosis 27, CT abdomen pelvis with signs of acute cholecystitis.  Patient was started on broad-spectrum antibiotics and given a 2 L IV bolus.  General surgery was consulted and recommended cholecystotomy tube given patient has been on antiplatelet therapy.  Stay has been been prolonged due to persistent leukocytosis, urinary retention, and constipation.  Subjective: No events overnight, tolerating diet  Objective: Vitals:   07/10/23 2015 07/11/23 0341 07/11/23 0743 07/11/23 1140  BP: (!) 139/95 128/80 138/75 115/75  Pulse: 80 82 67 86  Resp: (!) 22 18 19 19   Temp: 98.2 F (36.8 C) 98.2 F (36.8 C) 97.9 F (36.6 C) 98 F (36.7 C)  TempSrc: Oral     SpO2: 95% 97% 98% 93%  Weight:      Height:        Intake/Output Summary (Last 24 hours) at 07/11/2023 1317 Last data filed at 07/11/2023 1041 Gross per 24 hour  Intake 255 ml  Output 1170 ml  Net -915 ml   Filed Weights   07/05/23 0124 07/05/23 1452  Weight: 108.9 kg 108.9 kg    Examination: General exam: Appears calm, pleasantly confused Respiratory system: No work of breathing, symmetric chest wall expansion Cardiovascular system: S1 & S2 heard, RRR.  Gastrointestinal system: Abdomen is distended but soft, drain in place dressing c/d/i Neuro: moving all 4 Skin: No rashes, lesions Psychiatry: calm, confused  Assessment &  Plan:  Principal Problem:   Severe sepsis (HCC) Active Problems:   Sepsis (HCC)   Acute cholecystitis    Sepsis with acute endorgan damage Acute cholecystitis S/p cholecystostomy drain, has been on zosyn  since 4/17, repeat ct on 4/22 shows stable small pelvic abscesses, ir and gen surg have evaluated, ir says should resolve with abx and are too small for intervention. Aspirate growing e coli. ID consulted today for antibiotic planning. Wife to get drain teaching today  Constipation - 1 small bowel movement 4/21 - continue bowel regimen  AKI - resolved  Urinary retention - acute, foley placed 4/22 - on flomax  now - wife wishes to avoid home with foley if possible - will attempt tov today  Hypokalemia - Replace as needed  History of CAD Ischemic cardiomyopathy Chronic heart failure reduced EF - Clinically euvolemic at this time - Echo: LVEF 55 to 60%, grade 1 diastolic dysfunction.  Dilation of aortic root, - Status post 3 L IV fluids in the ED. - GDMT as BP tolerates - Troponin slightly elevated, patient does not appear to have chest pain.  Echo as above  Hypertension stable  Insulin -dependent diabetes - Sliding scale insulin  with basal/bolus titration as needed-hemoglobin A1c 7.2%  Obesity BMI 34 - Complicates the above care plan given necessity of surgical intervention - Outpatient follow up for lifestyle modification and risk factor management  Advanced dementia - Family reports current mentation is at baseline.  Patient is nonverbal. - Continue memantadine  DVT prophylaxis: lovenox    Code Status: Full Code Disposition: inpt.  Likely d/c home tomorrow  Consultants:  Gen surg, IR, ID  Procedures:  Cystostomy tube 4/17  Antimicrobials:  Anti-infectives (From admission, onward)    Start     Dose/Rate Route Frequency Ordered Stop   07/05/23 1130  cefOXitin  (MEFOXIN ) 2 g in sodium chloride  0.9 % 100 mL IVPB        2 g 200 mL/hr over 30 Minutes  Intravenous To Radiology 07/05/23 1118 07/05/23 1659   07/05/23 0900  piperacillin -tazobactam (ZOSYN ) IVPB 3.375 g        3.375 g 12.5 mL/hr over 240 Minutes Intravenous Every 8 hours 07/05/23 0805     07/05/23 0230  ceFEPIme  (MAXIPIME ) 2 g in sodium chloride  0.9 % 100 mL IVPB        2 g 200 mL/hr over 30 Minutes Intravenous  Once 07/05/23 0219 07/05/23 0303   07/05/23 0230  metroNIDAZOLE  (FLAGYL ) IVPB 500 mg        500 mg 100 mL/hr over 60 Minutes Intravenous  Once 07/05/23 0219 07/05/23 0347   07/05/23 0230  vancomycin  (VANCOREADY) IVPB 2000 mg/400 mL        2,000 mg 200 mL/hr over 120 Minutes Intravenous  Once 07/05/23 0227 07/05/23 0616       Data Reviewed: I have personally reviewed following labs and imaging studies CBC: Recent Labs  Lab 07/07/23 0523 07/08/23 0533 07/09/23 0509 07/10/23 0525 07/11/23 0517  WBC 17.5* 16.2* 15.5* 17.0* 16.1*  NEUTROABS 13.3* 11.4* 10.5* 12.0* 11.7*  HGB 10.0* 10.4* 10.5* 10.5* 10.3*  HCT 30.2* 31.6* 31.9* 32.0* 32.0*  MCV 87.0 89.5 88.1 88.4 90.1  PLT 343 376 419* 435* 428*   Basic Metabolic Panel: Recent Labs  Lab 07/07/23 0523 07/08/23 0533 07/09/23 0509 07/10/23 0525 07/11/23 0517  NA 138 136 135 138 134*  K 3.4* 3.5 3.3* 3.5 3.4*  CL 100 104 101 103 101  CO2 24 26 25 24 24   GLUCOSE 152* 156* 166* 166* 160*  BUN 16 13 14 15 16   CREATININE 0.87 0.90 0.91 0.85 0.88  CALCIUM  8.4* 8.1* 7.9* 8.3* 8.3*  MG 2.1 1.8 1.9 1.9 2.0  PHOS 3.5 3.6 3.6 3.1 3.6   GFR: Estimated Creatinine Clearance: 85.5 mL/min (by C-G formula based on SCr of 0.88 mg/dL). Liver Function Tests: Recent Labs  Lab 07/07/23 0523 07/08/23 0533 07/09/23 0509 07/10/23 0525 07/11/23 0517  AST 33 66* 71* 35 28  ALT 18 32 39 30 25  ALKPHOS 61 77 82 86 76  BILITOT 0.9 0.8 0.5 0.6 0.7  PROT 6.3* 6.6 6.7 7.0 6.9  ALBUMIN 2.3* 2.3* 2.4* 2.6* 2.3*   CBG: Recent Labs  Lab 07/10/23 1305 07/10/23 1609 07/10/23 2221 07/11/23 0823 07/11/23 1141  GLUCAP  170* 159* 162* 173* 173*    Recent Results (from the past 240 hours)  Blood Culture (routine x 2)     Status: None   Collection Time: 07/05/23  1:25 AM   Specimen: BLOOD RIGHT ARM  Result Value Ref Range Status   Specimen Description   Final    BLOOD RIGHT ARM Performed at [facility], [address], [place], [place] [address]    Special Requests   Final    BOTTLES DRAWN AEROBIC AND ANAEROBIC Blood Culture adequate volume   Culture   Final    NO GROWTH 5 DAYS Performed at [facility], [address], [place], [place] [address]    Report Status 07/10/2023 FINAL  Final  Blood Culture (routine x  2)     Status: None   Collection Time: 07/05/23  1:25 AM   Specimen: BLOOD  Result Value Ref Range Status   Specimen Description BLOOD LAC  Final   Special Requests   Final    BOTTLES DRAWN AEROBIC AND ANAEROBIC Blood Culture adequate volume   Culture   Final    NO GROWTH 5 DAYS Performed at [facility], [address] [place], [place] [address]    Report Status 07/10/2023 FINAL  Final  Resp panel by RT-PCR (RSV, Flu A&B, Covid) Anterior Nasal Swab     Status: None   Collection Time: 07/05/23  1:48 AM   Specimen: Anterior Nasal Swab  Result Value Ref Range Status   SARS Coronavirus 2 by RT PCR NEGATIVE NEGATIVE Final    Comment: (NOTE) SARS-CoV-2 target nucleic acids are NOT DETECTED.  The SARS-CoV-2 RNA is generally detectable in upper respiratory specimens during the acute phase of infection. The lowest concentration of SARS-CoV-2 viral copies this assay can detect is 138 copies/mL. A negative result does not preclude SARS-Cov-2 infection and should not be used as the sole basis for treatment or other patient management decisions. A negative result may occur with  improper specimen collection/handling, submission of specimen other than nasopharyngeal swab, presence of viral mutation(s) within the areas targeted by this assay, and inadequate number  of viral copies(<138 copies/mL). A negative result must be combined with clinical observations, patient history, and epidemiological information. The expected result is Negative.  Fact Sheet for Patients:  BloggerCourse.com  Fact Sheet for Healthcare Providers:  SeriousBroker.it  This test is no t yet approved or cleared by the [place]  FDA and  has been authorized for detection and/or diagnosis of SARS-CoV-2 by FDA under an Emergency Use Authorization (EUA). This EUA will remain  in effect (meaning this test can be used) for the duration of the COVID-19 declaration under Section 564(b)(1) of the Act, 21 U.S.C.section 360bbb-3(b)(1), unless the authorization is terminated  or revoked sooner.       Influenza A by PCR NEGATIVE NEGATIVE Final   Influenza B by PCR NEGATIVE NEGATIVE Final    Comment: (NOTE) The Xpert Xpress SARS-CoV-2/FLU/RSV plus assay is intended as an aid in the diagnosis of influenza from Nasopharyngeal swab specimens and should not be used as a sole basis for treatment. Nasal washings and aspirates are unacceptable for Xpert Xpress SARS-CoV-2/FLU/RSV testing.  Fact Sheet for Patients: BloggerCourse.com  Fact Sheet for Healthcare Providers: SeriousBroker.it  This test is not yet approved or cleared by the [place]  FDA and has been authorized for detection and/or diagnosis of SARS-CoV-2 by FDA under an Emergency Use Authorization (EUA). This EUA will remain in effect (meaning this test can be used) for the duration of the COVID-19 declaration under Section 564(b)(1) of the Act, 21 U.S.C. section 360bbb-3(b)(1), unless the authorization is terminated or revoked.     Resp Syncytial Virus by PCR NEGATIVE NEGATIVE Final    Comment: (NOTE) Fact Sheet for Patients: BloggerCourse.com  Fact Sheet for Healthcare  Providers: SeriousBroker.it  This test is not yet approved or cleared by the [place]  FDA and has been authorized for detection and/or diagnosis of SARS-CoV-2 by FDA under an Emergency Use Authorization (EUA). This EUA will remain in effect (meaning this test can be used) for the duration of the COVID-19 declaration under Section 564(b)(1) of the Act, 21 U.S.C. section 360bbb-3(b)(1), unless the authorization is terminated or revoked.  Performed at [facility], [address] [place], [place] [address]   Urine Culture     Status: None   Collection Time: 07/05/23  3:08 AM   Specimen: Urine, Random  Result Value Ref Range Status   Specimen Description   Final    URINE, RANDOM Performed at [facility], [address], [place], [place] [address]    Special Requests   Final    NONE Reflexed from [id number] Performed at [facility], [address], [place], [place] [address]    Culture   Final    NO GROWTH Performed at [facility], [address], [place], [place] [address]    Report Status 07/06/2023 FINAL  Final  Aerobic/Anaerobic Culture w Gram Stain (surgical/deep wound)     Status: None   Collection Time: 07/05/23  4:26 PM   Specimen: Abscess  Result Value Ref Range Status   Specimen Description   Final    ABSCESS Performed at [facility], [address], [place], [place] [address]    Special Requests   Final     GALLBLADDER Performed at [facility], [address] [place], [place] [address]    Gram Stain   Final    MODERATE WBC PRESENT,BOTH PMN AND MONONUCLEAR FEW GRAM NEGATIVE RODS    Culture   Final    ABUNDANT ESCHERICHIA COLI NO ANAEROBES ISOLATED Performed at [facility], [address] [place], [place] [address]    Report Status 07/10/2023 FINAL  Final   Organism ID, Bacteria ESCHERICHIA COLI  Final      Susceptibility   Escherichia coli - MIC*    AMPICILLIN   >=32 RESISTANT Resistant     CEFEPIME  <=0.12 SENSITIVE Sensitive     CEFTAZIDIME <=1 SENSITIVE Sensitive     CEFTRIAXONE  <=0.25 SENSITIVE Sensitive     CIPROFLOXACIN  >=4 RESISTANT Resistant     GENTAMICIN <=1 SENSITIVE Sensitive     IMIPENEM <=0.25 SENSITIVE Sensitive     TRIMETH/SULFA >=320 RESISTANT Resistant     AMPICILLIN /SULBACTAM 16 INTERMEDIATE Intermediate     PIP/TAZO <=4 SENSITIVE Sensitive ug/mL    * ABUNDANT ESCHERICHIA COLI     Radiology Studies: CT ABDOMEN PELVIS W CONTRAST Result Date: 07/10/2023 CLINICAL DATA:  Percutaneous chole tube. Persistent leukocytosis. Concern for abscess and worsening infection. Emesis. Advanced dementia. EXAM: CT ABDOMEN AND PELVIS WITH CONTRAST TECHNIQUE: Multidetector CT imaging of the abdomen and pelvis was performed using the standard protocol following bolus administration of intravenous contrast. RADIATION DOSE REDUCTION: This exam was performed according to the departmental dose-optimization program which includes automated exposure control, adjustment of the mA and/or kV according to patient size and/or use of iterative reconstruction technique. CONTRAST:  OMNIPAQUE  IOHEXOL  300 MG/ML  SOLN COMPARISON:  07/05/2023 FINDINGS: Lower chest: Bronchial wall thickening and peribronchovascular infiltrates in the lower lobes. Small bilateral pleural effusions. Hepatobiliary: Hepatic steatosis. Percutaneous cholecystostomy tube in the decompressed gallbladder. Gallbladder wall thickening and hyperenhancement with trace adjacent stranding may be due to ongoing infection/inflammation or decompression. No biliary dilation or radiopaque stone. Pancreas: Unremarkable. Spleen: Unremarkable. Adrenals/Urinary Tract: Normal adrenal glands. No urinary calculi or hydronephrosis. Decompressed bladder about a Foley catheter. Adjacent perivesical fat stranding. Stomach/Bowel: Normal caliber large and small bowel without bowel wall thickening. Stomach and appendix are  within normal limits. Vascular/Lymphatic: Aortic atherosclerosis. No enlarged abdominal or pelvic lymph nodes. Reproductive: No acute abnormality. Other: Peripherally enhancing fluid collection in the pelvis measuring 5.4 x 1.2 cm communicating with a second peripherally enhancing fluid collection  measuring 3.6 x 1.5 cm (series 2/image 89 and 87). Adjacent inflammatory fat stranding. No free intraperitoneal air. Musculoskeletal: No acute fracture. IMPRESSION: 1. Peripherally enhancing fluid collections in the pelvis measuring 5.4 x 1.2 cm and 3.6 x 1.5 cm, suspicious for abscesses. 2. Percutaneous cholecystostomy tube in the decompressed gallbladder. Gallbladder wall thickening and hyperenhancement with trace adjacent stranding may be due to ongoing infection/inflammation or decompression. 3. Foley catheter in the decompressed bladder. Perivesical stranding and bladder wall thickening. Correlate with urinalysis for cystitis. 4. Bronchial wall thickening and peribronchovascular infiltrates in the lower lobes, suspicious for aspiration/bronchopneumonia. Small bilateral pleural effusions. 5. Hepatic steatosis. 6. Aortic Atherosclerosis (ICD10-I70.0). Electronically Signed   By: Rozell Cornet M.D.   On: 07/10/2023 17:43     Scheduled Meds:  aspirin  EC  81 mg Oral Daily   atorvastatin   20 mg Oral Daily   Chlorhexidine  Gluconate Cloth  6 each Topical Daily   enoxaparin  (LOVENOX ) injection  50 mg Subcutaneous Q24H   escitalopram   5 mg Oral Daily   feeding supplement  237 mL Oral BID BM   galantamine   8 mg Oral BID WC   insulin  aspart  0-20 Units Subcutaneous TID WC   lactulose   20 g Oral TID   memantine   10 mg Oral BID   ondansetron  (ZOFRAN ) IV  4 mg Intravenous Once   sodium chloride  flush  5 mL Intracatheter Q8H   tamsulosin   0.8 mg Oral Daily   Continuous Infusions:  piperacillin -tazobactam (ZOSYN )  IV 3.375 g (07/11/23 0941)     LOS: 6 days   Raymonde Calico, MD [facility]

## 2023-07-11 NOTE — Progress Notes (Signed)
 Subjective:  CC: Jeff Key is a 78 y.o. male  Hospital stay day 6,   acute cholecystitis  HPI: No acute issues reported by wife overnight.  Surgery contacted for persistent leukocytosis with repeat CT findings as noted below  ROS:  Pertinent positives and negatives noted in the HPI.    Objective:   Temp:  [97.9 F (36.6 C)-98.6 F (37 C)] 97.9 F (36.6 C) (04/23 0743) Pulse Rate:  [67-84] 67 (04/23 0743) Resp:  [16-22] 19 (04/23 0743) BP: (119-139)/(54-95) 138/75 (04/23 0743) SpO2:  [94 %-98 %] 98 % (04/23 0743)     Height: 5\' 10"  (177.8 cm) Weight: 108.9 kg BMI (Calculated): 34.45   Intake/Output this shift:   Intake/Output Summary (Last 24 hours) at 07/11/2023 1610 Last data filed at 07/11/2023 0526 Gross per 24 hour  Intake 15 ml  Output 1495 ml  Net -1480 ml    Constitutional :  no distress  Respiratory:  Clear to auscultation bilaterally  Cardiovascular:  Regular rate and rhythm  Gastrointestinal: Soft, no guarding, no tenderness to palpation, stable distention gallbladder drain with serosanguineous fluid .   Skin: Cool and moist.   Psychiatric: Normal affect, non-agitated, not confused       LABS:     Latest Ref Rng & Units 07/11/2023    5:17 AM 07/10/2023    5:25 AM 07/09/2023    5:09 AM  CMP  Glucose 70 - 99 mg/dL 960  454  098   BUN 8 - 23 mg/dL 16  15  14    Creatinine 0.61 - 1.24 mg/dL 1.19  1.47  8.29   Sodium 135 - 145 mmol/L 134  138  135   Potassium 3.5 - 5.1 mmol/L 3.4  3.5  3.3   Chloride 98 - 111 mmol/L 101  103  101   CO2 22 - 32 mmol/L 24  24  25    Calcium  8.9 - 10.3 mg/dL 8.3  8.3  7.9   Total Protein 6.5 - 8.1 g/dL 6.9  7.0  6.7   Total Bilirubin 0.0 - 1.2 mg/dL 0.7  0.6  0.5   Alkaline Phos 38 - 126 U/L 76  86  82   AST 15 - 41 U/L 28  35  71   ALT 0 - 44 U/L 25  30  39       Latest Ref Rng & Units 07/11/2023    5:17 AM 07/10/2023    5:25 AM 07/09/2023    5:09 AM  CBC  WBC 4.0 - 10.5 K/uL 16.1  17.0  15.5   Hemoglobin 13.0 -  17.0 g/dL 56.2  13.0  86.5   Hematocrit 39.0 - 52.0 % 32.0  32.0  31.9   Platelets 150 - 400 K/uL 428  435  419     RADS: CLINICAL DATA:  Percutaneous chole tube. Persistent leukocytosis. Concern for abscess and worsening infection. Emesis. Advanced dementia.   EXAM: CT ABDOMEN AND PELVIS WITH CONTRAST   TECHNIQUE: Multidetector CT imaging of the abdomen and pelvis was performed using the standard protocol following bolus administration of intravenous contrast.   RADIATION DOSE REDUCTION: This exam was performed according to the departmental dose-optimization program which includes automated exposure control, adjustment of the mA and/or kV according to patient size and/or use of iterative reconstruction technique.   CONTRAST:  OMNIPAQUE  IOHEXOL  300 MG/ML  SOLN   COMPARISON:  07/05/2023   FINDINGS: Lower chest: Bronchial wall thickening and peribronchovascular infiltrates in the lower lobes.  Small bilateral pleural effusions.   Hepatobiliary: Hepatic steatosis. Percutaneous cholecystostomy tube in the decompressed gallbladder. Gallbladder wall thickening and hyperenhancement with trace adjacent stranding may be due to ongoing infection/inflammation or decompression. No biliary dilation or radiopaque stone.   Pancreas: Unremarkable.   Spleen: Unremarkable.   Adrenals/Urinary Tract: Normal adrenal glands. No urinary calculi or hydronephrosis. Decompressed bladder about a Foley catheter. Adjacent perivesical fat stranding.   Stomach/Bowel: Normal caliber large and small bowel without bowel wall thickening. Stomach and appendix are within normal limits.   Vascular/Lymphatic: Aortic atherosclerosis. No enlarged abdominal or pelvic lymph nodes.   Reproductive: No acute abnormality.   Other: Peripherally enhancing fluid collection in the pelvis measuring 5.4 x 1.2 cm communicating with a second peripherally enhancing fluid collection measuring 3.6 x 1.5 cm (series  2/image 89 and 87). Adjacent inflammatory fat stranding. No free intraperitoneal air.   Musculoskeletal: No acute fracture.   IMPRESSION: 1. Peripherally enhancing fluid collections in the pelvis measuring 5.4 x 1.2 cm and 3.6 x 1.5 cm, suspicious for abscesses. 2. Percutaneous cholecystostomy tube in the decompressed gallbladder. Gallbladder wall thickening and hyperenhancement with trace adjacent stranding may be due to ongoing infection/inflammation or decompression. 3. Foley catheter in the decompressed bladder. Perivesical stranding and bladder wall thickening. Correlate with urinalysis for cystitis. 4. Bronchial wall thickening and peribronchovascular infiltrates in the lower lobes, suspicious for aspiration/bronchopneumonia. Small bilateral pleural effusions. 5. Hepatic steatosis. 6. Aortic Atherosclerosis (ICD10-I70.0).     Electronically Signed   By: Rozell Cornet M.D.   On: 07/10/2023 17:43   Assessment:   Acute cholecystitis, status post IR guided cholecystectomy due to anticoagulation and multiple comorbidities.  Persistent leukocytosis on labs despite antibiotic treatment and cholecystostomy tube placement.  Patient has had issues with urinary retention as well since last seen by surgery.  Repeat CT scan as noted above.  Fluid collections are very small but we will ask IR to reeval just in case they are able to potentially aspirate.  Pelvic fluid could be potential source of leukocytosis, but patient is also noted to have some respiratory findings on CT as noted above. Can consider adjusting antibiotics to cover respiratory flora as well.  Sensitivities shows gallbladder source is controlled with Zosyn  and no sign of dysfunction with the current cholecystostomy tube.    Surgical intervention not warranted at this time.  Surgery will sign off again.  Please call with any additional questions or concerns   labs/images/medications/previous chart entries reviewed  personally and relevant changes/updates noted above.

## 2023-07-11 NOTE — Plan of Care (Signed)

## 2023-07-12 ENCOUNTER — Other Ambulatory Visit: Payer: Self-pay

## 2023-07-12 DIAGNOSIS — K651 Peritoneal abscess: Secondary | ICD-10-CM

## 2023-07-12 DIAGNOSIS — R6520 Severe sepsis without septic shock: Secondary | ICD-10-CM | POA: Diagnosis not present

## 2023-07-12 DIAGNOSIS — A419 Sepsis, unspecified organism: Secondary | ICD-10-CM | POA: Diagnosis not present

## 2023-07-12 DIAGNOSIS — K5229 Other allergic and dietetic gastroenteritis and colitis: Secondary | ICD-10-CM

## 2023-07-12 LAB — CBC WITH DIFFERENTIAL/PLATELET
Abs Immature Granulocytes: 0.81 10*3/uL — ABNORMAL HIGH (ref 0.00–0.07)
Basophils Absolute: 0.1 10*3/uL (ref 0.0–0.1)
Basophils Relative: 1 %
Eosinophils Absolute: 0.8 10*3/uL — ABNORMAL HIGH (ref 0.0–0.5)
Eosinophils Relative: 6 %
HCT: 32.8 % — ABNORMAL LOW (ref 39.0–52.0)
Hemoglobin: 10.6 g/dL — ABNORMAL LOW (ref 13.0–17.0)
Immature Granulocytes: 6 %
Lymphocytes Relative: 12 %
Lymphs Abs: 1.7 10*3/uL (ref 0.7–4.0)
MCH: 29 pg (ref 26.0–34.0)
MCHC: 32.3 g/dL (ref 30.0–36.0)
MCV: 89.6 fL (ref 80.0–100.0)
Monocytes Absolute: 0.8 10*3/uL (ref 0.1–1.0)
Monocytes Relative: 6 %
Neutro Abs: 10.2 10*3/uL — ABNORMAL HIGH (ref 1.7–7.7)
Neutrophils Relative %: 69 %
Platelets: 425 10*3/uL — ABNORMAL HIGH (ref 150–400)
RBC: 3.66 MIL/uL — ABNORMAL LOW (ref 4.22–5.81)
RDW: 13.6 % (ref 11.5–15.5)
Smear Review: NORMAL
WBC: 14.3 10*3/uL — ABNORMAL HIGH (ref 4.0–10.5)
nRBC: 0 % (ref 0.0–0.2)

## 2023-07-12 LAB — AEROBIC/ANAEROBIC CULTURE W GRAM STAIN (SURGICAL/DEEP WOUND)

## 2023-07-12 LAB — GLUCOSE, CAPILLARY
Glucose-Capillary: 153 mg/dL — ABNORMAL HIGH (ref 70–99)
Glucose-Capillary: 179 mg/dL — ABNORMAL HIGH (ref 70–99)

## 2023-07-12 MED ORDER — METRONIDAZOLE 500 MG PO TABS: 500.0000 mg | ORAL_TABLET | Freq: Two times a day (BID) | ORAL | 0 refills | Status: AC

## 2023-07-12 MED ORDER — TAMSULOSIN HCL 0.4 MG PO CAPS: 0.4000 mg | ORAL_CAPSULE | Freq: Every day | ORAL | 1 refills | Status: DC

## 2023-07-12 MED ORDER — CEFTRIAXONE IV (FOR PTA / DISCHARGE USE ONLY): 2.0000 g | INTRAVENOUS | 0 refills | Status: AC

## 2023-07-12 MED ORDER — METRONIDAZOLE 500 MG PO TABS
500.0000 mg | ORAL_TABLET | Freq: Two times a day (BID) | ORAL | Status: DC
  Administered 2023-07-12: 500 mg via ORAL
  Filled 2023-07-12 (×2): qty 1

## 2023-07-12 MED ORDER — SODIUM CHLORIDE FLUSH 0.9 % IV SOLN
5.0000 mL | Freq: Every day | INTRAVENOUS | 0 refills | Status: DC
  Filled 2023-07-12: qty 300, 60d supply, fill #0

## 2023-07-12 MED ORDER — SODIUM CHLORIDE 0.9 % IV SOLN
2.0000 g | INTRAVENOUS | Status: DC
  Administered 2023-07-12: 2 g via INTRAVENOUS
  Filled 2023-07-12: qty 20

## 2023-07-12 MED ORDER — SODIUM CHLORIDE 0.9% FLUSH: 10.0000 mL | Freq: Two times a day (BID) | INTRAVENOUS | Status: DC

## 2023-07-12 MED ORDER — SODIUM CHLORIDE 0.9% FLUSH: 10.0000 mL | INTRAVENOUS | Status: DC | PRN

## 2023-07-12 MED ORDER — SODIUM CHLORIDE 0.9% FLUSH: 5.0000 mL | Freq: Three times a day (TID) | INTRAVENOUS | 5 refills | Status: DC

## 2023-07-12 NOTE — Progress Notes (Signed)
 Nsg Discharge Note  Admit Date:  07/05/2023 Discharge date: 07/12/2023   PISTOL KESSENICH to be D/C'd Home per MD order.  AVS completed.  Copy for chart, and copy for patient signed, and dated. Patient/caregiver able to verbalize understanding.     Discharge Assessment: Vitals:   07/12/23 0316 07/12/23 0820  BP: 121/71 123/66  Pulse: 73 64  Resp: 20 18  Temp: 97.7 F (36.5 C) 98.2 F (36.8 C)  SpO2: 95% 96%   Skin clean, dry and intact without evidence of skin break down, no evidence of skin tears noted. IV catheter discontinued intact. Site without signs and symptoms of complications - no redness or edema noted at insertion site, patient denies c/o pain - only slight tenderness at site.  Dressing with slight pressure applied.  D/c Instructions-Education: Discharge instructions given to patient/family with verbalized understanding. D/c education completed with patient/family including follow up instructions, medication list, d/c activities limitations if indicated, with other d/c instructions as indicated by MD - patient able to verbalize understanding, all questions fully answered. Patient instructed to return to ED, call 911, or call MD for any changes in condition.  Patient transported via EMS. Wife at bedside aware to pick up meds and HH is arranged.   Tamara Fairy, RN 07/12/2023 5:48 PM

## 2023-07-12 NOTE — Discharge Instructions (Addendum)
 Patient Line: [phone] This number will reach Interventional Radiology Dept (placed your drain) for concerns. Your drain was flushed three times per day while in hospital. Flush it as needed, up to once per day using clean technique at home. Flush if output appears noticeably decreased. You will be contacted for 2 mo follow up appt with clinic for injection of drain/exchange.

## 2023-07-12 NOTE — Progress Notes (Signed)
 Date of Admission:  07/05/2023     ID: Jeff Key is a 78 y.o. male  Principal Problem:   Severe sepsis (HCC) Active Problems:   CAD (coronary artery disease)   Sepsis (HCC)   Alzheimer's dementia without behavioral disturbance (HCC)   Acute cholecystitis    Subjective: Non verbal Foley was removed yesterday He is passing urine  Medications:   aspirin  EC  81 mg Oral Daily   atorvastatin   20 mg Oral Daily   Chlorhexidine  Gluconate Cloth  6 each Topical Daily   enoxaparin  (LOVENOX ) injection  50 mg Subcutaneous Q24H   escitalopram   5 mg Oral Daily   feeding supplement  237 mL Oral BID BM   galantamine   8 mg Oral BID WC   insulin  aspart  0-20 Units Subcutaneous TID WC   lactulose   20 g Oral TID   memantine   10 mg Oral BID   metroNIDAZOLE   500 mg Oral Q12H   ondansetron  (ZOFRAN ) IV  4 mg Intravenous Once   sodium chloride  flush  10-40 mL Intracatheter Q12H   sodium chloride  flush  5 mL Intracatheter Q8H   tamsulosin   0.8 mg Oral Daily    Objective: Vital signs in last 24 hours: Patient Vitals for the past 24 hrs:  BP Temp Pulse Resp SpO2  07/12/23 0820 123/66 98.2 F (36.8 C) 64 18 96 %  07/12/23 0316 121/71 97.7 F (36.5 C) 73 20 95 %  07/11/23 2319 127/87 (!) 95.5 F (35.3 C) 86 18 100 %  07/11/23 2040 128/84 99.3 F (37.4 C) 93 18 98 %     LDA Rt midline  PHYSICAL EXAM:  General: awake, non verbal Lungs: Clear to auscultation bilaterally. No Wheezing or Rhonchi. No rales. Heart: Regular rate and rhythm, no murmur, rub or gallop. Abdomen: Soft, non-tender,not distended. Bowel sounds normal. No masses Extremities: atraumatic, no cyanosis. No edema. No clubbing Skin: No rashes or lesions. Or bruising Lymph: Cervical, supraclavicular normal. Neurologic: cannot be assessed  Lab Results    Latest Ref Rng & Units 07/12/2023    9:06 AM 07/11/2023    5:17 AM 07/10/2023    5:25 AM  CBC  WBC 4.0 - 10.5 K/uL 14.3  16.1  17.0   Hemoglobin 13.0 - 17.0  g/dL 09.8  11.9  14.7   Hematocrit 39.0 - 52.0 % 32.8  32.0  32.0   Platelets 150 - 400 K/uL 425  428  435        Latest Ref Rng & Units 07/11/2023    5:17 AM 07/10/2023    5:25 AM 07/09/2023    5:09 AM  CMP  Glucose 70 - 99 mg/dL 829  562  130   BUN 8 - 23 mg/dL 16  15  14    Creatinine 0.61 - 1.24 mg/dL 8.65  7.84  6.96   Sodium 135 - 145 mmol/L 134  138  135   Potassium 3.5 - 5.1 mmol/L 3.4  3.5  3.3   Chloride 98 - 111 mmol/L 101  103  101   CO2 22 - 32 mmol/L 24  24  25    Calcium  8.9 - 10.3 mg/dL 8.3  8.3  7.9   Total Protein 6.5 - 8.1 g/dL 6.9  7.0  6.7   Total Bilirubin 0.0 - 1.2 mg/dL 0.7  0.6  0.5   Alkaline Phos 38 - 126 U/L 76  86  82   AST 15 - 41 U/L 28  35  71  ALT 0 - 44 U/L 25  30  39       Microbiology:  Studies/Results: No results found.   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 Discussed the management with the wife- OPAT entered

## 2023-07-12 NOTE — Plan of Care (Signed)
  Problem: Education: Goal: Ability to describe self-care measures that may prevent or decrease complications (Diabetes Survival Skills Education) will improve Outcome: Progressing   Problem: Education: Goal: Individualized Educational Video(s) Outcome: Progressing   Problem: Coping: Goal: Ability to adjust to condition or change in health will improve Outcome: Progressing   Problem: Fluid Volume: Goal: Ability to maintain a balanced intake and output will improve Outcome: Progressing   Problem: Health Behavior/Discharge Planning: Goal: Ability to identify and utilize available resources and services will improve Outcome: Progressing   Problem: Metabolic: Goal: Ability to maintain appropriate glucose levels will improve Outcome: Progressing   Problem: Nutritional: Goal: Maintenance of adequate nutrition will improve Outcome: Progressing   Problem: Nutritional: Goal: Progress toward achieving an optimal weight will improve Outcome: Progressing

## 2023-07-12 NOTE — Progress Notes (Signed)
 Spoke with wife re midline placement.  Wife states plan for dc home today but has also been told tomorrow.  Elizabeth RN aware to notify via securechat once dc orders have been entered and will come to place midline.

## 2023-07-12 NOTE — Progress Notes (Signed)
 PHARMACY CONSULT NOTE FOR:  OUTPATIENT  PARENTERAL ANTIBIOTIC THERAPY (OPAT)  Indication: Pelvic abscess Regimen: Ceftriaxone  2gm IV q24h Plus oral metronidazole  500mg  PO BID End date: 07/23/2023  - Remove PICC/midline at completion of antibiotic - Call [phone] with any questions  - Labs - Once weekly:  CBC/D and CMP  - Fax weekly lab results  promptly to [phone]   IV antibiotic discharge orders are pended. To discharging provider:  please sign these orders via discharge navigator,  Select New Orders & click on the button choice - Manage This Unsigned Work.     Thank you for allowing pharmacy to be a part of this patient's care.  Sven Pinheiro, PharmD, BCPS, BCIDP Work Cell: [phone] 07/12/2023 11:59 AM

## 2023-07-12 NOTE — Treatment Plan (Signed)
 Diagnosis: Acute cholecystis has perc cholecystostomy Pelvic abscess Baseline Creatinine  < 1  Culture Result: ecoli  No Known Allergies  OPAT Orders Discharge antibiotics: Ceftriaxone  2 grams Iv every 24 hours And Flagyl  500mg  PO BID Duration: 2 weeks from diagnosis of pelvic abscess End Date: 07/23/23  [facility] Care Per Protocol:  Labs weekly while on IV antibiotics: _X_ CBC with differential  _X_ CMP   _X_ Please pull PIC at completion of IV antibiotics  Fax weekly lab results  promptly to [phone]  Clinic Follow Up Appt: 07/24/23 9am my chart video visit  Call [phone] any questions

## 2023-07-12 NOTE — TOC Progression Note (Signed)
 Transition of Care [facility]) - Progression Note    Patient Details  Name: Jeff Key MRN: [id number] Date of Birth: Feb 10, 1946  [facility] CM/SW Contact  Baird Bombard, RN Phone Number: 07/12/2023, 12:06 PM  Clinical Narrative:    Patient will require home IV abx infusions. Spoke with Pam from [facility]. She previously spoke with [facility] pharmacy and was advised of likely discharge home today. RNCM will arranged [facility]. Pam will arrange teaching with patient's family today.   Referral for [facility] sent to [facility] from [facility] for RN.          Expected Discharge Plan and Services                                               Social Determinants of Health (SDOH) Interventions SDOH Screenings   Food Insecurity: No Food Insecurity (07/07/2023)  Housing: Low Risk  (07/07/2023)  Transportation Needs: Patient Unable To Answer (07/07/2023)  Utilities: Patient Unable To Answer (07/07/2023)  Financial Resource Strain: Low Risk  (07/13/2022)   Received from [facility], [facility]  Social Connections: Unknown (07/07/2023)  Tobacco Use: Medium Risk (07/05/2023)    Readmission Risk Interventions     No data to display

## 2023-07-12 NOTE — Discharge Summary (Signed)
 Jeff Key HYQ:[id number] DOB: 1946/01/23 DOA: 07/05/2023  PCP: Antonio Baumgarten, MD  Admit date: 07/05/2023 Discharge date: 07/12/2023  Time spent: 35 minutes  Recommendations for Outpatient Follow-up:  IR f/u in about 2 months Pcp f/u ID f/u 5/6     Discharge Diagnoses:  Principal Problem:   Severe sepsis (HCC) Active Problems:   CAD (coronary artery disease)   Sepsis (HCC)   Alzheimer's dementia without behavioral disturbance (HCC)   Acute cholecystitis   Discharge Condition: stable  Diet recommendation: regular  Filed Weights   07/05/23 0124 07/05/23 1452  Weight: 108.9 kg 108.9 kg    History of present illness:  From admission h and p Jeff Key is a 78 y.o. male with medical history significant of advanced dementia nonverbal at baseline, CAD, ischemic cardiomyopathy, chronic HFrEF with LVEF 45-50%, chronic ulcerative proctosigmoiditis, IIDM, obesity brought in by family member for evaluation of worsening GI symptoms including intermittent nausea with vomiting and subsequent fever and chills and abdominal pain.   Patient is nonverbal at baseline and unable to provide any history, history provided by wife at bedside.  Symptoms started 7 days ago, patient started develop intermittent nausea vomiting and epigastric discomfort, " morning after eating" and occasional vomiting of stomach content nonbloody nonbilious no diarrhea.  And increased of cold sweat.  Wife brought patient to ED, workup including a CT abdomen pelvis showed no acute findings and patient was reassured and sent home.  Last few days, symptoms became more constant, and wife decided to bring patient to the hospital this morning.  Hospital Course:   Patient presents with acute cholecystitis/sepsis. Not a good surgical candidate, instead this was treated with IV abx and cholecystostomy drain. Culture growing e coli. Ct shows pelvic abscesses too small to drain, IR says should resolve with abx. No oral  antibiotic option based on sensitivities. Treated with zosyn , transitioned to ceftriaxone  and flagyl . Did develop acute urinary retention treated with foley catheter, this was removed and now voiding spontaneously. We did start flomax . Also with aki that has resolved. Wife trained in drain flushes and drain care.   Opat orders are as follows: Discharge antibiotics: Ceftriaxone  2 grams Iv every 24 hours And Flagyl  500mg  PO BID Duration: 2 weeks from diagnosis of pelvic abscess End Date: 07/23/23   [facility] Care Per Protocol:   Labs weekly while on IV antibiotics: _X_ CBC with differential   _X_ CMP     _X_ Please pull PIC at completion of IV antibiotics   Fax weekly lab results  promptly to [phone]   Clinic Follow Up Appt: 07/24/23 9am my chart video visit  Procedures: IR cholecystostomy placement   Consultations: IR, gen surg, ID  Discharge Exam: Vitals:   07/12/23 0316 07/12/23 0820  BP: 121/71 123/66  Pulse: 73 64  Resp: 20 18  Temp: 97.7 F (36.5 C) 98.2 F (36.8 C)  SpO2: 95% 96%    General exam: Appears calm, pleasantly confused Respiratory system: No work of breathing, symmetric chest wall expansion Cardiovascular system: S1 & S2 heard, RRR.  Gastrointestinal system: Abdomen is distended but soft, drain in place dressing c/d/i Neuro: moving all 4 Skin: No rashes, lesions Psychiatry: calm, confused  Discharge Instructions   Discharge Instructions     [facility] pharmacist to adjust dose for Vancomycin , Aminoglycosides and other anti-infective therapies as requested by physician.   Complete by: As directed    [facility] to provide Cath Flo 2mg    Complete by: As  directed    Administer for PICC line occlusion and as ordered by physician for other access device issues.   Anaphylaxis Kit: Provided to treat any anaphylactic reaction to the medication being provided to the patient if First Dose or when requested by physician   Complete  by: As directed    Epinephrine 1mg /ml vial / amp: Administer 0.3mg  (0.85ml) subcutaneously once for moderate to severe anaphylaxis, nurse to call physician and pharmacy when reaction occurs and call 911 if needed for immediate care   Diphenhydramine 50mg /ml IV vial: Administer 25-50mg  IV/IM PRN for first dose reaction, rash, itching, mild reaction, nurse to call physician and pharmacy when reaction occurs   Sodium Chloride  0.9% NS 500ml IV: Administer if needed for hypovolemic blood pressure drop or as ordered by physician after call to physician with anaphylactic reaction   Change dressing on IV access line weekly and PRN   Complete by: As directed    Diet - low sodium heart healthy   Complete by: As directed    Flush IV access with Sodium Chloride  0.9% and Heparin  10 units/ml or 100 units/ml   Complete by: As directed    Home infusion instructions - [facility]   Complete by: As directed    Instructions: Flush IV access with Sodium Chloride  0.9% and Heparin  10units/ml or 100units/ml   Change dressing on IV access line: Weekly and PRN   Instructions Cath Flo 2mg : Administer for PICC Line occlusion and as ordered by physician for other access device   [facility] pharmacist to adjust dose for: Vancomycin , Aminoglycosides and other anti-infective therapies as requested by physician   Increase activity slowly   Complete by: As directed    Method of administration may be changed at the discretion of home infusion pharmacist based upon assessment of the patient and/or caregiver's ability to self-administer the medication ordered   Complete by: As directed    No wound care   Complete by: As directed       Allergies as of 07/12/2023   No Known Allergies      Medication List     PAUSE taking these medications    lisinopril  10 MG tablet Wait to take this until your doctor or other care provider tells you to start again. Commonly known as: ZESTRIL  Take 1 tablet (10  mg total) by mouth daily.       STOP taking these medications    metoprolol  tartrate 25 MG tablet Commonly known as: LOPRESSOR    omeprazole 20 MG capsule Commonly known as: PRILOSEC   ondansetron  4 MG tablet Commonly known as: Zofran        TAKE these medications    aspirin  EC 81 MG tablet Take 81 mg by mouth daily.   atorvastatin  20 MG tablet Commonly known as: LIPITOR  Take 1 tablet (20 mg total) by mouth daily.   balsalazide 750 MG capsule Commonly known as: COLAZAL  Take 2,250 mg by mouth 3 (three) times daily.   cefTRIAXone  IVPB Commonly known as: ROCEPHIN  Inject 2 g into the vein daily for 10 days. Indication:  Pelvic abscess First Dose: Yes Last Day of Therapy:  07/23/2023 Labs - Once weekly:  CBC/D and CMP Fax weekly lab results  promptly to [phone] Method of administration: IV Push Method of administration may be changed at the discretion of home infusion pharmacist based upon assessment of the patient and/or caregiver's ability to self-administer the medication ordered. Remove PICC/midline at completion of antibiotic Call [phone] with any questions  Start taking on: July 13, 2023   clopidogrel  75 MG tablet Commonly known as: PLAVIX  Take 1 tablet (75 mg total) by mouth daily.   cyanocobalamin  1000 MCG tablet Commonly known as: VITAMIN B12 Take 1,000 mcg by mouth daily.   escitalopram  5 MG tablet Commonly known as: LEXAPRO  Take 5 mg by mouth daily.   ferrous sulfate  325 (65 FE) MG tablet Take 325 mg by mouth daily with breakfast.   galantamine  12 MG tablet Commonly known as: RAZADYNE  Take 12 mg by mouth 2 (two) times daily with a meal.   memantine  10 MG tablet Commonly known as: NAMENDA  Take 10 mg by mouth 2 (two) times daily.   metFORMIN 500 MG tablet Commonly known as: GLUCOPHAGE Take 500 mg by mouth 2 (two) times daily with a meal.   metroNIDAZOLE  500 MG tablet Commonly known as: FLAGYL  Take 1 tablet (500 mg total) by mouth 2  (two) times daily for 11 days.   nitroGLYCERIN  0.4 MG SL tablet Commonly known as: Nitrostat  Place 1 tablet (0.4 mg total) under the tongue every 5 (five) minutes as needed for chest pain.   sodium chloride  flush 0.9 % Soln injection 5 mLs by Intracatheter route daily.   sodium chloride  flush 0.9 % Soln Commonly known as: NS 5 mLs by Intracatheter route every 8 (eight) hours.   tamsulosin  0.4 MG Caps capsule Commonly known as: FLOMAX  Take 1 capsule (0.4 mg total) by mouth daily. Start taking on: July 13, 2023               Discharge Care Instructions  (From admission, onward)           Start     Ordered   07/12/23 0000  Change dressing on IV access line weekly and PRN  (Home infusion instructions - [facility] )        07/12/23 1526           No Known Allergies  Follow-up Information     [facility] Follow up.   Contact information: [address] [place] [address] [phone]         Antonio Baumgarten, MD Follow up.   Specialty: Internal Medicine Contact information: [address] [place] [address] [phone]                  The results of significant diagnostics from this hospitalization (including imaging, microbiology, ancillary and laboratory) are listed below for reference.    Significant Diagnostic Studies: CT ABDOMEN PELVIS W CONTRAST Result Date: 07/10/2023 CLINICAL DATA:  Percutaneous chole tube. Persistent leukocytosis. Concern for abscess and worsening infection. Emesis. Advanced dementia. EXAM: CT ABDOMEN AND PELVIS WITH CONTRAST TECHNIQUE: Multidetector CT imaging of the abdomen and pelvis was performed using the standard protocol following bolus administration of intravenous contrast. RADIATION DOSE REDUCTION: This exam was performed according to the departmental dose-optimization program which includes automated exposure control, adjustment of the mA and/or kV  according to patient size and/or use of iterative reconstruction technique. CONTRAST:  OMNIPAQUE  IOHEXOL  300 MG/ML  SOLN COMPARISON:  07/05/2023 FINDINGS: Lower chest: Bronchial wall thickening and peribronchovascular infiltrates in the lower lobes. Small bilateral pleural effusions. Hepatobiliary: Hepatic steatosis. Percutaneous cholecystostomy tube in the decompressed gallbladder. Gallbladder wall thickening and hyperenhancement with trace adjacent stranding may be due to ongoing infection/inflammation or decompression. No biliary dilation or radiopaque stone. Pancreas: Unremarkable. Spleen: Unremarkable. Adrenals/Urinary Tract: Normal adrenal glands. No urinary calculi or hydronephrosis. Decompressed bladder about  a Foley catheter. Adjacent perivesical fat stranding. Stomach/Bowel: Normal caliber large and small bowel without bowel wall thickening. Stomach and appendix are within normal limits. Vascular/Lymphatic: Aortic atherosclerosis. No enlarged abdominal or pelvic lymph nodes. Reproductive: No acute abnormality. Other: Peripherally enhancing fluid collection in the pelvis measuring 5.4 x 1.2 cm communicating with a second peripherally enhancing fluid collection measuring 3.6 x 1.5 cm (series 2/image 89 and 87). Adjacent inflammatory fat stranding. No free intraperitoneal air. Musculoskeletal: No acute fracture. IMPRESSION: 1. Peripherally enhancing fluid collections in the pelvis measuring 5.4 x 1.2 cm and 3.6 x 1.5 cm, suspicious for abscesses. 2. Percutaneous cholecystostomy tube in the decompressed gallbladder. Gallbladder wall thickening and hyperenhancement with trace adjacent stranding may be due to ongoing infection/inflammation or decompression. 3. Foley catheter in the decompressed bladder. Perivesical stranding and bladder wall thickening. Correlate with urinalysis for cystitis. 4. Bronchial wall thickening and peribronchovascular infiltrates in the lower lobes, suspicious for  aspiration/bronchopneumonia. Small bilateral pleural effusions. 5. Hepatic steatosis. 6. Aortic Atherosclerosis (ICD10-I70.0). Electronically Signed   By: Rozell Cornet M.D.   On: 07/10/2023 17:43   DG Abd Portable 1V Result Date: 07/06/2023 CLINICAL DATA:  Abdominal distension EXAM: PORTABLE ABDOMEN - 1 VIEW COMPARISON:  CT abdomen and pelvis dated 07/05/2023 FINDINGS: Percutaneous cholecystostomy drain in-situ. Mild gas-filled dilation of right lower quadrant colon. Mild gas-filled dilation of the stomach. Remainder bowel gas appears nonobstructive. No free air or pneumatosis. No abnormal radio-opaque calculi or mass effect. No acute or substantial osseous abnormality. The sacrum and coccyx are partially obscured by overlying bowel contents. IMPRESSION: 1. Mild gas-filled dilation of the stomach and right lower quadrant colon, which may represent ileus. Remainder bowel gas appears nonobstructive. 2. Percutaneous cholecystostomy drain in-situ. Electronically Signed   By: Limin  Xu M.D.   On: 07/06/2023 15:18   DG Chest 1 View Result Date: 07/06/2023 CLINICAL DATA:  78 year old male with congestive heart failure. EXAM: CHEST  1 VIEW COMPARISON:  CT Chest, Abdomen, and Pelvis 0354 hours today. FINDINGS: Portable AP semi upright view at 0612 hours. Stable low lung volumes, cardiomegaly and mediastinal contours. No pneumothorax or pulmonary edema. Left lung subpleural opacity better demonstrated by CT and resembling atelectasis or scarring. Stable ventilation from the CT this morning. Visualized tracheal air column is within normal limits. Stable visualized osseous structures. Stable visible bowel gas pattern. IMPRESSION: Stable low lung volumes with subpleural left lung atelectasis or scarring as per CT this morning. No new cardiopulmonary abnormality. Electronically Signed   By: Marlise Simpers M.D.   On: 07/06/2023 06:44   IR Perc Cholecystostomy Result Date: 07/05/2023 INDICATION: 78 year old with sepsis and  acute cholecystitis. Patient is not a surgical candidate. EXAM: PLACEMENT OF PERCUTANEOUS CHOLECYSTOSTOMY TUBE USING ULTRASOUND AND FLUOROSCOPIC GUIDANCE MEDICATIONS: Fentanyl  100 mcg ANESTHESIA/SEDATION: The patient's level of consciousness and vital signs were monitored continuously by radiology nursing throughout the procedure under my direct supervision. FLUOROSCOPY TIME:  Radiation Exposure Index (as provided by the fluoroscopic device): 28 mGy Kerma COMPLICATIONS: None immediate. PROCEDURE: Patient has severe dementia and cannot communicate. Informed written consent was obtained from the patient's wife after a thorough discussion of the procedural risks, benefits and alternatives. All questions were addressed. Maximal Sterile Barrier Technique was utilized including caps, mask, sterile gowns, sterile gloves, sterile drape, hand hygiene and skin antiseptic. A timeout was performed prior to the initiation of the procedure. Patient was placed supine on the interventional table. The gallbladder was identified with ultrasound. Ultrasound image was saved for documentation. Right upper abdomen was  prepped with chlorhexidine  and sterile field was created. Skin was anesthetized using 1% lidocaine . A small incision was made. Using ultrasound guidance, a 21 gauge needle was directed into the gallbladder via a transhepatic approach. 0.018 wire was advanced into the gallbladder using fluoroscopic guidance. A transitional dilator set was placed and purulent fluid was aspirated. J wire was placed and the tract was dilated to accommodate a 10 [place] multipurpose drain. Approximately 40 mL of purulent fluid was aspirated from the gallbladder. Small amount of contrast was injected to confirm placement as well. Drain was sutured to skin and attached to a gravity bag. Dressing was placed. Fluoroscopic and ultrasound images were taken and saved for documentation. FINDINGS: Mild distention of the gallbladder. Drain was placed using  a transhepatic approach. 40 mL of purulent fluid was removed from the gallbladder. IMPRESSION: Successful percutaneous cholecystostomy tube placement using ultrasound and fluoroscopic guidance. Gallbladder fluid was sent for culture. Electronically Signed   By: Elene Griffes M.D.   On: 07/05/2023 17:43   ECHOCARDIOGRAM COMPLETE Result Date: 07/05/2023    ECHOCARDIOGRAM REPORT   Patient Name:   Jeff Key Date of Exam: 07/05/2023 Medical Rec #:  [id number]        Height:       70.0 in Accession #:    [id number]       Weight:       240.0 lb Date of Birth:  January 23, 1946        BSA:          2.255 m Patient Age:    62 years         BP:           115/79 mmHg Patient Gender: M                HR:           80 bpm. Exam Location:  [facility] Procedure: 2D Echo, 3D Echo, Cardiac Doppler and Color Doppler (Both Spectral            and Color Flow Doppler were utilized during procedure). Indications:     CHF  History:         Patient has prior history of Echocardiogram examinations, most                  recent 05/18/2016. CHF and Cardiomyopathy, CAD and Previous                  Myocardial Infarction, Arrythmias:Tachycardia,                  Signs/Symptoms:Alzheimer's; Risk Factors:Dyslipidemia.  Sonographer:     Clarke Crouch Referring Phys:  [id number] [place] Diagnosing Phys: Belva Boyden MD  Sonographer Comments: Technically difficult study due to poor echo windows, no subcostal window and patient is obese. IMPRESSIONS  1. Left ventricular ejection fraction, by estimation, is 55 to 60%. The left ventricle has normal function. The left ventricle has no regional wall motion abnormalities. Left ventricular diastolic parameters are consistent with Grade I diastolic dysfunction (impaired relaxation).  2. Right ventricular systolic function is normal. The right ventricular size is normal.  3. The mitral valve is normal in structure. Mild mitral valve regurgitation. No evidence of mitral stenosis.  4. The aortic valve is normal  in structure. There is mild calcification of the aortic valve. Aortic valve regurgitation is not visualized. Aortic valve sclerosis is present, with no evidence of aortic valve stenosis.  5. There is mild dilatation  of the aortic root, measuring 40 mm.  6. The inferior vena cava is normal in size with greater than 50% respiratory variability, suggesting right atrial pressure of 3 mmHg. FINDINGS  Left Ventricle: Left ventricular ejection fraction, by estimation, is 55 to 60%. The left ventricle has normal function. The left ventricle has no regional wall motion abnormalities. Strain was performed and the global longitudinal strain is indeterminate. The left ventricular internal cavity size was normal in size. There is no left ventricular hypertrophy. Left ventricular diastolic parameters are consistent with Grade I diastolic dysfunction (impaired relaxation). Right Ventricle: The right ventricular size is normal. No increase in right ventricular wall thickness. Right ventricular systolic function is normal. Left Atrium: Left atrial size was normal in size. Right Atrium: Right atrial size was normal in size. Pericardium: There is no evidence of pericardial effusion. Mitral Valve: The mitral valve is normal in structure. Mild mitral valve regurgitation. No evidence of mitral valve stenosis. MV peak gradient, 8.0 mmHg. The mean mitral valve gradient is 4.0 mmHg. Tricuspid Valve: The tricuspid valve is normal in structure. Tricuspid valve regurgitation is mild . No evidence of tricuspid stenosis. Aortic Valve: The aortic valve is normal in structure. There is mild calcification of the aortic valve. Aortic valve regurgitation is not visualized. Aortic valve sclerosis is present, with no evidence of aortic valve stenosis. Aortic valve mean gradient  measures 10.7 mmHg. Aortic valve peak gradient measures 21.1 mmHg. Aortic valve area, by VTI measures 1.83 cm. Pulmonic Valve: The pulmonic valve was normal in structure.  Pulmonic valve regurgitation is not visualized. No evidence of pulmonic stenosis. Aorta: The aortic root is normal in size and structure. There is mild dilatation of the aortic root, measuring 40 mm. Venous: The inferior vena cava is normal in size with greater than 50% respiratory variability, suggesting right atrial pressure of 3 mmHg. IAS/Shunts: No atrial level shunt detected by color flow Doppler. Additional Comments: 3D was performed not requiring image post processing on an independent workstation and was indeterminate.  LEFT VENTRICLE PLAX 2D LVIDd:         4.90 cm     Diastology LVIDs:         3.60 cm     LV e' medial:    5.98 cm/s LV PW:         1.70 cm     LV E/e' medial:  17.1 LV IVS:        2.00 cm     LV e' lateral:   5.98 cm/s LVOT diam:     2.30 cm     LV E/e' lateral: 17.1 LV SV:         85 LV SV Index:   38 LVOT Area:     4.15 cm  LV Volumes (MOD) LV vol d, MOD A2C: 91.6 ml LV vol d, MOD A4C: 80.3 ml LV vol s, MOD A2C: 47.8 ml LV vol s, MOD A4C: 49.6 ml LV SV MOD A2C:     43.8 ml LV SV MOD A4C:     80.3 ml LV SV MOD BP:      37.7 ml RIGHT VENTRICLE RV Basal diam:  3.65 cm RV Mid diam:    3.50 cm RV S prime:     14.00 cm/s RVOT diam:      4.20 cm TAPSE (M-mode): 2.8 cm LEFT ATRIUM             Index        RIGHT ATRIUM  Index LA Vol (A2C):   29.9 ml 13.26 ml/m  RA Area:     13.30 cm LA Vol (A4C):   38.9 ml 17.25 ml/m  RA Volume:   25.40 ml  11.26 ml/m LA Biplane Vol: 37.0 ml 16.40 ml/m  AORTIC VALVE                     PULMONIC VALVE AV Area (Vmax):    1.83 cm      PV Vmax:       0.90 m/s AV Area (Vmean):   1.66 cm      PV Peak grad:  3.2 mmHg AV Area (VTI):     1.83 cm AV Vmax:           229.67 cm/s AV Vmean:          149.000 cm/s AV VTI:            0.463 m AV Peak Grad:      21.1 mmHg AV Mean Grad:      10.7 mmHg LVOT Vmax:         101.00 cm/s LVOT Vmean:        59.700 cm/s LVOT VTI:          0.204 m LVOT/AV VTI ratio: 0.44  AORTA Ao Root diam: 4.00 cm Ao Asc diam:  3.50 cm MITRAL  VALVE MV Area (PHT): 2.34 cm     SHUNTS MV Area VTI:   1.87 cm     Systemic VTI:  0.20 m MV Peak grad:  8.0 mmHg     Systemic Diam: 2.30 cm MV Mean grad:  4.0 mmHg     Pulmonic Diam: 4.20 cm MV Vmax:       1.41 m/s MV Vmean:      88.9 cm/s MV Decel Time: 324 msec MV E velocity: 102.00 cm/s MV A velocity: 127.00 cm/s MV E/A ratio:  0.80 Belva Boyden MD Electronically signed by Belva Boyden MD Signature Date/Time: 07/05/2023/2:38:26 PM    Final    CT CHEST ABDOMEN PELVIS W CONTRAST Result Date: 07/05/2023 CLINICAL DATA:  Vomiting, abdominal pain, and sepsis. EXAM: CT CHEST, ABDOMEN, AND PELVIS WITH CONTRAST TECHNIQUE: Multidetector CT imaging of the chest, abdomen and pelvis was performed following the standard protocol during bolus administration of intravenous contrast. RADIATION DOSE REDUCTION: This exam was performed according to the departmental dose-optimization program which includes automated exposure control, adjustment of the mA and/or kV according to patient size and/or use of iterative reconstruction technique. CONTRAST:  80mL OMNIPAQUE  IOHEXOL  300 MG/ML  SOLN COMPARISON:  Portable chest today, abdomen and pelvis CT with contrast 06/29/2023, and chest, abdomen and pelvis CT with IV contrast 11/16/2021. FINDINGS: CT CHEST FINDINGS Cardiovascular: There is mild cardiomegaly. Three-vessel coronary artery calcifications with prior LAD stenting. There are patchy calcific plaques in the thoracic aorta, scattered calcification in the great vessels. No aneurysm, dissection or stenosis. There are calcifications and thickening of the aortic valve leaflets. Consider echocardiographic follow-up to assess valvular function. There is no pericardial effusion. The pulmonary arteries and veins are normal in caliber. The pulmonary arteries are centrally clear. Mediastinum/Nodes: Chronic mediastinal lipomatosis. No adenopathy. Hiatal fat hernia. Lungs/Pleura: Chronic left-sided volume loss and posterior basal  atelectasis. There is respiratory motion. Mild posterior atelectasis on the right. No consolidation or nodule is seen through the breathing motion. No effusion. Trachea and central airways are clear Musculoskeletal: 1 cm bone island again noted T1 vertebral body. No acute or other significant osseous  findings. Degenerative disc disease and spondylosis mid to lower thoracic spine. The ribcage is intact. Small amount of gynecomastia noted bilaterally. CT ABDOMEN PELVIS FINDINGS Hepatobiliary: The liver is 24 cm in length and moderately steatotic. There is no mass enhancement. New compared with 6 days ago, there is increased gallbladder wall thickening, pericholecystic fluid and pericholecystic edema. Findings are most likely due to acute cholecystitis. There is a single 1 cm partially calcified stone in the proximal gallbladder. There are no other visible stones. There is no intrahepatic or extrahepatic bile duct dilatation. Pancreas: Partially atrophic.  Otherwise unremarkable. Spleen: Unremarkable. Adrenals/Urinary Tract: There is no adrenal or renal mass enhancement. There are 2 punctate nonobstructive caliceal stones in the superior pole of the right kidney. No left nephrolithiasis is seen and no ureteral stones or hydronephrosis bilaterally. The bladder is contracted and not well seen. Stomach/Bowel: No dilatation or wall thickening including the appendix. Scattered colonic diverticula without diverticulitis. Vascular/Lymphatic: Aortic atherosclerosis. No enlarged abdominal or pelvic lymph nodes. Reproductive: No prostatomegaly. Other: No pelvic ascites. Trace reactive fluid right paracolic gutter. No free air, free hemorrhage or abscess. Small umbilical and bilateral inguinal fat hernias. Musculoskeletal: Degenerative change and mild dextroscoliosis lumbar spine. Mild hip DJD. No acute or significant regional osseous findings. IMPRESSION: 1. No acute chest CT findings. 2. Cardiomegaly with aortic and coronary  artery atherosclerosis. 3. Calcifications and thickening of the aortic valve leaflets. Consider echocardiographic follow-up to assess valvular function. 4. Hiatal fat hernia. 5. Chronic left-sided volume loss and posterior basal atelectasis. 6. Cholelithiasis with increased gallbladder wall thickening, pericholecystic fluid and pericholecystic edema, most likely due to acute cholecystitis. 7. Hepatomegaly and steatosis. 8. Nonobstructive right micronephrolithiasis. 9. Diverticulosis without evidence of diverticulitis. 10. Umbilical and inguinal fat hernias. 11. Critical Value/emergent results were called by telephone at the time of interpretation on 07/05/2023 at 4:22 am to provider [facility] , who verbally acknowledged these results. Electronically Signed   By: Denman Fischer M.D.   On: 07/05/2023 04:36   DG Chest Port 1 View Result Date: 07/05/2023 CLINICAL DATA:  Possible sepsis EXAM: PORTABLE CHEST 1 VIEW COMPARISON:  06/28/2023 FINDINGS: Stable cardiomegaly. Lungs are hypoinflated but clear. No bony abnormality is noted. IMPRESSION: Unchanged from the prior exam. Electronically Signed   By: Violeta Grey M.D.   On: 07/05/2023 02:08   CT ABDOMEN PELVIS W CONTRAST Result Date: 06/29/2023 CLINICAL DATA:  Increased lethargy. EXAM: CT ABDOMEN AND PELVIS WITH CONTRAST TECHNIQUE: Multidetector CT imaging of the abdomen and pelvis was performed using the standard protocol following bolus administration of intravenous contrast. RADIATION DOSE REDUCTION: This exam was performed according to the departmental dose-optimization program which includes automated exposure control, adjustment of the mA and/or kV according to patient size and/or use of iterative reconstruction technique. CONTRAST:  OMNIPAQUE  IOHEXOL  350 MG/ML SOLN COMPARISON:  November 16, 2021 FINDINGS: Lower chest: Mild to moderate severity scarring and/or atelectasis is seen within the left lung base. Hepatobiliary: There is diffuse fatty  infiltration of the liver parenchyma. A punctate gallstone is seen within the gallbladder without evidence of gallbladder wall thickening or biliary dilatation. Pancreas: Unremarkable. No pancreatic ductal dilatation or surrounding inflammatory changes. Spleen: Normal in size without focal abnormality. Adrenals/Urinary Tract: Adrenal glands are unremarkable. Kidneys are normal in size, without obstructing renal calculi, focal lesion, or hydronephrosis. A punctate nonobstructing renal calculus is seen within the upper pole of the right kidney. Bladder is unremarkable. Stomach/Bowel: There is a small hiatal hernia. Appendix appears normal. No evidence of bowel  wall thickening, distention, or inflammatory changes. Vascular/Lymphatic: Aortic atherosclerosis. No enlarged abdominal or pelvic lymph nodes. Reproductive: Prostate is unremarkable. Other: A small, stable fat containing umbilical hernia is seen. No abdominopelvic ascites. Musculoskeletal: Multilevel degenerative changes are seen throughout the lumbar spine. IMPRESSION: 1. Fatty liver. 2. Cholelithiasis. 3. Punctate nonobstructing right renal calculus. 4. Small hiatal hernia. 5. Small, stable fat containing umbilical hernia. 6. Aortic atherosclerosis. Electronically Signed   By: Virgle Grime M.D.   On: 06/29/2023 03:40   DG Chest Port 1 View Result Date: 06/28/2023 CLINICAL DATA:  Lethargy EXAM: PORTABLE CHEST 1 VIEW COMPARISON:  11/16/2021 FINDINGS: Low lung volumes. Cardiomegaly with aortic atherosclerosis. No consolidation, pleural effusion, or pneumothorax IMPRESSION: Low lung volumes. Cardiomegaly. Electronically Signed   By: Esmeralda Hedge M.D.   On: 06/28/2023 23:19    Microbiology: Recent Results (from the past 240 hours)  Blood Culture (routine x 2)     Status: None   Collection Time: 07/05/23  1:25 AM   Specimen: BLOOD RIGHT ARM  Result Value Ref Range Status   Specimen Description   Final    BLOOD RIGHT ARM Performed at [facility], [address] [place], [place] [address]    Special Requests   Final    BOTTLES DRAWN AEROBIC AND ANAEROBIC Blood Culture adequate volume   Culture   Final    NO GROWTH 5 DAYS Performed at [facility], [address] [place] [place], [place] [address]    Report Status 07/10/2023 FINAL  Final  Blood Culture (routine x 2)     Status: None   Collection Time: 07/05/23  1:25 AM   Specimen: BLOOD  Result Value Ref Range Status   Specimen Description BLOOD LAC  Final   Special Requests   Final    BOTTLES DRAWN AEROBIC AND ANAEROBIC Blood Culture adequate volume   Culture   Final    NO GROWTH 5 DAYS Performed at [facility], [address] [place], [place] [address]    Report Status 07/10/2023 FINAL  Final  Resp panel by RT-PCR (RSV, Flu A&B, Covid) Anterior Nasal Swab     Status: None   Collection Time: 07/05/23  1:48 AM   Specimen: Anterior Nasal Swab  Result Value Ref Range Status   SARS Coronavirus 2 by RT PCR NEGATIVE NEGATIVE Final    Comment: (NOTE) SARS-CoV-2 target nucleic acids are NOT DETECTED.  The SARS-CoV-2 RNA is generally detectable in upper respiratory specimens during the acute phase of infection. The lowest concentration of SARS-CoV-2 viral copies this assay can detect is 138 copies/mL. A negative result does not preclude SARS-Cov-2 infection and should not be used as the sole basis for treatment or other patient management decisions. A negative result may occur with  improper specimen collection/handling, submission of specimen other than nasopharyngeal swab, presence of viral mutation(s) within the areas targeted by this assay, and inadequate number of viral copies(<138 copies/mL). A negative result must be combined with clinical observations, patient history, and epidemiological information. The expected result is Negative.  Fact Sheet for Patients:  BloggerCourse.com  Fact Sheet for Healthcare Providers:   SeriousBroker.it  This test is no t yet approved or cleared by the [place]  FDA and  has been authorized for detection and/or diagnosis of SARS-CoV-2 by FDA under an Emergency Use Authorization (EUA). This EUA will remain  in effect (meaning this test can be used) for the duration of the COVID-19 declaration under Section 564(b)(1) of the Act, 21  U.S.C.section 360bbb-3(b)(1), unless the authorization is terminated  or revoked sooner.       Influenza A by PCR NEGATIVE NEGATIVE Final   Influenza B by PCR NEGATIVE NEGATIVE Final    Comment: (NOTE) The Xpert Xpress SARS-CoV-2/FLU/RSV plus assay is intended as an aid in the diagnosis of influenza from Nasopharyngeal swab specimens and should not be used as a sole basis for treatment. Nasal washings and aspirates are unacceptable for Xpert Xpress SARS-CoV-2/FLU/RSV testing.  Fact Sheet for Patients: BloggerCourse.com  Fact Sheet for Healthcare Providers: SeriousBroker.it  This test is not yet approved or cleared by the [place]  FDA and has been authorized for detection and/or diagnosis of SARS-CoV-2 by FDA under an Emergency Use Authorization (EUA). This EUA will remain in effect (meaning this test can be used) for the duration of the COVID-19 declaration under Section 564(b)(1) of the Act, 21 U.S.C. section 360bbb-3(b)(1), unless the authorization is terminated or revoked.     Resp Syncytial Virus by PCR NEGATIVE NEGATIVE Final    Comment: (NOTE) Fact Sheet for Patients: BloggerCourse.com  Fact Sheet for Healthcare Providers: SeriousBroker.it  This test is not yet approved or cleared by the [place]  FDA and has been authorized for detection and/or diagnosis of SARS-CoV-2 by FDA under an Emergency Use Authorization (EUA). This EUA will remain in effect (meaning this test can be used) for  the duration of the COVID-19 declaration under Section 564(b)(1) of the Act, 21 U.S.C. section 360bbb-3(b)(1), unless the authorization is terminated or revoked.  Performed at [facility], [address], [place], [place] [address]   Urine Culture     Status: None   Collection Time: 07/05/23  3:08 AM   Specimen: Urine, Random  Result Value Ref Range Status   Specimen Description   Final    URINE, RANDOM Performed at [facility], [address] [place], [place] [address]    Special Requests   Final    NONE Reflexed from [id number] Performed at [facility], [address] [place], [place] [address]    Culture   Final    NO GROWTH Performed at [facility], [address] [place], [place] [address]    Report Status 07/06/2023 FINAL  Final  Aerobic/Anaerobic Culture w Gram Stain (surgical/deep wound)     Status: None   Collection Time: 07/05/23  4:26 PM   Specimen: Abscess  Result Value Ref Range Status   Specimen Description   Final    ABSCESS Performed at [facility], [address], [place], [place] [address]    Special Requests   Final     GALLBLADDER Performed at [facility], [address] [place], [place] [address]    Gram Stain   Final    MODERATE WBC PRESENT,BOTH PMN AND MONONUCLEAR FEW GRAM NEGATIVE RODS    Culture   Final    ABUNDANT ESCHERICHIA COLI NO ANAEROBES ISOLATED Performed at [facility], [address] [place], [place] [address]    Report Status 07/12/2023 FINAL  Final   Organism ID, Bacteria ESCHERICHIA COLI  Final   Organism ID, Bacteria ESCHERICHIA COLI  Final      Susceptibility   Escherichia coli - MIC*    AMPICILLIN  >=32 RESISTANT Resistant     CEFEPIME  <=0.12 SENSITIVE Sensitive     CEFTAZIDIME <=1 SENSITIVE Sensitive     CEFTRIAXONE  <=0.25 SENSITIVE Sensitive     CIPROFLOXACIN  >=4 RESISTANT Resistant     GENTAMICIN <=1  SENSITIVE Sensitive     IMIPENEM <=0.25 SENSITIVE  Sensitive     TRIMETH/SULFA >=320 RESISTANT Resistant     AMPICILLIN /SULBACTAM 16 INTERMEDIATE Intermediate     PIP/TAZO <=4 SENSITIVE Sensitive ug/mL   Escherichia coli - KIRBY BAUER*    CEFAZOLIN INTERMEDIATE Intermediate     * ABUNDANT ESCHERICHIA COLI    ABUNDANT ESCHERICHIA COLI     Labs: Basic Metabolic Panel: Recent Labs  Lab 07/07/23 0523 07/08/23 0533 07/09/23 0509 07/10/23 0525 07/11/23 0517  NA 138 136 135 138 134*  K 3.4* 3.5 3.3* 3.5 3.4*  CL 100 104 101 103 101  CO2 24 26 25 24 24   GLUCOSE 152* 156* 166* 166* 160*  BUN 16 13 14 15 16   CREATININE 0.87 0.90 0.91 0.85 0.88  CALCIUM  8.4* 8.1* 7.9* 8.3* 8.3*  MG 2.1 1.8 1.9 1.9 2.0  PHOS 3.5 3.6 3.6 3.1 3.6   Liver Function Tests: Recent Labs  Lab 07/07/23 0523 07/08/23 0533 07/09/23 0509 07/10/23 0525 07/11/23 0517  AST 33 66* 71* 35 28  ALT 18 32 39 30 25  ALKPHOS 61 77 82 86 76  BILITOT 0.9 0.8 0.5 0.6 0.7  PROT 6.3* 6.6 6.7 7.0 6.9  ALBUMIN 2.3* 2.3* 2.4* 2.6* 2.3*   No results for input(s): "LIPASE", "AMYLASE" in the last 168 hours. No results for input(s): "AMMONIA" in the last 168 hours. CBC: Recent Labs  Lab 07/08/23 0533 07/09/23 0509 07/10/23 0525 07/11/23 0517 07/12/23 0906  WBC 16.2* 15.5* 17.0* 16.1* 14.3*  NEUTROABS 11.4* 10.5* 12.0* 11.7* 10.2*  HGB 10.4* 10.5* 10.5* 10.3* 10.6*  HCT 31.6* 31.9* 32.0* 32.0* 32.8*  MCV 89.5 88.1 88.4 90.1 89.6  PLT 376 419* 435* 428* 425*   Cardiac Enzymes: No results for input(s): "CKTOTAL", "CKMB", "CKMBINDEX", "TROPONINI" in the last 168 hours. BNP: BNP (last 3 results) No results for input(s): "BNP" in the last 8760 hours.  ProBNP (last 3 results) No results for input(s): "PROBNP" in the last 8760 hours.  CBG: Recent Labs  Lab 07/11/23 1141 07/11/23 1625 07/11/23 2103 07/12/23 0818 07/12/23 1245  GLUCAP 173* 165* 163* 153* 179*       Signed:  Raymonde Calico MD.  [facility] 07/12/2023, 3:41 PM

## 2023-07-12 NOTE — Progress Notes (Signed)

## 2023-07-12 NOTE — TOC Transition Note (Signed)
 Transition of Care [facility]) - Discharge Note   Patient Details  Name: Jeff Key MRN: [id number] Date of Birth: 12-02-1945  [facility] of [facility] CM/SW Contact:  Brycelyn Gambino C Kadeshia Kasparian, RN Phone Number: 07/12/2023, 3:44 PM   Clinical Narrative:     Spoke with patient's wife to advised patient [facility] RN has been arranged via [name]. She was advised they will call within 48 hours of discharge.   EMS arranged  Face sheet and medical necessity forms printed to the floor to be added to the EMS packet. Nurse notified    TOC signing off.          Patient Goals and CMS Choice            Discharge Placement                       Discharge Plan and Services Additional resources added to the After Visit Summary for                                       Social Drivers of Health (SDOH) Interventions SDOH Screenings   Food Insecurity: No Food Insecurity (07/07/2023)  Housing: Low Risk  (07/07/2023)  Transportation Needs: Patient Unable To Answer (07/07/2023)  Utilities: Patient Unable To Answer (07/07/2023)  Financial Resource Strain: Low Risk  (07/13/2022)   [facility] from [facility]  Social Connections: Unknown (07/07/2023)  Tobacco Use: Medium Risk (07/05/2023)     Readmission Risk Interventions     No data to display

## 2023-07-12 NOTE — Progress Notes (Signed)
   Dr. Sari Cunning reached out and shared intent to discharge with IR team. At bedside, spoke to wife about how flushing the drain would change from what she saw while inpatient. Teaching included: Clean technique emphasized with flushing . Flushing can be as needed when she notices reduced output, though we will send rx to [facility] community pharmacy enough for her to flush daily if needed. Shared need for dressing changes every 2 to 3 days or when soiled. Do not submerge. Record output daily. Do not allow bag to become completely full of fluid before emptying. Contact IR with concerns for change in volume or color of output or any other drain concern. Expect a call for 8 week follow up to inject/exchange drain.  She verbalizes understanding. No holds from IR perspective for DC.   Electronically Signed: Terressa Fess, NP 07/12/2023, 3:40 PM

## 2023-07-13 DIAGNOSIS — M71059 Abscess of bursa, unspecified hip: Secondary | ICD-10-CM | POA: Diagnosis not present

## 2023-07-13 DIAGNOSIS — K760 Fatty (change of) liver, not elsewhere classified: Secondary | ICD-10-CM | POA: Diagnosis not present

## 2023-07-13 DIAGNOSIS — I255 Ischemic cardiomyopathy: Secondary | ICD-10-CM | POA: Diagnosis not present

## 2023-07-13 DIAGNOSIS — E8889 Other specified metabolic disorders: Secondary | ICD-10-CM | POA: Diagnosis not present

## 2023-07-13 DIAGNOSIS — K8020 Calculus of gallbladder without cholecystitis without obstruction: Secondary | ICD-10-CM | POA: Diagnosis not present

## 2023-07-13 DIAGNOSIS — Z452 Encounter for adjustment and management of vascular access device: Secondary | ICD-10-CM | POA: Diagnosis not present

## 2023-07-13 DIAGNOSIS — Z7982 Long term (current) use of aspirin: Secondary | ICD-10-CM | POA: Diagnosis not present

## 2023-07-13 DIAGNOSIS — K5730 Diverticulosis of large intestine without perforation or abscess without bleeding: Secondary | ICD-10-CM | POA: Diagnosis not present

## 2023-07-13 DIAGNOSIS — I252 Old myocardial infarction: Secondary | ICD-10-CM | POA: Diagnosis not present

## 2023-07-13 DIAGNOSIS — E669 Obesity, unspecified: Secondary | ICD-10-CM | POA: Diagnosis not present

## 2023-07-13 DIAGNOSIS — I080 Rheumatic disorders of both mitral and aortic valves: Secondary | ICD-10-CM | POA: Diagnosis not present

## 2023-07-13 DIAGNOSIS — R6520 Severe sepsis without septic shock: Secondary | ICD-10-CM | POA: Diagnosis not present

## 2023-07-13 DIAGNOSIS — F0280 Dementia in other diseases classified elsewhere without behavioral disturbance: Secondary | ICD-10-CM | POA: Diagnosis not present

## 2023-07-13 DIAGNOSIS — E872 Acidosis, unspecified: Secondary | ICD-10-CM | POA: Diagnosis not present

## 2023-07-13 DIAGNOSIS — K651 Peritoneal abscess: Secondary | ICD-10-CM | POA: Diagnosis not present

## 2023-07-13 DIAGNOSIS — Z792 Long term (current) use of antibiotics: Secondary | ICD-10-CM | POA: Diagnosis not present

## 2023-07-13 DIAGNOSIS — E119 Type 2 diabetes mellitus without complications: Secondary | ICD-10-CM | POA: Diagnosis not present

## 2023-07-13 DIAGNOSIS — G309 Alzheimer's disease, unspecified: Secondary | ICD-10-CM | POA: Diagnosis not present

## 2023-07-13 DIAGNOSIS — K219 Gastro-esophageal reflux disease without esophagitis: Secondary | ICD-10-CM | POA: Diagnosis not present

## 2023-07-13 DIAGNOSIS — I5022 Chronic systolic (congestive) heart failure: Secondary | ICD-10-CM | POA: Diagnosis not present

## 2023-07-13 DIAGNOSIS — Z6835 Body mass index (BMI) 35.0-35.9, adult: Secondary | ICD-10-CM | POA: Diagnosis not present

## 2023-07-13 DIAGNOSIS — I2510 Atherosclerotic heart disease of native coronary artery without angina pectoris: Secondary | ICD-10-CM | POA: Diagnosis not present

## 2023-07-13 DIAGNOSIS — D649 Anemia, unspecified: Secondary | ICD-10-CM | POA: Diagnosis not present

## 2023-07-13 DIAGNOSIS — K449 Diaphragmatic hernia without obstruction or gangrene: Secondary | ICD-10-CM | POA: Diagnosis not present

## 2023-07-13 DIAGNOSIS — A419 Sepsis, unspecified organism: Secondary | ICD-10-CM | POA: Diagnosis not present

## 2023-07-13 DIAGNOSIS — K810 Acute cholecystitis: Secondary | ICD-10-CM | POA: Diagnosis not present

## 2023-07-13 DIAGNOSIS — N179 Acute kidney failure, unspecified: Secondary | ICD-10-CM | POA: Diagnosis not present

## 2023-07-19 DIAGNOSIS — A419 Sepsis, unspecified organism: Secondary | ICD-10-CM | POA: Diagnosis not present

## 2023-07-20 DIAGNOSIS — M71059 Abscess of bursa, unspecified hip: Secondary | ICD-10-CM | POA: Diagnosis not present

## 2023-07-20 DIAGNOSIS — E872 Acidosis, unspecified: Secondary | ICD-10-CM | POA: Diagnosis not present

## 2023-07-20 DIAGNOSIS — R6520 Severe sepsis without septic shock: Secondary | ICD-10-CM | POA: Diagnosis not present

## 2023-07-20 DIAGNOSIS — A419 Sepsis, unspecified organism: Secondary | ICD-10-CM | POA: Diagnosis not present

## 2023-07-24 ENCOUNTER — Ambulatory Visit: Attending: Infectious Diseases | Admitting: Infectious Diseases

## 2023-07-24 DIAGNOSIS — Z7902 Long term (current) use of antithrombotics/antiplatelets: Secondary | ICD-10-CM | POA: Diagnosis not present

## 2023-07-24 DIAGNOSIS — I2510 Atherosclerotic heart disease of native coronary artery without angina pectoris: Secondary | ICD-10-CM | POA: Diagnosis not present

## 2023-07-24 DIAGNOSIS — D649 Anemia, unspecified: Secondary | ICD-10-CM | POA: Insufficient documentation

## 2023-07-24 DIAGNOSIS — Z9359 Other cystostomy status: Secondary | ICD-10-CM | POA: Insufficient documentation

## 2023-07-24 DIAGNOSIS — F03C0 Unspecified dementia, severe, without behavioral disturbance, psychotic disturbance, mood disturbance, and anxiety: Secondary | ICD-10-CM | POA: Diagnosis not present

## 2023-07-24 DIAGNOSIS — K810 Acute cholecystitis: Secondary | ICD-10-CM

## 2023-07-24 DIAGNOSIS — Z955 Presence of coronary angioplasty implant and graft: Secondary | ICD-10-CM | POA: Insufficient documentation

## 2023-07-24 DIAGNOSIS — Z7982 Long term (current) use of aspirin: Secondary | ICD-10-CM | POA: Insufficient documentation

## 2023-07-24 DIAGNOSIS — Z4889 Encounter for other specified surgical aftercare: Secondary | ICD-10-CM | POA: Insufficient documentation

## 2023-07-24 NOTE — Progress Notes (Signed)
  Consent was obtained for video visit:  Yes.   Answered questions that patient had about telehealth interaction:  Yes.   I discussed the limitations, risks, security and privacy concerns of performing an evaluation and management service by telephone. I also discussed with the patient that there may be a patient responsible charge related to this service. The patient expressed understanding and agreed to proceed.   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 Workup revealed acute cholecystitis and sepsis and he had a perc cholecystostomy drain and started IV zosyn  Culture from the bile was ecoli R to oral antibiotics Repeat CT showed a non drainable small pelvic abscess He had temporary foley catheter for urinary rentention  HE was discharged home on Iv ceftriaxone  and Po flagyl  until 5/5 Today his wife states he is doing better IV antibiotic completed yesterday and PICC will be removed today HE is eating better Passing urine  Impression/recommendation  Severe dementia nonverbal  Acute cholecystitis with cholelithiasis status post [facility] cholecystostomy.  He will follow-up with the interventional radiologist for tube removal and 2 months Completed IV ceftriaxone  and p.o. Flagyl  for E. coli Colace discitis. 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  Will follow him as needed. Total time spent on this visit 20 min

## 2023-07-26 DIAGNOSIS — Z938 Other artificial opening status: Secondary | ICD-10-CM | POA: Diagnosis not present

## 2023-08-06 ENCOUNTER — Telehealth: Payer: Self-pay

## 2023-08-06 NOTE — Telephone Encounter (Signed)
 Received VM from patient's spouse, [name] (DPR), she stated drainage from gallbladder has more than doubled in the last few days.   Called [name] back, she says she spoke with the on-call provider for Dr. Kevan Peers yesterday and is awaiting a call back. States the drainage has become darker in color since Shantel was discharged, but that it is still a yellow/green color. States Lazarus has not had any fever and does not appear to be in any pain. Reports his vitals seem fine. They have a nurse coming out to the house today. Provided her with phone number to drain clinic for follow up.   Damarien Nyman, BSN, RN

## 2023-08-07 DIAGNOSIS — Z938 Other artificial opening status: Secondary | ICD-10-CM | POA: Diagnosis not present

## 2023-08-17 ENCOUNTER — Other Ambulatory Visit: Payer: Self-pay

## 2023-08-17 ENCOUNTER — Emergency Department

## 2023-08-17 ENCOUNTER — Encounter: Payer: Self-pay | Admitting: Internal Medicine

## 2023-08-17 ENCOUNTER — Inpatient Hospital Stay
Admission: EM | Admit: 2023-08-17 | Discharge: 2023-08-27 | DRG: 871 | Disposition: A | Discharge status: Skilled Nursing Facility | Attending: Internal Medicine | Admitting: Internal Medicine

## 2023-08-17 DIAGNOSIS — I5022 Chronic systolic (congestive) heart failure: Secondary | ICD-10-CM | POA: Diagnosis present

## 2023-08-17 DIAGNOSIS — K5909 Other constipation: Secondary | ICD-10-CM | POA: Diagnosis not present

## 2023-08-17 DIAGNOSIS — B369 Superficial mycosis, unspecified: Secondary | ICD-10-CM | POA: Diagnosis not present

## 2023-08-17 DIAGNOSIS — E785 Hyperlipidemia, unspecified: Secondary | ICD-10-CM | POA: Diagnosis not present

## 2023-08-17 DIAGNOSIS — Z7902 Long term (current) use of antithrombotics/antiplatelets: Secondary | ICD-10-CM

## 2023-08-17 DIAGNOSIS — R000 Tachycardia, unspecified: Secondary | ICD-10-CM | POA: Diagnosis not present

## 2023-08-17 DIAGNOSIS — Z1152 Encounter for screening for COVID-19: Secondary | ICD-10-CM

## 2023-08-17 DIAGNOSIS — Z789 Other specified health status: Secondary | ICD-10-CM | POA: Diagnosis not present

## 2023-08-17 DIAGNOSIS — A419 Sepsis, unspecified organism: Principal | ICD-10-CM | POA: Diagnosis present

## 2023-08-17 DIAGNOSIS — R932 Abnormal findings on diagnostic imaging of liver and biliary tract: Secondary | ICD-10-CM | POA: Diagnosis not present

## 2023-08-17 DIAGNOSIS — E872 Acidosis, unspecified: Secondary | ICD-10-CM | POA: Diagnosis present

## 2023-08-17 DIAGNOSIS — T801XXS Vascular complications following infusion, transfusion and therapeutic injection, sequela: Secondary | ICD-10-CM | POA: Diagnosis not present

## 2023-08-17 DIAGNOSIS — R55 Syncope and collapse: Secondary | ICD-10-CM | POA: Diagnosis not present

## 2023-08-17 DIAGNOSIS — K5190 Ulcerative colitis, unspecified, without complications: Secondary | ICD-10-CM | POA: Diagnosis present

## 2023-08-17 DIAGNOSIS — I255 Ischemic cardiomyopathy: Secondary | ICD-10-CM | POA: Diagnosis present

## 2023-08-17 DIAGNOSIS — I2510 Atherosclerotic heart disease of native coronary artery without angina pectoris: Secondary | ICD-10-CM | POA: Diagnosis not present

## 2023-08-17 DIAGNOSIS — Z7982 Long term (current) use of aspirin: Secondary | ICD-10-CM

## 2023-08-17 DIAGNOSIS — R404 Transient alteration of awareness: Secondary | ICD-10-CM | POA: Diagnosis not present

## 2023-08-17 DIAGNOSIS — K810 Acute cholecystitis: Secondary | ICD-10-CM | POA: Diagnosis not present

## 2023-08-17 DIAGNOSIS — I6782 Cerebral ischemia: Secondary | ICD-10-CM | POA: Diagnosis not present

## 2023-08-17 DIAGNOSIS — Z794 Long term (current) use of insulin: Secondary | ICD-10-CM

## 2023-08-17 DIAGNOSIS — Z955 Presence of coronary angioplasty implant and graft: Secondary | ICD-10-CM

## 2023-08-17 DIAGNOSIS — I10 Essential (primary) hypertension: Secondary | ICD-10-CM | POA: Diagnosis not present

## 2023-08-17 DIAGNOSIS — J189 Pneumonia, unspecified organism: Secondary | ICD-10-CM

## 2023-08-17 DIAGNOSIS — E876 Hypokalemia: Secondary | ICD-10-CM | POA: Diagnosis not present

## 2023-08-17 DIAGNOSIS — J168 Pneumonia due to other specified infectious organisms: Secondary | ICD-10-CM | POA: Diagnosis not present

## 2023-08-17 DIAGNOSIS — I672 Cerebral atherosclerosis: Secondary | ICD-10-CM | POA: Diagnosis not present

## 2023-08-17 DIAGNOSIS — L8990 Pressure ulcer of unspecified site, unspecified stage: Secondary | ICD-10-CM | POA: Insufficient documentation

## 2023-08-17 DIAGNOSIS — Z515 Encounter for palliative care: Secondary | ICD-10-CM | POA: Diagnosis not present

## 2023-08-17 DIAGNOSIS — L89891 Pressure ulcer of other site, stage 1: Secondary | ICD-10-CM | POA: Diagnosis present

## 2023-08-17 DIAGNOSIS — F0280 Dementia in other diseases classified elsewhere without behavioral disturbance: Secondary | ICD-10-CM | POA: Diagnosis present

## 2023-08-17 DIAGNOSIS — Z8042 Family history of malignant neoplasm of prostate: Secondary | ICD-10-CM

## 2023-08-17 DIAGNOSIS — T801XXA Vascular complications following infusion, transfusion and therapeutic injection, initial encounter: Secondary | ICD-10-CM | POA: Insufficient documentation

## 2023-08-17 DIAGNOSIS — E669 Obesity, unspecified: Secondary | ICD-10-CM | POA: Diagnosis present

## 2023-08-17 DIAGNOSIS — G309 Alzheimer's disease, unspecified: Secondary | ICD-10-CM | POA: Diagnosis not present

## 2023-08-17 DIAGNOSIS — K5900 Constipation, unspecified: Secondary | ICD-10-CM | POA: Diagnosis present

## 2023-08-17 DIAGNOSIS — R4182 Altered mental status, unspecified: Secondary | ICD-10-CM | POA: Diagnosis not present

## 2023-08-17 DIAGNOSIS — R638 Other symptoms and signs concerning food and fluid intake: Secondary | ICD-10-CM

## 2023-08-17 DIAGNOSIS — Z66 Do not resuscitate: Secondary | ICD-10-CM | POA: Diagnosis present

## 2023-08-17 DIAGNOSIS — R531 Weakness: Secondary | ICD-10-CM | POA: Diagnosis not present

## 2023-08-17 DIAGNOSIS — Z9689 Presence of other specified functional implants: Secondary | ICD-10-CM | POA: Diagnosis present

## 2023-08-17 DIAGNOSIS — R918 Other nonspecific abnormal finding of lung field: Secondary | ICD-10-CM | POA: Diagnosis not present

## 2023-08-17 DIAGNOSIS — Z7189 Other specified counseling: Secondary | ICD-10-CM | POA: Diagnosis not present

## 2023-08-17 DIAGNOSIS — R0989 Other specified symptoms and signs involving the circulatory and respiratory systems: Secondary | ICD-10-CM | POA: Diagnosis not present

## 2023-08-17 DIAGNOSIS — Z6831 Body mass index (BMI) 31.0-31.9, adult: Secondary | ICD-10-CM

## 2023-08-17 DIAGNOSIS — Z87891 Personal history of nicotine dependence: Secondary | ICD-10-CM

## 2023-08-17 DIAGNOSIS — E119 Type 2 diabetes mellitus without complications: Secondary | ICD-10-CM | POA: Diagnosis present

## 2023-08-17 DIAGNOSIS — J181 Lobar pneumonia, unspecified organism: Secondary | ICD-10-CM | POA: Diagnosis not present

## 2023-08-17 DIAGNOSIS — G9389 Other specified disorders of brain: Secondary | ICD-10-CM | POA: Diagnosis not present

## 2023-08-17 DIAGNOSIS — I252 Old myocardial infarction: Secondary | ICD-10-CM

## 2023-08-17 DIAGNOSIS — Z7984 Long term (current) use of oral hypoglycemic drugs: Secondary | ICD-10-CM

## 2023-08-17 DIAGNOSIS — R6520 Severe sepsis without septic shock: Secondary | ICD-10-CM | POA: Diagnosis not present

## 2023-08-17 DIAGNOSIS — G9341 Metabolic encephalopathy: Secondary | ICD-10-CM | POA: Diagnosis present

## 2023-08-17 DIAGNOSIS — I6523 Occlusion and stenosis of bilateral carotid arteries: Secondary | ICD-10-CM | POA: Diagnosis not present

## 2023-08-17 LAB — AMMONIA: Ammonia: 23 umol/L (ref 9–35)

## 2023-08-17 LAB — COMPREHENSIVE METABOLIC PANEL WITH GFR
ALT: 25 U/L (ref 0–44)
AST: 44 U/L — ABNORMAL HIGH (ref 15–41)
Albumin: 3.6 g/dL (ref 3.5–5.0)
Alkaline Phosphatase: 84 U/L (ref 38–126)
Anion gap: 11 (ref 5–15)
BUN: 15 mg/dL (ref 8–23)
CO2: 25 mmol/L (ref 22–32)
Calcium: 9 mg/dL (ref 8.9–10.3)
Chloride: 99 mmol/L (ref 98–111)
Creatinine, Ser: 0.86 mg/dL (ref 0.61–1.24)
GFR, Estimated: >60 mL/min (ref >60)
Glucose, Bld: 177 mg/dL — ABNORMAL HIGH (ref 70–99)
Potassium: 4.7 mmol/L (ref 3.5–5.1)
Sodium: 135 mmol/L (ref 135–145)
Total Bilirubin: 1.1 mg/dL (ref 0.0–1.2)
Total Protein: 8.1 g/dL (ref 6.5–8.1)

## 2023-08-17 LAB — URINALYSIS, W/ REFLEX TO CULTURE (INFECTION SUSPECTED)
Bacteria, UA: NONE SEEN
Bilirubin Urine: NEGATIVE
Glucose, UA: NEGATIVE mg/dL
Hgb urine dipstick: NEGATIVE
Ketones, ur: NEGATIVE mg/dL
Leukocytes,Ua: NEGATIVE
Nitrite: NEGATIVE
Protein, ur: NEGATIVE mg/dL
Specific Gravity, Urine: 1.028 (ref 1.005–1.030)
Squamous Epithelial / HPF: 0 /HPF (ref 0–5)
pH: 5 (ref 5.0–8.0)

## 2023-08-17 LAB — PROTIME-INR
INR: 1.1 (ref 0.8–1.2)
Prothrombin Time: 14.5 s (ref 11.4–15.2)

## 2023-08-17 LAB — CBC WITH DIFFERENTIAL/PLATELET
Abs Immature Granulocytes: 0.15 10*3/uL — ABNORMAL HIGH (ref 0.00–0.07)
Basophils Absolute: 0.1 10*3/uL (ref 0.0–0.1)
Basophils Relative: 1 %
Eosinophils Absolute: 0.1 10*3/uL (ref 0.0–0.5)
Eosinophils Relative: 1 %
HCT: 40.7 % (ref 39.0–52.0)
Hemoglobin: 13.3 g/dL (ref 13.0–17.0)
Immature Granulocytes: 1 %
Lymphocytes Relative: 11 %
Lymphs Abs: 2.2 10*3/uL (ref 0.7–4.0)
MCH: 28.4 pg (ref 26.0–34.0)
MCHC: 32.7 g/dL (ref 30.0–36.0)
MCV: 87 fL (ref 80.0–100.0)
Monocytes Absolute: 1.7 10*3/uL — ABNORMAL HIGH (ref 0.1–1.0)
Monocytes Relative: 9 %
Neutro Abs: 15.1 10*3/uL — ABNORMAL HIGH (ref 1.7–7.7)
Neutrophils Relative %: 77 %
Platelets: 380 10*3/uL (ref 150–400)
RBC: 4.68 MIL/uL (ref 4.22–5.81)
RDW: 13.1 % (ref 11.5–15.5)
WBC: 19.4 10*3/uL — ABNORMAL HIGH (ref 4.0–10.5)
nRBC: 0 % (ref 0.0–0.2)

## 2023-08-17 LAB — TROPONIN I (HIGH SENSITIVITY)
Troponin I (High Sensitivity): 13 ng/L (ref <18)
Troponin I (High Sensitivity): 10 ng/L (ref <18)

## 2023-08-17 LAB — RESP PANEL BY RT-PCR (RSV, FLU A&B, COVID)  RVPGX2
Influenza A by PCR: NEGATIVE
Influenza B by PCR: NEGATIVE
Resp Syncytial Virus by PCR: NEGATIVE
SARS Coronavirus 2 by RT PCR: NEGATIVE

## 2023-08-17 LAB — GLUCOSE, CAPILLARY
Glucose-Capillary: 136 mg/dL — ABNORMAL HIGH (ref 70–99)
Glucose-Capillary: 136 mg/dL — ABNORMAL HIGH (ref 70–99)

## 2023-08-17 LAB — LIPASE, BLOOD: Lipase: 26 U/L (ref 11–51)

## 2023-08-17 LAB — LACTIC ACID, PLASMA
Lactic Acid, Venous: 2.5 mmol/L (ref 0.5–1.9)
Lactic Acid, Venous: 2.4 mmol/L (ref 0.5–1.9)

## 2023-08-17 MED ORDER — GALANTAMINE HYDROBROMIDE 4 MG PO TABS
12.0000 mg | ORAL_TABLET | Freq: Two times a day (BID) | ORAL | Status: DC
  Filled 2023-08-17 (×12): qty 3

## 2023-08-17 MED ORDER — ENOXAPARIN SODIUM 60 MG/0.6ML IJ SOSY
50.0000 mg | PREFILLED_SYRINGE | INTRAMUSCULAR | Status: DC
  Administered 2023-08-17 – 2023-08-26 (×10): 50 mg via SUBCUTANEOUS
  Filled 2023-08-17 (×10): qty 0.6

## 2023-08-17 MED ORDER — IOHEXOL 300 MG/ML  SOLN
100.0000 mL | Freq: Once | INTRAMUSCULAR | Status: AC | PRN
  Administered 2023-08-17: 100 mL via INTRAVENOUS

## 2023-08-17 MED ORDER — ONDANSETRON HCL 4 MG/2ML IJ SOLN: 4.0000 mg | Freq: Four times a day (QID) | INTRAMUSCULAR | Status: DC | PRN

## 2023-08-17 MED ORDER — MEMANTINE HCL 5 MG PO TABS: 10.0000 mg | ORAL_TABLET | Freq: Two times a day (BID) | ORAL | Status: DC

## 2023-08-17 MED ORDER — SODIUM CHLORIDE 0.9 % IV SOLN: INTRAVENOUS | Status: DC

## 2023-08-17 MED ORDER — SODIUM CHLORIDE 0.9 % IV SOLN
2.0000 g | INTRAVENOUS | Status: AC
  Administered 2023-08-17 – 2023-08-21 (×5): 2 g via INTRAVENOUS
  Filled 2023-08-17 (×5): qty 20

## 2023-08-17 MED ORDER — ASPIRIN 81 MG PO TBEC: 81.0000 mg | DELAYED_RELEASE_TABLET | Freq: Every day | ORAL | Status: DC

## 2023-08-17 MED ORDER — IPRATROPIUM-ALBUTEROL 0.5-2.5 (3) MG/3ML IN SOLN
3.0000 mL | Freq: Four times a day (QID) | RESPIRATORY_TRACT | Status: DC
  Administered 2023-08-17: 3 mL via RESPIRATORY_TRACT
  Filled 2023-08-17: qty 3

## 2023-08-17 MED ORDER — TAMSULOSIN HCL 0.4 MG PO CAPS: 0.4000 mg | ORAL_CAPSULE | Freq: Every day | ORAL | Status: DC

## 2023-08-17 MED ORDER — METRONIDAZOLE 500 MG/100ML IV SOLN
500.0000 mg | Freq: Once | INTRAVENOUS | Status: AC
  Administered 2023-08-17: 500 mg via INTRAVENOUS
  Filled 2023-08-17: qty 100

## 2023-08-17 MED ORDER — SODIUM CHLORIDE 0.9 % IV SOLN: 2.0000 g | INTRAVENOUS | Status: DC

## 2023-08-17 MED ORDER — IPRATROPIUM-ALBUTEROL 0.5-2.5 (3) MG/3ML IN SOLN
3.0000 mL | Freq: Three times a day (TID) | RESPIRATORY_TRACT | Status: DC
  Administered 2023-08-18: 3 mL via RESPIRATORY_TRACT
  Filled 2023-08-17: qty 3

## 2023-08-17 MED ORDER — ESCITALOPRAM OXALATE 10 MG PO TABS: 5.0000 mg | ORAL_TABLET | Freq: Every day | ORAL | Status: DC

## 2023-08-17 MED ORDER — SODIUM CHLORIDE 0.9 % IV SOLN
2.0000 g | Freq: Once | INTRAVENOUS | Status: AC
  Administered 2023-08-17: 2 g via INTRAVENOUS
  Filled 2023-08-17: qty 12.5

## 2023-08-17 MED ORDER — VANCOMYCIN HCL IN DEXTROSE 1-5 GM/200ML-% IV SOLN: 1000.0000 mg | Freq: Once | INTRAVENOUS | Status: DC

## 2023-08-17 MED ORDER — LACTATED RINGERS IV BOLUS
500.0000 mL | Freq: Once | INTRAVENOUS | Status: AC
  Administered 2023-08-17: 500 mL via INTRAVENOUS

## 2023-08-17 MED ORDER — GUAIFENESIN 100 MG/5ML PO LIQD: 5.0000 mL | ORAL | Status: DC | PRN

## 2023-08-17 MED ORDER — INSULIN ASPART 100 UNIT/ML IJ SOLN
0.0000 [IU] | Freq: Three times a day (TID) | INTRAMUSCULAR | Status: DC
  Administered 2023-08-18 (×3): 2 [IU] via SUBCUTANEOUS
  Filled 2023-08-17 (×3): qty 1

## 2023-08-17 MED ORDER — SODIUM CHLORIDE 0.9 % IV SOLN: 500.0000 mg | INTRAVENOUS | Status: DC

## 2023-08-17 MED ORDER — ONDANSETRON HCL 4 MG PO TABS: 4.0000 mg | ORAL_TABLET | Freq: Four times a day (QID) | ORAL | Status: DC | PRN

## 2023-08-17 MED ORDER — SODIUM CHLORIDE 0.9 % IV SOLN
500.0000 mg | INTRAVENOUS | Status: DC
  Administered 2023-08-18 – 2023-08-21 (×4): 500 mg via INTRAVENOUS
  Filled 2023-08-17 (×5): qty 5

## 2023-08-17 MED ORDER — ATORVASTATIN CALCIUM 20 MG PO TABS: 20.0000 mg | ORAL_TABLET | Freq: Every day | ORAL | Status: DC

## 2023-08-17 MED ORDER — CLOPIDOGREL BISULFATE 75 MG PO TABS: 75.0000 mg | ORAL_TABLET | Freq: Every day | ORAL | Status: DC

## 2023-08-17 MED ORDER — LACTATED RINGERS IV BOLUS (SEPSIS)
1000.0000 mL | Freq: Once | INTRAVENOUS | Status: AC
  Administered 2023-08-17: 1000 mL via INTRAVENOUS

## 2023-08-17 MED ORDER — VANCOMYCIN HCL 2000 MG/400ML IV SOLN
2000.0000 mg | Freq: Once | INTRAVENOUS | Status: AC
  Administered 2023-08-17: 2000 mg via INTRAVENOUS
  Filled 2023-08-17: qty 400

## 2023-08-17 NOTE — Progress Notes (Signed)
 SLP Cancellation Note  Patient Details Name: PHILO KURTZ MRN: [id number] DOB: 1945/08/23   Cancelled treatment:       Reason Eval/Treat Not Completed: Patient not medically ready;Patient's level of consciousness (chart reviewed; consulted NSG then met w/ Family in rrom.)  Per chart and Family, at baseline, patient is nonverbal, due to Advanced Dementia, he needs to be fed for his meals. Wife and Daughter at bedside reported patient has no history of aspiration before and does not choke or cough after eat or drink. They noted something was "different" last night and this morning when he did not want to eat and was sleepy. Recent  recent cholecystitis status post cholecystostomy; Wife noting that the output from the biliary drain is darker than usual. Pt has PMH including Obesity, CAD, chronic HFrEF with LVEF 45-50%.  Pt is not responsive at this time to max stim given; recently responded only to Pain per NSG discussion.  Pt is not alert/appropriate for BSE. Recommend continued strict NPO including meds. ST services will f/u tomorrow for appropriateness for safe participation in BSE.  Discussed offering oral care when appropriate; general discussion on pt's aspiration precautions at this time. Wife, Daughter fully agreed.      Darla Edward, MS, CCC-SLP Speech Language Pathologist Rehab Services; [facility] [phone] (ascom) Sharmila Wrobleski 08/17/2023, 2:47 PM

## 2023-08-17 NOTE — Progress Notes (Signed)
 MEWS Progress Note  Patient Details Name: Jeff Key MRN: [id number] DOB: 09/03/1945 Today's Date: 08/17/2023   MEWS Flowsheet Documentation:  Assess: MEWS Score Temp: 100 F (37.8 C) BP: (!) 137/96 MAP (mmHg): 108 Pulse Rate: (!) 102 ECG Heart Rate: 96 Resp: 17 Level of Consciousness: Responds to Pain SpO2: 91 % O2 Device: Room Air Assess: MEWS Score MEWS Temp: 0 MEWS Systolic: 0 MEWS Pulse: 1 MEWS RR: 0 MEWS LOC: 2 MEWS Score: 3 MEWS Score Color: Yellow Assess: SIRS CRITERIA SIRS Temperature : 0 SIRS Respirations : 0 SIRS Pulse: 1 SIRS WBC: 0 SIRS Score Sum : 1 Assess: if the MEWS score is Yellow or Red Were vital signs accurate and taken at a resting state?: Yes Does the patient meet 2 or more of the SIRS criteria?: No MEWS guidelines implemented : Yes, yellow Treat MEWS Interventions: Considered administering scheduled or prn medications/treatments as ordered Take Vital Signs Increase Vital Sign Frequency : Yellow: Q2hr x1, continue Q4hrs until patient remains green for 12hrs Escalate MEWS: Escalate: Yellow: Discuss with charge nurse and consider notifying provider and/or RRT Notify: Charge Nurse/RN Name of Charge Nurse/RN Notified: Location manager Name/Title: B. Boston Byers NP Date Provider Notified: 08/17/23 Time Provider Notified: 2025 Method of Notification:  (secure chat) Notification Reason: Other (Comment) (yellow mews 2/2 mild tachycardia 102 and LOC (unchanged from time of admission)) Provider response: Evaluate remotely, No new orders      [employer] 08/17/2023, 8:32 PM

## 2023-08-17 NOTE — ED Provider Notes (Addendum)
 [facility] Provider Note    Event Date/Time   First MD Initiated Contact with Patient 08/17/23 1020     (approximate)   History   Altered Mental Status   HPI  Jeff Key is a 78 y.o. male with history of dementia, CAD, history of ulcerative colitis, presenting with generalized weakness, and altered mental status.  Wife had noted that the output from the biliary drain is darker than usual.  Per independent history from EMS, usually wakes up to eat but today was harder to arouse, refusing food.  No fevers at home, was satting 93% on room air for them.  Per independent chart review, he was admitted in April for acute cholecystitis and sepsis, is nonverbal at baseline.  Coming from home.  Had CT done that showed pelvic abscesses, treated with antibiotics.  Had a PERC Coley drain placed.     Physical Exam   Triage Vital Signs: ED Triage Vitals  Encounter Vitals Group     BP      Systolic BP Percentile      Diastolic BP Percentile      Pulse      Resp      Temp      Temp src      SpO2      Weight      Height      Head Circumference      Peak Flow      Pain Score      Pain Loc      Pain Education      Exclude from Growth Chart     Most recent vital signs: Vitals:   08/17/23 1200 08/17/23 1300  BP: (!) 142/89 (!) 147/94  Pulse: (!) 105 97  Resp: 19 (!) 25  Temp:    SpO2: 97% 100%     General: Sleeping, no distress.  CV:  Good peripheral perfusion.  Resp:  Normal effort.  Tachypneic, rhonchorous bilaterally Abd:  No distention.  Soft, PERC Coley drain on the right, drain insertion site without any overlying erythema or swelling or ecchymoses, biliary drain shows dark green fluid. Other:  No lower extremity edema, no unilateral calf swelling or tenderness   ED Results / Procedures / Treatments   Labs (all labs ordered are listed, but only abnormal results are displayed) Labs Reviewed  CBC WITH DIFFERENTIAL/PLATELET -  Abnormal; Notable for the following components:      Result Value   WBC 19.4 (*)    Neutro Abs 15.1 (*)    Monocytes Absolute 1.7 (*)    Abs Immature Granulocytes 0.15 (*)    All other components within normal limits  LACTIC ACID, PLASMA - Abnormal; Notable for the following components:   Lactic Acid, Venous 2.5 (*)    All other components within normal limits  LACTIC ACID, PLASMA - Abnormal; Notable for the following components:   Lactic Acid, Venous 2.4 (*)    All other components within normal limits  URINALYSIS, W/ REFLEX TO CULTURE (INFECTION SUSPECTED) - Abnormal; Notable for the following components:   Color, Urine YELLOW (*)    APPearance CLEAR (*)    All other components within normal limits  COMPREHENSIVE METABOLIC PANEL WITH GFR - Abnormal; Notable for the following components:   Glucose, Bld 177 (*)    AST 44 (*)    All other components within normal limits  CULTURE, BLOOD (ROUTINE X 2)  CULTURE, BLOOD (ROUTINE X 2)  RESP PANEL  BY RT-PCR (RSV, FLU A&B, COVID)  [id number]  AMMONIA  PROTIME-INR  LIPASE, BLOOD  TROPONIN I (HIGH SENSITIVITY)  TROPONIN I (HIGH SENSITIVITY)     EKG  EKG shows, sinus tachycardia, rate 113, normal QRS, normal QTc, T wave flattening in 1, aVL, V2, no ischemic ST elevation, not significantly compared to prior   RADIOLOGY On my independent interpretation, CT head without obvious intracranial hemorrhage   PROCEDURES:  Critical Care performed: Yes, see critical care procedure note(s)  .Critical Care  Performed by: Shane Darling, MD Authorized by: Shane Darling, MD   Critical care provider statement:    Critical care time (minutes):  35   Critical care was necessary to treat or prevent imminent or life-threatening deterioration of the following conditions:  Sepsis   Critical care was time spent personally by me on the following activities:  Development of treatment plan with patient or surrogate, discussions with consultants, evaluation of  patient's response to treatment, examination of patient, ordering and review of laboratory studies, ordering and review of radiographic studies, ordering and performing treatments and interventions, pulse oximetry, re-evaluation of patient's condition and review of old charts    MEDICATIONS ORDERED IN ED: Medications  vancomycin  (VANCOREADY) IVPB 2000 mg/400 mL (has no administration in time range)  lactated ringers  bolus 500 mL (0 mLs Intravenous Stopped 08/17/23 1218)  lactated ringers  bolus 1,000 mL (0 mLs Intravenous Stopped 08/17/23 1331)  ceFEPIme  (MAXIPIME ) 2 g in sodium chloride  0.9 % 100 mL IVPB (0 g Intravenous Stopped 08/17/23 1218)  metroNIDAZOLE  (FLAGYL ) IVPB 500 mg (500 mg Intravenous New Bag/Given 08/17/23 1223)  iohexol  (OMNIPAQUE ) 300 MG/ML solution 100 mL (100 mLs Intravenous Contrast Given 08/17/23 1145)     IMPRESSION / MDM / ASSESSMENT AND PLAN / ED COURSE  I reviewed the triage vital signs and the nursing notes.                              Differential diagnosis includes, but is not limited to, early sepsis, cholecystitis, abscess, pneumonia, viral illness, electrolyte derangements, dehydration, metabolic encephalopathy.  Atypical ACS.  Get labs, EKG, troponin, chest x-ray, CT head, CT abdomen pelvis.  Will give some fluids here.  He will likely need to be admitted for further management.  Patient's presentation is most consistent with acute presentation with potential threat to life or bodily function.  Independent interpretation of labs and imaging below.  Chest x-ray showed opacities concerning for pneumonia, given his rhonchi and tachypnea, suspect this is the source.  CT imaging is still pending.  Given the sepsis as well as pneumonia, he will need to be admitted for further management.  Consult the hospitalist was agreeable with the plan for admission and will evaluate the patient.  He is admitted.  The patient is on the cardiac monitor to evaluate for evidence of  arrhythmia and/or significant heart rate changes.   Clinical Course as of 08/17/23 1331  Fri Aug 17, 2023  1232 CT Head Wo Contrast IMPRESSION: 1. No evidence of an acute intracranial abnormality. 2. Unchanged foci of chronic encephalomalacia/gliosis within the anterolateral left frontal lobe and anterior left temporal lobe, which are likely posttraumatic in etiology. 3. Moderate-to-advanced cerebral white matter chronic small vessel ischemic disease. 4. Advanced cerebral atrophy.   [TT]  1232 DG Chest 1 View IMPRESSION: Low lung volumes. Retrocardiac airspace opacities, may represent atelectasis or a developing bronchopneumonia, in the correct clinical context.   [TT]  1331 Independent review of labs, troponin is normal, lactate is elevated, UA is not consistent with UTI, electrolytes not severely deranged, he does have a leukocytosis. [TT]    Clinical Course User Index [TT] Drenda Gentle Richard Champion, MD     FINAL CLINICAL IMPRESSION(S) / ED DIAGNOSES   Final diagnoses:  Altered mental status, unspecified altered mental status type  Sepsis, due to unspecified organism, unspecified whether acute organ dysfunction present (HCC)  Pneumonia due to infectious organism, unspecified laterality, unspecified part of lung     Rx / DC Orders   ED Discharge Orders     None        Note:  This document was prepared using Dragon voice recognition software and may include unintentional dictation errors.    Shane Darling, MD 08/17/23 1330    Shane Darling, MD 08/17/23 [phone]

## 2023-08-17 NOTE — ED Triage Notes (Signed)
 Pt BIB [facility] from home c/o new onset of altered mental status/ weakness. Pt has alzheimer at baseline and is nonverbal at baseline. Caregiver stated per EMS that pt usually wakes up to eat but is hard to arouse and wont eat. Pt has Hx of a "bad gallbladder" and has had sepsis x3.  149/90 93 RA 30 co2 98.7 oral temp

## 2023-08-17 NOTE — ED Notes (Signed)
 Pt to CT

## 2023-08-17 NOTE — H&P (Signed)
 History and Physical    Jeff Key:[id number] DOB: [birth year] DOA: 08/17/2023  PCP: Antonio Baumgarten, MD (Confirm with patient/family/NH records and if not entered, this has to be entered at [facility] point of entry) Patient coming from: Home  I have personally briefly reviewed patient's old medical records in [facility]  Chief Complaint: AMS  HPI: Jeff Key is a 78 y.o. 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  At baseline, patient is nonverbal, due to advanced dementia, he needs to be fed for his meals.  Family member including wife and daughter at bedside reported patient has no history of aspiration before and does not choke or cough after eat or drink.  Yesterday evening after dinner however patient started to be sleepy and breathing heavily, gurgly in her throat but has no cough.  This morning, family again found the patient is sleepy and hard of loss and called EMS.  Patient has a recent cholecystitis status post cholecystostomy insertion on recent hospitalization and wife reported no significant change of output or color of the cholecystotomy recently.  No diarrhea.  But patient has been constipated for 3 to 4 days and wife has tried p.o. Colace as well as OTC enema yesterday without significant issue.  No vomiting as per family  ED Course: Low-grade fever 91.1 borderline tachycardia blood pressure 140/90 O2 saturation 97% on room air.  Chest x-ray suspicious for left lower lobe pneumonia, CT abdomen pelvis showed left lower lobe infiltrates versus atelectasis.  Blood work showed WBC 19, hemoglobin 13 TSH 2.5> 2.4, BUN 15 creatinine 0.8.  CT head negative for acute findings.  Patient was given cefepime  and vancomycin  in  the ED.  Review of Systems: Unable to perform, patient is lethargic and hard to arouse.  Past Medical History:  Diagnosis Date   Anemia ~ 11/2015   CAD (coronary artery disease)    a. inf STEMI on 02/10/16. RCA treated with DES followed by staged PCI of LAD and Circumflex 02/14/16.   Dementia (HCC) dx'd 01/2016   GERD (gastroesophageal reflux disease)    History of stomach ulcers 1980s?   Ischemic cardiomyopathy    a. 01/2016: EF 40-45%.     Past Surgical History:  Procedure Laterality Date   CARDIAC CATHETERIZATION N/A 02/10/2016   Procedure: Left Heart Cath and Coronary Angiography;  Surgeon: Avanell Leigh, MD;  Location: [facility] INVASIVE CV LAB;  Service: Cardiovascular;  Laterality: N/A;   CARDIAC CATHETERIZATION N/A 02/10/2016   Procedure: Coronary Stent Intervention;  Surgeon: Avanell Leigh, MD;  Location: [facility] INVASIVE CV LAB;  Service: Cardiovascular;  Laterality: N/A;   CARDIAC CATHETERIZATION N/A 02/14/2016   Procedure: Coronary Stent Intervention-LAD and CFX;  Surgeon: Avanell Leigh, MD;  Location: [facility] INVASIVE CV LAB;  Service: Cardiovascular;  Laterality: N/A;   COLONOSCOPY WITH PROPOFOL  N/A 05/06/2015   Procedure: COLONOSCOPY WITH PROPOFOL ;  Surgeon: Stephens Eis, MD;  Location: [facility] ENDOSCOPY;  Service: Gastroenterology;  Laterality: N/A;   CORONARY ANGIOPLASTY WITH STENT PLACEMENT  02/14/2016   "2 stents"   ESOPHAGOGASTRODUODENOSCOPY (EGD) WITH PROPOFOL  N/A 05/06/2015   Procedure: ESOPHAGOGASTRODUODENOSCOPY (EGD) WITH PROPOFOL ;  Surgeon: Stephens Eis, MD;  Location: [facility] ENDOSCOPY;  Service: Gastroenterology;  Laterality: N/A;   FOREARM FRACTURE SURGERY Right 1985   "got it tore up in [place]"  FRACTURE SURGERY     IR PERC CHOLECYSTOSTOMY  07/05/2023     reports that he quit smoking about 35 years ago. His smoking use included cigarettes. He started smoking about 65 years ago. He has a 60 pack-year smoking history. He has never used smokeless tobacco. He reports that he  does not drink alcohol  and does not use drugs.  No Known Allergies  Family History  Problem Relation Age of Onset   Prostate cancer Father    Bladder Cancer Neg Hx    Kidney cancer Neg Hx      Prior to Admission medications   Medication Sig Start Date End Date Taking? Authorizing Provider  aspirin  EC 81 MG tablet Take 81 mg by mouth daily.    [provider]  atorvastatin  (LIPITOR ) 20 MG tablet Take 1 tablet (20 mg total) by mouth daily. 11/21/22   Avanell Leigh, MD  balsalazide (COLAZAL ) 750 MG capsule Take 2,250 mg by mouth 3 (three) times daily. 09/23/19   [provider]  clopidogrel  (PLAVIX ) 75 MG tablet Take 1 tablet (75 mg total) by mouth daily. 11/21/22   Avanell Leigh, MD  escitalopram  (LEXAPRO ) 5 MG tablet Take 5 mg by mouth daily. 08/29/19   [provider]  ferrous sulfate  325 (65 FE) MG tablet Take 325 mg by mouth daily with breakfast.    [provider]  galantamine  (RAZADYNE ) 12 MG tablet Take 12 mg by mouth 2 (two) times daily with a meal. 09/02/19   [provider]  lisinopril  (ZESTRIL ) 10 MG tablet Take 1 tablet (10 mg total) by mouth daily. 11/21/22   Avanell Leigh, MD  memantine  (NAMENDA ) 10 MG tablet Take 10 mg by mouth 2 (two) times daily. 08/07/19   [provider]  metFORMIN (GLUCOPHAGE) 500 MG tablet Take 500 mg by mouth 2 (two) times daily with a meal.    [provider]  nitroGLYCERIN  (NITROSTAT ) 0.4 MG SL tablet Place 1 tablet (0.4 mg total) under the tongue every 5 (five) minutes as needed for chest pain. 02/15/16   Ardia Kraft, PA-C  sodium chloride  flush (NS) 0.9 % SOLN 5 mLs by Intracatheter route every 8 (eight) hours. 07/12/23   Wouk, Haynes Lips, MD  sodium chloride  flush 0.9 % SOLN injection 5 mLs by Intracatheter route daily. 07/12/23   Huneycutt, [place], NP  tamsulosin  (FLOMAX ) 0.4 MG CAPS capsule Take 1 capsule (0.4 mg total) by mouth daily. 07/13/23   Wouk, Haynes Lips, MD   vitamin B-12 (CYANOCOBALAMIN ) 1000 MCG tablet Take 1,000 mcg by mouth daily.    [provider]    Physical Exam: Vitals:   08/17/23 1025 08/17/23 1030 08/17/23 1200 08/17/23 1300  BP:  (!) 126/95 (!) 142/89 (!) 147/94  Pulse:  (!) 116 (!) 105 97  Resp:  (!) 27 19 (!) 25  Temp: 99.1 F (37.3 C)     TempSrc: Rectal     SpO2:  91% 97% 100%  Weight:      Height:        Constitutional: NAD, calm, comfortable Vitals:   08/17/23 1025 08/17/23 1030 08/17/23 1200 08/17/23 1300  BP:  (!) 126/95 (!) 142/89 (!) 147/94  Pulse:  (!) 116 (!) 105 97  Resp:  (!) 27 19 (!) 25  Temp: 99.1 F (37.3 C)     TempSrc: Rectal     SpO2:  91% 97% 100%  Weight:      Height:  Eyes: PERRL, lids and conjunctivae normal ENMT: Mucous membranes are moist. Posterior pharynx clear of any exudate or lesions.Normal dentition.  Neck: normal, supple, no masses, no thyromegaly Respiratory: clear to auscultation bilaterally, no wheezing, left-sided crackles, increasing respiratory effort. No accessory muscle use.  Cardiovascular: Regular rate and rhythm, no murmurs / rubs / gallops. No extremity edema. 2+ pedal pulses. No carotid bruits.  Abdomen: no tenderness, no masses palpated. No hepatosplenomegaly. Bowel sounds positive.  Musculoskeletal: no clubbing / cyanosis. No joint deformity upper and lower extremities. Good ROM, no contractures. Normal muscle tone.  Skin: no rashes, lesions, ulcers. No induration Neurologic: No facial droops, moving all limbs, Psychiatric: Lethargic sleepy, arousable to physical stimuli, appears to be confused    Labs on Admission: I have personally reviewed following labs and imaging studies  CBC: Recent Labs  Lab 08/17/23 1030  WBC 19.4*  NEUTROABS 15.1*  HGB 13.3  HCT 40.7  MCV 87.0  PLT 380   Basic Metabolic Panel: Recent Labs  Lab 08/17/23 1030  NA 135  K 4.7  CL 99  CO2 25  GLUCOSE 177*  BUN 15  CREATININE 0.86  CALCIUM  9.0    GFR: Estimated Creatinine Clearance: 84.2 mL/min (by C-G formula based on SCr of 0.86 mg/dL). Liver Function Tests: Recent Labs  Lab 08/17/23 1030  AST 44*  ALT 25  ALKPHOS 84  BILITOT 1.1  PROT 8.1  ALBUMIN 3.6   Recent Labs  Lab 08/17/23 1030  LIPASE 26   Recent Labs  Lab 08/17/23 1030  AMMONIA 23   Coagulation Profile: Recent Labs  Lab 08/17/23 1219  INR 1.1   Cardiac Enzymes: No results for input(s): "CKTOTAL", "CKMB", "CKMBINDEX", "TROPONINI" in the last 168 hours. BNP (last 3 results) No results for input(s): "PROBNP" in the last 8760 hours. HbA1C: No results for input(s): "HGBA1C" in the last 72 hours. CBG: No results for input(s): "GLUCAP" in the last 168 hours. Lipid Profile: No results for input(s): "CHOL", "HDL", "LDLCALC", "TRIG", "CHOLHDL", "LDLDIRECT" in the last 72 hours. Thyroid  Function Tests: No results for input(s): "TSH", "T4TOTAL", "FREET4", "T3FREE", "THYROIDAB" in the last 72 hours. Anemia Panel: No results for input(s): "VITAMINB12", "FOLATE", "FERRITIN", "TIBC", "IRON", "RETICCTPCT" in the last 72 hours. Urine analysis:    Component Value Date/Time   COLORURINE YELLOW (A) 08/17/2023 1030   APPEARANCEUR CLEAR (A) 08/17/2023 1030   APPEARANCEUR Clear 10/16/2014 1539   LABSPEC 1.028 08/17/2023 1030   PHURINE 5.0 08/17/2023 1030   GLUCOSEU NEGATIVE 08/17/2023 1030   HGBUR NEGATIVE 08/17/2023 1030   BILIRUBINUR NEGATIVE 08/17/2023 1030   BILIRUBINUR Negative 10/16/2014 1539   KETONESUR NEGATIVE 08/17/2023 1030   PROTEINUR NEGATIVE 08/17/2023 1030   NITRITE NEGATIVE 08/17/2023 1030   LEUKOCYTESUR NEGATIVE 08/17/2023 1030    Radiological Exams on Admission: CT ABDOMEN PELVIS W CONTRAST Result Date: 08/17/2023 CLINICAL DATA:  Is biliary drain biliary drain with increased output. EXAM: CT ABDOMEN AND PELVIS WITH CONTRAST TECHNIQUE: Multidetector CT imaging of the abdomen and pelvis was performed using the standard protocol following  bolus administration of intravenous contrast. RADIATION DOSE REDUCTION: This exam was performed according to the departmental dose-optimization program which includes automated exposure control, adjustment of the mA and/or kV according to patient size and/or use of iterative reconstruction technique. CONTRAST:  OMNIPAQUE  IOHEXOL  300 MG/ML  SOLN COMPARISON:  CT 07/10/2023 FINDINGS: Lower chest: Rounded pleuroparenchymal thickening in the LEFT lower lobe slightly increased from comparison exam. Hepatobiliary: Percutaneous drainage catheter with tip coiled within the lumen of  the gall bladder. No fluid collections associated with the gallbladder fossa. The gallbladder is decompressed. No biliary duct dilatation. Pancreas: No pancreatic inflammation. No pancreatic fluid collections. Spleen: Normal spleen Adrenals/urinary tract: Adrenal glands and kidneys are normal. The ureters and bladder normal. There several dependent calcifications in the LEFT aspect of the bladder (image 83/3) Stomach/Bowel: Stomach, small bowel, appendix, and cecum are normal. The colon and rectosigmoid colon are normal. Vascular/Lymphatic: Is Abdominal aorta is normal caliber with atherosclerotic calcification. There is no retroperitoneal or periportal lymphadenopathy. No pelvic lymphadenopathy. Reproductive: Prostate unremarkable Other: No organized fluid collections in the abdomen pelvis. Musculoskeletal: No aggressive osseous lesion. IMPRESSION: 1. No evidence of biloma or biliary leak. 2. Cholecystostomy tube in place. No fluid collections associated with the gallbladder fossa. Gallbladder is decompressed. 3. No biliary duct dilatation. 4. Rounded pleuroparenchymal thickening in the LEFT lower lobe slightly increased from comparison exam. Findings suggest rounded pneumonia versus rounded atelectasis. 5.  Aortic Atherosclerosis (ICD10-I70.0). Electronically Signed   By: Deboraha Fallow M.D.   On: 08/17/2023 13:53   CT Head Wo  Contrast Result Date: 08/17/2023 CLINICAL DATA:  Provided history: Mental status change, unknown cause. New onset altered mental status and weakness. History of Alzheimer's disease. EXAM: CT HEAD WITHOUT CONTRAST TECHNIQUE: Contiguous axial images were obtained from the base of the skull through the vertex without intravenous contrast. RADIATION DOSE REDUCTION: This exam was performed according to the departmental dose-optimization program which includes automated exposure control, adjustment of the mA and/or kV according to patient size and/or use of iterative reconstruction technique. COMPARISON:  Head CT 11/16/2021. FINDINGS: Brain: Advanced cerebral atrophy. Commensurate prominence of the ventricles and sulci. Foci of chronic encephalomalacia/gliosis again demonstrated within the anterolateral left frontal lobe and anterior left temporal lobe. Patchy and ill-defined hypoattenuation within the cerebral white matter, nonspecific but compatible with moderate-to-advanced chronic small vessel ischemic disease. There is no acute intracranial hemorrhage. No demarcated cortical infarct. No extra-axial fluid collection. No evidence of an intracranial mass. No midline shift. Vascular: No hyperdense vessel.  Atherosclerotic calcifications. Skull: No calvarial fracture or aggressive osseous lesion. Sinuses/Orbits: No mass or acute finding within the imaged orbits. Mild mucosal thickening within the left maxillary and bilateral ethmoid sinuses at the imaged levels. IMPRESSION: 1. No evidence of an acute intracranial abnormality. 2. Unchanged foci of chronic encephalomalacia/gliosis within the anterolateral left frontal lobe and anterior left temporal lobe, which are likely posttraumatic in etiology. 3. Moderate-to-advanced cerebral white matter chronic small vessel ischemic disease. 4. Advanced cerebral atrophy. Electronically Signed   By: Bascom Lily D.O.   On: 08/17/2023 12:23   DG Chest 1 View Result Date:  08/17/2023 CLINICAL DATA:  weakness , Alzheimer's EXAM: CHEST  1 VIEW COMPARISON:  None available. FINDINGS: Low lung volumes. Retrocardiac airspace opacities. No pleural effusion or pneumothorax. The cardiac silhouette is at the upper limits of normal, likely accentuated by AP technique and low lung volumes. Tortuous aorta with aortic atherosclerosis. No acute fracture or destructive lesions. Multilevel thoracic osteophytosis. IMPRESSION: Low lung volumes. Retrocardiac airspace opacities, may represent atelectasis or a developing bronchopneumonia, in the correct clinical context. Electronically Signed   By: Rance Burrows M.D.   On: 08/17/2023 11:52    EKG: Independently reviewed.  Sinus tachycardia, no acute ST changes.  Assessment/Plan Principal Problem:   CAP (community acquired pneumonia) Active Problems:   Acute metabolic encephalopathy  (please populate well all problems here in Problem List. (For example, if patient is on BP meds at home and you resume or  decide to hold them, it is a problem that needs to be her. Same for CAD, COPD, HLD and so on)   Sepsis with acute endorgan damage Acute metabolic encephalopathy CAP bacterial, LLL - Sepsis evidenced by elevated leukocytosis, elevated lactic acid level, with signs of endorgan damage of acute metabolic encephalopathy, source infection likely left lower lobe pneumonia. - Continue CAP coverage with ceftriaxone  and azithromycin , appears to have low aspiration risk as per family.  For now we will keep patient n.p.o. until mentation improvement.  Consult speech therapist tomorrow. - N.p.o., IV fluid  Severe constipation, failed outpatient management - Enema x 1 - As needed laxative and stool softener when able to take p.o.  History of ischemic cardiomyopathy Chronic HFrEF - Patient is n.p.o., will start maintenance IV fluid with gentle hydration 75 ml per hour - Hold off home BP meds - As needed hydralazine   IIDM -SSI  Obesity -  BMI= 34 - Recommend calorie restriction  Advanced dementia - Hold off memantine  today  Deconditioning - PT evaluation  DVT prophylaxis: Lovenox  Code Status: Full code Family Communication: Wife and daughter at bedside Disposition Plan: Expect less than 2 midnight hospital stay Consults called: None Admission status: Telemetry observation   [place] MD [facility] Pager [phone]  08/17/2023, 2:06 PM

## 2023-08-17 NOTE — ED Notes (Signed)
RN aware bed assigned ?

## 2023-08-17 NOTE — Progress Notes (Signed)
 Elink following for sepsis protocol.

## 2023-08-17 NOTE — Consult Note (Signed)
 CODE SEPSIS - PHARMACY COMMUNICATION  **Broad Spectrum Antibiotics should be administered within 1 hour of Sepsis diagnosis**  Time Code Sepsis Called/Page Received: 1129  Antibiotics Ordered: cefepime , vancomycin , Flagyl   Time of 1st antibiotic administration: 1142  Additional action taken by pharmacy: N/A  Ramonita Burow ,PharmD Clinical Pharmacist  08/17/2023  11:37 AM

## 2023-08-18 DIAGNOSIS — E119 Type 2 diabetes mellitus without complications: Secondary | ICD-10-CM | POA: Diagnosis not present

## 2023-08-18 DIAGNOSIS — B369 Superficial mycosis, unspecified: Secondary | ICD-10-CM | POA: Diagnosis not present

## 2023-08-18 DIAGNOSIS — R29898 Other symptoms and signs involving the musculoskeletal system: Secondary | ICD-10-CM | POA: Diagnosis not present

## 2023-08-18 DIAGNOSIS — G9341 Metabolic encephalopathy: Secondary | ICD-10-CM

## 2023-08-18 DIAGNOSIS — E785 Hyperlipidemia, unspecified: Secondary | ICD-10-CM | POA: Diagnosis not present

## 2023-08-18 DIAGNOSIS — F0280 Dementia in other diseases classified elsewhere without behavioral disturbance: Secondary | ICD-10-CM | POA: Diagnosis not present

## 2023-08-18 DIAGNOSIS — Z515 Encounter for palliative care: Secondary | ICD-10-CM | POA: Diagnosis not present

## 2023-08-18 DIAGNOSIS — E876 Hypokalemia: Secondary | ICD-10-CM | POA: Diagnosis not present

## 2023-08-18 DIAGNOSIS — G309 Alzheimer's disease, unspecified: Secondary | ICD-10-CM | POA: Diagnosis not present

## 2023-08-18 DIAGNOSIS — A419 Sepsis, unspecified organism: Secondary | ICD-10-CM | POA: Diagnosis not present

## 2023-08-18 DIAGNOSIS — I5022 Chronic systolic (congestive) heart failure: Secondary | ICD-10-CM | POA: Diagnosis not present

## 2023-08-18 DIAGNOSIS — Z794 Long term (current) use of insulin: Secondary | ICD-10-CM | POA: Diagnosis not present

## 2023-08-18 DIAGNOSIS — K5190 Ulcerative colitis, unspecified, without complications: Secondary | ICD-10-CM | POA: Diagnosis not present

## 2023-08-18 DIAGNOSIS — Z1152 Encounter for screening for COVID-19: Secondary | ICD-10-CM | POA: Diagnosis not present

## 2023-08-18 DIAGNOSIS — J189 Pneumonia, unspecified organism: Secondary | ICD-10-CM

## 2023-08-18 DIAGNOSIS — L89891 Pressure ulcer of other site, stage 1: Secondary | ICD-10-CM | POA: Diagnosis not present

## 2023-08-18 DIAGNOSIS — I255 Ischemic cardiomyopathy: Secondary | ICD-10-CM | POA: Diagnosis not present

## 2023-08-18 DIAGNOSIS — R638 Other symptoms and signs concerning food and fluid intake: Secondary | ICD-10-CM | POA: Diagnosis not present

## 2023-08-18 DIAGNOSIS — K810 Acute cholecystitis: Secondary | ICD-10-CM | POA: Diagnosis not present

## 2023-08-18 DIAGNOSIS — Z7902 Long term (current) use of antithrombotics/antiplatelets: Secondary | ICD-10-CM | POA: Diagnosis not present

## 2023-08-18 DIAGNOSIS — Z7189 Other specified counseling: Secondary | ICD-10-CM | POA: Diagnosis not present

## 2023-08-18 DIAGNOSIS — Z66 Do not resuscitate: Secondary | ICD-10-CM | POA: Diagnosis not present

## 2023-08-18 DIAGNOSIS — J181 Lobar pneumonia, unspecified organism: Secondary | ICD-10-CM | POA: Diagnosis not present

## 2023-08-18 DIAGNOSIS — R6520 Severe sepsis without septic shock: Secondary | ICD-10-CM | POA: Diagnosis not present

## 2023-08-18 DIAGNOSIS — Z7401 Bed confinement status: Secondary | ICD-10-CM | POA: Diagnosis not present

## 2023-08-18 DIAGNOSIS — R4182 Altered mental status, unspecified: Secondary | ICD-10-CM | POA: Diagnosis not present

## 2023-08-18 DIAGNOSIS — E669 Obesity, unspecified: Secondary | ICD-10-CM | POA: Diagnosis not present

## 2023-08-18 DIAGNOSIS — E872 Acidosis, unspecified: Secondary | ICD-10-CM | POA: Diagnosis not present

## 2023-08-18 DIAGNOSIS — I2510 Atherosclerotic heart disease of native coronary artery without angina pectoris: Secondary | ICD-10-CM | POA: Diagnosis not present

## 2023-08-18 DIAGNOSIS — Z7982 Long term (current) use of aspirin: Secondary | ICD-10-CM | POA: Diagnosis not present

## 2023-08-18 LAB — BASIC METABOLIC PANEL WITH GFR
Anion gap: 10 (ref 5–15)
BUN: 13 mg/dL (ref 8–23)
CO2: 23 mmol/L (ref 22–32)
Calcium: 8.6 mg/dL — ABNORMAL LOW (ref 8.9–10.3)
Chloride: 103 mmol/L (ref 98–111)
Creatinine, Ser: 0.77 mg/dL (ref 0.61–1.24)
GFR, Estimated: >60 mL/min (ref >60)
Glucose, Bld: 154 mg/dL — ABNORMAL HIGH (ref 70–99)
Potassium: 3.9 mmol/L (ref 3.5–5.1)
Sodium: 136 mmol/L (ref 135–145)

## 2023-08-18 LAB — STREP PNEUMONIAE URINARY ANTIGEN: Strep Pneumo Urinary Antigen: NEGATIVE

## 2023-08-18 LAB — CBC
HCT: 35.9 % — ABNORMAL LOW (ref 39.0–52.0)
Hemoglobin: 11.4 g/dL — ABNORMAL LOW (ref 13.0–17.0)
MCH: 27.9 pg (ref 26.0–34.0)
MCHC: 31.8 g/dL (ref 30.0–36.0)
MCV: 87.8 fL (ref 80.0–100.0)
Platelets: 298 10*3/uL (ref 150–400)
RBC: 4.09 MIL/uL — ABNORMAL LOW (ref 4.22–5.81)
RDW: 13.2 % (ref 11.5–15.5)
WBC: 18 10*3/uL — ABNORMAL HIGH (ref 4.0–10.5)
nRBC: 0 % (ref 0.0–0.2)

## 2023-08-18 LAB — GLUCOSE, CAPILLARY
Glucose-Capillary: 152 mg/dL — ABNORMAL HIGH (ref 70–99)
Glucose-Capillary: 147 mg/dL — ABNORMAL HIGH (ref 70–99)
Glucose-Capillary: 143 mg/dL — ABNORMAL HIGH (ref 70–99)
Glucose-Capillary: 130 mg/dL — ABNORMAL HIGH (ref 70–99)
Glucose-Capillary: 110 mg/dL — ABNORMAL HIGH (ref 70–99)

## 2023-08-18 MED ORDER — ACETAMINOPHEN 650 MG RE SUPP
650.0000 mg | RECTAL | Status: DC | PRN
  Administered 2023-08-18: 650 mg via RECTAL
  Filled 2023-08-18: qty 1

## 2023-08-18 MED ORDER — SODIUM CHLORIDE 0.9 % IR SOLN: 10.0000 mL | Freq: Two times a day (BID) | Status: DC

## 2023-08-18 MED ORDER — SODIUM CHLORIDE 0.9% FLUSH
10.0000 mL | Freq: Two times a day (BID) | INTRAVENOUS | Status: DC
  Administered 2023-08-19 – 2023-08-27 (×15): 10 mL

## 2023-08-18 MED ORDER — CHLORHEXIDINE GLUCONATE CLOTH 2 % EX PADS
6.0000 | MEDICATED_PAD | Freq: Every day | CUTANEOUS | Status: DC
  Administered 2023-08-18 – 2023-08-27 (×10): 6 via TOPICAL

## 2023-08-18 MED ORDER — IPRATROPIUM-ALBUTEROL 0.5-2.5 (3) MG/3ML IN SOLN
3.0000 mL | RESPIRATORY_TRACT | Status: DC | PRN
Start: 2023-08-18 — End: 2023-08-27

## 2023-08-18 MED ORDER — SENNOSIDES-DOCUSATE SODIUM 8.6-50 MG PO TABS: 1.0000 | ORAL_TABLET | Freq: Two times a day (BID) | ORAL | Status: DC

## 2023-08-18 MED ORDER — INSULIN ASPART 100 UNIT/ML IJ SOLN
0.0000 [IU] | INTRAMUSCULAR | Status: DC
  Administered 2023-08-19 – 2023-08-20 (×6): 2 [IU] via SUBCUTANEOUS
  Administered 2023-08-20: 3 [IU] via SUBCUTANEOUS
  Administered 2023-08-21 (×2): 2 [IU] via SUBCUTANEOUS
  Filled 2023-08-18 (×10): qty 1

## 2023-08-18 NOTE — TOC Initial Note (Signed)
 Transition of Care [facility]) - Initial/Assessment Note    Patient Details  Name: Jeff Key MRN: [id number] Date of Birth: 1945/12/15  Transition of Care [facility]) CM/SW Contact:    Alexandra Ice, RN Phone Number: 08/18/2023, 4:22 PM  Clinical Narrative:                  Met with patient and spouse, Jeff Key, at bedside. She states her kids assist with getting him into his recliner. Home DME: wheelchair, [place], RW. She states she returned the hospital bed, as he did not like it. He sleeps in therapeutic bed now. He is current with [facility] for nursing to flush cholecystostomy.  TOC sent message to [facility] with [name] to confirm patient is current with services. TOC will continue to follow.   Expected Discharge Plan: Home w Home Health Services Barriers to Discharge: Continued Medical Work up   Patient Goals and CMS Choice            Expected Discharge Plan and Services     Post Acute Care Choice: Home Health Living arrangements for the past 2 months: Single Family Home                           HH Arranged: RN HH Agency: [facility]        Prior Living Arrangements/Services Living arrangements for the past 2 months: Single Family Home Lives with:: Spouse Patient language and need for interpreter reviewed:: Yes Do you feel safe going back to the place where you live?: Yes      Need for Family Participation in Patient Care: Yes (Comment) Care giver support system in place?: Yes (comment) Current home services: DME, Home RN (wheelchair, shower chair, rolling walker. Home Health RN) Criminal Activity/Legal Involvement Pertinent to Current Situation/Hospitalization: No - Comment as needed  Activities of Daily Living   ADL Screening (condition at time of admission) Independently performs ADLs?: No Does the patient have a NEW difficulty with bathing/dressing/toileting/self-feeding that is expected to last >3 days?: No Does the patient have a NEW difficulty  with getting in/out of bed, walking, or climbing stairs that is expected to last >3 days?: No Does the patient have a NEW difficulty with communication that is expected to last >3 days?: No Is the patient deaf or have difficulty hearing?: No Does the patient have difficulty seeing, even when wearing glasses/contacts?: No Does the patient have difficulty concentrating, remembering, or making decisions?: Yes  Permission Sought/Granted                  Emotional Assessment Appearance:: Appears older than stated age Attitude/Demeanor/Rapport: Unable to Assess Affect (typically observed): Unable to Assess   Alcohol  / Substance Use: Not Applicable Psych Involvement: No (comment)  Admission diagnosis:  CAP (community acquired pneumonia) [J18.9] Altered mental status, unspecified altered mental status type [R41.82] Pneumonia due to infectious organism, unspecified laterality, unspecified part of lung [J18.9] Sepsis, due to unspecified organism, unspecified whether acute organ dysfunction present [facility]) [A41.9] Patient Active Problem List   Diagnosis Date Noted   Pelvic abscess in male [facility]) 07/12/2023   Acute cholecystitis 07/05/2023   Severe sepsis with lactic acidosis (HCC) 11/16/2021   Elevated liver function tests    Enteritis    Ileus (HCC)    Acute on chronic systolic CHF (congestive heart failure) (HCC)    Goals of care, counseling/discussion    Palliative care by specialist    CAP (community  acquired pneumonia) 10/14/2019   Acute metabolic encephalopathy 10/14/2019   hx of Ulcerative colitis (HCC) 10/14/2019   Acute respiratory failure with hypoxia (HCC) 10/14/2019   Severe sepsis (HCC) 10/14/2019   Hyperlipidemia 06/15/2017   Alzheimer's dementia without behavioral disturbance (HCC) 12/11/2016   Sepsis (HCC) 03/10/2016   Pseudoaneurysm of right femoral artery (HCC) 02/23/2016   NSVT (nonsustained ventricular tachycardia) (HCC) 02/15/2016   CAD (coronary artery disease)     Ischemic cardiomyopathy    STEMI involving oth coronary artery of inferior wall (HCC) 02/10/2016   Abnormal urinalysis 10/16/2014   PCP:  Antonio Baumgarten, MD Pharmacy:   [facility] [address] [place] - [address] [place] [address] Phone: [phone] Fax: [phone]  [facility] [address] [place] [address] Phone: [phone] Fax: [phone]     Social Drivers of Health (SDOH) Social History: SDOH Screenings   Food Insecurity: Patient Unable To Answer (08/17/2023)  Housing: Patient Unable To Answer (08/17/2023)  Transportation Needs: Patient Unable To Answer (08/17/2023)  Utilities: Patient Unable To Answer (08/17/2023)  Financial Resource Strain: Low Risk  (07/13/2022)   Received from [facility], [facility]  Social Connections: Unknown (08/17/2023)  Tobacco Use: Medium Risk (08/17/2023)   SDOH Interventions:     Readmission Risk Interventions     No data to display

## 2023-08-18 NOTE — Progress Notes (Addendum)
 Progress Note   Patient: Jeff Key WUJ:[id number] DOB: 12/17/1945 DOA: 08/17/2023     0 DOS: the patient was seen and examined on 08/18/2023   Brief hospital course:  "FERNAND SORBELLO is a 78 y.o. 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  Patient was found to have left lower lobe pneumonia and was admitted for further evaluation and management including IV antibiotics.  Further hospital course and management as outlined below.   Assessment and Plan:   Severe Sepsis  Acute metabolic encephalopathy Community-acquired PNA, left lower lobe Sepsis POA with evidenced by leukocytosis, tachycardia, tachypnea with signs of endorgan damage includingacute metabolic encephalopathy and lactic acidosis --Rocephin  and Zithromax  --Mucolytics, antipyretics PRN --SLP consult for swallow evaluation --NPO for now, IV fluids for maintenance hydration --Supplement O2 if sats < 90% on room air   Severe constipation, failed outpatient management --Given enema x 1 on admission --Continue bowel regimen   History of ischemic cardiomyopathy Chronic HFrEF --Monitor volume status with sepsis IV fluids --Holding home BP meds for now, risk of hypotension with sepsis --PRN hydralazine  --Resume meds when appropriate  Cholecystitis with cholecystostomy drain in place --Continue drain care and flushes BID --Monitor drain output   Insulin -dependent type 2 diabetes --Sliding scale Novolog  --Titrate regimen   Physical Deconditioning --PT evaluation --Fall precautions  Advanced dementia --Continue Namenda    Obesity - Body mass index is 31.84 kg/m. Complicates overall care and prognosis.   Recommend lifestyle modifications including physical activity and diet for weight loss and overall long-term health.       Subjective: Pt sleeping this AM, wife at bedside. Pt responds only briefly but returns to sleep without meaningful interaction.  Wife reports pt has mostly just been sleeping a lot since admission, no significant change yet since admission.  Discussed fever this AM, wife agreeable to stay for further IV antibiotics until more improvement and no more fevers.  No acute events reported.   Physical Exam: Vitals:   08/18/23 0531 08/18/23 0531 08/18/23 0728 08/18/23 0800  BP: 116/81 116/81 (!) 143/87   Pulse: 93 95 92   Resp: 19 19 17    Temp: 100.1 F (37.8 C) 100.1 F (37.8 C) (!) 100.6 F (38.1 C)   TempSrc:   Oral   SpO2: 91% 94% 96% 96%  Weight:      Height:       General exam: sleeping comfortably, responds briefly, no acute distress HEENT: eyes closed, moist mucus membranes, hearing grossly normal  Respiratory system: clear but diminished on left with faint intermittent rhonchi, no wheezes, normal respiratory effort. Cardiovascular system: normal S1/S2, RRR, no pedal edema.   Gastrointestinal system: soft, NT, ND, +bowel sounds, biliary drain in place. Central nervous system: exam limited by somnolence and dementia, no gross focal neurologic deficits Skin: dry, intact, normal temperature Psychiatry: exam limited by somnolence   Data Reviewed:  Notable labs --   Glucose 154, Ca 8.6 otherwise normal BMP  CBC with WBC 19.4 >> 18 improving, Hbg 13.3 >> 11.4 (likely dilution from sepsis IV fluids)  Lactic acid trend 2.5 >> 2.4 >> repeat pending   Family Communication: wife at bedside on AM rounds  Disposition: Status is: Inpatient Remains inpatient appropriate because: remains on IV antibiotics pending furhter clinical improvement given  fever this AM and persistent lethargy   Planned Discharge Destination: Home    Time spent: 45  minutes  Author: Montey Apa, DO 08/18/2023 11:19 AM  For on call review [email or web address].

## 2023-08-18 NOTE — Progress Notes (Signed)
 SLP Cancellation Note  Patient Details Name: Jeff Key MRN: [id number] DOB: [birth year]   Cancelled treatment:       Reason Eval/Treat Not Completed: Patient's level of consciousness. RN reporting minimal alertness this date. Pt resting when therapist was at the bedside. No family present. Current level of consciousness precludes safety with oral intake; bedside swallow assessment held. RN/MD made aware. SLP will continue to follow.   [place] Khalik Pewitt Clapp, MS, CCC-SLP Speech Language Pathologist Rehab Services; [facility] [phone] (ascom)     [place] J Clapp 08/18/2023, 11:23 AM

## 2023-08-18 NOTE — Care Management Obs Status (Signed)
 MEDICARE OBSERVATION STATUS NOTIFICATION   Patient Details  Name: Jeff Key MRN: [id number] Date of Birth: 04-Aug-1945   Medicare Observation Status Notification Given:  Yes    Anise Kerns 08/18/2023, 10:13 AM

## 2023-08-18 NOTE — Progress Notes (Signed)
 PT Cancellation Note  Patient Details Name: Jeff Key MRN: [id number] DOB: [birth year]   Cancelled Treatment:    Reason Eval/Treat Not Completed: Patient's level of consciousness (attempted PT evaluation. Patient would not open eyes and was minimally roused by attempts to wake him and unable to participate in physical therapy evaluation.) Family states he has been at this level of consciousness and minimally responsive since hospitalization. Patient does appear to have declined in function since April based on information provided by family below. Will re-attempt PT eval at later time/date.   Patient's wife [name] and daughter Milda Aline present, and they provided some history. He has been non-verbal for years due to advanced dementia. He had a cholecystectomy in April 2025, and since then has had worsened functional mobility, only getting out of bed a couple of times with +2 assist. Prior to that he would transfer bed to w/c to lift chair with +2 assist at lunch time and back at bedtime. He also took a few steps with +2 assist, walking all the way from the bedroom to the lift chair one time in the last several months. He refuses to use the RW or cannot use it effectively. He is dependent for all care that is provided by his wife and children and their families. He has 24/7 supervision. He uses an adjustable bed but his wife returned the hospital bed and hoyer lift they had before. She states he likes the adjustable bed better and the hoyer lift did not work well for them. He lives on the first floor of a 2 story home and gets sponge baths. It became too hard to get him into the shower. They have a RW, BSC, shower chair, and w/c at home. They have to call medical transfer to take him out of the house and he does not leave the house except for medical needs. Since his cholecystectomy he has had weekly RN visits at home. They family has no concerns about taking him back home, except [name] expects she will  need to hire someone to help her more.   Carilyn Charles. Artemio Larry, PT, DPT 08/18/23, 2:03 PM

## 2023-08-18 NOTE — Plan of Care (Signed)
 Yellow MEWs protocol initiated 2/2 LOC and mild tachycardia. Tachycardia improved but patient remains yellow mews due to LOC. Per family at baseline patient keeps his eyes closed most of the time and does not follow command. Pt did open his eyes when turned to his side for sacral patch placement.  Of note: patient did not urinate on his own. Bladder scanned showed . Provider notified and in and out cath order obtained. Pt had output from his I&O cath.    Problem: Education: Goal: Ability to describe self-care measures that may prevent or decrease complications (Diabetes Survival Skills Education) will improve Outcome: Progressing   Problem: Coping: Goal: Ability to adjust to condition or change in health will improve Outcome: Progressing   Problem: Skin Integrity: Goal: Risk for impaired skin integrity will decrease Outcome: Progressing   Problem: Safety: Goal: Ability to remain free from injury will improve Outcome: Progressing

## 2023-08-19 DIAGNOSIS — K810 Acute cholecystitis: Secondary | ICD-10-CM | POA: Diagnosis not present

## 2023-08-19 DIAGNOSIS — J189 Pneumonia, unspecified organism: Secondary | ICD-10-CM | POA: Diagnosis not present

## 2023-08-19 DIAGNOSIS — L8990 Pressure ulcer of unspecified site, unspecified stage: Secondary | ICD-10-CM | POA: Insufficient documentation

## 2023-08-19 DIAGNOSIS — G9341 Metabolic encephalopathy: Secondary | ICD-10-CM | POA: Diagnosis not present

## 2023-08-19 LAB — CBC
HCT: 33.2 % — ABNORMAL LOW (ref 39.0–52.0)
Hemoglobin: 10.7 g/dL — ABNORMAL LOW (ref 13.0–17.0)
MCH: 28.8 pg (ref 26.0–34.0)
MCHC: 32.2 g/dL (ref 30.0–36.0)
MCV: 89.5 fL (ref 80.0–100.0)
Platelets: 258 10*3/uL (ref 150–400)
RBC: 3.71 MIL/uL — ABNORMAL LOW (ref 4.22–5.81)
RDW: 13.3 % (ref 11.5–15.5)
WBC: 14.2 10*3/uL — ABNORMAL HIGH (ref 4.0–10.5)
nRBC: 0 % (ref 0.0–0.2)

## 2023-08-19 LAB — GLUCOSE, CAPILLARY
Glucose-Capillary: 108 mg/dL — ABNORMAL HIGH (ref 70–99)
Glucose-Capillary: 113 mg/dL — ABNORMAL HIGH (ref 70–99)
Glucose-Capillary: 117 mg/dL — ABNORMAL HIGH (ref 70–99)
Glucose-Capillary: 124 mg/dL — ABNORMAL HIGH (ref 70–99)
Glucose-Capillary: 127 mg/dL — ABNORMAL HIGH (ref 70–99)
Glucose-Capillary: 138 mg/dL — ABNORMAL HIGH (ref 70–99)

## 2023-08-19 LAB — BASIC METABOLIC PANEL WITH GFR
Anion gap: 10 (ref 5–15)
BUN: 16 mg/dL (ref 8–23)
CO2: 24 mmol/L (ref 22–32)
Calcium: 8.4 mg/dL — ABNORMAL LOW (ref 8.9–10.3)
Chloride: 106 mmol/L (ref 98–111)
Creatinine, Ser: 0.73 mg/dL (ref 0.61–1.24)
GFR, Estimated: >60 mL/min (ref >60)
Glucose, Bld: 117 mg/dL — ABNORMAL HIGH (ref 70–99)
Potassium: 3.5 mmol/L (ref 3.5–5.1)
Sodium: 140 mmol/L (ref 135–145)

## 2023-08-19 LAB — LACTIC ACID, PLASMA: Lactic Acid, Venous: 1 mmol/L (ref 0.5–1.9)

## 2023-08-19 MED ORDER — ORAL CARE MOUTH RINSE: 15.0000 mL | OROMUCOSAL | Status: DC | PRN

## 2023-08-19 MED ORDER — BISACODYL 10 MG RE SUPP
10.0000 mg | Freq: Every day | RECTAL | Status: DC | PRN
  Administered 2023-08-19 – 2023-08-24 (×2): 10 mg via RECTAL
  Filled 2023-08-19 (×2): qty 1

## 2023-08-19 MED ORDER — DEXTROSE IN LACTATED RINGERS 5 % IV SOLN: INTRAVENOUS | Status: AC

## 2023-08-19 NOTE — Progress Notes (Signed)
 PT Cancellation Note  Patient Details Name: Jeff Key MRN: [id number] DOB: 1945/04/30   Cancelled Treatment:    Reason Eval/Treat Not Completed: Fatigue/lethargy limiting ability to participate: Per nursing pt inappropriate to participate with PT services this date secondary to lethargy.  Will attempt to see pt at a future date/time as medically appropriate.   Lavenia Post PT, DPT 08/19/23, 8:47 AM

## 2023-08-19 NOTE — Progress Notes (Addendum)
 Progress Note   Patient: Jeff Key ZOX:[id number] DOB: 1945/04/03 DOA: 08/17/2023     1 DOS: the patient was seen and examined on 08/19/2023   Brief hospital course:  "TYRIK STETZER is a 78 y.o. 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  Patient was found to have left lower lobe pneumonia and was admitted for further evaluation and management including IV antibiotics.  Further hospital course and management as outlined below.   Assessment and Plan:   Severe Sepsis  Acute metabolic encephalopathy - persistent, pt has been mostly unresponsive since admission. Community-acquired PNA, left lower lobe Sepsis POA with evidenced by leukocytosis, tachycardia, tachypnea with signs of endorgan damage includingacute metabolic encephalopathy and lactic acidosis --Rocephin  and Zithromax  --Mucolytics, antipyretics PRN --SLP consult for swallow evaluation --NPO remains due to persistent encephalopathy --Change IV fluids to D5-LR at 50 cc/hr for maintenance hydration --Supplement O2 if sats < 90% on room air  Inadequate PO intake - due to encephalopathy, pt remains NPO since admission --On maintenance IV fluids as above --SLP following to resume diet when safe, pt not awake or alert to attempt yet --Palliative care consult to address GOC given underlying advanced dementia, prognosis overall is poor   Severe constipation, failed outpatient management --Given enema with small result --Daily suppository if no BM in > 24 hrs --Further enemas PRN --Scheduled Senna-S BID when alert enough for PO meds   History of ischemic cardiomyopathy Chronic HFrEF Last Echo 4/17/256 - EF 55-60%, grade I  DD, mild MR --Monitor volume status  --Holding PO BP meds for now, NPO due to persistent AMS --PRN hydralazine  --Resume meds when appropriate  Cholecystitis with cholecystostomy drain in place --Continue drain care and flushes BID --Monitor drain output   Insulin -dependent type 2 diabetes --Sliding scale Novolog  --Titrate regimen   Physical Deconditioning --PT evaluation --Fall precautions  Advanced dementia --Continue Namenda    Obesity - Body mass index is 31.84 kg/m. Complicates overall care and prognosis.  Recommend lifestyle modifications including physical activity and diet for weight loss and overall long-term health.       Subjective: Pt sleeping this AM, wife at bedside. Wife reports pt continues to sleep, has not been waking up much since admission.  He is a little more alert at home but often sleeps much of the day. Wife states even when he is awake at home, he tends to keep eyes closed for the most part.  We discussed code status and wife agrees to full code as is currently ordered.   Physical Exam: Vitals:   08/18/23 1950 08/19/23 0020 08/19/23 0430 08/19/23 0900  BP: 136/65 131/70 121/62 116/61  Pulse: 78 75 76 73  Resp: 20 20 20 17   Temp: 100.1 F (37.8 C) 99.2 F (37.3 C) 98.7 F (37.1 C) 98 F (36.7 C)  TempSrc:  Oral  Oral  SpO2: 96% 96% 97% 91%  Weight:      Height:       General exam: sleeping comfortably, minimally responsive to verbal and tactile stimulus - unchanged mentation, no acute distress HEENT: eyes closed, moist mucus membranes, hearing grossly normal  Respiratory system: scattered rhonchi vs referred upper airway sounds, no wheezes, normal respiratory effort. Cardiovascular system: normal S1/S2, RRR, no pedal edema.   Gastrointestinal  system: soft, NT, ND, +bowel sounds, biliary drain in place. Central nervous system: exam limited by somnolence and dementia, no gross focal neurologic deficits Skin: dry, intact, normal  temperature Psychiatry: exam limited by somnolence   Data Reviewed:  Notable labs --   Glucose 154, Ca 8.6 otherwise normal BMP  CBC with WBC 19.4 >> 18 improving, Hbg 13.3 >> 11.4 (likely dilution from sepsis IV fluids)  Lactic acid trend 2.5 >> 2.4 >> repeat pending   Family Communication: wife at bedside on AM rounds  Disposition: Status is: Inpatient Remains inpatient appropriate because: remains on IV antibiotics pending furhter clinical improvement given fever this AM and persistent lethargy   Planned Discharge Destination: Home    Time spent: 45 minutes  Author: Montey Apa, DO 08/19/2023 11:29 AM  For on call review [email or web address].

## 2023-08-19 NOTE — Plan of Care (Signed)
 Palliative consult received.  Noted and discussed with primary team, IPAL conversations held with wife earlier this AM. Goals of care and desire to remain Full Code clear as documented from conversation. Plan to meet with wife/family this afternoon or tomorrow morning to follow up on goals of care conversations. Visited at bedside, Jeff Key unable to participate in goals of care conversations, per nursing staff, wife left to rest and shower with plans to return this evening. PMT to attempt follow-up with wife/family 6/2.  No Charge.  Isadore Marble, DNP, AGNP-C Palliative Medicine  Please call Palliative Medicine team phone with any questions [phone]. For individual providers please see AMION.

## 2023-08-19 NOTE — Progress Notes (Signed)
 Civil engineer, contracting Palliative Care Liaison Note  Mr. Akshat Minehart. Kovalcik is a current palliative patient open to [facility] outpatient consultative RN/SW model.  Hospital Liaison Team will follow through final disposition.  Ambrosio Junker, RN Nurse Liaison [phone]

## 2023-08-19 NOTE — Progress Notes (Signed)
 SLP Cancellation Note  Patient Details Name: Jeff Key MRN: [id number] DOB: Oct 15, 1945   Cancelled treatment:       Reason Eval/Treat Not Completed: Patient's level of consciousness. Per nsg patient remains unresponsive. Will continue efforts as medically appropriate.  Scheryl Cushing, [place], Sports administrator Office: [phone] ASCOM: [phone]    Luster Salters 08/19/2023, 10:42 AM

## 2023-08-19 NOTE — IPAL (Signed)
  Interdisciplinary Goals of Care Family Meeting   Date carried out: 08/19/2023  Location of the meeting: Bedside  Member's involved: Physician and Family Member or next of kin  Durable Power of Attorney or Environmental health practitioner: Wife    Discussion: We discussed goals of care for [employer] .  We specifically discussed code status after I had reviewed outpatient clinical notes that mentioned patient being followed by Palliative Care, and having a gold DNR sheet.  Noted code status in CHL is full code.  Discussed with wife at bedside today.  She states patient is not followed by palliative care.  She does state that he has a gold paper form DNR.  However, she states that she would want resuscitation efforts for the patient in the event of cardiac or respiratory arrest, but would not want patient to be kept alive if his condition were not reversible.  I clarified that, if patient's heart were to stop or we could not support his breathing by nasal cannula or face mask, she would want the team to perform CPR, defibrillation, intubation and mechanical ventilation.  She states yes in agreement to these measures.   Code status:   Code Status: Full Code   Disposition: Continue current acute care  Time spent for the meeting: 20 minutes    Montey Apa, DO  08/19/2023, 11:25 AM

## 2023-08-20 ENCOUNTER — Telehealth: Payer: Self-pay

## 2023-08-20 DIAGNOSIS — Z515 Encounter for palliative care: Secondary | ICD-10-CM

## 2023-08-20 DIAGNOSIS — G309 Alzheimer's disease, unspecified: Secondary | ICD-10-CM | POA: Diagnosis not present

## 2023-08-20 DIAGNOSIS — A419 Sepsis, unspecified organism: Secondary | ICD-10-CM | POA: Diagnosis not present

## 2023-08-20 DIAGNOSIS — R4182 Altered mental status, unspecified: Secondary | ICD-10-CM

## 2023-08-20 DIAGNOSIS — G9341 Metabolic encephalopathy: Secondary | ICD-10-CM | POA: Diagnosis not present

## 2023-08-20 DIAGNOSIS — Z66 Do not resuscitate: Secondary | ICD-10-CM

## 2023-08-20 DIAGNOSIS — J189 Pneumonia, unspecified organism: Secondary | ICD-10-CM | POA: Diagnosis not present

## 2023-08-20 DIAGNOSIS — Z789 Other specified health status: Secondary | ICD-10-CM

## 2023-08-20 DIAGNOSIS — F0280 Dementia in other diseases classified elsewhere without behavioral disturbance: Secondary | ICD-10-CM

## 2023-08-20 DIAGNOSIS — Z7189 Other specified counseling: Secondary | ICD-10-CM

## 2023-08-20 LAB — BASIC METABOLIC PANEL WITH GFR
Anion gap: 9 (ref 5–15)
BUN: 15 mg/dL (ref 8–23)
CO2: 25 mmol/L (ref 22–32)
Calcium: 8.5 mg/dL — ABNORMAL LOW (ref 8.9–10.3)
Chloride: 105 mmol/L (ref 98–111)
Creatinine, Ser: 0.69 mg/dL (ref 0.61–1.24)
GFR, Estimated: >60 mL/min (ref >60)
Glucose, Bld: 127 mg/dL — ABNORMAL HIGH (ref 70–99)
Potassium: 3.3 mmol/L — ABNORMAL LOW (ref 3.5–5.1)
Sodium: 139 mmol/L (ref 135–145)

## 2023-08-20 LAB — CBC
HCT: 32.8 % — ABNORMAL LOW (ref 39.0–52.0)
Hemoglobin: 10.3 g/dL — ABNORMAL LOW (ref 13.0–17.0)
MCH: 28.2 pg (ref 26.0–34.0)
MCHC: 31.4 g/dL (ref 30.0–36.0)
MCV: 89.9 fL (ref 80.0–100.0)
Platelets: 302 10*3/uL (ref 150–400)
RBC: 3.65 MIL/uL — ABNORMAL LOW (ref 4.22–5.81)
RDW: 13.1 % (ref 11.5–15.5)
WBC: 12.9 10*3/uL — ABNORMAL HIGH (ref 4.0–10.5)
nRBC: 0 % (ref 0.0–0.2)

## 2023-08-20 LAB — MAGNESIUM: Magnesium: 2 mg/dL (ref 1.7–2.4)

## 2023-08-20 LAB — PHOSPHORUS: Phosphorus: 3.6 mg/dL (ref 2.5–4.6)

## 2023-08-20 LAB — GLUCOSE, CAPILLARY
Glucose-Capillary: 108 mg/dL — ABNORMAL HIGH (ref 70–99)
Glucose-Capillary: 116 mg/dL — ABNORMAL HIGH (ref 70–99)
Glucose-Capillary: 125 mg/dL — ABNORMAL HIGH (ref 70–99)
Glucose-Capillary: 128 mg/dL — ABNORMAL HIGH (ref 70–99)
Glucose-Capillary: 134 mg/dL — ABNORMAL HIGH (ref 70–99)
Glucose-Capillary: 160 mg/dL — ABNORMAL HIGH (ref 70–99)

## 2023-08-20 MED ORDER — ORAL CARE MOUTH RINSE
15.0000 mL | OROMUCOSAL | Status: DC
  Administered 2023-08-20 – 2023-08-27 (×42): 15 mL via OROMUCOSAL

## 2023-08-20 MED ORDER — POTASSIUM CHLORIDE 10 MEQ/100ML IV SOLN
10.0000 meq | INTRAVENOUS | Status: AC
  Administered 2023-08-20 (×3): 10 meq via INTRAVENOUS
  Filled 2023-08-20 (×3): qty 100

## 2023-08-20 NOTE — Progress Notes (Signed)
 PT Cancellation Note  Patient Details Name: Jeff Key MRN: [id number] DOB: 1945-08-10   Cancelled Treatment:    Reason Eval/Treat Not Completed: Other (comment). Orders received and chart reviewed. Per EMR and discussion with MD via secure chat pt with no acute PT needs due to need for 2 person assist and being bed level at baseline. Attending MD in agreement for PT to discharge in house.    Marc Senior. Fairly IV, PT, DPT Physical Therapist- [place]  [facility]  08/20/2023, 1:28 PM

## 2023-08-20 NOTE — Progress Notes (Signed)
 SLP Cancellation Note  Patient Details Name: Jeff Key MRN: [id number] DOB: [birth year]   Cancelled treatment:       Reason Eval/Treat Not Completed: Patient's level of consciousness. RN reporting that pt remains with reduced alertness; therefore is not ready for bedside swallow assessment. This attempt x4 for assessment (5/30-6/2). Based on consistent, minimal alertness- SLP will cancel oder. Please re-consult when pending increased alertness. MD and RN aware of recommendations.   Jeff Jolan Mealor Clapp, MS, CCC-SLP Speech Language Pathologist Rehab Services; [facility] [phone] (ascom)   Jeff Key 08/20/2023, 1:16 PM

## 2023-08-20 NOTE — Progress Notes (Signed)
   08/20/23 0945  Spiritual Encounters  Type of Visit Initial  Care provided to: Pt and family  Conversation partners present during encounter Nurse  Reason for visit Routine spiritual support  OnCall Visit No   Chaplain visited patient to see how he and his wife were doing.  Patient wasn't communicative with Chaplain but wife was at bedside.  She shared that the two will be married 59 years soon and they share 3 daughters, grands and great grands.  Wife was proud of their family and the fact that she'd always been a caregiver for her own mother and the support she offers her grands.  Chaplain offered a compassionate presence and reflective listening.  Chaplain prayed with wife at bedside at wife's request.     Rev. Rana M. Nolon Baxter, M.Div. Chaplain Resident [facility]

## 2023-08-20 NOTE — Progress Notes (Signed)
 Civil engineer, contracting [facility]) Liaison Note  Received new referral from Sherlyn Ditto, [facility] that patient's wife would like information on hospice.  Patient is currently served under [facility] outpatient palliative program.  I met with Mrs. Dehaven at the bedside to discuss Hospice Medicare Benefit, hospice philosophy, team approach to care and comfort approach.  Mrs. Macke is appreciative of the information provided, but would like more time to think about it.  Right now, she wants to continue on current hospital treatment and see how he does.  She has numbers to call if she has more questions or would like to talk again.  Notified hospital medical team, including Ina Manas, NP with PMT.  Hospital Liaison will continue to follow.  Please note- this was an information session.  Referral for hospice was not sent per wife's request.  Ambrosio Junker, MA, BSN, RN, FNE Clinical Nurse Liaison [phone]

## 2023-08-20 NOTE — Consult Note (Signed)
 Consultation Note Date: 08/20/2023 at 1030  Patient Name: Jeff Key  DOB: February 15, 1946  MRN: [id number]  Age / Sex: 78 y.o., male  PCP: Antonio Baumgarten, MD Referring Physician: Montey Apa, DO  HPI/Patient Profile: 78 y.o. male  with past medical history significant for advanced dementia, non-verbal at baseline, CAD, ischemic cardiomyopathy, HFrEF (45/50%),chronic ulcerated proctosigmoiditis, recent cholecystitis s/p cholecystostomy drain, IIDM and obesity. Pt presented to ED  08/17/2023 with complaint of altered mental status and weakness.   ED workup noted WBC 19, Hgb 13, TSH 2.5/2.4, BUN 15, creatinine 0.8. CXR suspicious for LLL PNA. CT AP showed LLL infiltrates versus atelectasis. CT head negative. BP 121/82, HR 106, RR 24, 97% RA, 99.1  Patient was admitted to [facility] for management of CAP and acute metabolic encephalopathy.   Palliative was consulted for goals of care conversation.   Clinical Assessment and Goals of Care: Extensive chart review completed prior to meeting patient including labs, vital signs, imaging, progress notes, orders, and available advanced directive documents from current and previous encounters. I then met with wife to discuss diagnosis prognosis, GOC, EOL wishes, disposition and options.  I introduced Palliative Medicine as specialized medical care for people living with serious illness. It focuses on providing relief from the symptoms and stress of a serious illness. The goal is to improve quality of life for both the patient and the family.  Ill-appearing, elderly male resting in bed. He does not respond to verbal or tactile stimuli. Even, unlabored respirations. He is in no distress.   We discussed a brief life review of the patient. [name] and his wife, Jeff Key, have been married for 59 years. They have 3 children, 6 grandchildren and 5 great-grandchildren. Mr.  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   As far as functional and nutritional status, Jeff Key reports that she is his caregiver. Her son or grandson assist with moving him out the bed in the mornings to bed side commode and then recliner. Jeff Key bathes, dresses and feeds Mr. [name]. In the evening, family members assist with placing him in the bed. Prior to the biliary drain placement, he had good appetite. After drain placement, he has been sleeping more and not eating well. She also shares that he has not spoken for around 2 years.   We discussed patient's current illness and what it means in the larger context of patient's on-going co-morbidities. Natural disease trajectory and expectations at EOL were discussed. Jeff Key understands that Jeff Key's dementia is progressive and will not get better.   I attempted to elicit values and goals of care important to the patient. Jeff Key wants her husband to return home.   The difference between aggressive medical intervention and comfort care was considered in light of the patient's goals of care. At this time, she wants to continue treating his PNA with antibiotics. She would only be open to comfort care if he were to stop eating or not  be able to eat any longer.   Advance directives, concepts specific to code status, artificial feeding and hydration, and rehospitalization were considered and discussed. Jeff Key reports that her husband was previously a DNR but she has lost the form. She does not want CPR or breathing machine. Advised that a new DNR form would be completed and his code status would be updated to DNR. In regards to feeding tube, Jeff Key does not want this for her husband.   Education offered regarding concept specific to human mortality and the limitations of medical interventions to prolong life when the body begins to fail to thrive.  Family is facing  treatment option decisions, advanced directive, and anticipatory care needs.    Discussed with wife the importance of continued conversation with family and the medical providers regarding overall plan of care and treatment options, ensuring decisions are within the context of the patient's values and GOCs.    Hospice and Palliative Care services outpatient were explained and offered. Jeff Key thinks that he husband may have palliative care in the home but she is not sure. She is interested in having [facility] liaison speak with her about transitioning her husband to in home hospice care.   Questions and concerns were addressed. The family was encouraged to call with questions or concerns.   Primary Decision Maker NEXT OF KIN- Jeff Key - wife  Physical Exam Vitals reviewed.  Constitutional:      General: He is not in acute distress.    Appearance: He is ill-appearing.  HENT:     Head: Normocephalic and atraumatic.     Nose:     Comments: O2 via [place]    Mouth/Throat:     Mouth: Mucous membranes are dry.  Pulmonary:     Effort: Pulmonary effort is normal. No respiratory distress.  Musculoskeletal:     Right lower leg: No edema.     Left lower leg: No edema.  Skin:    General: Skin is warm and dry.     Recommendations/Plan: DNR/DNI- Uploaded to Epic via [facility], signed DNR placed in paper chart Continue current supportive interventions Consult placed to [facility] liaison for in home hospice evaluation  Palliative Assessment/Data: 20-30%   Discussed plan of care with Dr. Antoniette Batty, primary RN, TOC and [facility] liaison.   Thank you for this consult. Palliative medicine will continue to follow and assist holistically.   Time Total: 75 minutes  Time spent includes: Detailed review of medical records (labs, imaging, vital signs), medically appropriate exam (mental status, respiratory, cardiac, skin), discussed with treatment team, counseling and educating patient, family and staff, documenting  clinical information, medication management and coordination of care.     Ina Manas, Joyice Nodal [facility] Palliative Medicine Team  08/20/2023 8:47 AM  Office [phone]  Pager [phone]     Please contact Palliative Medicine Team providers via AMION for questions and concerns.

## 2023-08-20 NOTE — Progress Notes (Signed)
 Progress Note   Patient: Jeff Key DOB: Apr 05, 1945 DOA: 08/17/2023     2 DOS: the patient was seen and examined on 08/20/2023   Brief hospital course:  "Jeff Key is a 78 y.o. 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  Patient was found to have left lower lobe pneumonia and was admitted for further evaluation and management including IV antibiotics.  Further hospital course and management as outlined below.   Assessment and Plan:   Severe Sepsis  Acute metabolic encephalopathy - persistent, pt has been mostly unresponsive since admission. Community-acquired PNA, left lower lobe Sepsis POA with evidenced by leukocytosis, tachycardia, tachypnea with signs of endorgan damage includingacute metabolic encephalopathy and lactic acidosis --Rocephin  and Zithromax  --Mucolytics, antipyretics PRN --SLP consult for swallow evaluation --NPO remains due to persistent encephalopathy --Change IV fluids to D5-LR at 50 cc/hr for maintenance hydration --Supplement O2 if sats < 90% on room air  Inadequate PO intake - due to encephalopathy, pt remains NPO since admission --On maintenance IV fluids as above --SLP following to resume diet when safe, pt not awake or alert to attempt yet --Palliative care consult to address GOC given underlying advanced dementia, prognosis overall is poor  Hypokalemia - K 3.3. Mg normal 2.0 --IV K rider x 3 to replace --Monitor BMP   Severe constipation, failed outpatient management 6/1 - wife reports pt had BM this AM after suppository last night --Daily suppository if no BM in > 24 hrs --Further enemas PRN --Scheduled Senna-S BID when  alert enough for PO meds   History of ischemic cardiomyopathy Chronic HFrEF Last Echo 4/17/256 - EF 55-60%, grade I DD, mild MR --Monitor volume status  --Holding PO BP meds for now, NPO due to persistent AMS --PRN hydralazine  --Resume meds when appropriate  Cholecystitis with cholecystostomy drain in place --Continue drain care and flushes BID --Monitor drain output   Insulin -dependent type 2 diabetes --Sliding scale Novolog  --Titrate regimen   Physical Deconditioning --PT evaluation --Fall precautions  Advanced dementia --Continue Namenda    Obesity - Body mass index is 31.84 kg/m. Complicates overall care and prognosis.  Recommend lifestyle modifications including physical activity and diet for weight loss and overall long-term health.       Subjective: Pt sleeping this AM, wife at bedside.  Pt started waking up and opening his eyes, acting like he was trying to sit up during encounter this AM. Wife reports he has begun to wake up and be a bit more interactive this morning.  No acute complaints or events reported.  Wife hopeful he can wake up to attempt swallow evaluation today.   Physical Exam: Vitals:   08/19/23 1715 08/19/23 2026 08/20/23 0426 08/20/23 0729  BP: (!) 141/75 (!) 143/73 122/71 125/83  Pulse: 70 70 70 73  Resp: 17 17 20    Temp: 98.3 F (36.8 C) 99 F (37.2 C) 98.3 F (36.8 C) 99.4 F (37.4 C)  TempSrc: Oral  Oral Tympanic  SpO2: 95% 98% 95% 93%  Weight:      Height:       General exam: sleeping comfortably, wakes up and is more responsive today to verbal stimulus, no acute distress HEENT: opens eyes closed, moist mucus membranes, hearing grossly normal  Respiratory system: scattered rhonchi vs  referred upper airway sounds, no wheezes, normal respiratory effort. Cardiovascular system: normal S1/S2, RRR, no pedal edema.   Gastrointestinal system: soft, NT, ND, +bowel sounds, biliary drain in place. Central nervous system: exam limited by  advanced dementia, non-verbal, no gross focal neurologic deficits, squeezes my  hands and grip seems symmetric Skin: dry, intact, normal temperature    Data Reviewed:  Notable labs --   K 3.3, Glucose 127, Ca 8.5  CBC with WBC 19.4 >> 18 >> 14.2 >> 12.9 improving Hbg 13.3 >> 11.4 (likely dilution from sepsis IV fluids) >> 10.7 >> 10.3    Family Communication: wife at bedside on AM rounds daily  Disposition: Status is: Inpatient Remains inpatient appropriate because: remains on IV antibiotics pending furhter clinical improvement, still NPO due persistent lethargy pending SLP swallow evaluation   Planned Discharge Destination: Home    Time spent: 38 minutes  Author: Montey Apa, DO 08/20/2023 11:11 AM  For on call review [email or web address].

## 2023-08-20 NOTE — Telephone Encounter (Signed)
 Received voicemail from [facility] requesting wound care orders. Called them back and relayed that these orders would need to be provided by wound care or PCP as ID just manages antibiotics.   [phone]  Abbie Jablon, BSN, RN

## 2023-08-21 DIAGNOSIS — I2510 Atherosclerotic heart disease of native coronary artery without angina pectoris: Secondary | ICD-10-CM | POA: Diagnosis not present

## 2023-08-21 DIAGNOSIS — F0280 Dementia in other diseases classified elsewhere without behavioral disturbance: Secondary | ICD-10-CM | POA: Diagnosis not present

## 2023-08-21 DIAGNOSIS — G309 Alzheimer's disease, unspecified: Secondary | ICD-10-CM | POA: Diagnosis not present

## 2023-08-21 DIAGNOSIS — K810 Acute cholecystitis: Secondary | ICD-10-CM | POA: Diagnosis not present

## 2023-08-21 DIAGNOSIS — Z515 Encounter for palliative care: Secondary | ICD-10-CM | POA: Diagnosis not present

## 2023-08-21 DIAGNOSIS — J189 Pneumonia, unspecified organism: Secondary | ICD-10-CM | POA: Diagnosis not present

## 2023-08-21 LAB — BASIC METABOLIC PANEL WITH GFR
Anion gap: 11 (ref 5–15)
BUN: 12 mg/dL (ref 8–23)
CO2: 25 mmol/L (ref 22–32)
Calcium: 8.5 mg/dL — ABNORMAL LOW (ref 8.9–10.3)
Chloride: 105 mmol/L (ref 98–111)
Creatinine, Ser: 0.72 mg/dL (ref 0.61–1.24)
GFR, Estimated: >60 mL/min (ref >60)
Glucose, Bld: 117 mg/dL — ABNORMAL HIGH (ref 70–99)
Potassium: 3.4 mmol/L — ABNORMAL LOW (ref 3.5–5.1)
Sodium: 141 mmol/L (ref 135–145)

## 2023-08-21 LAB — CBC
HCT: 35 % — ABNORMAL LOW (ref 39.0–52.0)
Hemoglobin: 10.9 g/dL — ABNORMAL LOW (ref 13.0–17.0)
MCH: 27.7 pg (ref 26.0–34.0)
MCHC: 31.1 g/dL (ref 30.0–36.0)
MCV: 89.1 fL (ref 80.0–100.0)
Platelets: 305 10*3/uL (ref 150–400)
RBC: 3.93 MIL/uL — ABNORMAL LOW (ref 4.22–5.81)
RDW: 12.9 % (ref 11.5–15.5)
WBC: 11.8 10*3/uL — ABNORMAL HIGH (ref 4.0–10.5)
nRBC: 0 % (ref 0.0–0.2)

## 2023-08-21 LAB — BLOOD GAS, VENOUS
Acid-Base Excess: 2.4 mmol/L — ABNORMAL HIGH (ref 0.0–2.0)
Bicarbonate: 27.2 mmol/L (ref 20.0–28.0)
O2 Saturation: 78.1 %
Patient temperature: 37
pCO2, Ven: 42 mmHg — ABNORMAL LOW (ref 44–60)
pH, Ven: 7.42 (ref 7.25–7.43)
pO2, Ven: 48 mmHg — ABNORMAL HIGH (ref 32–45)

## 2023-08-21 LAB — AMMONIA: Ammonia: 29 umol/L (ref 9–35)

## 2023-08-21 LAB — GLUCOSE, CAPILLARY
Glucose-Capillary: 107 mg/dL — ABNORMAL HIGH (ref 70–99)
Glucose-Capillary: 108 mg/dL — ABNORMAL HIGH (ref 70–99)
Glucose-Capillary: 110 mg/dL — ABNORMAL HIGH (ref 70–99)
Glucose-Capillary: 113 mg/dL — ABNORMAL HIGH (ref 70–99)
Glucose-Capillary: 121 mg/dL — ABNORMAL HIGH (ref 70–99)
Glucose-Capillary: 131 mg/dL — ABNORMAL HIGH (ref 70–99)

## 2023-08-21 LAB — TSH: TSH: 2.701 u[IU]/mL (ref 0.350–4.500)

## 2023-08-21 MED ORDER — POTASSIUM CHLORIDE 10 MEQ/100ML IV SOLN
10.0000 meq | INTRAVENOUS | Status: AC
  Administered 2023-08-21 (×3): 10 meq via INTRAVENOUS
  Filled 2023-08-21 (×3): qty 100

## 2023-08-21 NOTE — Progress Notes (Signed)
 Palliative Care Progress Note, Assessment & Plan   Patient Name: Jeff Key       Date: 08/21/2023 DOB: 1946/03/17  Age: 78 y.o. MRN#: [id number] Attending Physician: Montey Apa, DO Primary Care Physician: Antonio Baumgarten, MD Admit Date: 08/17/2023  Subjective: Patient is lying in bed, sleeping.  He does not awaken to my verbal stimuli or light touch.  No signs of distress noted.  His wife and daughter at bedside during my visit.  HPI: 78 y.o. male  with past medical history significant for advanced dementia, non-verbal at baseline, CAD, ischemic cardiomyopathy, HFrEF (45/50%),chronic ulcerated proctosigmoiditis, recent cholecystitis s/p cholecystostomy drain, IIDM and obesity. Pt presented to ED  08/17/2023 with complaint of altered mental status and weakness.    ED workup noted WBC 19, Hgb 13, TSH 2.5/2.4, BUN 15, creatinine 0.8. CXR suspicious for LLL PNA. CT AP showed LLL infiltrates versus atelectasis. CT head negative. BP 121/82, HR 106, RR 24, 97% RA, 99.1   Patient was admitted to [facility] for management of CAP and acute metabolic encephalopathy.    Palliative was consulted for goals of care conversation.   Summary of counseling/coordination of care: Extensive chart review completed prior to meeting patient including labs, vital signs, imaging, progress notes, orders, and available advanced directive documents from current and previous encounters.   After reviewing the patient's chart and assessing the patient at bedside, I spoke with patient in regards to symptom management and goals of care.  Patient did not awaken to my presence.  He remained sleeping throughout the entirety of my visit.  Discussion held with patient's wife and daughter at bedside.  Discussed plan and goals of care with  patient's wife and daughter at bedside.  Education provided on dementia as a chronic, progressive, and irreversible disease that is often exacerbated by acute illness and hospitalizations.  Reviewed that patient is sleeping more, not awake and alert enough to be able to trial an SLP evaluation for swallow, and not as interactive as the family has known him to be.  Wife endorses patient often is awake but keeps his eyes closed.  However, she and daughter have noted that patient has been sleeping much more during this hospitalization.  Discussed that patient is recovering from pneumonia but that his baseline of dementia is contributing to his overall decreasing cognitive function/fullness.  Patient's wife shares concerns that if patient does not get evaluated by SLP prior to discharge and she is not sure what the plan is.  Discussed that if patient is not awake and alert to even participate in SLP evaluation then he is certainly not safe to take in p.o.'s.  Reviewed that if patient does not take in p.o.'s then he is facing end-of-life within weeks as patient's cannot go without hydration.  She shares understanding and appreciation.  She remains hopeful that he will "perk up".  Discussed that hospice remains available to patient even after discharge.  Hospice philosophy, aging in place, and hospice services briefly discussed.  Patient's wife shares she would like more time to consider this and continue with current plan of care.  Discussed remaining hopeful and realistic.  Quality versus quantity of life reviewed.  Difference between starving  and  No adjustment to [facility] at this time.  Extensive education provided on disease trajectory of dementia compounded by pneumonia.  PMT will continue to follow and support patient and family throughout his hospitalization.  Physical Exam Vitals reviewed.  Constitutional:      General: He is not in acute distress.    Appearance: He is not ill-appearing.  HENT:      Head: Normocephalic.  Cardiovascular:     Pulses: Normal pulses.  Pulmonary:     Effort: Pulmonary effort is normal.  Abdominal:     Palpations: Abdomen is soft.  Skin:    General: Skin is warm and dry.  Psychiatric:        Behavior: Behavior normal.             Total Time 50 minutes   Time spent includes: Detailed review of medical records (labs, imaging, vital signs), medically appropriate exam (mental status, respiratory, cardiac, skin), discussed with treatment team, counseling and educating patient, family and staff, documenting clinical information, medication management and coordination of care.  Judeen Nose L. Rebbeca Campi, DNP, FNP-BC Palliative Medicine Team

## 2023-08-21 NOTE — Care Management Important Message (Signed)
 Important Message  Patient Details  Name: Jeff Key MRN: [id number] Date of Birth: 1945-10-25   Important Message Given:  Yes - Medicare IM     Cassiopeia Florentino W, CMA 08/21/2023, 10:39 AM

## 2023-08-21 NOTE — Progress Notes (Addendum)
 Progress Note   Patient: Jeff Key EAV:[id number] DOB: 04/01/1945 DOA: 08/17/2023     3 DOS: the patient was seen and examined on 08/21/2023   Brief hospital course:  "JI FELDNER is a 78 y.o. 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  Patient was found to have left lower lobe pneumonia and was admitted for further evaluation and management including IV antibiotics.  Further hospital course and management as outlined below.   Assessment and Plan:   Severe Sepsis  Acute metabolic encephalopathy - persistent, pt has been mostly unresponsive since admission. Community-acquired PNA, left lower lobe Sepsis POA with evidenced by leukocytosis, tachycardia, tachypnea with signs of endorgan damage includingacute metabolic encephalopathy and lactic acidosis --Rocephin  and Zithromax  --Mucolytics, antipyretics PRN --SLP consult for swallow evaluation --NPO remains due to persistent encephalopathy --Monitor off IV fluids for prognostic purposes --Supplement O2 if sats < 90% on room air  Inadequate PO intake - due to encephalopathy, pt remains NPO since admission.  6/3 -- still NPO since admission. SLP signed off since mentation has not improved, pt not alert or awake, unsafe to attempt PO trials. --Has been on IV fluids since admission, monitor off fluids for now --SLP consulted --Palliative care consult to address GOC given underlying advanced dementia, prognosis overall is poor --[facility] liaison also following  Hypokalemia - K 3.4. Last Mg normal 2.0 --IV K rider x 3 to replace again today --Monitor BMP   Severe constipation, failed outpatient management 6/1 - wife  reports pt had BM this AM after suppository last night --Daily suppository if no BM in > 24 hrs --Further enemas PRN --Scheduled Senna-S BID when alert enough for PO meds   History of ischemic cardiomyopathy Chronic HFrEF Last Echo 4/17/256 - EF 55-60%, grade I DD, mild MR --Monitor volume status  --Holding PO BP meds for now, NPO due to persistent AMS --PRN hydralazine  --Resume meds when appropriate  Cholecystitis with cholecystostomy drain in place --Continue drain care and flushes BID --Monitor drain output   Insulin -dependent type 2 diabetes --Sliding scale Novolog  --Titrate regimen   Physical Deconditioning --PT evaluation --Fall precautions  Advanced dementia --Continue Namenda    Obesity - Body mass index is 31.84 kg/m. Complicates overall care and prognosis.  Recommend lifestyle modifications including physical activity and diet for weight loss and overall long-term health.       Subjective: Pt sleeping this AM, wife at bedside.  Wife reports yesterday he was more awake than prior days.  Wife reports SLP did not come in yesterday, asks for them to see him today.   Physical Exam: Vitals:   08/20/23 1527 08/20/23 2019 08/21/23 0404 08/21/23 0736  BP: (!) 147/80 134/80 113/66 (!) 146/84  Pulse: 79 70 81 (!) 41  Resp: 19 16 18 16   Temp:  99.6 F (37.6 C) 99.6 F (37.6 C) 99.5 F (37.5 C)  TempSrc:  Oral Oral Oral  SpO2: 98% 96% 95% 96%  Weight:      Height:       General exam: sleeping comfortably, wakes up and is more responsive today to verbal stimulus, no acute distress HEENT: opens eyes closed, moist mucus membranes, hearing grossly normal  Respiratory system: scattered rhonchi vs referred upper airway sounds, no wheezes, normal respiratory effort.  Cardiovascular system: normal S1/S2, RRR, no pedal edema.   Gastrointestinal system: soft, NT, ND, +bowel sounds, biliary drain in place. Central nervous system: exam limited by advanced dementia,  non-verbal, no gross focal neurologic deficits, squeezes my  hands and grip seems symmetric Skin: dry, intact, normal temperature    Data Reviewed:  Notable labs --   K 3.4, Glucose 117, Ca 8.5  CBC with WBC 19.4 >> 18 >> 14.2 >> 12.9 11.8 improving Hbg 13.3 >> 11.4 (likely dilution from sepsis IV fluids) >> 10.7 >> 10.3 >> 10.9 stable    Family Communication: wife at bedside on AM rounds daily  Disposition: Status is: Inpatient Remains inpatient appropriate because: remains on IV antibiotics pending furhter clinical improvement, still NPO due persistent lethargy pending SLP swallow evaluation   Planned Discharge Destination: Home    Time spent: 38 minutes  Author: Montey Apa, DO 08/21/2023 9:50 AM  For on call review [email or web address].

## 2023-08-21 NOTE — Plan of Care (Signed)
  Problem: Fluid Volume: Goal: Ability to maintain a balanced intake and output will improve Outcome: Progressing   Problem: Nutritional: Goal: Maintenance of adequate nutrition will improve Outcome: Progressing Goal: Progress toward achieving an optimal weight will improve Outcome: Progressing   Problem: Metabolic: Goal: Ability to maintain appropriate glucose levels will improve Outcome: Progressing   Problem: Skin Integrity: Goal: Risk for impaired skin integrity will decrease Outcome: Progressing   Problem: Tissue Perfusion: Goal: Adequacy of tissue perfusion will improve Outcome: Progressing   Problem: Clinical Measurements: Goal: Ability to maintain clinical measurements within normal limits will improve Outcome: Progressing   Problem: Elimination: Goal: Will not experience complications related to bowel motility Outcome: Progressing Goal: Will not experience complications related to urinary retention Outcome: Progressing

## 2023-08-22 DIAGNOSIS — G9341 Metabolic encephalopathy: Secondary | ICD-10-CM | POA: Diagnosis not present

## 2023-08-22 DIAGNOSIS — J189 Pneumonia, unspecified organism: Secondary | ICD-10-CM | POA: Diagnosis not present

## 2023-08-22 DIAGNOSIS — R638 Other symptoms and signs concerning food and fluid intake: Secondary | ICD-10-CM | POA: Diagnosis not present

## 2023-08-22 DIAGNOSIS — A419 Sepsis, unspecified organism: Secondary | ICD-10-CM | POA: Diagnosis not present

## 2023-08-22 DIAGNOSIS — G309 Alzheimer's disease, unspecified: Secondary | ICD-10-CM | POA: Diagnosis not present

## 2023-08-22 DIAGNOSIS — R6520 Severe sepsis without septic shock: Secondary | ICD-10-CM

## 2023-08-22 DIAGNOSIS — J181 Lobar pneumonia, unspecified organism: Secondary | ICD-10-CM

## 2023-08-22 DIAGNOSIS — K5900 Constipation, unspecified: Secondary | ICD-10-CM | POA: Insufficient documentation

## 2023-08-22 DIAGNOSIS — E785 Hyperlipidemia, unspecified: Secondary | ICD-10-CM

## 2023-08-22 DIAGNOSIS — E119 Type 2 diabetes mellitus without complications: Secondary | ICD-10-CM

## 2023-08-22 DIAGNOSIS — K5909 Other constipation: Secondary | ICD-10-CM

## 2023-08-22 DIAGNOSIS — E876 Hypokalemia: Secondary | ICD-10-CM

## 2023-08-22 LAB — BASIC METABOLIC PANEL WITH GFR
Anion gap: 12 (ref 5–15)
BUN: 14 mg/dL (ref 8–23)
CO2: 24 mmol/L (ref 22–32)
Calcium: 8.8 mg/dL — ABNORMAL LOW (ref 8.9–10.3)
Chloride: 105 mmol/L (ref 98–111)
Creatinine, Ser: 0.77 mg/dL (ref 0.61–1.24)
GFR, Estimated: >60 mL/min (ref >60)
Glucose, Bld: 118 mg/dL — ABNORMAL HIGH (ref 70–99)
Potassium: 3.3 mmol/L — ABNORMAL LOW (ref 3.5–5.1)
Sodium: 141 mmol/L (ref 135–145)

## 2023-08-22 LAB — CBC
HCT: 36.4 % — ABNORMAL LOW (ref 39.0–52.0)
Hemoglobin: 11.1 g/dL — ABNORMAL LOW (ref 13.0–17.0)
MCH: 27.6 pg (ref 26.0–34.0)
MCHC: 30.5 g/dL (ref 30.0–36.0)
MCV: 90.5 fL (ref 80.0–100.0)
Platelets: 328 10*3/uL (ref 150–400)
RBC: 4.02 MIL/uL — ABNORMAL LOW (ref 4.22–5.81)
RDW: 13.2 % (ref 11.5–15.5)
WBC: 10.9 10*3/uL — ABNORMAL HIGH (ref 4.0–10.5)
nRBC: 0 % (ref 0.0–0.2)

## 2023-08-22 LAB — CULTURE, BLOOD (ROUTINE X 2)
Culture: NO GROWTH
Culture: NO GROWTH

## 2023-08-22 LAB — GLUCOSE, CAPILLARY
Glucose-Capillary: 117 mg/dL — ABNORMAL HIGH (ref 70–99)
Glucose-Capillary: 111 mg/dL — ABNORMAL HIGH (ref 70–99)

## 2023-08-22 MED ORDER — KCL IN DEXTROSE-NACL 20-5-0.9 MEQ/L-%-% IV SOLN
INTRAVENOUS | Status: DC
  Filled 2023-08-22 (×3): qty 1000

## 2023-08-22 MED ORDER — POTASSIUM CHLORIDE IN NACL 20-0.9 MEQ/L-% IV SOLN
INTRAVENOUS | Status: DC
  Filled 2023-08-22: qty 1000

## 2023-08-22 NOTE — Assessment & Plan Note (Addendum)
 Trial of a diet today with dysphagia 1 diet with thin liquids.

## 2023-08-22 NOTE — Assessment & Plan Note (Signed)
 Present on admission with left lower lobe pneumonia, leukocytosis tachycardia and tachypnea.  Patient also had acute metabolic encephalopathy.  Completed 5 days of Rocephin  and Zithromax .

## 2023-08-22 NOTE — Assessment & Plan Note (Addendum)
 Patient has cholecystectomy drain.  Patient's wife states that she has an appointment for the drain clinic and she knows how to flush the drain.

## 2023-08-22 NOTE — Plan of Care (Signed)
  Problem: Metabolic: Goal: Ability to maintain appropriate glucose levels will improve Outcome: Progressing   Problem: Skin Integrity: Goal: Risk for impaired skin integrity will decrease Outcome: Progressing   Problem: Tissue Perfusion: Goal: Adequacy of tissue perfusion will improve Outcome: Progressing   Problem: Clinical Measurements: Goal: Respiratory complications will improve Outcome: Progressing Goal: Cardiovascular complication will be avoided Outcome: Progressing   Problem: Elimination: Goal: Will not experience complications related to bowel motility Outcome: Progressing Goal: Will not experience complications related to urinary retention Outcome: Progressing   Problem: Safety: Goal: Ability to remain free from injury will improve Outcome: Progressing   Problem: Skin Integrity: Goal: Risk for impaired skin integrity will decrease Outcome: Progressing

## 2023-08-22 NOTE — Assessment & Plan Note (Addendum)
 Would hold Glucophage and just do diet control.

## 2023-08-22 NOTE — Progress Notes (Signed)
   08/22/23 1615  Spiritual Encounters  Type of Visit Follow up  Care provided to: Family  Reason for visit Routine spiritual support  OnCall Visit No   Chaplain saw patient's wife sitting bedside as Chaplain was on the Unit.  Chaplain asked how patient was doing and wife shared that OT will be coming to see if the patient is able to eat.  Chaplain asked wife is her daughters would be coming in today (to remind her of self-care and respite).  Chaplain reminded wife that [employer] is available, if needed.    Rev. Rana M. Nolon Baxter, M.Div. Chaplain Resident [facility]

## 2023-08-22 NOTE — Progress Notes (Signed)
 Progress Note   Patient: Jeff Key:[id number] DOB: September 09, 1945 DOA: 08/17/2023     4 DOS: the patient was seen and examined on 08/22/2023   Brief hospital course: 78 y.o. 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   Patient was found to have left lower lobe pneumonia and was admitted for further evaluation and management including IV antibiotics.  6/4.  Patient completed 5 days of Rocephin  and Zithromax  for pneumonia and sepsis.  Still with altered mental status.  Patient was able to open up his eyes and squeeze my hands.  Assessment and Plan: * Severe sepsis (HCC) Present on admission with left lower lobe pneumonia, leukocytosis tachycardia and tachypnea.  Patient also had acute metabolic encephalopathy.  Completed 5 days of Rocephin  and Zithromax .  Lobar pneumonia (HCC) Completed 5 days of Rocephin  and Zithromax .  Acute metabolic encephalopathy Patient has underlying dementia.  Mental status has not improved back to his baseline yet but better the last couple days as per the wife.  Inadequate oral intake Speech pathology to reevaluate to see if he is able to swallow.  He has been n.p.o. since admission.  Type 2 diabetes mellitus (HCC) Sugars controlled but patient NPO.  Will add D5 to IV fluids.  Constipation Limited with oral meds  Hypokalemia Replace potassium and IV fluids  Acute cholecystitis Patient has cholecystectomy drain.  Alzheimer's dementia without behavioral disturbance (HCC) Unable to take oral meds currently        Subjective: Patient opens eyes.  Was able to squeeze my hands bilaterally.  Was not able to talk.  Admitted with altered mental  status.  Physical Exam: Vitals:   08/21/23 1552 08/21/23 1951 08/22/23 0415 08/22/23 0732  BP: (!) 144/72 (!) 150/80 (!) 150/92 (!) 150/92  Pulse: 77 71 80 65  Resp: 15 18 16 16   Temp: 97.6 F (36.4 C) 98.5 F (36.9 C) 98 F (36.7 C) 98.3 F (36.8 C)  TempSrc: Oral Oral Oral   SpO2: 96% 96% 95% 96%  Weight:      Height:       Physical Exam HENT:     Head: Normocephalic.     Mouth/Throat:     Pharynx: No oropharyngeal exudate.  Eyes:     General: Lids are normal.     Conjunctiva/sclera: Conjunctivae normal.  Cardiovascular:     Rate and Rhythm: Normal rate and regular rhythm.     Heart sounds: Normal heart sounds, S1 normal and S2 normal.  Pulmonary:     Breath sounds: No decreased breath sounds, wheezing, rhonchi or rales.  Abdominal:     Palpations: Abdomen is soft.     Tenderness: There is no abdominal tenderness.  Musculoskeletal:     Right lower leg: No swelling.     Left lower leg: No swelling.  Skin:    General: Skin is warm.     Findings: No rash.  Neurological:     Mental Status: He is lethargic.     Comments: Patient opened up his eyes.  Was able to squeeze with both hands.     Data Reviewed: Sodium 141, potassium 3.3, creatinine 0.77, white blood cell count 10.9, hemoglobin 11.1, platelet count 328  Family Communication: Spoke with wife at the bedside  Disposition: Status  is: Inpatient Speech pathology to reevaluate to see if patient can be started on a diet.  Overall prognosis poor if he is unable to keep up with nutritional needs.  Planned Discharge Destination: To be determined    Time spent: 28 minutes  Author: Verla Glaze, MD 08/22/2023 1:18 PM  For on call review [email or web address].

## 2023-08-22 NOTE — Progress Notes (Signed)
 Palliative Care Progress Note, Assessment & Plan   Patient Name: Jeff Key       Date: 08/22/2023 DOB: [birth year]  Age: 78 y.o. MRN#: [id number] Attending Physician: Verla Glaze, MD Primary Care Physician: Antonio Baumgarten, MD Admit Date: 08/17/2023  Subjective: Patient is lying in bed, sleeping, in no apparent distress.  I attempted to awaken him but he remained asleep for entirety of my visit.  Patient's wife is at bedside.  HPI: 78 y.o. male  with past medical history significant for advanced dementia, non-verbal at baseline, CAD, ischemic cardiomyopathy, HFrEF (45/50%),chronic ulcerated proctosigmoiditis, recent cholecystitis s/p cholecystostomy drain, IIDM and obesity. Pt presented to ED  08/17/2023 with complaint of altered mental status and weakness.    ED workup noted WBC 19, Hgb 13, TSH 2.5/2.4, BUN 15, creatinine 0.8. CXR suspicious for LLL PNA. CT AP showed LLL infiltrates versus atelectasis. CT head negative. BP 121/82, HR 106, RR 24, 97% RA, 99.1   Patient was admitted to [facility] for management of CAP and acute metabolic encephalopathy.    Palliative was consulted for goals of care conversation.   Summary of counseling/coordination of care: Extensive chart review completed prior to meeting patient including labs, vital signs, imaging, progress notes, orders, and available advanced directive documents from current and previous encounters.   After reviewing the patient's chart and assessing the patient at bedside, I spoke with patient in regards to symptom management and goals of care.   Symptoms assessed.  Patient's wife endorses that patient was weak this morning.  She is asking that SLP reevaluate patient.  Discussed that patient in current state is not awake and alert enough to even  participate in a SLP evaluation.  However, SLP has been notified and plans to come bedside to speak with patient and wife today.  She was appreciative of this update.  Again discussed chronic, progressive, and irreversible nature of dementia that is oftentimes exacerbated by acute illnesses and hospitalizations.  Patient's wife shares that she is a caregiver, wants to continue to take care of him, and is hopeful that he can "bounce back".  Therapeutic silence, active listening, and emotional support provided.  No adjustment to plan of care at this time.  PMT will continue to follow and support.  Physical Exam Vitals reviewed.  Constitutional:      General: He is not in acute distress. HENT:     Nose: Nose normal.     Mouth/Throat:     Mouth: Mucous membranes are moist.  Cardiovascular:     Pulses: Normal pulses.  Pulmonary:     Effort: Pulmonary effort is normal.  Abdominal:     Palpations: Abdomen is soft.  Skin:    General: Skin is warm.     Coloration: Skin is pale.  Neurological:     Comments: Non verbal, does not open eyes to verbal or physical stimuli             Total Time 35 minutes   Time spent includes: Detailed review of medical records (labs, imaging, vital signs), medically appropriate exam (mental status, respiratory, cardiac, skin), discussed with treatment team, counseling and educating patient, family and staff, documenting clinical information, medication management and coordination  of care.  Judeen Nose L. Rebbeca Campi, DNP, FNP-BC Palliative Medicine Team

## 2023-08-22 NOTE — Hospital Course (Addendum)
 78 y.o. 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   Patient was found to have left lower lobe pneumonia and was admitted for further evaluation and management including IV antibiotics.  6/4.  Patient completed 5 days of Rocephin  and Zithromax  for pneumonia and sepsis.  Still with altered mental status.  Patient was able to open up his eyes and squeeze my hands. 6/5.  Patient did not do well with swallow evaluation today.  Continue to monitor. 6/7.  Patient seen in conjunction with speech pathology and patient placed on dysphagia 1 diet with thin liquids.  Will see how he does.  High risk for aspiration and also may not keep up with nutritional needs.  Wife amenable to hospice following at home.  IV infiltration with some erythema will start Ancef. 6/8.  Since wife said he did okay with the diet yesterday we will stop fluids today and see how he does with the diet today. 6/9.  Patient's wife states that he ate okay yesterday and willing to go home today with hospice.

## 2023-08-22 NOTE — Assessment & Plan Note (Addendum)
 Patient given Dup[place]ac  suppositories while here.  Can go back on Linzess.

## 2023-08-22 NOTE — TOC Progression Note (Signed)
 Transition of Care [facility]) - Progression Note    Patient Details  Name: Jeff Key MRN: [id number] Date of Birth: 09/29/1945  Transition of Care [facility]) CM/SW Contact  Crayton Docker, RN 08/22/2023, 5:21 PM  Clinical Narrative:     CM to patient's room regarding discharge care planning. Patient's wife, at patient's bedside. Per patient's wife, [facility] is active in the home for RN services. Per patient's wife, patient returns home via EMS. CM secure message to [facility] regarding inpatient admissions and expected date of discharge. Per Randel Buss, agency is following.   Expected Discharge Plan: Skilled Nursing Facility Barriers to Discharge: Continued Medical Work up  Expected Discharge Plan and Services     Post Acute Care Choice: Home Health Living arrangements for the past 2 months: Single Family Home        HH Arranged: RN HH Agency: [facility]  Social Determinants of Health (SDOH) Interventions SDOH Screenings   Food Insecurity: Patient Unable To Answer (08/17/2023)  Housing: Patient Unable To Answer (08/17/2023)  Transportation Needs: Patient Unable To Answer (08/17/2023)  Utilities: Patient Unable To Answer (08/17/2023)  Financial Resource Strain: Low Risk  (07/13/2022)   Received from [facility], [facility]  Social Connections: Unknown (08/17/2023)  Tobacco Use: Medium Risk (08/17/2023)    Readmission Risk Interventions     No data to display

## 2023-08-22 NOTE — Assessment & Plan Note (Addendum)
 Patient has underlying dementia.  Yesterday he was placed on a dysphagia 1 diet with thin liquids as per speech pathology.  Still high risk of aspiration and not keeping up with nutritional needs.  Wife amenable to hospice.  Will discontinue fluids today and see how he does with the diet.

## 2023-08-22 NOTE — Assessment & Plan Note (Addendum)
 Trial of diet today dysphagia 1 diet with thin liquids

## 2023-08-22 NOTE — Assessment & Plan Note (Addendum)
 Replaced

## 2023-08-22 NOTE — Assessment & Plan Note (Signed)
 Concern of pneumonia on admission but procalcitonin negative. History of lung nodule patient need outpatient PET scan on most recent hospitalization A video bronchoscopy scheduled outpatient for 12/11 -Continue with Zithromax for concern of COPD exacerbation -Continuing ceftriaxone for concern of UTI

## 2023-08-22 NOTE — Plan of Care (Signed)
  Problem: Coping: Goal: Ability to adjust to condition or change in health will improve Outcome: Progressing   Problem: Fluid Volume: Goal: Ability to maintain a balanced intake and output will improve Outcome: Progressing   Problem: Metabolic: Goal: Ability to maintain appropriate glucose levels will improve Outcome: Progressing   Problem: Nutritional: Goal: Maintenance of adequate nutrition will improve Outcome: Progressing Goal: Progress toward achieving an optimal weight will improve Outcome: Progressing   Problem: Skin Integrity: Goal: Risk for impaired skin integrity will decrease Outcome: Progressing   Problem: Tissue Perfusion: Goal: Adequacy of tissue perfusion will improve Outcome: Progressing   Problem: Education: Goal: Knowledge of General Education information will improve Description: Including pain rating scale, medication(s)/side effects and non-pharmacologic comfort measures Outcome: Progressing   Problem: Health Behavior/Discharge Planning: Goal: Ability to manage health-related needs will improve Outcome: Progressing   Problem: Clinical Measurements: Goal: Ability to maintain clinical measurements within normal limits will improve Outcome: Progressing Goal: Will remain free from infection Outcome: Progressing Goal: Diagnostic test results will improve Outcome: Progressing Goal: Respiratory complications will improve Outcome: Progressing Goal: Cardiovascular complication will be avoided Outcome: Progressing   Problem: Activity: Goal: Risk for activity intolerance will decrease Outcome: Progressing   Problem: Nutrition: Goal: Adequate nutrition will be maintained Outcome: Progressing   Problem: Coping: Goal: Level of anxiety will decrease Outcome: Progressing   Problem: Elimination: Goal: Will not experience complications related to bowel motility Outcome: Progressing Goal: Will not experience complications related to urinary  retention Outcome: Progressing   Problem: Pain Managment: Goal: General experience of comfort will improve and/or be controlled Outcome: Progressing   Problem: Safety: Goal: Ability to remain free from injury will improve Outcome: Progressing   Problem: Skin Integrity: Goal: Risk for impaired skin integrity will decrease Outcome: Progressing

## 2023-08-23 DIAGNOSIS — J181 Lobar pneumonia, unspecified organism: Secondary | ICD-10-CM | POA: Diagnosis not present

## 2023-08-23 DIAGNOSIS — G309 Alzheimer's disease, unspecified: Secondary | ICD-10-CM | POA: Diagnosis not present

## 2023-08-23 DIAGNOSIS — J189 Pneumonia, unspecified organism: Secondary | ICD-10-CM | POA: Diagnosis not present

## 2023-08-23 DIAGNOSIS — G9341 Metabolic encephalopathy: Secondary | ICD-10-CM | POA: Diagnosis not present

## 2023-08-23 DIAGNOSIS — R4182 Altered mental status, unspecified: Secondary | ICD-10-CM | POA: Diagnosis not present

## 2023-08-23 DIAGNOSIS — R638 Other symptoms and signs concerning food and fluid intake: Secondary | ICD-10-CM | POA: Diagnosis not present

## 2023-08-23 DIAGNOSIS — I2510 Atherosclerotic heart disease of native coronary artery without angina pectoris: Secondary | ICD-10-CM

## 2023-08-23 DIAGNOSIS — A419 Sepsis, unspecified organism: Secondary | ICD-10-CM | POA: Diagnosis not present

## 2023-08-23 LAB — BASIC METABOLIC PANEL WITH GFR
Anion gap: 10 (ref 5–15)
BUN: 14 mg/dL (ref 8–23)
CO2: 26 mmol/L (ref 22–32)
Calcium: 8.7 mg/dL — ABNORMAL LOW (ref 8.9–10.3)
Chloride: 105 mmol/L (ref 98–111)
Creatinine, Ser: 0.7 mg/dL (ref 0.61–1.24)
GFR, Estimated: >60 mL/min (ref >60)
Glucose, Bld: 137 mg/dL — ABNORMAL HIGH (ref 70–99)
Potassium: 3.4 mmol/L — ABNORMAL LOW (ref 3.5–5.1)
Sodium: 141 mmol/L (ref 135–145)

## 2023-08-23 LAB — GLUCOSE, CAPILLARY
Glucose-Capillary: 119 mg/dL — ABNORMAL HIGH (ref 70–99)
Glucose-Capillary: 128 mg/dL — ABNORMAL HIGH (ref 70–99)
Glucose-Capillary: 129 mg/dL — ABNORMAL HIGH (ref 70–99)
Glucose-Capillary: 133 mg/dL — ABNORMAL HIGH (ref 70–99)
Glucose-Capillary: 139 mg/dL — ABNORMAL HIGH (ref 70–99)
Glucose-Capillary: 141 mg/dL — ABNORMAL HIGH (ref 70–99)

## 2023-08-23 LAB — MAGNESIUM: Magnesium: 2 mg/dL (ref 1.7–2.4)

## 2023-08-23 MED ORDER — KCL IN DEXTROSE-NACL 20-5-0.9 MEQ/L-%-% IV SOLN
INTRAVENOUS | Status: DC
  Filled 2023-08-23 (×2): qty 1000

## 2023-08-23 NOTE — TOC Progression Note (Signed)
 Transition of Care [facility]) - Progression Note    Patient Details  Name: Jeff Key MRN: [id number] Date of Birth: 09-02-1945  Transition of Care [facility]) CM/SW Contact  Crayton Docker, RN 08/23/2023, 1:49 PM  Clinical Narrative:     Chart reviewed. Patient expected medical readiness for discharge is 08/27/2023. CM will continue to follow for discharge care planning needs. Patient will require EMS transport home.   Expected Discharge Plan: Skilled Nursing Facility Barriers to Discharge: Continued Medical Work up  Expected Discharge Plan and Services     Post Acute Care Choice: Home Health Living arrangements for the past 2 months: Single Family Home      HH Arranged: RN HH Agency: [facility]   Social Determinants of Health (SDOH) Interventions SDOH Screenings   Food Insecurity: Patient Unable To Answer (08/17/2023)  Housing: Patient Unable To Answer (08/17/2023)  Transportation Needs: Patient Unable To Answer (08/17/2023)  Utilities: Patient Unable To Answer (08/17/2023)  Financial Resource Strain: Low Risk  (07/13/2022)   Received from [facility], [facility]  Social Connections: Unknown (08/17/2023)  Tobacco Use: Medium Risk (08/17/2023)    Readmission Risk Interventions     No data to display

## 2023-08-23 NOTE — Evaluation (Addendum)
 Clinical/Bedside Swallow Evaluation Patient Details  Name: Jeff Key MRN: [id number] Date of Birth: March 29, 1945  Today's Date: 08/23/2023 Time: SLP Start Time (ACUTE ONLY): 0900 SLP Stop Time (ACUTE ONLY): 0925 SLP Time Calculation (min) (ACUTE ONLY): 25 min  Past Medical History:  Past Medical History:  Diagnosis Date   Anemia ~ 11/2015   CAD (coronary artery disease)    a. inf STEMI on 02/10/16. RCA treated with DES followed by staged PCI of LAD and Circumflex 02/14/16.   Dementia (HCC) dx'd 01/2016   GERD (gastroesophageal reflux disease)    History of stomach ulcers 1980s?   Ischemic cardiomyopathy    a. 01/2016: EF 40-45%.    Past Surgical History:  Past Surgical History:  Procedure Laterality Date   CARDIAC CATHETERIZATION N/A 02/10/2016   Procedure: Left Heart Cath and Coronary Angiography;  Surgeon: Avanell Leigh, MD;  Location: [facility] INVASIVE CV LAB;  Service: Cardiovascular;  Laterality: N/A;   CARDIAC CATHETERIZATION N/A 02/10/2016   Procedure: Coronary Stent Intervention;  Surgeon: Avanell Leigh, MD;  Location: [facility] INVASIVE CV LAB;  Service: Cardiovascular;  Laterality: N/A;   CARDIAC CATHETERIZATION N/A 02/14/2016   Procedure: Coronary Stent Intervention-LAD and CFX;  Surgeon: Avanell Leigh, MD;  Location: [facility] INVASIVE CV LAB;  Service: Cardiovascular;  Laterality: N/A;   COLONOSCOPY WITH PROPOFOL  N/A 05/06/2015   Procedure: COLONOSCOPY WITH PROPOFOL ;  Surgeon: Stephens Eis, MD;  Location: [facility] ENDOSCOPY;  Service: Gastroenterology;  Laterality: N/A;   CORONARY ANGIOPLASTY WITH STENT PLACEMENT  02/14/2016   "2 stents"   ESOPHAGOGASTRODUODENOSCOPY (EGD) WITH PROPOFOL  N/A 05/06/2015   Procedure: ESOPHAGOGASTRODUODENOSCOPY (EGD) WITH PROPOFOL ;  Surgeon: Stephens Eis, MD;  Location: [facility] ENDOSCOPY;  Service: Gastroenterology;  Laterality: N/A;   FOREARM FRACTURE SURGERY Right 1985   "got it tore up in [place]"   FRACTURE SURGERY     IR PERC CHOLECYSTOSTOMY  07/05/2023    HPI:  Per H&P: Jeff Key is a 78 y.o. 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     At baseline, patient is nonverbal, due to advanced dementia, he needs to be fed for his meals.  Family member including wife and daughter at bedside reported patient has no history of aspiration before and does not choke or cough after eat or drink.  Yesterday evening after dinner however patient started to be sleepy and breathing heavily, gurgly in her throat but has no cough.  This morning, family again found the patient is sleepy and hard of loss and called EMS.  Patient has a recent cholecystitis status post cholecystostomy insertion on recent hospitalization and wife reported no significant change of output or color of the cholecystotomy recently.  No diarrhea.  But patient has been constipated for 3 to 4 days and wife has tried p.o. Colace as well as OTC enema yesterday without significant issue.  No vomiting as per family . CXR 5/30: Low lung volumes. Retrocardiac airspace opacities, may represent  atelectasis or a developing bronchopneumonia, in the correct  clinical context.    Assessment / Plan / Recommendation  Clinical Impression  Pt seen for bedside swallow assessment in the setting of PNA/reduced alertness for PO intake. At baseline, pt with "advanced dementia," nonverbal and requiring total assist for feeding. BSE completed on 4/18- with recommendation of puree/thin, with pt later advancing  to soft diet. Spouse endorses regular diet at home PTA.   Spouse present at the bedside for duration of session- reporting increased alertness yesterday. Pt lethargic, minimally alerted with use of tactile and thermal stimuli (ice to lips). Pt afebrile and on room  air. Congested breath sounds noted, with report of use of suction to clear. Trials completed of ice x2, with pt initially demonstrating oral movement/manipulation, that was noted to fade as ice continued to melt. Suspect passive movement to posterior oral cavity with delayed swallow initiation. Delayed throat clear noted.  Os saturations maintained at 97-100 for duration of PO trials.   Given lethargic state, recommend continued NPO. Allowance of select ice chips when pt is alert/ready/sitting upright, and following oral care. Explicit instructions shared with spouse regarding instructions. SLP will follow to determine readiness for PO intake- with acute lethargy and baseline dependency with po intake, cognition all increasing pt's risk for aspiration. RN and MD aware of results.    SLP Visit Diagnosis: Dysphagia, unspecified (R13.10)    Aspiration Risk  Moderate aspiration risk    Diet Recommendation   NPO (select ice chips following oral care)  Medication Administration: Via alternative means    Other  Recommendations Recommended Consults:  (continued palliative involvement) Oral Care Recommendations: Oral care prior to ice chip/H20;Oral care QID;Staff/trained caregiver to provide oral care     Assistance Recommended at Discharge    Functional Status Assessment Patient has had a recent decline in their functional status and/or demonstrates limited ability to make significant improvements in function in a reasonable and predictable amount of time (based on comorbities and dependency at baseline)  Frequency and Duration min 2x/week  2 weeks       Prognosis Prognosis for improved oropharyngeal function: Fair Barriers to Reach Goals: Cognitive deficits;Motivation (limited alertness)      Swallow Study   General Date of Onset: 08/23/23 HPI: Per H&P: Jeff Key is a 78 y.o. 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     At baseline, patient is nonverbal, due to advanced dementia, he needs to be fed for his meals.  Family member including wife and daughter at bedside reported patient has no history of aspiration before and does not choke or cough after eat or drink.  Yesterday evening after dinner however patient started to be sleepy and breathing heavily, gurgly in her throat but has no cough.  This morning, family again found the patient is sleepy and hard of loss and called EMS.  Patient has a recent cholecystitis status post cholecystostomy insertion on recent hospitalization and wife reported no significant change of output or color of the cholecystotomy recently.  No diarrhea.  But patient has been constipated for 3 to 4 days and wife has tried p.o. Colace as well as OTC enema yesterday without significant issue.  No vomiting as per family . CXR 5/30: Low lung volumes. Retrocardiac airspace opacities, may represent  atelectasis or a developing bronchopneumonia, in the correct  clinical context. Type of Study: Bedside Swallow Evaluation Previous Swallow Assessment: BSE on 4/18 with recommendation for puree and thin- pt advanced to thins and GI/soft diet by time of discharge. Diet Prior to this Study: NPO Temperature Spikes Noted: No (WBC 10.9) Respiratory Status: Room air History of Recent Intubation: No Behavior/Cognition: Lethargic/Drowsy Oral Cavity Assessment: Within  Functional Limits Oral Care Completed by SLP: Recent completion by staff Oral Cavity - Dentition: Dentures, top;Missing dentition Vision:  (n/a) Self-Feeding Abilities: Total assist Patient Positioning: Upright in bed Baseline Vocal Quality: Not observed (non verbal at baseline) Volitional Cough: Cognitively unable to elicit Volitional Swallow:  Unable to elicit    Oral/Motor/Sensory Function Overall Oral Motor/Sensory Function:  (no overt oral motor weakness)   Ice Chips Ice chips: Impaired Presentation: Spoon (total assist) Oral Phase Impairments: Poor awareness of bolus;Reduced lingual movement/coordination Oral Phase Functional Implications: Prolonged oral transit Pharyngeal Phase Impairments: Suspected delayed Swallow;Wet Vocal Quality;Throat Clearing - Delayed   Thin Liquid Thin Liquid: Not tested    Nectar Thick Nectar Thick Liquid: Not tested   Honey Thick Honey Thick Liquid: Not tested   Puree Puree: Not tested   Solid     Solid: Not tested     [place] Elvy Mclarty Clapp, MS, CCC-SLP Speech Language Pathologist Rehab Services; [facility] [phone] (ascom)   [place] J Clapp 08/23/2023,10:51 AM

## 2023-08-23 NOTE — Progress Notes (Signed)
 Progress Note   Patient: Jeff Key WGN:[id number] DOB: Nov 16, 1945 DOA: 08/17/2023     5 DOS: the patient was seen and examined on 08/23/2023   Brief hospital course: 78 y.o. 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   Patient was found to have left lower lobe pneumonia and was admitted for further evaluation and management including IV antibiotics.  6/4.  Patient completed 5 days of Rocephin  and Zithromax  for pneumonia and sepsis.  Still with altered mental status.  Patient was able to open up his eyes and squeeze my hands.  Assessment and Plan: * Severe sepsis (HCC) Present on admission with left lower lobe pneumonia, leukocytosis tachycardia and tachypnea.  Patient also had acute metabolic encephalopathy.  Completed 5 days of Rocephin  and Zithromax .  Lobar pneumonia (HCC) Completed 5 days of Rocephin  and Zithromax .  Acute metabolic encephalopathy Patient has underlying dementia.  Mental status has not improved back to his baseline.  Patient did not do well with swallow evaluation.  Patient would be a candidate for hospice.  Inadequate oral intake Did not do well with speech pathology today.  Type 2 diabetes mellitus (HCC) Sugars controlled but patient NPO.  Will add D5 to IV fluids.  Constipation Limited with oral meds  Hypokalemia Replace potassium in IV fluids  Pressure injury Right anterior foot stage I.  See full description below.  Present on admission.  Acute cholecystitis Patient has cholecystectomy drain.  Alzheimer's dementia without behavioral disturbance (HCC) Unable to take oral meds currently        Subjective: Patient opens eyes to stimulation and  was able to squeeze my hand and wiggle his toes to command.  Mated with altered mental status and pneumonia.  Physical Exam: Vitals:   08/22/23 1525 08/22/23 2059 08/23/23 0314 08/23/23 0723  BP: 135/85 130/84 (!) 144/89 (!) 162/80  Pulse: 71 73 73 74  Resp: 16 15 17 16   Temp: 98.1 F (36.7 C) 98.6 F (37 C)  (!) 96.5 F (35.8 C)  TempSrc:  Oral    SpO2: 99% 98% 94% 95%  Weight:      Height:       Physical Exam HENT:     Head: Normocephalic.     Mouth/Throat:     Pharynx: No oropharyngeal exudate.  Eyes:     General: Lids are normal.     Conjunctiva/sclera: Conjunctivae normal.  Cardiovascular:     Rate and Rhythm: Normal rate and regular rhythm.     Heart sounds: Normal heart sounds, S1 normal and S2 normal.  Pulmonary:     Breath sounds: No decreased breath sounds, wheezing, rhonchi or rales.  Abdominal:     Palpations: Abdomen is soft.     Tenderness: There is no abdominal tenderness.  Musculoskeletal:     Right lower leg: No swelling.     Left lower leg: No swelling.  Skin:    General: Skin is warm.     Findings: No rash.  Neurological:     Mental Status: He is lethargic.     Comments: Patient opened up his eyes.  Was able to squeeze with both hands and wiggle his toes     Data Reviewed: Potassium 3.4, creatinine 0.7  Family Communication: Spoke with wife at bedside  Disposition: Status is: Inpatient Remains inpatient appropriate because: Patient did not do well with swallow evaluation today.  Overall prognosis poor secondary to unable to swallow.  Continue to monitor.  Planned Discharge Destination: To be determined    Time spent: 27 minutes Case discussed with speech therapy and palliative care  Author: Verla Glaze, MD 08/23/2023 12:45 PM  For on call review [email or web address].

## 2023-08-23 NOTE — Assessment & Plan Note (Signed)
 Right anterior foot stage I.  See full description below.  Present on admission.

## 2023-08-23 NOTE — Plan of Care (Signed)
   Problem: Coping: Goal: Ability to adjust to condition or change in health will improve Outcome: Progressing   Problem: Metabolic: Goal: Ability to maintain appropriate glucose levels will improve Outcome: Progressing

## 2023-08-23 NOTE — Progress Notes (Signed)
 Palliative Care Progress Note, Assessment & Plan   Patient Name: Jeff Key       Date: 08/23/2023 DOB: Oct 17, 1945  Age: 78 y.o. MRN#: [id number] Attending Physician: Verla Glaze, MD Primary Care Physician: Antonio Baumgarten, MD Admit Date: 08/17/2023  Subjective: Patient is lying in bed, resting, in no apparent distress.  He does not awaken to my verbal or light physical stimuli.  His eyes remain closed during the entirety of my visit, though patient's wife shares patient is awake but keeps his eyes closed.  Patient's wife, granddaughter, and 76-month-old great grandchild are at bedside during my visit.  HPI: 78 y.o. male  with past medical history significant for advanced dementia, non-verbal at baseline, CAD, ischemic cardiomyopathy, HFrEF (45/50%),chronic ulcerated proctosigmoiditis, recent cholecystitis s/p cholecystostomy drain, IIDM and obesity. Pt presented to ED  08/17/2023 with complaint of altered mental status and weakness.    ED workup noted WBC 19, Hgb 13, TSH 2.5/2.4, BUN 15, creatinine 0.8. CXR suspicious for LLL PNA. CT AP showed LLL infiltrates versus atelectasis. CT head negative. BP 121/82, HR 106, RR 24, 97% RA, 99.1   Patient was admitted to [facility] for management of CAP and acute metabolic encephalopathy.    Palliative was consulted for goals of care conversation.   Summary of counseling/coordination of care: Extensive chart review completed prior to meeting patient including labs, vital signs, imaging, progress notes, orders, and available advanced directive documents from current and previous encounters.   After reviewing the patient's chart and assessing the patient at bedside, I spoke with patient in regards to symptom management and goals of care.   Reviewed that SLP  reevaluated patient and he remains n.p.o. with ice chips when awake, alert, and able to take n.p.o.  Patient's wife shares she believes he can "do better than this".  She shares that perhaps once he is home he will be able to eat more.  Discussed remaining hopeful that patient could have increased p.o. intake.  However, also discussed reality that patient is not taking in enough calories to sustain his life and likely facing end-of-life.  Encouraged wife to reconsider enacting hospice benefits.  She shares she will think about it, knows that they are there, and hopes to get him home and settled soon.   Conveyed above discussion to attending Dr. Clelia Current.  No adjustment to plan of care at this time.  Wife shares she would like to have patient home soon and remains hopeful that he will have increased p.o. intake.  Symptom burden is low.  Goals are clear.  No acute palliative needs at this time.  I conveyed to patient's wife that PMT will step back from daily visits.  She has her contact info and was encouraged to reach out with any acute palliative needs during this hospitalization.  Please reengage with PMT when goals change, at patient/family's request, or if patient's health deteriorates during hospitalization.  Physical Exam Vitals reviewed.  Constitutional:      General: He is not in acute distress.    Appearance: He is ill-appearing.  HENT:     Head: Normocephalic.     Mouth/Throat:     Mouth: Mucous membranes are moist.  Cardiovascular:  Rate and Rhythm: Normal rate.     Pulses: Normal pulses.  Pulmonary:     Effort: Pulmonary effort is normal.  Skin:    Coloration: Skin is pale.  Neurological:     Comments: nonverbal             Total Time 35 minutes   Time spent includes: Detailed review of medical records (labs, imaging, vital signs), medically appropriate exam (mental status, respiratory, cardiac, skin), discussed with treatment team, counseling and educating patient,  family and staff, documenting clinical information, medication management and coordination of care.  Judeen Nose L. Rebbeca Campi, DNP, FNP-BC Palliative Medicine Team

## 2023-08-24 ENCOUNTER — Inpatient Hospital Stay

## 2023-08-24 DIAGNOSIS — R638 Other symptoms and signs concerning food and fluid intake: Secondary | ICD-10-CM | POA: Diagnosis not present

## 2023-08-24 DIAGNOSIS — G9341 Metabolic encephalopathy: Secondary | ICD-10-CM | POA: Diagnosis not present

## 2023-08-24 DIAGNOSIS — A419 Sepsis, unspecified organism: Secondary | ICD-10-CM | POA: Diagnosis not present

## 2023-08-24 DIAGNOSIS — J181 Lobar pneumonia, unspecified organism: Secondary | ICD-10-CM | POA: Diagnosis not present

## 2023-08-24 LAB — URINALYSIS, COMPLETE (UACMP) WITH MICROSCOPIC
Bilirubin Urine: NEGATIVE
Glucose, UA: NEGATIVE mg/dL
Ketones, ur: NEGATIVE mg/dL
Leukocytes,Ua: NEGATIVE
Nitrite: NEGATIVE
Protein, ur: 30 mg/dL — AB
Specific Gravity, Urine: 1.02 (ref 1.005–1.030)
Squamous Epithelial / HPF: 0 /HPF (ref 0–5)
pH: 5 (ref 5.0–8.0)

## 2023-08-24 LAB — GLUCOSE, CAPILLARY
Glucose-Capillary: 121 mg/dL — ABNORMAL HIGH (ref 70–99)
Glucose-Capillary: 134 mg/dL — ABNORMAL HIGH (ref 70–99)
Glucose-Capillary: 139 mg/dL — ABNORMAL HIGH (ref 70–99)
Glucose-Capillary: 143 mg/dL — ABNORMAL HIGH (ref 70–99)
Glucose-Capillary: 147 mg/dL — ABNORMAL HIGH (ref 70–99)

## 2023-08-24 MED ORDER — NYSTATIN 100000 UNIT/GM EX POWD
Freq: Three times a day (TID) | CUTANEOUS | Status: DC
  Filled 2023-08-24: qty 15

## 2023-08-24 MED ORDER — KCL IN DEXTROSE-NACL 20-5-0.9 MEQ/L-%-% IV SOLN
INTRAVENOUS | Status: DC
  Filled 2023-08-24 (×3): qty 1000

## 2023-08-24 NOTE — Progress Notes (Signed)
 Progress Note   Patient: Jeff Key:[id number] DOB: 1945-08-29 DOA: 08/17/2023     6 DOS: the patient was seen and examined on 08/24/2023   Brief hospital course: 78 y.o. 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   Patient was found to have left lower lobe pneumonia and was admitted for further evaluation and management including IV antibiotics.  6/4.  Patient completed 5 days of Rocephin  and Zithromax  for pneumonia and sepsis.  Still with altered mental status.  Patient was able to open up his eyes and squeeze my hands. 6/5.  Patient did not do well with swallow evaluation today.  Continue to monitor.  Assessment and Plan: * Severe sepsis (HCC) Present on admission with left lower lobe pneumonia, leukocytosis tachycardia and tachypnea.  Patient also had acute metabolic encephalopathy.  Completed 5 days of Rocephin  and Zithromax .  Lobar pneumonia (HCC) Completed 5 days of Rocephin  and Zithromax .  Acute metabolic encephalopathy Patient has underlying dementia.  Mental status has not improved back to his baseline.  Patient did not do well with swallow evaluation yesterday.  Speech to continue to evaluate.  Would be a candidate for hospice.  Family still hopeful that he will start eating.  Inadequate oral intake Did not do well with speech pathology yesterday.  Type 2 diabetes mellitus (HCC) Sugars controlled but patient NPO.  Will add D5 to IV fluids.  Constipation Dulcolax suppository  Hypokalemia Replace potassium in IV fluids  Pressure injury Right anterior foot stage I.  See full description below.  Present on admission.  Acute cholecystitis Patient has  cholecystectomy drain.  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  Able to squeeze my hands and wiggle his toes.  Notified this afternoon that his urine was a little darker will send off for urine analysis.  Admitted 7 days ago with sepsis and pneumonia.  Physical Exam: Vitals:   08/23/23 0723 08/23/23 1536 08/23/23 1944 08/24/23 0821  BP: (!) 162/80 (!) 148/68 120/74 (!) 147/84  Pulse: 74 73 69 65  Resp: 16 20 16 15   Temp: (!) 96.5 F (35.8 C) 98.2 F (36.8 C) 97.9 F (36.6 C) 98.4 F (36.9 C)  TempSrc:   Oral Oral  SpO2: 95% 98% 93% 95%  Weight:      Height:       Physical Exam HENT:     Head: Normocephalic.     Mouth/Throat:     Pharynx: No oropharyngeal exudate.  Eyes:     General: Lids are normal.     Conjunctiva/sclera: Conjunctivae normal.  Cardiovascular:     Rate and Rhythm: Normal rate and regular rhythm.     Heart sounds: Normal heart sounds, S1 normal and S2 normal.  Pulmonary:     Breath sounds: No decreased breath sounds, wheezing, rhonchi or rales.  Abdominal:     Palpations: Abdomen is soft.     Tenderness: There is no abdominal tenderness.  Musculoskeletal:     Right lower leg: No swelling.     Left lower leg: No swelling.  Skin:    General: Skin is warm.  Findings: No rash.  Neurological:     Mental Status: He is lethargic.     Comments: Patient had his eyes open while he was in the room.  Able to squeeze my hands and wiggle toes.  Unable to speak.     Data Reviewed: Potassium 3.4, creatinine 0.7  Family Communication: Spoke with wife at bedside  Disposition: Status is: Inpatient Remains inpatient appropriate because: Still has not passed swallow evaluation.  Not a candidate for PEG with dementia.  Wife still hopeful that be able to eat.  Will be a candidate for hospice.   Likely will pass away pretty quickly if I got rid of IV fluids.  Planned Discharge Destination: Potentially home with hospice    Time spent: 28 minutes  Author: Verla Glaze, MD 08/24/2023 12:50 PM  For on call review [email or web address].

## 2023-08-24 NOTE — Progress Notes (Signed)
 Urine appears dark amber in color. MD notified at this time.

## 2023-08-24 NOTE — TOC Progression Note (Addendum)
 Transition of Care [facility]) - Progression Note    Patient Details  Name: Jeff Key MRN: [id number] Date of Birth: [birth year]  [facility] CM/SW Contact  Crayton Docker, RN 08/24/2023, 11:10 AM  Clinical Narrative:     CM discussed with Dr. Clelia Current regarding discharge care planning. Hospital day 7 with diagnosis of Severe sepsis. Per Allana Ishikawa, patient's wife, has declined hospice services. Per Dr. Clelia Current, awaiting swallow study. Recommendations from bedside swallow evaluation on 08/23/2023, patient to remain NPO except ice chips.   Possible pending BSE.  Patient will require EMS transport to return home.   Expected Discharge Plan: Skilled Nursing Facility Barriers to Discharge: Continued Medical Work up  Expected Discharge Plan and Services    TBD   Post Acute Care Choice: Home Health Living arrangements for the past 2 months: Single Family Home                    HH Arranged: RN HH Agency: [facility]   Social Determinants of Health (SDOH) Interventions SDOH Screenings   Food Insecurity: Patient Unable To Answer (08/17/2023)  Housing: Patient Unable To Answer (08/17/2023)  Transportation Needs: Patient Unable To Answer (08/17/2023)  Utilities: Patient Unable To Answer (08/17/2023)  Financial Resource Strain: Low Risk  (07/13/2022)   Received from [facility], [facility]  Social Connections: Unknown (08/17/2023)  Tobacco Use: Medium Risk (08/17/2023)    Readmission Risk Interventions     No data to display

## 2023-08-24 NOTE — Progress Notes (Signed)
 Speech Language Pathology Treatment: Dysphagia  Patient Details Name: Jeff Key MRN: [id number] DOB: 1945-05-01 Today's Date: 08/24/2023 Time: 2130-8657 SLP Time Calculation (min) (ACUTE ONLY): 15 min  Assessment / Plan / Recommendation Clinical Impression  Pt seen for ongoing PO readiness and possible diet advancement/return to PO intake. Pt in bed and intermittently opened in his but to in direct response to verbal or tactile stimuli (sternal rub). This writer also placed spoon at pt's mouth but pt was not able to demonstrate response by opening his mouth. Pt remains at a very high risk of aspiration d/t cognitive deficits. Pt's wife was not present during this session by pt's attending (Dr Clelia Current) and pt's nurse made aware of these findings. While pt's wife remains hopeful for return to PO intake, pt will need to demonstrate skillable ability to remain on caseload. MD states that he is ordering a Head CT and ST services will schedule pt on Monday to allow for potential improvement and PO readiness.    HPI HPI: Per H&P: Jeff Key is a 78 y.o. 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     At baseline, patient is nonverbal, due to advanced dementia, he needs to be fed for his meals.  Family member including wife and daughter at bedside reported patient has no history of aspiration before and does not choke or cough after eat or drink.  Yesterday evening after dinner however patient started to be sleepy and breathing heavily, gurgly in her throat but has no cough.  This morning, family again found the patient is sleepy and hard of loss and called EMS.  Patient has a recent cholecystitis status post cholecystostomy insertion on recent  hospitalization and wife reported no significant change of output or color of the cholecystotomy recently.  No diarrhea.  But patient has been constipated for 3 to 4 days and wife has tried p.o. Colace as well as OTC enema yesterday without significant issue.  No vomiting as per family . CXR 5/30: Low lung volumes. Retrocardiac airspace opacities, may represent  atelectasis or a developing bronchopneumonia, in the correct  clinical context.      SLP Plan  Continue with current plan of care          Recommendations  Diet recommendations: NPO Medication Administration: Via alternative means Compensations: Minimize environmental distractions;Slow rate;Small sips/bites Postural Changes and/or Swallow Maneuvers: Seated upright 90 degrees                  Oral care prior to ice chip/H20;Oral care QID;Staff/trained caregiver to provide oral care     Dysphagia, unspecified (R13.10)     Continue with current plan of care    Dara Camargo B. Garlin Junker, M.S., CCC-SLP, Tree surgeon Certified Brain Injury Specialist [facility]  [facility] Rehabilitation Services Office [phone] Ascom [phone] Fax [phone]

## 2023-08-24 NOTE — Plan of Care (Signed)
  Problem: Skin Integrity: Goal: Risk for impaired skin integrity will decrease Outcome: Progressing   Problem: Elimination: Goal: Will not experience complications related to urinary retention Outcome: Progressing

## 2023-08-25 DIAGNOSIS — R638 Other symptoms and signs concerning food and fluid intake: Secondary | ICD-10-CM | POA: Diagnosis not present

## 2023-08-25 DIAGNOSIS — J181 Lobar pneumonia, unspecified organism: Secondary | ICD-10-CM | POA: Diagnosis not present

## 2023-08-25 DIAGNOSIS — A419 Sepsis, unspecified organism: Secondary | ICD-10-CM | POA: Diagnosis not present

## 2023-08-25 DIAGNOSIS — G9341 Metabolic encephalopathy: Secondary | ICD-10-CM | POA: Diagnosis not present

## 2023-08-25 DIAGNOSIS — T801XXA Vascular complications following infusion, transfusion and therapeutic injection, initial encounter: Secondary | ICD-10-CM | POA: Insufficient documentation

## 2023-08-25 LAB — COMPREHENSIVE METABOLIC PANEL WITH GFR
ALT: 42 U/L (ref 0–44)
AST: 45 U/L — ABNORMAL HIGH (ref 15–41)
Albumin: 2.6 g/dL — ABNORMAL LOW (ref 3.5–5.0)
Alkaline Phosphatase: 146 U/L — ABNORMAL HIGH (ref 38–126)
Anion gap: 6 (ref 5–15)
BUN: 10 mg/dL (ref 8–23)
CO2: 27 mmol/L (ref 22–32)
Calcium: 8.7 mg/dL — ABNORMAL LOW (ref 8.9–10.3)
Chloride: 110 mmol/L (ref 98–111)
Creatinine, Ser: 0.74 mg/dL (ref 0.61–1.24)
GFR, Estimated: >60 mL/min (ref >60)
Glucose, Bld: 139 mg/dL — ABNORMAL HIGH (ref 70–99)
Potassium: 3.4 mmol/L — ABNORMAL LOW (ref 3.5–5.1)
Sodium: 143 mmol/L (ref 135–145)
Total Bilirubin: 0.8 mg/dL (ref 0.0–1.2)
Total Protein: 7 g/dL (ref 6.5–8.1)

## 2023-08-25 LAB — CBC
HCT: 35.3 % — ABNORMAL LOW (ref 39.0–52.0)
Hemoglobin: 11.1 g/dL — ABNORMAL LOW (ref 13.0–17.0)
MCH: 28.2 pg (ref 26.0–34.0)
MCHC: 31.4 g/dL (ref 30.0–36.0)
MCV: 89.6 fL (ref 80.0–100.0)
Platelets: 379 10*3/uL (ref 150–400)
RBC: 3.94 MIL/uL — ABNORMAL LOW (ref 4.22–5.81)
RDW: 13.2 % (ref 11.5–15.5)
WBC: 9.7 10*3/uL (ref 4.0–10.5)
nRBC: 0 % (ref 0.0–0.2)

## 2023-08-25 LAB — GLUCOSE, CAPILLARY
Glucose-Capillary: 125 mg/dL — ABNORMAL HIGH (ref 70–99)
Glucose-Capillary: 125 mg/dL — ABNORMAL HIGH (ref 70–99)
Glucose-Capillary: 128 mg/dL — ABNORMAL HIGH (ref 70–99)
Glucose-Capillary: 130 mg/dL — ABNORMAL HIGH (ref 70–99)
Glucose-Capillary: 135 mg/dL — ABNORMAL HIGH (ref 70–99)
Glucose-Capillary: 154 mg/dL — ABNORMAL HIGH (ref 70–99)

## 2023-08-25 MED ORDER — KCL IN DEXTROSE-NACL 20-5-0.9 MEQ/L-%-% IV SOLN
INTRAVENOUS | Status: AC
  Filled 2023-08-25 (×3): qty 1000

## 2023-08-25 MED ORDER — TAMSULOSIN HCL 0.4 MG PO CAPS
0.4000 mg | ORAL_CAPSULE | Freq: Every day | ORAL | Status: DC
  Administered 2023-08-26: 0.4 mg via ORAL
  Filled 2023-08-25: qty 1

## 2023-08-25 MED ORDER — CLOPIDOGREL BISULFATE 75 MG PO TABS
75.0000 mg | ORAL_TABLET | Freq: Every day | ORAL | Status: DC
  Administered 2023-08-26 – 2023-08-27 (×2): 75 mg via ORAL
  Filled 2023-08-25 (×2): qty 1

## 2023-08-25 MED ORDER — ESCITALOPRAM OXALATE 10 MG PO TABS
5.0000 mg | ORAL_TABLET | Freq: Every day | ORAL | Status: DC
  Administered 2023-08-26 – 2023-08-27 (×2): 5 mg via ORAL
  Filled 2023-08-25 (×2): qty 1

## 2023-08-25 MED ORDER — CEFAZOLIN SODIUM-DEXTROSE 2-4 GM/100ML-% IV SOLN
2.0000 g | Freq: Three times a day (TID) | INTRAVENOUS | Status: DC
  Administered 2023-08-25 – 2023-08-27 (×6): 2 g via INTRAVENOUS
  Filled 2023-08-25 (×6): qty 100

## 2023-08-25 MED ORDER — DOCUSATE SODIUM 100 MG PO CAPS
100.0000 mg | ORAL_CAPSULE | Freq: Two times a day (BID) | ORAL | Status: DC
  Administered 2023-08-26 – 2023-08-27 (×3): 100 mg via ORAL
  Filled 2023-08-25 (×3): qty 1

## 2023-08-25 NOTE — Plan of Care (Signed)
   Problem: Coping: Goal: Ability to adjust to condition or change in health will improve Outcome: Progressing   Problem: Nutritional: Goal: Maintenance of adequate nutrition will improve Outcome: Progressing   Problem: Skin Integrity: Goal: Risk for impaired skin integrity will decrease Outcome: Progressing

## 2023-08-25 NOTE — Progress Notes (Signed)
 Left IV infiltrated. Below site is red. MD made aware. IV removed. Will continue to monitor for increasing redness/swelling.

## 2023-08-25 NOTE — Progress Notes (Signed)
 Speech Language Pathology Treatment: Dysphagia  Patient Details Name: Jeff Key MRN: [id number] DOB: 19-Nov-1945 Today's Date: 08/25/2023 Time: 8295-6213 SLP Time Calculation (min) (ACUTE ONLY): 20 min  Assessment / Plan / Recommendation Clinical Impression  Pt seen for follow up dysphagia intervention in the setting of MD request/increased pt alertness. Eyes opened, intermittently responding to MD commands with tactile cues upon therapist approach. Spouse indicating continual use of ice when pt is alert. Pt following commands for completion of PO trials with aid of verbal/tactile/visual cues to aid oral acceptance and lip closure on straw. Min increased time for oral clearance. No overt or subtle s/sx pharyngeal dysphagia noted. No change to vocal quality across trials. Vitals stable for duration of trials (93-96).   Given current alertness and engagement in PO trials, pt is appropriate for initiation of puree and thin liquid diet. Education shared with spouse regarding more "small snack" mentality when pt is alert and upright/ready to eat, given low endurance for completion of entire meal.   Aspiration risk remains in the setting of baseline cognition, dependency with intake, inconsistent alertness, and acute debility. Aspiration precaution education (slow rate, small sips, elevated HOB, alert for PO intake, monitor for oral clearance, etc) provided and recommended. Medications crushed in puree- pt with hx of chewing medication at home. MD and RN aware of recommendations. SLP will continue to follow.    HPI HPI: Per H&P: Jeff Key is a 78 y.o. 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     At baseline, patient is nonverbal, due to  advanced dementia, he needs to be fed for his meals.  Family member including wife and daughter at bedside reported patient has no history of aspiration before and does not choke or cough after eat or drink.  Yesterday evening after dinner however patient started to be sleepy and breathing heavily, gurgly in her throat but has no cough.  This morning, family again found the patient is sleepy and hard of loss and called EMS.  Patient has a recent cholecystitis status post cholecystostomy insertion on recent hospitalization and wife reported no significant change of output or color of the cholecystotomy recently.  No diarrhea.  But patient has been constipated for 3 to 4 days and wife has tried p.o. Colace as well as OTC enema yesterday without significant issue.  No vomiting as per family . CXR 5/30: Low lung volumes. Retrocardiac airspace opacities, may represent  atelectasis or a developing bronchopneumonia, in the correct  clinical context.      SLP Plan  Continue with current plan of care          Recommendations  Diet recommendations: Dysphagia 1 (puree);Thin liquid Liquids provided via: Straw Medication Administration: Crushed with puree Supervision: Full supervision/cueing for compensatory strategies Compensations: Minimize environmental distractions;Slow rate;Small sips/bites Postural Changes and/or Swallow Maneuvers: Seated upright 90 degrees                  Oral care before and after PO;Staff/trained caregiver to provide oral care   Frequent or constant Supervision/Assistance Dysphagia, unspecified (R13.10)     Continue with current plan of care    [place] Catherina Pates Clapp, MS, CCC-SLP Speech Language Pathologist Rehab Services; [facility] [phone] (ascom)   [place] J Clapp  08/25/2023, 9:44 AM

## 2023-08-25 NOTE — Progress Notes (Signed)
 Progress Note   Patient: Jeff Key ZOX:[id number] DOB: January 30, 1946 DOA: 08/17/2023     7 DOS: the patient was seen and examined on 08/25/2023   Brief hospital course: 78 y.o. 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   Patient was found to have left lower lobe pneumonia and was admitted for further evaluation and management including IV antibiotics.  6/4.  Patient completed 5 days of Rocephin  and Zithromax  for pneumonia and sepsis.  Still with altered mental status.  Patient was able to open up his eyes and squeeze my hands. 6/5.  Patient did not do well with swallow evaluation today.  Continue to monitor. 6/7.  Patient seen in conjunction with speech pathology and patient placed on dysphagia 1 diet with thin liquids.  Will see how he does.  High risk for aspiration and also may not keep up with nutritional needs.  Wife amenable to hospice following at home.  IV infiltration with some erythema will start Ancef.  Assessment and Plan: * Acute metabolic encephalopathy Patient has underlying dementia.  Seems more alert today than previous.  Able to be put on a dysphagia 1 diet with thin liquids as per speech pathology.  Still high risk of aspiration and not keeping up with nutritional needs.  Wife amenable to hospice.  Severe sepsis (HCC) Present on admission with left lower lobe pneumonia, leukocytosis tachycardia and tachypnea.  Patient also had acute metabolic encephalopathy.  Completed 5 days of Rocephin  and Zithromax .  Lobar pneumonia (HCC) Completed 5 days of Rocephin  and Zithromax .  Inadequate oral intake Trial of a diet today with dysphagia 1 diet with thin liquids.  IV  infiltration, initial encounter Slight redness around IV infiltration left forearm.  Will empirically start Ancef.  Type 2 diabetes mellitus (HCC) See how he does with eating  Constipation Dulcolax suppository  Hypokalemia Replace potassium in IV fluids  Pressure injury Right anterior foot stage I.  See full description below.  Present on admission.  Acute cholecystitis Patient has cholecystectomy drain.  Alzheimer's dementia without behavioral disturbance (HCC) Trial of diet today dysphagia 1 diet with thin liquids        Subjective: Patient able to wiggle his toes and squeeze my hands.  Has his eyes open more today.  Admitted with sepsis and pneumonia  Physical Exam: Vitals:   08/24/23 1925 08/25/23 0408 08/25/23 0741 08/25/23 1319  BP: (!) 147/89 (!) 140/100 103/75 133/82  Pulse: 72 80 75 93  Resp: 14 17 18 18   Temp: (!) 97.5 F (36.4 C) 97.8 F (36.6 C) 98.3 F (36.8 C) 98.2 F (36.8 C)  TempSrc: Axillary  Axillary   SpO2: 99% 99% 94% 97%  Weight:      Height:       Physical Exam HENT:     Head: Normocephalic.     Mouth/Throat:     Pharynx: No oropharyngeal exudate.  Eyes:     General: Lids are normal.     Conjunctiva/sclera: Conjunctivae normal.  Cardiovascular:     Rate and Rhythm: Normal rate and regular rhythm.     Heart sounds: Normal heart sounds, S1 normal and S2 normal.  Pulmonary:     Breath sounds: Examination of the right-lower field reveals decreased breath sounds. Examination of the left-lower  field reveals decreased breath sounds. Decreased breath sounds present. No wheezing, rhonchi or rales.  Abdominal:     Palpations: Abdomen is soft.     Tenderness: There is no abdominal tenderness.  Musculoskeletal:     Right lower leg: No swelling.     Left lower leg: No swelling.  Skin:    General: Skin is warm.     Findings: No rash.  Neurological:     Mental Status: He is alert.     Comments: Patient with his eyes open and turning his  head.  Able to move his arms and wiggle his toes.     Data Reviewed: Creatinine 0.74, potassium 3.4, albumin 2.6, AST 45, white blood cell count 9.7, hemoglobin 11.1, platelet count 379  Family Communication: Spoke with wife at the bedside  Disposition: Status is: Inpatient Remains inpatient appropriate because: Patient just placed on a dysphagia 1 diet today we will see how he does.  Wife amenable to setting up hospice at home.  Planned Discharge Destination: Home with hospice    Time spent: 28 minutes Case discussed with nursing staff and speech pathology  Author: Verla Glaze, MD 08/25/2023 2:16 PM  For on call review [email or web address].

## 2023-08-25 NOTE — Assessment & Plan Note (Signed)
 Slight redness around IV infiltration left forearm.  Will empirically start Ancef.

## 2023-08-26 DIAGNOSIS — R638 Other symptoms and signs concerning food and fluid intake: Secondary | ICD-10-CM | POA: Diagnosis not present

## 2023-08-26 DIAGNOSIS — A419 Sepsis, unspecified organism: Secondary | ICD-10-CM | POA: Diagnosis not present

## 2023-08-26 DIAGNOSIS — T801XXS Vascular complications following infusion, transfusion and therapeutic injection, sequela: Secondary | ICD-10-CM

## 2023-08-26 DIAGNOSIS — G9341 Metabolic encephalopathy: Secondary | ICD-10-CM | POA: Diagnosis not present

## 2023-08-26 DIAGNOSIS — J181 Lobar pneumonia, unspecified organism: Secondary | ICD-10-CM | POA: Diagnosis not present

## 2023-08-26 LAB — GLUCOSE, CAPILLARY
Glucose-Capillary: 126 mg/dL — ABNORMAL HIGH (ref 70–99)
Glucose-Capillary: 133 mg/dL — ABNORMAL HIGH (ref 70–99)
Glucose-Capillary: 134 mg/dL — ABNORMAL HIGH (ref 70–99)
Glucose-Capillary: 127 mg/dL — ABNORMAL HIGH (ref 70–99)
Glucose-Capillary: 147 mg/dL — ABNORMAL HIGH (ref 70–99)

## 2023-08-26 NOTE — Significant Event (Signed)
       CROSS COVER NOTE  NAME: Jeff Key MRN: [id number] DOB : 06-09-1945 ATTENDING PHYSICIAN: Verla Glaze, MD    Date of Service   08/26/2023   HPI/Events of Note   Message received from RN Patient admitted on 08/17/23 for LLL PNA and sepsis. Since 5/31, he has had a foley cath due to urinary retention. Tonight (2100), I noticed urine leakage around the penile insertion. After catheter care, I deflated balloon with 10ml and advanced the catheter. Now, he still has urinary leakage. FYI his Flomax  was resumed yesterday but not given. Also, he has been known to tug catheter. No blood is currently in drainage bag. I am reaching out due to no orders for cath d/c and how to proceed. Please assist. Thanks!   Interventions   Assessment/Plan: Discontinue foley Bladder scan every 8 h       Kip Peon NP [facility] Cross Cover 7pm-7am - check amion for availability Pager [phone]

## 2023-08-26 NOTE — Progress Notes (Signed)
 Progress Note   Patient: Jeff Key [id number]:[id number] DOB: [birth year] DOA: 08/17/2023     8 DOS: the patient was seen and examined on 08/26/2023   Brief hospital course: 78 y.o. 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   Patient was found to have left lower lobe pneumonia and was admitted for further evaluation and management including IV antibiotics.  6/4.  Patient completed 5 days of Rocephin  and Zithromax  for pneumonia and sepsis.  Still with altered mental status.  Patient was able to open up his eyes and squeeze my hands. 6/5.  Patient did not do well with swallow evaluation today.  Continue to monitor. 6/7.  Patient seen in conjunction with speech pathology and patient placed on dysphagia 1 diet with thin liquids.  Will see how he does.  High risk for aspiration and also may not keep up with nutritional needs.  Wife amenable to hospice following at home.  IV infiltration with some erythema will start Ancef. 6/8.  Since wife said he did okay with the diet yesterday we will stop fluids today and see how he does with the diet today.  Assessment and Plan: * Acute metabolic encephalopathy Patient has underlying dementia.  Yesterday he was placed on a dysphagia 1 diet with thin liquids as per speech pathology.  Still high risk of aspiration and not keeping up with nutritional needs.  Wife amenable to hospice.  Will discontinue fluids today and see how he does with the diet.  Severe sepsis (HCC) Present on admission with left lower lobe pneumonia, leukocytosis tachycardia and tachypnea.  Patient also had acute metabolic encephalopathy.  Completed 5 days of Rocephin  and Zithromax .  Lobar  pneumonia (HCC) Completed 5 days of Rocephin  and Zithromax .  Inadequate oral intake Trial of a diet today with dysphagia 1 diet with thin liquids.  Discontinue fluids.  Intravenous infiltration Slight redness around IV infiltration left forearm.  Empiric Ancef for now.  Type 2 diabetes mellitus (HCC) See how he does with eating  Constipation Dulcolax suppository as needed  Hypokalemia Replaced  Pressure injury Right anterior foot stage I.  See full description below.  Present on admission.  Acute cholecystitis Patient has cholecystectomy drain.  Alzheimer's dementia without behavioral disturbance (HCC) Trial of diet today dysphagia 1 diet with thin liquids        Subjective: Will see how he does with the diet today.  Patient admitted 9 days ago with sepsis and pneumonia.  Placed on a diet yesterday.  Patient's wife said he did pretty good.  Will stop IV fluids today and reassess tomorrow.  Physical Exam: Vitals:   08/25/23 1319 08/25/23 1951 08/26/23 0204 08/26/23 0807  BP: 133/82 (!) 156/100 131/79 (!) 97/59  Pulse: 93 97 84 69  Resp: 18 19 16 17   Temp: 98.2 F (36.8 C) 98.3 F (36.8 C) (!) 97.5 F (36.4 C) 98.5 F (36.9 C)  TempSrc:  Oral    SpO2: 97% 94% 95% 98%  Weight:      Height:       Physical Exam HENT:     Head: Normocephalic.     Mouth/Throat:     Pharynx: No oropharyngeal exudate.  Eyes:     General: Lids are normal.     Conjunctiva/sclera: Conjunctivae  normal.  Cardiovascular:     Rate and Rhythm: Normal rate and regular rhythm.     Heart sounds: Normal heart sounds, S1 normal and S2 normal.  Pulmonary:     Breath sounds: Examination of the right-lower field reveals decreased breath sounds. Examination of the left-lower field reveals decreased breath sounds. Decreased breath sounds present. No wheezing, rhonchi or rales.  Abdominal:     Palpations: Abdomen is soft.     Tenderness: There is no abdominal tenderness.  Musculoskeletal:      Right lower leg: No swelling.     Left lower leg: No swelling.  Skin:    General: Skin is warm.     Findings: No rash.  Neurological:     Mental Status: He is lethargic.     Comments: Patient had his eyes closed today while I was in the room.     Data Reviewed: No new data today  Family Communication: Spoke with wife at the bedside  Disposition: Status is: Inpatient Remains inpatient appropriate because: Will stop fluids today and see how he does with the diet.  High risk for aspiration and high risk for not being able to keep up with his nutritional needs.  Planned Discharge Destination: Home with hospice following    Time spent: 28 minutes  Author: Verla Glaze, MD 08/26/2023 12:58 PM  For on call review [email or web address].

## 2023-08-27 DIAGNOSIS — R638 Other symptoms and signs concerning food and fluid intake: Secondary | ICD-10-CM | POA: Diagnosis not present

## 2023-08-27 DIAGNOSIS — B369 Superficial mycosis, unspecified: Secondary | ICD-10-CM | POA: Insufficient documentation

## 2023-08-27 DIAGNOSIS — J181 Lobar pneumonia, unspecified organism: Secondary | ICD-10-CM | POA: Diagnosis not present

## 2023-08-27 DIAGNOSIS — A419 Sepsis, unspecified organism: Secondary | ICD-10-CM | POA: Diagnosis not present

## 2023-08-27 DIAGNOSIS — G9341 Metabolic encephalopathy: Secondary | ICD-10-CM | POA: Diagnosis not present

## 2023-08-27 LAB — CBC
HCT: 35.1 % — ABNORMAL LOW (ref 39.0–52.0)
Hemoglobin: 10.7 g/dL — ABNORMAL LOW (ref 13.0–17.0)
MCH: 28.1 pg (ref 26.0–34.0)
MCHC: 30.5 g/dL (ref 30.0–36.0)
MCV: 92.1 fL (ref 80.0–100.0)
Platelets: 327 10*3/uL (ref 150–400)
RBC: 3.81 MIL/uL — ABNORMAL LOW (ref 4.22–5.81)
RDW: 13.2 % (ref 11.5–15.5)
WBC: 10.5 10*3/uL (ref 4.0–10.5)
nRBC: 0 % (ref 0.0–0.2)

## 2023-08-27 LAB — BASIC METABOLIC PANEL WITH GFR
Anion gap: 9 (ref 5–15)
BUN: 14 mg/dL (ref 8–23)
CO2: 24 mmol/L (ref 22–32)
Calcium: 8.4 mg/dL — ABNORMAL LOW (ref 8.9–10.3)
Chloride: 110 mmol/L (ref 98–111)
Creatinine, Ser: 0.79 mg/dL (ref 0.61–1.24)
GFR, Estimated: >60 mL/min (ref >60)
Glucose, Bld: 120 mg/dL — ABNORMAL HIGH (ref 70–99)
Potassium: 3.7 mmol/L (ref 3.5–5.1)
Sodium: 143 mmol/L (ref 135–145)

## 2023-08-27 LAB — GLUCOSE, CAPILLARY
Glucose-Capillary: 110 mg/dL — ABNORMAL HIGH (ref 70–99)
Glucose-Capillary: 127 mg/dL — ABNORMAL HIGH (ref 70–99)
Glucose-Capillary: 144 mg/dL — ABNORMAL HIGH (ref 70–99)
Glucose-Capillary: 175 mg/dL — ABNORMAL HIGH (ref 70–99)

## 2023-08-27 LAB — MAGNESIUM: Magnesium: 2.1 mg/dL (ref 1.7–2.4)

## 2023-08-27 MED ORDER — NYSTATIN 100000 UNIT/GM EX POWD: Freq: Three times a day (TID) | CUTANEOUS | 0 refills | Status: DC

## 2023-08-27 MED ORDER — DSS 100 MG PO CAPS: 100.0000 mg | ORAL_CAPSULE | Freq: Two times a day (BID) | ORAL | 0 refills | Status: AC

## 2023-08-27 MED ORDER — CEPHALEXIN 500 MG PO CAPS: 500.0000 mg | ORAL_CAPSULE | Freq: Three times a day (TID) | ORAL | 0 refills | Status: AC

## 2023-08-27 NOTE — Discharge Summary (Signed)
 Physician Discharge Summary   Patient: Jeff Key MRN: [id number] DOB: February 17, 1946  Admit date:     08/17/2023  Discharge date: 08/27/23  Discharge Physician: Verla Glaze   PCP: Antonio Baumgarten, MD   Recommendations at discharge:   Follow-up PCP 5 days Follow-up biliary drain clinic Refer to hospice for home.  Discharge Diagnoses: Principal Problem:   Acute metabolic encephalopathy Active Problems:   Severe sepsis (HCC)   Lobar pneumonia (HCC)   Inadequate oral intake   CAD (coronary artery disease)   Ischemic cardiomyopathy   Alzheimer's dementia without behavioral disturbance (HCC)   Hyperlipidemia   hx of Ulcerative colitis (HCC)   Acute cholecystitis   Pressure injury   Hypokalemia   Constipation   Type 2 diabetes mellitus (HCC)   Intravenous infiltration   Fungal infection of skin    Hospital Course: 78 y.o. 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   Patient was found to have left lower lobe pneumonia and was admitted for further evaluation and management including IV antibiotics.  6/4.  Patient completed 5 days of Rocephin  and Zithromax  for pneumonia and sepsis.  Still with altered mental status.  Patient was able to open up his eyes and squeeze my hands. 6/5.  Patient did not do well with swallow evaluation today.  Continue to monitor. 6/7.  Patient seen in conjunction with speech pathology and patient placed on dysphagia 1 diet with thin liquids.  Will see how he does.  High risk for aspiration and also may not keep up with nutritional needs.  Wife amenable to hospice following at home.  IV infiltration with some erythema will start Ancef. 6/8.  Since  wife said he did okay with the diet yesterday we will stop fluids today and see how he does with the diet today. 6/9.  Patient's wife states that he ate okay yesterday and willing to go home today with hospice.  Assessment and Plan: * Acute metabolic encephalopathy Patient has underlying dementia.  2 days ago he was placed on a dysphagia 1 diet with thin liquids as per speech pathology.  Still high risk of aspiration and not keeping up with nutritional needs.  Wife amenable to hospice at home.  Patient is a DNR.  Severe sepsis (HCC) Present on admission with left lower lobe pneumonia, leukocytosis tachycardia and tachypnea.  Patient also had acute metabolic encephalopathy.  Completed 5 days of Rocephin  and Zithromax .  Lobar pneumonia (HCC) Completed 5 days of Rocephin  and Zithromax .  Inadequate oral intake Continue dysphagia 1 diet with thin liquids.  High risk for not keeping up with nutritional needs.  Fungal infection of skin Nystatin  powder if insurance company will cover otherwise we will have to buy over-the-counter antifungal cream.  Intravenous infiltration Slight redness around IV infiltration left forearm improved with IV Ancef.  Will give a few days of Keflex  upon going home.  Type 2 diabetes mellitus (HCC) Would hold Glucophage and just do diet control.  Constipation Patient given Duphalac  suppositories while here.  Can go back on Linzess.  Hypokalemia Replaced  Pressure injury Right anterior foot stage I.  See full description below.  Present on admission.  Acute cholecystitis Patient has cholecystectomy drain.  Patient's wife states that she has an appointment for the drain  clinic and she knows how to flush the drain.  hx of Ulcerative colitis (HCC) Can go back on Colazal   Alzheimer's dementia without behavioral disturbance (HCC) Trial of diet today dysphagia 1 diet with thin liquids         Consultants: Palliative care, speech pathology Procedures  performed: None Disposition: Home with hospice Diet recommendation:  Pured diet with thin liquids DISCHARGE MEDICATION: Allergies as of 08/27/2023   No Known Allergies      Medication List     STOP taking these medications    ferrous sulfate  325 (65 FE) MG tablet   lisinopril  10 MG tablet Commonly known as: ZESTRIL    metFORMIN 500 MG tablet Commonly known as: GLUCOPHAGE       TAKE these medications    aspirin  EC 81 MG tablet Take 81 mg by mouth daily.   atorvastatin  20 MG tablet Commonly known as: LIPITOR  Take 1 tablet (20 mg total) by mouth daily.   balsalazide 750 MG capsule Commonly known as: COLAZAL  Take 2,250 mg by mouth in the morning and at bedtime.   cephALEXin  500 MG capsule Commonly known as: KEFLEX  Take 1 capsule (500 mg total) by mouth 3 (three) times daily for 4 days.   clopidogrel  75 MG tablet Commonly known as: PLAVIX  Take 1 tablet (75 mg total) by mouth daily.   cyanocobalamin  1000 MCG tablet Commonly known as: VITAMIN B12 Take 1,000 mcg by mouth daily.   DSS 100 MG Caps Take 1 capsule (100 mg total) by mouth in the morning and at bedtime.   escitalopram  5 MG tablet Commonly known as: LEXAPRO  Take 5 mg by mouth daily.   galantamine  12 MG tablet Commonly known as: RAZADYNE  Take 12 mg by mouth 2 (two) times daily with a meal.   linaclotide 72 MCG capsule Commonly known as: LINZESS Take 72 mcg by mouth daily before breakfast.   memantine  10 MG tablet Commonly known as: NAMENDA  Take 10 mg by mouth 2 (two) times daily.   nitroGLYCERIN  0.4 MG SL tablet Commonly known as: Nitrostat  Place 1 tablet (0.4 mg total) under the tongue every 5 (five) minutes as needed for chest pain.   nystatin  powder Commonly known as: MYCOSTATIN /NYSTOP  Apply topically 3 (three) times daily.   sodium chloride  flush 0.9 % Soln Commonly known as: NS 5 mLs by Intracatheter route every 8 (eight) hours. What changed: Another medication with the same name  was removed. Continue taking this medication, and follow the directions you see here.   tamsulosin  0.4 MG Caps capsule Commonly known as: FLOMAX  Take 1 capsule (0.4 mg total) by mouth daily.        Follow-up Information     [facility] Follow up.   Specialty: Home Health Services Why: 06/04---per Randel Buss, agency is following for home health RN services. Contact information: [address] [place] [address] [phone]         Antonio Baumgarten, MD Follow up in 5 day(s).   Specialty: Internal Medicine Contact information: [address] [place] [address] [phone]                Discharge Exam: Cleavon Curls Weights   08/17/23 1024  Weight: 100.7 kg   Physical Exam HENT:     Head: Normocephalic.     Mouth/Throat:     Pharynx: No oropharyngeal exudate.  Eyes:     General: Lids are normal.     Conjunctiva/sclera: Conjunctivae normal.  Cardiovascular:     Rate  and Rhythm: Normal rate and regular rhythm.     Heart sounds: Normal heart sounds, S1 normal and S2 normal.  Pulmonary:     Breath sounds: Examination of the right-lower field reveals decreased breath sounds. Examination of the left-lower field reveals decreased breath sounds. Decreased breath sounds present. No wheezing, rhonchi or rales.  Abdominal:     Palpations: Abdomen is soft.     Tenderness: There is no abdominal tenderness.  Musculoskeletal:     Right lower leg: No swelling.     Left lower leg: No swelling.  Skin:    General: Skin is warm.     Findings: No rash.  Neurological:     Mental Status: He is alert.     Comments: Patient able to track me.  Moving his arms.      Condition at discharge: fair  The results of significant diagnostics from this hospitalization (including imaging, microbiology, ancillary and laboratory) are listed below for reference.   Imaging Studies: CT HEAD WO CONTRAST ( ) Result Date: 08/24/2023 CLINICAL DATA:  Altered  mental status EXAM: CT HEAD WITHOUT CONTRAST TECHNIQUE: Contiguous axial images were obtained from the base of the skull through the vertex without intravenous contrast. RADIATION DOSE REDUCTION: This exam was performed according to the departmental dose-optimization program which includes automated exposure control, adjustment of the mA and/or kV according to patient size and/or use of iterative reconstruction technique. COMPARISON:  None Available. FINDINGS: Brain: There is no mass, hemorrhage or extra-axial collection. There is generalized atrophy without lobar predilection. Hypodensity of the white matter is most commonly associated with chronic microvascular disease. Vascular: Atherosclerotic calcification of the vertebral and internal carotid arteries at the skull base. No abnormal hyperdensity of the major intracranial arteries or dural venous sinuses. Skull: The visualized skull base, calvarium and extracranial soft tissues are normal. Sinuses/Orbits: No fluid levels or advanced mucosal thickening of the visualized paranasal sinuses. No mastoid or middle ear effusion. Normal orbits. Other: None. IMPRESSION: 1. No acute intracranial abnormality. 2. Generalized atrophy and findings of chronic microvascular disease. Electronically Signed   By: Juanetta Nordmann M.D.   On: 08/24/2023 19:17   CT ABDOMEN PELVIS W CONTRAST Result Date: 08/17/2023 CLINICAL DATA:  Is biliary drain biliary drain with increased output. EXAM: CT ABDOMEN AND PELVIS WITH CONTRAST TECHNIQUE: Multidetector CT imaging of the abdomen and pelvis was performed using the standard protocol following bolus administration of intravenous contrast. RADIATION DOSE REDUCTION: This exam was performed according to the departmental dose-optimization program which includes automated exposure control, adjustment of the mA and/or kV according to patient size and/or use of iterative reconstruction technique. CONTRAST:  OMNIPAQUE  IOHEXOL  300 MG/ML  SOLN  COMPARISON:  CT 07/10/2023 FINDINGS: Lower chest: Rounded pleuroparenchymal thickening in the LEFT lower lobe slightly increased from comparison exam. Hepatobiliary: Percutaneous drainage catheter with tip coiled within the lumen of the gall bladder. No fluid collections associated with the gallbladder fossa. The gallbladder is decompressed. No biliary duct dilatation. Pancreas: No pancreatic inflammation. No pancreatic fluid collections. Spleen: Normal spleen Adrenals/urinary tract: Adrenal glands and kidneys are normal. The ureters and bladder normal. There several dependent calcifications in the LEFT aspect of the bladder (image 83/3) Stomach/Bowel: Stomach, small bowel, appendix, and cecum are normal. The colon and rectosigmoid colon are normal. Vascular/Lymphatic: Is Abdominal aorta is normal caliber with atherosclerotic calcification. There is no retroperitoneal or periportal lymphadenopathy. No pelvic lymphadenopathy. Reproductive: Prostate unremarkable Other: No organized fluid collections in the abdomen pelvis. Musculoskeletal: No aggressive osseous lesion. IMPRESSION:  1. No evidence of biloma or biliary leak. 2. Cholecystostomy tube in place. No fluid collections associated with the gallbladder fossa. Gallbladder is decompressed. 3. No biliary duct dilatation. 4. Rounded pleuroparenchymal thickening in the LEFT lower lobe slightly increased from comparison exam. Findings suggest rounded pneumonia versus rounded atelectasis. 5.  Aortic Atherosclerosis (ICD10-I70.0). Electronically Signed   By: Deboraha Fallow M.D.   On: 08/17/2023 13:53   CT Head Wo Contrast Result Date: 08/17/2023 CLINICAL DATA:  Provided history: Mental status change, unknown cause. New onset altered mental status and weakness. History of Alzheimer's disease. EXAM: CT HEAD WITHOUT CONTRAST TECHNIQUE: Contiguous axial images were obtained from the base of the skull through the vertex without intravenous contrast. RADIATION DOSE  REDUCTION: This exam was performed according to the departmental dose-optimization program which includes automated exposure control, adjustment of the mA and/or kV according to patient size and/or use of iterative reconstruction technique. COMPARISON:  Head CT 11/16/2021. FINDINGS: Brain: Advanced cerebral atrophy. Commensurate prominence of the ventricles and sulci. Foci of chronic encephalomalacia/gliosis again demonstrated within the anterolateral left frontal lobe and anterior left temporal lobe. Patchy and ill-defined hypoattenuation within the cerebral white matter, nonspecific but compatible with moderate-to-advanced chronic small vessel ischemic disease. There is no acute intracranial hemorrhage. No demarcated cortical infarct. No extra-axial fluid collection. No evidence of an intracranial mass. No midline shift. Vascular: No hyperdense vessel.  Atherosclerotic calcifications. Skull: No calvarial fracture or aggressive osseous lesion. Sinuses/Orbits: No mass or acute finding within the imaged orbits. Mild mucosal thickening within the left maxillary and bilateral ethmoid sinuses at the imaged levels. IMPRESSION: 1. No evidence of an acute intracranial abnormality. 2. Unchanged foci of chronic encephalomalacia/gliosis within the anterolateral left frontal lobe and anterior left temporal lobe, which are likely posttraumatic in etiology. 3. Moderate-to-advanced cerebral white matter chronic small vessel ischemic disease. 4. Advanced cerebral atrophy. Electronically Signed   By: Bascom Lily D.O.   On: 08/17/2023 12:23   DG Chest 1 View Result Date: 08/17/2023 CLINICAL DATA:  weakness , Alzheimer's EXAM: CHEST  1 VIEW COMPARISON:  None available. FINDINGS: Low lung volumes. Retrocardiac airspace opacities. No pleural effusion or pneumothorax. The cardiac silhouette is at the upper limits of normal, likely accentuated by AP technique and low lung volumes. Tortuous aorta with aortic atherosclerosis. No acute  fracture or destructive lesions. Multilevel thoracic osteophytosis. IMPRESSION: Low lung volumes. Retrocardiac airspace opacities, may represent atelectasis or a developing bronchopneumonia, in the correct clinical context. Electronically Signed   By: Rance Burrows M.D.   On: 08/17/2023 11:52    Microbiology: Results for orders placed or performed during the hospital encounter of 08/17/23  Culture, blood (routine x 2)     Status: None   Collection Time: 08/17/23 10:30 AM   Specimen: BLOOD LEFT ARM  Result Value Ref Range Status   Specimen Description BLOOD LEFT ARM  Final   Special Requests   Final    BOTTLES DRAWN AEROBIC AND ANAEROBIC Blood Culture results may not be optimal due to an inadequate volume of blood received in culture bottles   Culture   Final    NO GROWTH 5 DAYS Performed at [facility], [address], [place], [place] [address]    Report Status 08/22/2023 FINAL  Final  Culture, blood (routine x 2)     Status: None   Collection Time: 08/17/23 10:30 AM   Specimen: BLOOD  Result Value Ref Range Status   Specimen Description BLOOD BLOOD LEFT FOREARM  Final  Special Requests   Final    BOTTLES DRAWN AEROBIC AND ANAEROBIC Blood Culture results may not be optimal due to an inadequate volume of blood received in culture bottles   Culture   Final    NO GROWTH 5 DAYS Performed at [facility], [address] [place], [place] [address]    Report Status 08/22/2023 FINAL  Final  Resp panel by RT-PCR (RSV, Flu A&B, Covid) Anterior Nasal Swab     Status: None   Collection Time: 08/17/23 12:19 PM   Specimen: Anterior Nasal Swab  Result Value Ref Range Status   SARS Coronavirus 2 by RT PCR NEGATIVE NEGATIVE Final    Comment: (NOTE) SARS-CoV-2 target nucleic acids are NOT DETECTED.  The SARS-CoV-2 RNA is generally detectable in upper respiratory specimens during the acute phase of infection. The lowest concentration of SARS-CoV-2 viral copies this assay  can detect is 138 copies/mL. A negative result does not preclude SARS-Cov-2 infection and should not be used as the sole basis for treatment or other patient management decisions. A negative result may occur with  improper specimen collection/handling, submission of specimen other than nasopharyngeal swab, presence of viral mutation(s) within the areas targeted by this assay, and inadequate number of viral copies(<138 copies/mL). A negative result must be combined with clinical observations, patient history, and epidemiological information. The expected result is Negative.  Fact Sheet for Patients:  BloggerCourse.com  Fact Sheet for Healthcare Providers:  SeriousBroker.it  This test is no t yet approved or cleared by the [place]  FDA and  has been authorized for detection and/or diagnosis of SARS-CoV-2 by FDA under an Emergency Use Authorization (EUA). This EUA will remain  in effect (meaning this test can be used) for the duration of the COVID-19 declaration under Section 564(b)(1) of the Act, 21 U.S.C.section 360bbb-3(b)(1), unless the authorization is terminated  or revoked sooner.       Influenza A by PCR NEGATIVE NEGATIVE Final   Influenza B by PCR NEGATIVE NEGATIVE Final    Comment: (NOTE) The Xpert Xpress SARS-CoV-2/FLU/RSV plus assay is intended as an aid in the diagnosis of influenza from Nasopharyngeal swab specimens and should not be used as a sole basis for treatment. Nasal washings and aspirates are unacceptable for Xpert Xpress SARS-CoV-2/FLU/RSV testing.  Fact Sheet for Patients: BloggerCourse.com  Fact Sheet for Healthcare Providers: SeriousBroker.it  This test is not yet approved or cleared by the [place]  FDA and has been authorized for detection and/or diagnosis of SARS-CoV-2 by FDA under an Emergency Use Authorization (EUA). This EUA will  remain in effect (meaning this test can be used) for the duration of the COVID-19 declaration under Section 564(b)(1) of the Act, 21 U.S.C. section 360bbb-3(b)(1), unless the authorization is terminated or revoked.     Resp Syncytial Virus by PCR NEGATIVE NEGATIVE Final    Comment: (NOTE) Fact Sheet for Patients: BloggerCourse.com  Fact Sheet for Healthcare Providers: SeriousBroker.it  This test is not yet approved or cleared by the [place]  FDA and has been authorized for detection and/or diagnosis of SARS-CoV-2 by FDA under an Emergency Use Authorization (EUA). This EUA will remain in effect (meaning this test can be used) for the duration of the COVID-19 declaration under Section 564(b)(1) of the Act, 21 U.S.C. section 360bbb-3(b)(1), unless the authorization is terminated or revoked.  Performed at [facility], [address] [place], [place] [address]     Labs: CBC: Recent Labs  Lab 08/21/23 [phone] 08/22/23 0132 08/25/23 0531 08/27/23  0119  WBC 11.8* 10.9* 9.7 10.5  HGB 10.9* 11.1* 11.1* 10.7*  HCT 35.0* 36.4* 35.3* 35.1*  MCV 89.1 90.5 89.6 92.1  PLT 305 328 379 327   Basic Metabolic Panel: Recent Labs  Lab 08/21/23 0208 08/22/23 0132 08/23/23 0353 08/25/23 0531 08/27/23 0119  NA 141 141 141 143 143  K 3.4* 3.3* 3.4* 3.4* 3.7  CL 105 105 105 110 110  CO2 25 24 26 27 24   GLUCOSE 117* 118* 137* 139* 120*  BUN 12 14 14 10 14   CREATININE 0.72 0.77 0.70 0.74 0.79  CALCIUM  8.5* 8.8* 8.7* 8.7* 8.4*  MG  --   --  2.0  --  2.1   Liver Function Tests: Recent Labs  Lab 08/25/23 0531  AST 45*  ALT 42  ALKPHOS 146*  BILITOT 0.8  PROT 7.0  ALBUMIN 2.6*   CBG: Recent Labs  Lab 08/26/23 1637 08/26/23 2055 08/27/23 0006 08/27/23 0832 08/27/23 1131  GLUCAP 127* 147* 110* 127* 175*    Discharge time spent: greater than 30 minutes. Patient high risk for cardiopulmonary arrest.  Patient  is a DNR.  High risk for aspiration and continued decline.  Will refer to hospice his home. Signed: Verla Glaze, MD [facility] 08/27/2023

## 2023-08-27 NOTE — TOC Transition Note (Signed)
 Transition of Care [facility]) - Discharge Note   Patient Details  Name: Jeff Key MRN: [id number] Date of Birth: November 29, 1945  Transition of Care [facility]) CM/SW Contact:  Crayton Docker, RN 08/27/2023, 2:42 PM   Clinical Narrative:     Discharge  orders noted for home with home health RN/HHA. [facility] is following through discharge. Patient. CM returned call to [employer], phone: [phone] regarding voicemail message. Per Debria Fang, EMS [facility] is [phone]. CM call to [employer], phone: [phone] regarding BLS transport scheduling. CM spoke to Ed. BLS transport is scheduled for 1700 today. CM alert to Dr. Clelia Current and RN [place] regarding BLS transport time of 1700. CM secure message to [facility] regarding patient's wife requests for home health aide.   Final next level of care: Skilled Nursing Facility Barriers to Discharge: Continued Medical Work up   Patient Goals and CMS Choice    Home with home health RN/HHA     Discharge Placement     Home with home health RN/HHA Outpatient Palliative Services            Discharge Plan and Services Additional resources added to the After Visit Summary for      Post Acute Care Choice: Home Health             HH Arranged: RN [facility] Agency: [facility]   Social Drivers of Health (SDOH) Interventions SDOH Screenings   Food Insecurity: Patient Unable To Answer (08/17/2023)  Housing: Patient Unable To Answer (08/17/2023)  Transportation Needs: Patient Unable To Answer (08/17/2023)  Utilities: Patient Unable To Answer (08/17/2023)  Financial Resource Strain: Low Risk  (07/13/2022)   Received from [facility], [facility]  Social Connections: Unknown (08/17/2023)  Tobacco Use: Medium Risk (08/17/2023)     Readmission Risk Interventions     No data to display

## 2023-08-27 NOTE — Plan of Care (Signed)
   Problem: Activity: Goal: Risk for activity intolerance will decrease Outcome: Not Progressing

## 2023-08-27 NOTE — TOC Progression Note (Signed)
 Transition of Care [facility]) - Progression Note    Patient Details  Name: Jeff Key MRN: [id number] Date of Birth: 1945/05/26  Transition [facility] CM/SW Contact  Crayton Docker, RN Phone Number: 08/27/2023, 11:24 AM  Clinical Narrative:     CM discussed case with Dr. Clelia Current regarding discharge care planning. Hospital day 10 days with diagnosis of Acute Metabolic Encephalopathy. EDD is today. Per Dr. Clelia Current, patient will discharge home today with home hospice. CM to patient's room regarding pending discharge and home with hospice. Patient's wife, at patient's bedside. Per patient's wife, patient prefers home with Palliative Services and to keep home health with [facility] for RN and also requesting home health aide.   CM alert to Jackie C, [facility] regarding home with hospice versus home with Palliative Services(patient wife preference). Per Karolee Paci, [facility], will follow up.   Expected Discharge Plan: Skilled Nursing Facility Barriers to Discharge: Continued Medical Work up  Expected Discharge Plan and Services    Home with hospice/[facility] [place] [facility] Acute Care Choice: Home Health Living arrangements for the past 2 months: Single Family Home                    HH Arranged: RN HH Agency: [facility]   Social Determinants of Health (SDOH) Interventions SDOH Screenings   Food Insecurity: Patient Unable To Answer (08/17/2023)  Housing: Patient Unable To Answer (08/17/2023)  Transportation Needs: Patient Unable To Answer (08/17/2023)  Utilities: Patient Unable To Answer (08/17/2023)  Financial Resource Strain: Low Risk  (07/13/2022)   Received from [facility], [facility]  Social Connections: Unknown (08/17/2023)  Tobacco Use: Medium Risk (08/17/2023)    Readmission Risk Interventions     No data to display

## 2023-08-27 NOTE — Assessment & Plan Note (Signed)
 Nystatin  powder if insurance company will cover otherwise we will have to buy over-the-counter antifungal cream.

## 2023-08-27 NOTE — Assessment & Plan Note (Signed)
 Can go back on Colazal 

## 2023-09-03 DIAGNOSIS — J188 Other pneumonia, unspecified organism: Secondary | ICD-10-CM | POA: Diagnosis not present

## 2023-09-03 DIAGNOSIS — K5909 Other constipation: Secondary | ICD-10-CM | POA: Diagnosis not present

## 2023-09-03 DIAGNOSIS — I4729 Other ventricular tachycardia: Secondary | ICD-10-CM | POA: Diagnosis not present

## 2023-09-03 DIAGNOSIS — I2510 Atherosclerotic heart disease of native coronary artery without angina pectoris: Secondary | ICD-10-CM | POA: Diagnosis not present

## 2023-09-03 DIAGNOSIS — I255 Ischemic cardiomyopathy: Secondary | ICD-10-CM | POA: Diagnosis not present

## 2023-09-03 DIAGNOSIS — R7309 Other abnormal glucose: Secondary | ICD-10-CM | POA: Diagnosis not present

## 2023-09-03 DIAGNOSIS — F0150 Vascular dementia without behavioral disturbance: Secondary | ICD-10-CM | POA: Diagnosis not present

## 2023-09-03 DIAGNOSIS — I700 Atherosclerosis of aorta: Secondary | ICD-10-CM | POA: Diagnosis not present

## 2023-09-03 DIAGNOSIS — G309 Alzheimer's disease, unspecified: Secondary | ICD-10-CM | POA: Diagnosis not present

## 2023-09-03 DIAGNOSIS — K5190 Ulcerative colitis, unspecified, without complications: Secondary | ICD-10-CM | POA: Diagnosis not present

## 2023-09-03 DIAGNOSIS — D649 Anemia, unspecified: Secondary | ICD-10-CM | POA: Diagnosis not present

## 2023-09-03 DIAGNOSIS — Z09 Encounter for follow-up examination after completed treatment for conditions other than malignant neoplasm: Secondary | ICD-10-CM | POA: Diagnosis not present

## 2023-09-04 ENCOUNTER — Other Ambulatory Visit: Payer: Self-pay | Admitting: Interventional Radiology

## 2023-09-04 MED ORDER — DEXTROSE 5 % IV SOLN: 2.0000 g | INTRAVENOUS | Status: DC

## 2023-09-04 NOTE — Progress Notes (Signed)
 Spoke with patients wife. Reminded her of patient's appointment and need to arrive @ 11 for 11:30 procedure. Wife verbalized understanding. Patient will be arriving via medical transport company.

## 2023-09-05 ENCOUNTER — Other Ambulatory Visit: Payer: Self-pay | Admitting: Radiology

## 2023-09-05 ENCOUNTER — Ambulatory Visit
Admission: RE | Admit: 2023-09-05 | Discharge: 2023-09-05 | Disposition: A | Discharge status: Home / Self Care | Source: Ambulatory Visit | Attending: Radiology | Admitting: Radiology

## 2023-09-05 DIAGNOSIS — A419 Sepsis, unspecified organism: Secondary | ICD-10-CM

## 2023-09-05 DIAGNOSIS — K810 Acute cholecystitis: Secondary | ICD-10-CM

## 2023-09-05 DIAGNOSIS — Z7401 Bed confinement status: Secondary | ICD-10-CM | POA: Diagnosis not present

## 2023-09-05 DIAGNOSIS — F0390 Unspecified dementia without behavioral disturbance: Secondary | ICD-10-CM | POA: Diagnosis not present

## 2023-09-05 DIAGNOSIS — R404 Transient alteration of awareness: Secondary | ICD-10-CM | POA: Diagnosis not present

## 2023-09-05 DIAGNOSIS — Z434 Encounter for attention to other artificial openings of digestive tract: Secondary | ICD-10-CM | POA: Diagnosis not present

## 2023-09-05 DIAGNOSIS — R29898 Other symptoms and signs involving the musculoskeletal system: Secondary | ICD-10-CM | POA: Diagnosis not present

## 2023-09-05 HISTORY — PX: IR EXCHANGE BILIARY DRAIN: IMG6046

## 2023-09-05 MED ORDER — IOHEXOL 300 MG/ML  SOLN
8.0000 mL | Freq: Once | INTRAMUSCULAR | Status: AC | PRN
  Administered 2023-09-05: 8 mL

## 2023-09-05 MED ORDER — LIDOCAINE HCL 1 % IJ SOLN
INTRAMUSCULAR | Status: AC
  Filled 2023-09-05: qty 20

## 2023-09-05 MED ORDER — LIDOCAINE HCL 1 % IJ SOLN
10.0000 mL | Freq: Once | INTRAMUSCULAR | Status: AC
  Administered 2023-09-05: 10 mL via INTRADERMAL

## 2023-09-06 ENCOUNTER — Ambulatory Visit: Admitting: Radiology

## 2023-11-07 NOTE — Progress Notes (Signed)
 Patient for IR Chole Tube Exchange on Thurs 11/08/23, I called and spoke with the patient's wife, [name] on the phone and gave pre-procedure instructions. [name] was made aware to have the patient here at 2p and check in at the new entrance. [name] stated understanding. Called 11/01/23 and 11/07/23

## 2023-11-08 ENCOUNTER — Ambulatory Visit
Admission: RE | Admit: 2023-11-08 | Discharge: 2023-11-08 | Disposition: A | Discharge status: Home / Self Care | Source: Ambulatory Visit | Attending: Interventional Radiology | Admitting: Interventional Radiology

## 2023-11-08 DIAGNOSIS — K810 Acute cholecystitis: Secondary | ICD-10-CM | POA: Diagnosis not present

## 2023-11-08 DIAGNOSIS — Z434 Encounter for attention to other artificial openings of digestive tract: Secondary | ICD-10-CM | POA: Diagnosis present

## 2023-11-08 HISTORY — PX: IR EXCHANGE BILIARY DRAIN: IMG6046

## 2023-11-08 MED ORDER — LIDOCAINE HCL 1 % IJ SOLN
INTRAMUSCULAR | Status: AC
  Filled 2023-11-08: qty 20

## 2023-11-08 MED ORDER — IOHEXOL 300 MG/ML  SOLN: 10.0000 mL | Freq: Once | INTRAMUSCULAR | Status: DC | PRN

## 2023-11-08 MED ORDER — LIDOCAINE HCL 1 % IJ SOLN: 4.0000 mL | Freq: Once | INTRAMUSCULAR | Status: DC

## 2024-01-09 ENCOUNTER — Other Ambulatory Visit: Payer: Self-pay | Admitting: Interventional Radiology

## 2024-01-09 DIAGNOSIS — K810 Acute cholecystitis: Secondary | ICD-10-CM

## 2024-01-17 ENCOUNTER — Ambulatory Visit: Admitting: Radiology

## 2024-01-17 NOTE — Progress Notes (Signed)
 Patient for IR Biliary Drain Exchange on Friday 01/18/24, I called and spoke with the patient's wife, Jeff Key on the phone and gave pre-procedure instructions. Jeff Key was made aware to be here at 1:30p and check in at the H&V entrance. Jeff Key stated understanding. Called 01/14/24

## 2024-01-18 ENCOUNTER — Ambulatory Visit
Admission: RE | Admit: 2024-01-18 | Discharge: 2024-01-18 | Disposition: A | Discharge status: Home / Self Care | Source: Ambulatory Visit | Attending: Interventional Radiology | Admitting: Interventional Radiology

## 2024-01-18 ENCOUNTER — Other Ambulatory Visit: Payer: Self-pay | Admitting: Interventional Radiology

## 2024-01-18 DIAGNOSIS — K810 Acute cholecystitis: Secondary | ICD-10-CM | POA: Insufficient documentation

## 2024-01-18 DIAGNOSIS — Z434 Encounter for attention to other artificial openings of digestive tract: Secondary | ICD-10-CM | POA: Insufficient documentation

## 2024-01-18 HISTORY — PX: IR EXCHANGE BILIARY DRAIN: IMG6046

## 2024-01-18 MED ORDER — LIDOCAINE HCL 1 % IJ SOLN
10.0000 mL | Freq: Once | INTRAMUSCULAR | Status: DC
Start: 2024-01-18 — End: 2024-01-18

## 2024-01-18 MED ORDER — LIDOCAINE-EPINEPHRINE 1 %-1:100000 IJ SOLN
10.0000 mL | Freq: Once | INTRAMUSCULAR | Status: AC
  Administered 2024-01-18: 10 mL via INTRADERMAL

## 2024-01-18 MED ORDER — IOHEXOL 300 MG/ML  SOLN
20.0000 mL | Freq: Once | INTRAMUSCULAR | Status: AC | PRN
  Administered 2024-01-18: 20 mL

## 2024-01-18 MED ORDER — LIDOCAINE-EPINEPHRINE 1 %-1:100000 IJ SOLN
INTRAMUSCULAR | Status: AC
  Filled 2024-01-18: qty 1

## 2024-03-20 DEATH — deceased
